# Patient Record
Sex: Female | Born: 1950 | Race: Black or African American | Hispanic: No | Marital: Single | Smoking: Former smoker
Health system: Southern US, Community
[De-identification: ages and names within clinical notes are randomized; demographics above are authoritative.]

## PROBLEM LIST (undated history)

## (undated) DIAGNOSIS — G459 Transient cerebral ischemic attack, unspecified: Secondary | ICD-10-CM

## (undated) DIAGNOSIS — E785 Hyperlipidemia, unspecified: Secondary | ICD-10-CM

## (undated) DIAGNOSIS — M722 Plantar fascial fibromatosis: Secondary | ICD-10-CM

## (undated) DIAGNOSIS — J8410 Pulmonary fibrosis, unspecified: Secondary | ICD-10-CM

## (undated) DIAGNOSIS — E119 Type 2 diabetes mellitus without complications: Secondary | ICD-10-CM

## (undated) DIAGNOSIS — I10 Essential (primary) hypertension: Secondary | ICD-10-CM

## (undated) DIAGNOSIS — Z860101 Personal history of adenomatous and serrated colon polyps: Secondary | ICD-10-CM

## (undated) DIAGNOSIS — R0600 Dyspnea, unspecified: Secondary | ICD-10-CM

## (undated) DIAGNOSIS — K219 Gastro-esophageal reflux disease without esophagitis: Secondary | ICD-10-CM

## (undated) DIAGNOSIS — G4730 Sleep apnea, unspecified: Secondary | ICD-10-CM

## (undated) DIAGNOSIS — N189 Chronic kidney disease, unspecified: Secondary | ICD-10-CM

## (undated) DIAGNOSIS — G609 Hereditary and idiopathic neuropathy, unspecified: Secondary | ICD-10-CM

## (undated) DIAGNOSIS — J449 Chronic obstructive pulmonary disease, unspecified: Secondary | ICD-10-CM

## (undated) DIAGNOSIS — R011 Cardiac murmur, unspecified: Secondary | ICD-10-CM

## (undated) DIAGNOSIS — M810 Age-related osteoporosis without current pathological fracture: Secondary | ICD-10-CM

## (undated) DIAGNOSIS — Z9289 Personal history of other medical treatment: Secondary | ICD-10-CM

## (undated) DIAGNOSIS — J45909 Unspecified asthma, uncomplicated: Secondary | ICD-10-CM

## (undated) DIAGNOSIS — Z86010 Personal history of colonic polyps: Secondary | ICD-10-CM

## (undated) DIAGNOSIS — D649 Anemia, unspecified: Secondary | ICD-10-CM

## (undated) HISTORY — PX: TUBAL LIGATION: SHX77

## (undated) HISTORY — PX: BLADDER SUSPENSION: SHX72

## (undated) HISTORY — PX: VEIN LIGATION AND STRIPPING: SHX2653

## (undated) HISTORY — PX: COLONOSCOPY: SHX174

## (undated) HISTORY — PX: BACK SURGERY: SHX140

## (undated) HISTORY — PX: ESOPHAGOGASTRODUODENOSCOPY: SHX1529

## (undated) HISTORY — PX: ABDOMINAL HYSTERECTOMY: SHX81

## (undated) HISTORY — PX: DILATION AND CURETTAGE OF UTERUS: SHX78

---

## 2001-07-17 ENCOUNTER — Encounter: Payer: Self-pay | Admitting: Emergency Medicine

## 2001-07-17 ENCOUNTER — Emergency Department (HOSPITAL_COMMUNITY): Admission: EM | Admit: 2001-07-17 | Discharge: 2001-07-17 | Payer: Self-pay | Admitting: Emergency Medicine

## 2004-02-26 ENCOUNTER — Other Ambulatory Visit: Payer: Self-pay

## 2004-04-02 ENCOUNTER — Other Ambulatory Visit: Payer: Self-pay

## 2004-07-22 ENCOUNTER — Emergency Department: Payer: Self-pay | Admitting: Emergency Medicine

## 2004-08-30 ENCOUNTER — Ambulatory Visit: Payer: Self-pay

## 2004-09-20 ENCOUNTER — Emergency Department: Payer: Self-pay | Admitting: Internal Medicine

## 2004-10-24 ENCOUNTER — Emergency Department: Payer: Self-pay | Admitting: Emergency Medicine

## 2004-11-20 ENCOUNTER — Emergency Department: Payer: Self-pay | Admitting: General Practice

## 2005-05-08 ENCOUNTER — Emergency Department: Payer: Self-pay | Admitting: Emergency Medicine

## 2005-05-19 ENCOUNTER — Emergency Department: Payer: Self-pay | Admitting: Unknown Physician Specialty

## 2005-06-24 ENCOUNTER — Inpatient Hospital Stay: Payer: Self-pay

## 2005-06-24 ENCOUNTER — Other Ambulatory Visit: Payer: Self-pay

## 2005-07-01 ENCOUNTER — Emergency Department: Payer: Self-pay | Admitting: Emergency Medicine

## 2005-07-02 ENCOUNTER — Other Ambulatory Visit: Payer: Self-pay

## 2005-07-02 ENCOUNTER — Emergency Department: Payer: Self-pay | Admitting: Emergency Medicine

## 2005-07-10 ENCOUNTER — Other Ambulatory Visit: Payer: Self-pay

## 2005-07-11 ENCOUNTER — Observation Stay: Payer: Self-pay | Admitting: Family Medicine

## 2005-08-24 ENCOUNTER — Emergency Department: Payer: Self-pay | Admitting: Emergency Medicine

## 2005-11-16 ENCOUNTER — Emergency Department: Payer: Self-pay | Admitting: Internal Medicine

## 2006-04-11 ENCOUNTER — Other Ambulatory Visit: Payer: Self-pay

## 2006-04-11 ENCOUNTER — Emergency Department: Payer: Self-pay | Admitting: Emergency Medicine

## 2006-05-17 ENCOUNTER — Emergency Department: Payer: Self-pay | Admitting: Emergency Medicine

## 2006-08-19 ENCOUNTER — Other Ambulatory Visit: Payer: Self-pay

## 2006-08-19 ENCOUNTER — Emergency Department: Payer: Self-pay | Admitting: Emergency Medicine

## 2007-01-01 ENCOUNTER — Emergency Department: Payer: Self-pay | Admitting: Emergency Medicine

## 2007-03-16 ENCOUNTER — Ambulatory Visit: Payer: Self-pay | Admitting: Specialist

## 2007-04-15 ENCOUNTER — Emergency Department: Payer: Self-pay | Admitting: Emergency Medicine

## 2007-05-10 ENCOUNTER — Other Ambulatory Visit: Payer: Self-pay

## 2007-05-10 ENCOUNTER — Emergency Department: Payer: Self-pay | Admitting: Emergency Medicine

## 2007-06-05 ENCOUNTER — Emergency Department: Payer: Self-pay | Admitting: Internal Medicine

## 2007-06-30 ENCOUNTER — Emergency Department: Payer: Self-pay | Admitting: Emergency Medicine

## 2007-09-03 ENCOUNTER — Ambulatory Visit: Payer: Self-pay | Admitting: Internal Medicine

## 2007-10-14 ENCOUNTER — Emergency Department: Payer: Self-pay | Admitting: Emergency Medicine

## 2007-10-14 ENCOUNTER — Other Ambulatory Visit: Payer: Self-pay

## 2008-01-11 ENCOUNTER — Other Ambulatory Visit: Payer: Self-pay

## 2008-01-11 ENCOUNTER — Emergency Department: Payer: Self-pay | Admitting: Unknown Physician Specialty

## 2008-01-23 ENCOUNTER — Emergency Department: Payer: Self-pay | Admitting: Emergency Medicine

## 2008-04-08 ENCOUNTER — Ambulatory Visit: Payer: Self-pay | Admitting: Unknown Physician Specialty

## 2008-04-19 ENCOUNTER — Ambulatory Visit: Payer: Self-pay | Admitting: Unknown Physician Specialty

## 2008-05-06 ENCOUNTER — Emergency Department: Payer: Self-pay | Admitting: Emergency Medicine

## 2009-03-28 ENCOUNTER — Ambulatory Visit: Payer: Self-pay | Admitting: Internal Medicine

## 2009-06-22 ENCOUNTER — Emergency Department: Payer: Self-pay | Admitting: Emergency Medicine

## 2009-08-01 ENCOUNTER — Ambulatory Visit: Payer: Self-pay | Admitting: Specialist

## 2009-10-24 ENCOUNTER — Ambulatory Visit: Payer: Self-pay | Admitting: Internal Medicine

## 2010-02-20 ENCOUNTER — Emergency Department: Payer: Self-pay | Admitting: Emergency Medicine

## 2010-03-02 ENCOUNTER — Emergency Department: Payer: Self-pay | Admitting: Emergency Medicine

## 2010-03-17 ENCOUNTER — Emergency Department: Payer: Self-pay | Admitting: Unknown Physician Specialty

## 2010-04-17 ENCOUNTER — Ambulatory Visit: Payer: Self-pay | Admitting: Internal Medicine

## 2011-01-10 ENCOUNTER — Emergency Department: Payer: Self-pay | Admitting: Emergency Medicine

## 2011-01-25 ENCOUNTER — Emergency Department: Payer: Self-pay | Admitting: Internal Medicine

## 2011-04-20 ENCOUNTER — Ambulatory Visit: Payer: Self-pay | Admitting: Internal Medicine

## 2011-06-19 ENCOUNTER — Ambulatory Visit: Payer: Self-pay | Admitting: Internal Medicine

## 2011-06-25 ENCOUNTER — Emergency Department: Payer: Self-pay | Admitting: Emergency Medicine

## 2011-12-10 ENCOUNTER — Emergency Department: Payer: Self-pay | Admitting: Emergency Medicine

## 2011-12-10 LAB — BASIC METABOLIC PANEL
Anion Gap: 13 (ref 7–16)
BUN: 31 mg/dL — ABNORMAL HIGH (ref 7–18)
Calcium, Total: 9.4 mg/dL (ref 8.5–10.1)
Chloride: 103 mmol/L (ref 98–107)
Co2: 26 mmol/L (ref 21–32)
Creatinine: 1.04 mg/dL (ref 0.60–1.30)
EGFR (African American): >60
EGFR (Non-African Amer.): 58 — ABNORMAL LOW
Glucose: 61 mg/dL — ABNORMAL LOW (ref 65–99)
Osmolality: 288 (ref 275–301)
Potassium: 3.4 mmol/L — ABNORMAL LOW (ref 3.5–5.1)
Sodium: 142 mmol/L (ref 136–145)

## 2011-12-10 LAB — CBC WITH DIFFERENTIAL/PLATELET
Basophil #: 0.1 10*3/uL (ref 0.0–0.1)
Basophil %: 0.6 %
Eosinophil #: 0 10*3/uL (ref 0.0–0.7)
Eosinophil %: 0.3 %
HCT: 41.5 % (ref 35.0–47.0)
HGB: 13.4 g/dL (ref 12.0–16.0)
Lymphocyte #: 2.8 10*3/uL (ref 1.0–3.6)
Lymphocyte %: 21.6 %
MCH: 29.9 pg (ref 26.0–34.0)
MCHC: 32.4 g/dL (ref 32.0–36.0)
MCV: 92 fL (ref 80–100)
Monocyte #: 0.8 10*3/uL — ABNORMAL HIGH (ref 0.0–0.7)
Monocyte %: 5.8 %
Neutrophil #: 9.3 10*3/uL — ABNORMAL HIGH (ref 1.4–6.5)
Neutrophil %: 71.7 %
Platelet: 226 10*3/uL (ref 150–440)
RBC: 4.5 10*6/uL (ref 3.80–5.20)
RDW: 14.4 % (ref 11.5–14.5)
WBC: 13 10*3/uL — ABNORMAL HIGH (ref 3.6–11.0)

## 2012-04-23 ENCOUNTER — Emergency Department: Payer: Self-pay | Admitting: Internal Medicine

## 2013-05-05 ENCOUNTER — Ambulatory Visit: Payer: Self-pay | Admitting: Internal Medicine

## 2013-06-30 ENCOUNTER — Emergency Department: Payer: Self-pay | Admitting: Internal Medicine

## 2014-03-22 DIAGNOSIS — E785 Hyperlipidemia, unspecified: Secondary | ICD-10-CM | POA: Insufficient documentation

## 2014-03-22 DIAGNOSIS — E1129 Type 2 diabetes mellitus with other diabetic kidney complication: Secondary | ICD-10-CM | POA: Insufficient documentation

## 2014-05-17 ENCOUNTER — Ambulatory Visit: Payer: Self-pay | Admitting: Internal Medicine

## 2014-09-08 DIAGNOSIS — R0600 Dyspnea, unspecified: Secondary | ICD-10-CM | POA: Insufficient documentation

## 2014-09-08 DIAGNOSIS — Z86010 Personal history of colonic polyps: Secondary | ICD-10-CM | POA: Insufficient documentation

## 2014-09-08 DIAGNOSIS — R0609 Other forms of dyspnea: Secondary | ICD-10-CM | POA: Insufficient documentation

## 2014-09-14 DIAGNOSIS — I350 Nonrheumatic aortic (valve) stenosis: Secondary | ICD-10-CM | POA: Insufficient documentation

## 2014-10-06 DIAGNOSIS — Z6841 Body Mass Index (BMI) 40.0 and over, adult: Secondary | ICD-10-CM | POA: Insufficient documentation

## 2014-12-14 ENCOUNTER — Ambulatory Visit: Payer: Self-pay | Admitting: Internal Medicine

## 2014-12-14 DIAGNOSIS — K219 Gastro-esophageal reflux disease without esophagitis: Secondary | ICD-10-CM | POA: Insufficient documentation

## 2014-12-29 ENCOUNTER — Ambulatory Visit: Payer: Self-pay | Admitting: Unknown Physician Specialty

## 2015-01-20 ENCOUNTER — Ambulatory Visit
Admit: 2015-01-20 | Disposition: A | Discharge status: Home / Self Care | Payer: Self-pay | Attending: Bariatrics | Admitting: Bariatrics

## 2015-01-21 ENCOUNTER — Ambulatory Visit
Admit: 2015-01-21 | Disposition: A | Discharge status: Home / Self Care | Payer: Self-pay | Attending: General Practice | Admitting: General Practice

## 2015-01-21 DIAGNOSIS — Z01818 Encounter for other preprocedural examination: Secondary | ICD-10-CM

## 2015-01-31 LAB — SURGICAL PATHOLOGY

## 2015-02-01 ENCOUNTER — Ambulatory Visit: Admit: 2015-02-01 | Disposition: A | Discharge status: Home / Self Care | Payer: Self-pay | Admitting: Bariatrics

## 2015-02-28 DIAGNOSIS — R7989 Other specified abnormal findings of blood chemistry: Secondary | ICD-10-CM | POA: Insufficient documentation

## 2015-04-20 ENCOUNTER — Encounter: Payer: Self-pay | Admitting: Emergency Medicine

## 2015-04-20 ENCOUNTER — Emergency Department
Admission: EM | Admit: 2015-04-20 | Discharge: 2015-04-20 | Disposition: A | Discharge status: Home / Self Care | Payer: Medicare Other | Attending: Emergency Medicine | Admitting: Emergency Medicine

## 2015-04-20 DIAGNOSIS — E119 Type 2 diabetes mellitus without complications: Secondary | ICD-10-CM | POA: Diagnosis not present

## 2015-04-20 DIAGNOSIS — I10 Essential (primary) hypertension: Secondary | ICD-10-CM | POA: Diagnosis not present

## 2015-04-20 DIAGNOSIS — M5410 Radiculopathy, site unspecified: Secondary | ICD-10-CM | POA: Insufficient documentation

## 2015-04-20 DIAGNOSIS — Z87891 Personal history of nicotine dependence: Secondary | ICD-10-CM | POA: Insufficient documentation

## 2015-04-20 DIAGNOSIS — M545 Low back pain: Secondary | ICD-10-CM | POA: Diagnosis present

## 2015-04-20 HISTORY — DX: Type 2 diabetes mellitus without complications: E11.9

## 2015-04-20 HISTORY — DX: Essential (primary) hypertension: I10

## 2015-04-20 HISTORY — DX: Unspecified asthma, uncomplicated: J45.909

## 2015-04-20 HISTORY — DX: Gastro-esophageal reflux disease without esophagitis: K21.9

## 2015-04-20 LAB — GLUCOSE, CAPILLARY: Glucose-Capillary: 163 mg/dL — ABNORMAL HIGH (ref 65–99)

## 2015-04-20 MED ORDER — PREDNISONE 10 MG PO TABS: ORAL_TABLET | ORAL | Status: DC

## 2015-04-20 NOTE — Discharge Instructions (Signed)
° °  FOLLOW UP WITH YOUR DOCTOR IF ANY CONTINUED PROBLEMS  KEEP YOUR APPOINTMENT WITH DR. Marland Kitchen

## 2015-04-20 NOTE — ED Notes (Signed)
C/o back pain and stomach pain, stomach pain comes and goes, back has been hurting for several weeks.

## 2015-04-20 NOTE — ED Provider Notes (Signed)
[facility], [place] Emergency Department Provider Note  ____________________________________________  Time seen: 1427  I have reviewed the triage vital signs and the nursing notes.   HISTORY  Chief Complaint Back Pain   HPI [name] [name] is a 64 y.o. female is here today with complaint of low back pain. She states this is been hurting for several weeks. The pain is now going down her right leg. She denies any bowel or bladder difficulties.She's had problems in the past with her back and apparently went for an MRI. She states that she is unaware of what the MRI showed that she knows she is has an appointment scheduled with Dr. Marland Kitchen for cortisone injections in her back. Currently she takes prednisone 10 mg daily for her asthma. She states that for the last several days she is increased her prednisone to 20 mg hoping that it would help with pain. Standing and walking increases her pain down her right leg. Nothing seems to be helping.   Past Medical History  Diagnosis Date  . Hypertension   . Diabetes mellitus without complication   . GERD (gastroesophageal reflux disease)   . Asthma     There are no active problems to display for this patient.   Past Surgical History  Procedure Laterality Date  . Abdominal hysterectomy    . Cesarean section      Current Outpatient Rx  Name  Route  Sig  Dispense  Refill  . predniSONE (DELTASONE) 10 MG tablet      Take 6 tablets  today, on day 2 take 5 tablets, day 3 take 4 tablets, day 4 take 3 tablets, day 5 take  2 tablets and 1 tablet the last day   21 tablet   0     Allergies Codeine  History reviewed. No pertinent family history.  Social History History  Substance Use Topics  . Smoking status: Former Research scientist (life sciences)  . Smokeless tobacco: Not on file  . Alcohol Use: No    Review of Systems Constitutional: No fever/chills Cardiovascular: Denies chest pain. Respiratory: Denies shortness of  breath. Gastrointestinal: No abdominal pain.  No nausea, no vomiting. Genitourinary: Negative for dysuria. Musculoskeletal: Positive for back pain. Skin: Negative for rash. Neurological: Negative for headaches, no focal weaknes , positive numbness radiating down right leg.  10-point ROS otherwise negative.  ____________________________________________   PHYSICAL EXAM:  VITAL SIGNS: ED Triage Vitals  Enc Vitals Group     BP 04/20/15 1337 142/78 mmHg     Pulse Rate 04/20/15 1337 95     Resp 04/20/15 1337 18     Temp 04/20/15 1337 97.7 F (36.5 C)     Temp Source 04/20/15 1337 Oral     SpO2 04/20/15 1337 100 %     Weight 04/20/15 1337 223 lb (101.152 kg)     Height 04/20/15 1337 4\' 11"  (1.499 m)     Head Cir --      Peak Flow --      Pain Score --      Pain Loc --      Pain Edu? --      Excl. in [place]? --     Constitutional: Alert and oriented. Well appearing and in no acute distress. Eyes: Conjunctivae are normal. PERRL. EOMI. Head: Atraumatic. Nose: No congestion/rhinnorhea. Neck: No stridor.   Cardiovascular: Normal rate, regular rhythm. Grossly normal heart sounds.  Good peripheral circulation. Respiratory: Normal respiratory effort.  No retractions. Lungs CTAB. Gastrointestinal: Soft and nontender. No  distention. No CVA tenderness. Musculoskeletal: No lower extremity tenderness nor edema.  No joint effusions. Neurologic:  Normal speech and language. No gross focal neurologic deficits are appreciated. No gait instability. Skin:  Skin is warm, dry and intact. No rash noted. Psychiatric: Mood and affect are normal. Speech and behavior are normal.  ____________________________________________   LABS (all labs ordered are listed, but only abnormal results are displayed)  Labs Reviewed  GLUCOSE, CAPILLARY - Abnormal; Notable for the following:    Glucose-Capillary 163 (*)    All other components within normal limits  CBG MONITORING, ED      PROCEDURES  Procedure(s) performed: None  Critical Care performed: No  ____________________________________________   INITIAL IMPRESSION / ASSESSMENT AND PLAN / ED COURSE  Pertinent labs & imaging results that were available during my care of the patient were reviewed by me and considered in my medical decision making (see chart for details).  Patient is a very taking prednisone 20 mg we discussed increasing her to 60 mg with a tapering dose over the next 6 days. She is to call her doctor if any continued problems. She is also to keep her appointment with Dr. Marland Kitchen ____________________________________________   FINAL CLINICAL IMPRESSION(S) / ED DIAGNOSES  Final diagnoses:  Sciatica neuralgia, right      Johnn Hai, PA-C 04/20/15 1546  Delman Kitten, MD 04/20/15 1558

## 2015-04-20 NOTE — ED Notes (Signed)
C/o right low back pain past few weeks

## 2015-05-01 ENCOUNTER — Encounter: Payer: Self-pay | Admitting: *Deleted

## 2015-05-01 ENCOUNTER — Emergency Department
Admission: EM | Admit: 2015-05-01 | Discharge: 2015-05-01 | Disposition: A | Discharge status: Home / Self Care | Payer: Medicare Other | Attending: Emergency Medicine | Admitting: Emergency Medicine

## 2015-05-01 ENCOUNTER — Emergency Department: Payer: Medicare Other

## 2015-05-01 DIAGNOSIS — I10 Essential (primary) hypertension: Secondary | ICD-10-CM | POA: Diagnosis not present

## 2015-05-01 DIAGNOSIS — E119 Type 2 diabetes mellitus without complications: Secondary | ICD-10-CM | POA: Insufficient documentation

## 2015-05-01 DIAGNOSIS — M25552 Pain in left hip: Secondary | ICD-10-CM

## 2015-05-01 DIAGNOSIS — Z87891 Personal history of nicotine dependence: Secondary | ICD-10-CM | POA: Diagnosis not present

## 2015-05-01 MED ORDER — OXYCODONE-ACETAMINOPHEN 5-325 MG PO TABS: 1.0000 | ORAL_TABLET | Freq: Four times a day (QID) | ORAL | Status: DC | PRN

## 2015-05-01 MED ORDER — PREDNISONE 10 MG (21) PO TBPK: ORAL_TABLET | ORAL | Status: DC

## 2015-05-01 NOTE — ED Notes (Signed)
Pt points to left hip and says she has a lot of pain there. Pt denies injury. Pt also states it hurts a lot when she reaches to wipe herself.

## 2015-05-01 NOTE — Discharge Instructions (Signed)
Hip Pain Your hip is the joint between your upper legs and your lower pelvis. The bones, cartilage, tendons, and muscles of your hip joint perform a lot of work each day supporting your body weight and allowing you to move around. Hip pain can range from a minor ache to severe pain in one or both of your hips. Pain may be felt on the inside of the hip joint near the groin, or the outside near the buttocks and upper thigh. You may have swelling or stiffness as well.  HOME CARE INSTRUCTIONS   Take medicines only as directed by your health care provider.  Apply ice to the injured area:  Put ice in a plastic bag.  Place a towel between your skin and the bag.  Leave the ice on for 15-20 minutes at a time, 3-4 times a day.  Keep your leg raised (elevated) when possible to lessen swelling.  Avoid activities that cause pain.  Follow specific exercises as directed by your health care provider.  Sleep with a pillow between your legs on your most comfortable side.  Record how often you have hip pain, the location of the pain, and what it feels like. SEEK MEDICAL CARE IF:   You are unable to put weight on your leg.  Your hip is red or swollen or very tender to touch.  Your pain or swelling continues or worsens after 1 week.  You have increasing difficulty walking.  You have a fever. SEEK IMMEDIATE MEDICAL CARE IF:   You have fallen.  You have a sudden increase in pain and swelling in your hip. MAKE SURE YOU:   Understand these instructions.  Will watch your condition.  Will get help right away if you are not doing well or get worse. Document Released: 03/14/2010 Document Revised: 02/08/2014 Document Reviewed: 05/21/2013 [facility] Patient Information 2015 [place], [place]. This information is not intended to replace advice given to you by your health care provider. Make sure you discuss any questions you have with your health care provider.   Take prednisone and pain medicine as  directed. Contact your physician to schedule follow-up appointment. Return for any worsening symptoms.

## 2015-05-01 NOTE — ED Notes (Signed)
Pt also states at times she has a lot of pain in left leg from thigh to ankle/.

## 2015-05-01 NOTE — ED Provider Notes (Signed)
[facility] Emergency Department Provider Note  ____________________________________________  Time seen: Approximately 11:40 AM  I have reviewed the triage vital signs and the nursing notes.   HISTORY  Chief Complaint Hip Pain    HPI [name] [name] is a 64 y.o. female with history of asthma, chronic prednisone use who presents with left groin pain worse last couple days. She has history of degenerative changes in the spine and is followed by a back specialist. She was recently seen in the emergency department, and treated for sciatica with improvement. She took 30 mg of prednisone yesterday and 20mg  today for her pain. Pain is worse with movement. No new leg swelling. No nausea or vomiting, abdominal pain. She reports regular bowel movements.   Past Medical History  Diagnosis Date  . Hypertension   . Diabetes mellitus without complication   . GERD (gastroesophageal reflux disease)   . Asthma     There are no active problems to display for this patient.   Past Surgical History  Procedure Laterality Date  . Abdominal hysterectomy    . Cesarean section      Current Outpatient Rx  Name  Route  Sig  Dispense  Refill  . predniSONE (DELTASONE) 10 MG tablet      Take 6 tablets  today, on day 2 take 5 tablets, day 3 take 4 tablets, day 4 take 3 tablets, day 5 take  2 tablets and 1 tablet the last day   21 tablet   0     Allergies Codeine  No family history on file.  Social History History  Substance Use Topics  . Smoking status: Former Research scientist (life sciences)  . Smokeless tobacco: Not on file  . Alcohol Use: No    Review of Systems Constitutional: No fever/chills Eyes: No visual changes. ENT: No sore throat. Cardiovascular: Denies chest pain. Respiratory: Denies shortness of breath. Gastrointestinal: No abdominal pain.  No nausea, no vomiting.  No diarrhea.  No constipation. Genitourinary: Negative for dysuria. Musculoskeletal: back pain with radiation  to the right foot. Skin: Negative for rash. Neurological: Negative for headaches, focal weakness or numbness.  10-point ROS otherwise negative.  ____________________________________________   PHYSICAL EXAM:  VITAL SIGNS: ED Triage Vitals  Enc Vitals Group     BP 05/01/15 0948 150/95 mmHg     Pulse Rate 05/01/15 0948 83     Resp 05/01/15 0948 20     Temp 05/01/15 0948 98 F (36.7 C)     Temp Source 05/01/15 0948 Oral     SpO2 05/01/15 0948 98 %     Weight 05/01/15 0948 224 lb (101.606 kg)     Height 05/01/15 0948 4\' 11"  (1.499 m)     Head Cir --      Peak Flow --      Pain Score 05/01/15 0950 10     Pain Loc --      Pain Edu? --      Excl. in [place]? --     Constitutional: Alert and oriented. Well appearing and in no acute distress. Eyes: Conjunctivae are normal. PERRL. EOMI. Head: Atraumatic. Nose: No congestion/rhinnorhea. Mouth/Throat: Mucous membranes are moist.  Oropharynx non-erythematous. Neck: supple Cardiovascular: Normal rate, regular rhythm. Grossly normal heart sounds.  Good peripheral circulation. Respiratory: Normal respiratory effort.  No retractions. Lungs CTAB. Gastrointestinal: Soft and nontender. No distention. No abdominal bruits. No CVA tenderness. Musculoskeletal: No lower extremity tenderness nor edema.  No joint effusions. Tender to the left trochanteric bursa, and  left groin with pain on external rotation. Negative/positive straight leg raise on the left Extremities no edema. 2+ DP pulses Neurologic:  Normal speech and language. No gross focal neurologic deficits are appreciated. No gait instability. Skin:  Skin is warm, dry and intact. No rash noted. Psychiatric: Mood and affect are normal. Speech and behavior are normal.  ____________________________________________   LABS (all labs ordered are listed, but only abnormal results are displayed)  Labs Reviewed - No data to  display ____________________________________________  EKG   ____________________________________________  RADIOLOGY      CLINICAL DATA: Sharp left hip pain with weight-bearing for 3 days. No known injury. Initial encounter.  EXAM: DG HIP (WITH OR WITHOUT PELVIS) 1V*L*  COMPARISON: None.  FINDINGS: No acute bony or joint abnormality is identified. There is no evidence of femoral head avascular necrosis. Lower lumbar spondylosis is noted. The sacroiliac joints and symphysis pubis are unremarkable.  IMPRESSION: Normal-appearing left hip.  Lower lumbar spondylosis.   Electronically Signed  By: Inge Rise M.D.  On: 05/01/2015 11:38    ____________________________________________   PROCEDURES  Procedure(s) performed: None  Critical Care performed: No  ____________________________________________   INITIAL IMPRESSION / ASSESSMENT AND PLAN / ED COURSE  Pertinent labs & imaging results that were available during my care of the patient were reviewed by me and considered in my medical decision making (see chart for details).  64 year old with known lumbar degenerative disease who presents with left hip pain. X-ray does not suggest avascular necrosis. Consider left groin strain vs lumbar radiculopathy vs trochanteric bursitis. She will begin a 60 mg prednisone taper and contact her physician for follow-up. She also sees Dr. Sharlet Salina in physiatry for her degenerative lumbar disease. Return to the emergency room for any worsening symptoms. She is also given Percocet, #20 ____________________________________________   FINAL CLINICAL IMPRESSION(S) / ED DIAGNOSES  Final diagnoses:  None      Mortimer Fries, PA-C 05/01/15 [place], MD 05/01/15 [phone]

## 2015-05-01 NOTE — ED Notes (Signed)
NAD noted at time of D/C. Pt ambulatory to the lobby at this time. Pt refused wheelchair.

## 2015-05-24 ENCOUNTER — Emergency Department
Admission: EM | Admit: 2015-05-24 | Discharge: 2015-05-24 | Disposition: A | Discharge status: Home / Self Care | Payer: Medicare Other | Attending: Emergency Medicine | Admitting: Emergency Medicine

## 2015-05-24 ENCOUNTER — Emergency Department: Payer: Medicare Other

## 2015-05-24 ENCOUNTER — Encounter: Payer: Self-pay | Admitting: Emergency Medicine

## 2015-05-24 DIAGNOSIS — I10 Essential (primary) hypertension: Secondary | ICD-10-CM | POA: Insufficient documentation

## 2015-05-24 DIAGNOSIS — M79605 Pain in left leg: Secondary | ICD-10-CM | POA: Diagnosis not present

## 2015-05-24 DIAGNOSIS — M25551 Pain in right hip: Secondary | ICD-10-CM | POA: Diagnosis present

## 2015-05-24 DIAGNOSIS — E119 Type 2 diabetes mellitus without complications: Secondary | ICD-10-CM | POA: Diagnosis not present

## 2015-05-24 DIAGNOSIS — M545 Low back pain, unspecified: Secondary | ICD-10-CM

## 2015-05-24 DIAGNOSIS — M25552 Pain in left hip: Secondary | ICD-10-CM | POA: Insufficient documentation

## 2015-05-24 DIAGNOSIS — M79604 Pain in right leg: Secondary | ICD-10-CM | POA: Insufficient documentation

## 2015-05-24 DIAGNOSIS — M5136 Other intervertebral disc degeneration, lumbar region: Secondary | ICD-10-CM | POA: Insufficient documentation

## 2015-05-24 LAB — COMPREHENSIVE METABOLIC PANEL
ALBUMIN: 3.6 g/dL (ref 3.5–5.0)
ALK PHOS: 65 U/L (ref 38–126)
ALT: 19 U/L (ref 14–54)
AST: 28 U/L (ref 15–41)
Anion gap: 8 (ref 5–15)
BUN: 21 mg/dL — AB (ref 6–20)
CO2: 29 mmol/L (ref 22–32)
CREATININE: 1.38 mg/dL — AB (ref 0.44–1.00)
Calcium: 8.9 mg/dL (ref 8.9–10.3)
Chloride: 105 mmol/L (ref 101–111)
GFR calc Af Amer: 47 mL/min — ABNORMAL LOW (ref >60)
GFR, EST NON AFRICAN AMERICAN: 40 mL/min — AB (ref >60)
GLUCOSE: 193 mg/dL — AB (ref 65–99)
Potassium: 3.7 mmol/L (ref 3.5–5.1)
Sodium: 142 mmol/L (ref 135–145)
Total Bilirubin: 0.6 mg/dL (ref 0.3–1.2)
Total Protein: 6.7 g/dL (ref 6.5–8.1)

## 2015-05-24 LAB — CBC WITH DIFFERENTIAL/PLATELET
BASOS ABS: 0.1 10*3/uL (ref 0–0.1)
Basophils Relative: 1 %
EOS ABS: 0.1 10*3/uL (ref 0–0.7)
HCT: 43.5 % (ref 35.0–47.0)
Hemoglobin: 14.4 g/dL (ref 12.0–16.0)
Lymphs Abs: 1.4 10*3/uL (ref 1.0–3.6)
MCH: 29.8 pg (ref 26.0–34.0)
MCHC: 33 g/dL (ref 32.0–36.0)
MCV: 90.1 fL (ref 80.0–100.0)
MONO ABS: 0.7 10*3/uL (ref 0.2–0.9)
Monocytes Relative: 8 %
Neutro Abs: 6 10*3/uL (ref 1.4–6.5)
Neutrophils Relative %: 72 %
PLATELETS: 190 10*3/uL (ref 150–440)
RBC: 4.83 MIL/uL (ref 3.80–5.20)
RDW: 15.3 % — AB (ref 11.5–14.5)
WBC: 8.3 10*3/uL (ref 3.6–11.0)

## 2015-05-24 MED ORDER — DIAZEPAM 5 MG PO TABS: 5.0000 mg | ORAL_TABLET | Freq: Three times a day (TID) | ORAL | Status: AC | PRN

## 2015-05-24 MED ORDER — GABAPENTIN 100 MG PO CAPS: 100.0000 mg | ORAL_CAPSULE | Freq: Three times a day (TID) | ORAL | Status: DC

## 2015-05-24 NOTE — ED Provider Notes (Signed)
[facility] Emergency Department Provider Note  ____________________________________________  Time seen: Approximately 10:23 AM  I have reviewed the triage vital signs and the nursing notes.   HISTORY  Chief Complaint Leg Pain    HPI [name] [name] is a 64 y.o. female who presents for continued bilateral hip and leg pain for several months. Patient states that her legs just hurt all the time she continued same without answers. Her PCP does not appear to be helping her according to her report. She has appointment at [facility] on 29th of September for evaluation. Wants [place] to figure out what's wrong with her.   Past Medical History  Diagnosis Date  . Hypertension   . Diabetes mellitus without complication   . GERD (gastroesophageal reflux disease)   . Asthma     There are no active problems to display for this patient.   Past Surgical History  Procedure Laterality Date  . Abdominal hysterectomy    . Cesarean section      Current Outpatient Rx  Name  Route  Sig  Dispense  Refill  . diazepam (VALIUM) 5 MG tablet   Oral   Take 1 tablet (5 mg total) by mouth every 8 (eight) hours as needed for muscle spasms.   14 tablet   0   . gabapentin (NEURONTIN) 100 MG capsule   Oral   Take 1 capsule (100 mg total) by mouth 3 (three) times daily. Maximum 3 pills 3 times daily.   90 capsule   0   . oxyCODONE-acetaminophen (ROXICET) 5-325 MG per tablet   Oral   Take 1 tablet by mouth every 6 (six) hours as needed.   20 tablet   0   . predniSONE (STERAPRED UNI-PAK 21 TAB) 10 MG (21) TBPK tablet      6 tablets on day 1, 5 tablets on day 2, 4 tablets on day 3, etc...   21 tablet   0     Allergies Codeine  History reviewed. No pertinent family history.  Social History Social History  Substance Use Topics  . Smoking status: Former Research scientist (life sciences)  . Smokeless tobacco: None  . Alcohol Use: No    Review of Systems Constitutional: No fever/chills Eyes: No  visual changes. ENT: No sore throat. Cardiovascular: Denies chest pain. Respiratory: Denies shortness of breath. Gastrointestinal: No abdominal pain.  No nausea, no vomiting.  No diarrhea.  No constipation. Genitourinary: Negative for dysuria. Musculoskeletal: Positive for low back pain. Positive for tenderness to the entire legs bilaterally from hip to toes. States it hurts to touch both sides. Patient is sensitive to even light palpation. Distal pulses neurovascularly intact capillary refills good on both feet. Straight leg positive subjectively. Skin: Negative for rash. Neurological: Negative for headaches, focal weakness or numbness.  10-point ROS otherwise negative.  ____________________________________________   PHYSICAL EXAM:  VITAL SIGNS: ED Triage Vitals  Enc Vitals Group     BP 05/24/15 0950 130/80 mmHg     Pulse Rate 05/24/15 0950 95     Resp 05/24/15 0950 18     Temp 05/24/15 0950 97.8 F (36.6 C)     Temp Source 05/24/15 0950 Oral     SpO2 05/24/15 0950 98 %     Weight 05/24/15 0950 223 lb (101.152 kg)     Height 05/24/15 0950 4\' 11"  (1.499 m)     Head Cir --      Peak Flow --      Pain Score 05/24/15 0951 10  Pain Loc --      Pain Edu? --      Excl. in [place]? --     Constitutional: Alert and oriented. Well appearing and in no acute distress. Eyes: Conjunctivae are normal. PERRL. EOMI. Head: Atraumatic. Nose: No congestion/rhinnorhea. Mouth/Throat: Mucous membranes are moist.  Oropharynx non-erythematous. Neck: No stridor.   Cardiovascular: Normal rate, regular rhythm. Grossly normal heart sounds.  Good peripheral circulation. Respiratory: Normal respiratory effort.  No retractions. Lungs CTAB. Gastrointestinal: Soft anPositive for tenderness to the entire legs bilaterally from hip to toes. States it hurts to touch both sides. Patient is sensitive to even light palpation. Distal pulses neurovascularly intact capillary refills good on both feet. Straight leg  positive subjectively.d nontender. No distention. No abdominal bruits. No CVA tenderness. Musculoskeletal: No lower extremity tenderness nor edema.  No joint effusions. Neurologic:  Normal speech and language. No gross focal neurologic deficits are appreciated. No gait instability. Skin:  Skin is warm, dry and intact. No rash noted. Psychiatric: Mood and affect are normal. Speech and behavior are normal.  ____________________________________________   LABS (all labs ordered are listed, but only abnormal results are displayed)  Labs Reviewed  COMPREHENSIVE METABOLIC PANEL  CBC WITH DIFFERENTIAL/PLATELET   ____________________________________________  RADIOLOGY  CT lumbar spine severe degenerative disc disease ____________________________________________   PROCEDURES  Procedure(s) performed: None  Critical Care performed: No  ____________________________________________   INITIAL IMPRESSION / ASSESSMENT AND PLAN / ED COURSE  Pertinent labs & imaging results that were available during my care of the patient were reviewed by me and considered in my medical decision making (see chart for details).  Start patient on gabapentin for pain control and referred back to her PCP. She is encouraged to keep her appointment on September 29. She voices no other emergency medical complaints at this time ____________________________________________   FINAL CLINICAL IMPRESSION(S) / ED DIAGNOSES  Final diagnoses:  Low back pain at multiple sites  Degenerative disc disease, lumbar      Arlyss Repress, PA-C 05/24/15 1405  Delman Kitten, MD 05/25/15 2236

## 2015-05-24 NOTE — ED Notes (Signed)
Has chronic leg pain

## 2015-05-24 NOTE — ED Notes (Signed)
Pt to ed with c/o bilat hip and leg pain x several months,  Pt states her legs hurt all the time.  Pt reports she has been seen here for the same without answers.  Pt states appt at [facility] on Sept 29th for same.  Pt reports PMD is dr Ginette Pitman "but he don't do anything for me"  Pt states " I know something is wrong with my legs and I want ya'll to figure out what is wrong."

## 2015-05-24 NOTE — Discharge Instructions (Signed)
Chronic Back Pain  When back pain lasts longer than 3 months, it is called chronic back pain.People with chronic back pain often go through certain periods that are more intense (flare-ups).  CAUSES Chronic back pain can be caused by wear and tear (degeneration) on different structures in your back. These structures include:  The bones of your spine (vertebrae) and the joints surrounding your spinal cord and nerve roots (facets).  The strong, fibrous tissues that connect your vertebrae (ligaments). Degeneration of these structures may result in pressure on your nerves. This can lead to constant pain. HOME CARE INSTRUCTIONS  Avoid bending, heavy lifting, prolonged sitting, and activities which make the problem worse.  Take brief periods of rest throughout the day to reduce your pain. Lying down or standing usually is better than sitting while you are resting.  Take over-the-counter or prescription medicines only as directed by your caregiver. SEEK IMMEDIATE MEDICAL CARE IF:   You have weakness or numbness in one of your legs or feet.  You have trouble controlling your bladder or bowels.  You have nausea, vomiting, abdominal pain, shortness of breath, or fainting. Document Released: 11/01/2004 Document Revised: 12/17/2011 Document Reviewed: 09/08/2011 [facility] Patient Information 2015 [place], [place]. This information is not intended to replace advice given to you by your health care provider. Make sure you discuss any questions you have with your health care provider.  Degenerative Disk Disease Degenerative disk disease is a condition caused by the changes that occur in the cushions of the backbone (spinal disks) as you grow older. Spinal disks are soft and compressible disks located between the bones of the spine (vertebrae). They act like shock absorbers. Degenerative disk disease can affect the whole spine. However, the neck and lower back are most commonly affected. Many changes can  occur in the spinal disks with aging, such as:  The spinal disks may dry and shrink.  Small tears may occur in the tough, outer covering of the disk (annulus).  The disk space may become smaller due to loss of water.  Abnormal growths in the bone (spurs) may occur. This can put pressure on the nerve roots exiting the spinal canal, causing pain.  The spinal canal may become narrowed. CAUSES  Degenerative disk disease is a condition caused by the changes that occur in the spinal disks with aging. The exact cause is not known, but there is a genetic basis for many patients. Degenerative changes can occur due to loss of fluid in the disk. This makes the disk thinner and reduces the space between the backbones. Small cracks can develop in the outer layer of the disk. This can lead to the breakdown of the disk. You are more likely to get degenerative disk disease if you are overweight. Smoking cigarettes and doing heavy work such as weightlifting can also increase your risk of this condition. Degenerative changes can start after a sudden injury. Growth of bone spurs can compress the nerve roots and cause pain.  SYMPTOMS  The symptoms vary from person to person. Some people may have no pain, while others have severe pain. The pain may be so severe that it can limit your activities. The location of the pain depends on the part of your backbone that is affected. You will have neck or arm pain if a disk in the neck area is affected. You will have pain in your back, buttocks, or legs if a disk in the lower back is affected. The pain becomes worse while bending, reaching  up, or with twisting movements. The pain may start gradually and then get worse as time passes. It may also start after a major or minor injury. You may feel numbness or tingling in the arms or legs.  DIAGNOSIS  Your caregiver will ask you about your symptoms and about activities or habits that may cause the pain. He or she may also ask about  any injuries, diseases, or treatments you have had earlier. Your caregiver will examine you to check for the range of movement that is possible in the affected area, to check for strength in your extremities, and to check for sensation in the areas of the arms and legs supplied by different nerve roots. An X-ray of the spine may be taken. Your caregiver may suggest other imaging tests, such as magnetic resonance imaging (MRI), if needed.  TREATMENT  Treatment includes rest, modifying your activities, and applying ice and heat. Your caregiver may prescribe medicines to reduce your pain and may ask you to do some exercises to strengthen your back. In some cases, you may need surgery. You and your caregiver will decide on the treatment that is best for you. HOME CARE INSTRUCTIONS   Follow proper lifting and walking techniques as advised by your caregiver.  Maintain good posture.  Exercise regularly as advised.  Perform relaxation exercises.  Change your sitting, standing, and sleeping habits as advised. Change positions frequently.  Lose weight as advised.  Stop smoking if you smoke.  Wear supportive footwear. SEEK MEDICAL CARE IF:  Your pain does not go away within 1 to 4 weeks. SEEK IMMEDIATE MEDICAL CARE IF:   Your pain is severe.  You notice weakness in your arms, hands, or legs.  You begin to lose control of your bladder or bowel movements. MAKE SURE YOU:   Understand these instructions.  Will watch your condition.  Will get help right away if you are not doing well or get worse. Document Released: 07/22/2007 Document Revised: 12/17/2011 Document Reviewed: 01/26/2014 [facility] Patient Information 2015 [place], [place]. This information is not intended to replace advice given to you by your health care provider. Make sure you discuss any questions you have with your health care provider.

## 2015-07-22 ENCOUNTER — Encounter: Payer: Self-pay | Admitting: Emergency Medicine

## 2015-07-22 ENCOUNTER — Emergency Department: Payer: Medicare Other

## 2015-07-22 ENCOUNTER — Emergency Department
Admission: EM | Admit: 2015-07-22 | Discharge: 2015-07-23 | Disposition: A | Discharge status: Home / Self Care | Payer: Medicare Other | Attending: Emergency Medicine | Admitting: Emergency Medicine

## 2015-07-22 DIAGNOSIS — E119 Type 2 diabetes mellitus without complications: Secondary | ICD-10-CM | POA: Insufficient documentation

## 2015-07-22 DIAGNOSIS — W01198A Fall on same level from slipping, tripping and stumbling with subsequent striking against other object, initial encounter: Secondary | ICD-10-CM | POA: Diagnosis not present

## 2015-07-22 DIAGNOSIS — Y92009 Unspecified place in unspecified non-institutional (private) residence as the place of occurrence of the external cause: Secondary | ICD-10-CM | POA: Diagnosis not present

## 2015-07-22 DIAGNOSIS — Z87891 Personal history of nicotine dependence: Secondary | ICD-10-CM | POA: Insufficient documentation

## 2015-07-22 DIAGNOSIS — Z7984 Long term (current) use of oral hypoglycemic drugs: Secondary | ICD-10-CM | POA: Insufficient documentation

## 2015-07-22 DIAGNOSIS — I10 Essential (primary) hypertension: Secondary | ICD-10-CM | POA: Diagnosis not present

## 2015-07-22 DIAGNOSIS — Y9389 Activity, other specified: Secondary | ICD-10-CM | POA: Insufficient documentation

## 2015-07-22 DIAGNOSIS — S42292A Other displaced fracture of upper end of left humerus, initial encounter for closed fracture: Secondary | ICD-10-CM | POA: Diagnosis not present

## 2015-07-22 DIAGNOSIS — Y998 Other external cause status: Secondary | ICD-10-CM | POA: Diagnosis not present

## 2015-07-22 DIAGNOSIS — S4992XA Unspecified injury of left shoulder and upper arm, initial encounter: Secondary | ICD-10-CM | POA: Diagnosis present

## 2015-07-22 DIAGNOSIS — Z791 Long term (current) use of non-steroidal anti-inflammatories (NSAID): Secondary | ICD-10-CM | POA: Insufficient documentation

## 2015-07-22 DIAGNOSIS — Z79899 Other long term (current) drug therapy: Secondary | ICD-10-CM | POA: Insufficient documentation

## 2015-07-22 DIAGNOSIS — S42302A Unspecified fracture of shaft of humerus, left arm, initial encounter for closed fracture: Secondary | ICD-10-CM

## 2015-07-22 DIAGNOSIS — W19XXXA Unspecified fall, initial encounter: Secondary | ICD-10-CM

## 2015-07-22 MED ORDER — MORPHINE SULFATE (PF) 4 MG/ML IV SOLN
4.0000 mg | Freq: Once | INTRAVENOUS | Status: AC
  Administered 2015-07-22: 4 mg via INTRAVENOUS

## 2015-07-22 MED ORDER — ONDANSETRON HCL 4 MG/2ML IJ SOLN
4.0000 mg | Freq: Once | INTRAMUSCULAR | Status: AC
  Administered 2015-07-22: 4 mg via INTRAVENOUS

## 2015-07-22 MED ORDER — ONDANSETRON HCL 4 MG/2ML IJ SOLN
INTRAMUSCULAR | Status: AC
  Administered 2015-07-22: 4 mg via INTRAVENOUS
  Filled 2015-07-22: qty 2

## 2015-07-22 MED ORDER — MORPHINE SULFATE (PF) 4 MG/ML IV SOLN
INTRAVENOUS | Status: AC
  Administered 2015-07-22: 4 mg via INTRAVENOUS
  Filled 2015-07-22: qty 1

## 2015-07-22 NOTE — ED Notes (Signed)
Pt. States she tripped on rug in house.  Pt. Hit lt. Arm on the way down to floor.  Pt. Has some hemorrhage to rt. Eye.  Pt. Denies LOC.

## 2015-07-22 NOTE — ED Notes (Signed)
Pt. States she tripped on a rug at home and hit lt. Forearm on wall.  Pt. States pain to lt. Shoulder and forearm.  Pt. Denies LOC.  Pt. Has hemorrhage to rt. Eye.

## 2015-07-23 ENCOUNTER — Emergency Department: Payer: Medicare Other

## 2015-07-23 DIAGNOSIS — S42292A Other displaced fracture of upper end of left humerus, initial encounter for closed fracture: Secondary | ICD-10-CM | POA: Diagnosis not present

## 2015-07-23 MED ORDER — MORPHINE SULFATE (PF) 4 MG/ML IV SOLN
INTRAVENOUS | Status: AC
  Administered 2015-07-23: 4 mg via INTRAVENOUS
  Filled 2015-07-23: qty 1

## 2015-07-23 MED ORDER — OXYCODONE-ACETAMINOPHEN 5-325 MG PO TABS
ORAL_TABLET | ORAL | Status: AC
  Administered 2015-07-23: 2 via ORAL
  Filled 2015-07-23: qty 2

## 2015-07-23 MED ORDER — OXYCODONE-ACETAMINOPHEN 5-325 MG PO TABS
2.0000 | ORAL_TABLET | Freq: Once | ORAL | Status: AC
  Administered 2015-07-23: 2 via ORAL

## 2015-07-23 MED ORDER — MORPHINE SULFATE (PF) 4 MG/ML IV SOLN
4.0000 mg | Freq: Once | INTRAVENOUS | Status: AC
  Administered 2015-07-23: 4 mg via INTRAVENOUS

## 2015-07-23 NOTE — ED Provider Notes (Signed)
[facility] Emergency Department Provider Note  ____________________________________________  Time seen: Approximately 2:07 AM  I have reviewed the triage vital signs and the nursing notes.   HISTORY  Chief Complaint Fall    HPI [name] [name] is a 64 y.o. female patient reports she tripped over a rug at home tonight fell and hit her left arm. The left arm hurts a lot. No numbness or weakness distally. Patient did not hit her head did not pass out is not having a headache nausea vomiting or mental status changes. Patient complains of moderate severe pain in the arm.   Past Medical History  Diagnosis Date  . Hypertension   . Diabetes mellitus without complication ([place])   . GERD (gastroesophageal reflux disease)   . Asthma     There are no active problems to display for this patient.   Past Surgical History  Procedure Laterality Date  . Abdominal hysterectomy    . Cesarean section      Current Outpatient Rx  Name  Route  Sig  Dispense  Refill  . allopurinol (ZYLOPRIM) 100 MG tablet   Oral   Take 100 mg by mouth daily.         . diazepam (VALIUM) 5 MG tablet   Oral   Take 1 tablet (5 mg total) by mouth every 8 (eight) hours as needed for muscle spasms.   14 tablet   0   . esomeprazole (NEXIUM) 40 MG capsule   Oral   Take 40 mg by mouth daily at 12 noon.         . gabapentin (NEURONTIN) 100 MG capsule   Oral   Take 1 capsule (100 mg total) by mouth 3 (three) times daily. Maximum 3 pills 3 times daily.   90 capsule   0   . meloxicam (MOBIC) 7.5 MG tablet   Oral   Take 7.5 mg by mouth daily.         . metFORMIN (GLUCOPHAGE) 500 MG tablet   Oral   Take 500 mg by mouth 2 (two) times daily with a meal.         . predniSONE (STERAPRED UNI-PAK 21 TAB) 10 MG (21) TBPK tablet      6 tablets on day 1, 5 tablets on day 2, 4 tablets on day 3, etc...   21 tablet   0   . simvastatin (ZOCOR) 20 MG tablet   Oral   Take 20 mg by mouth  daily.         Marland Kitchen triamterene-hydrochlorothiazide (DYAZIDE) 37.5-25 MG capsule   Oral   Take 1 capsule by mouth daily.         Marland Kitchen oxyCODONE-acetaminophen (ROXICET) 5-325 MG per tablet   Oral   Take 1 tablet by mouth every 6 (six) hours as needed.   20 tablet   0     Allergies Codeine  No family history on file.  Social History Social History  Substance Use Topics  . Smoking status: Former Research scientist (life sciences)  . Smokeless tobacco: None  . Alcohol Use: No    Review of Systems Constitutional: No fever/chills Eyes: No visual changes. ENT: No sore throat. Cardiovascular: Denies chest pain. Respiratory: Denies shortness of breath. Gastrointestinal: No abdominal pain.  No nausea, no vomiting.  No diarrhea.  No constipation. Genitourinary: Negative for dysuria. Musculoskeletal: Negative for back pain. Skin: Negative for rash. Neurological: Negative for headaches, focal weakness or numbness.  10-point ROS otherwise negative.  ____________________________________________  PHYSICAL EXAM:  VITAL SIGNS: ED Triage Vitals  Enc Vitals Group     BP 07/22/15 2319 140/69 mmHg     Pulse Rate 07/22/15 2319 72     Resp 07/22/15 2319 18     Temp 07/22/15 2319 97.3 F (36.3 C)     Temp Source 07/22/15 2319 Oral     SpO2 07/22/15 2319 98 %     Weight 07/22/15 2319 223 lb (101.152 kg)     Height 07/22/15 2319 4\' 11"  (1.499 m)     Head Cir --      Peak Flow --      Pain Score 07/22/15 2320 10     Pain Loc --      Pain Edu? --      Excl. in [place]? --     Constitutional: Alert and oriented. Well appearing Eyes: Conjunctivae are normal. PERRL. EOMI. right eye is very injected looks like her son some conjunctival hemorrhage. Fundi is difficult to do but appears to be normal. Patient reports vision is completely normal with her glasses. Reexam after the CT patient still says vision is completely normal bilaterally. Head: Atraumatic. Nose: No congestion/rhinnorhea. Mouth/Throat: Mucous  membranes are moist.  Oropharynx non-erythematous. Neck: No stridor. No cervical spine tenderness to palpation. Cardiovascular: Normal rate, regular rhythm. Grossly normal heart sounds.  Good peripheral circulation. Respiratory: Normal respiratory effort.  No retractions. Lungs CTAB. Gastrointestinal: Soft and nontender. No distention. No abdominal bruits. No CVA tenderness. Musculoskeletal: No lower extremity tenderness nor edema.  No joint effusions. Left upper arm is very tender. Forearm is not as tender. Normal pulse distally normal sensation and motor strength and hand. Neurologic:  Normal speech and language. No gross focal neurologic deficits are appreciated. No gait instability. Skin:  Skin is warm, dry and intact. No rash noted. Psychiatric: Mood and affect are normal. Speech and behavior are normal.  ____________________________________________   LABS (all labs ordered are listed, but only abnormal results are displayed)  Labs Reviewed - No data to display ____________________________________________  EKG   ____________________________________________  RADIOLOGY  X-ray shows a severely comminuted fracture humerus ____________________________________________   PROCEDURES Discussed patient with Dr. Mack Guise on call for orthopedics. Described the x-ray to him. He reports either splint or shoulder mobilized the patient will follow-up on Monday. ____________________________________________   INITIAL IMPRESSION / ASSESSMENT AND PLAN / ED COURSE  Pertinent labs & imaging results that were available during my care of the patient were reviewed by me and considered in my medical decision making (see chart for details).   ____________________________________________   FINAL CLINICAL IMPRESSION(S) / ED DIAGNOSES  Final diagnoses:  Fall, initial encounter  Humerus fracture, left, closed, initial encounter      Nena Polio, MD 07/23/15 [phone]

## 2015-07-23 NOTE — ED Notes (Signed)
Pt. Going home with family, will follow up with ortho dr. Monday.

## 2015-07-23 NOTE — Discharge Instructions (Signed)
°Cast or Splint Care  ° ° °Casts and splints support injured limbs and keep bones from moving while they heal. It is important to care for your cast or splint at home.  °HOME CARE INSTRUCTIONS  °Keep the cast or splint uncovered during the drying period. It can take 24 to 48 hours to dry if it is made of plaster. A fiberglass cast will dry in less than 1 hour.  °Do not rest the cast on anything harder than a pillow for the first 24 hours.  °Do not put weight on your injured limb or apply pressure to the cast until your health care provider gives you permission.  °Keep the cast or splint dry. Wet casts or splints can lose their shape and may not support the limb as well. A wet cast that has lost its shape can also create harmful pressure on your skin when it dries. Also, wet skin can become infected.  °Cover the cast or splint with a plastic bag when bathing or when out in the rain or snow. If the cast is on the trunk of the body, take sponge baths until the cast is removed.  °If your cast does become wet, dry it with a towel or a blow dryer on the cool setting only. °Keep your cast or splint clean. Soiled casts may be wiped with a moistened cloth.  °Do not place any hard or soft foreign objects under your cast or splint, such as cotton, toilet paper, lotion, or powder.  °Do not try to scratch the skin under the cast with any object. The object could get stuck inside the cast. Also, scratching could lead to an infection. If itching is a problem, use a blow dryer on a cool setting to relieve discomfort.  °Do not trim or cut your cast or remove padding from inside of it.  °Exercise all joints next to the injury that are not immobilized by the cast or splint. For example, if you have a long leg cast, exercise the hip joint and toes. If you have an arm cast or splint, exercise the shoulder, elbow, thumb, and fingers.  °Elevate your injured arm or leg on 1 or 2 pillows for the first 1 to 3 days to decrease swelling and  pain. It is best if you can comfortably elevate your cast so it is higher than your heart. °SEEK MEDICAL CARE IF:  °Your cast or splint cracks.  °Your cast or splint is too tight or too loose.  °You have unbearable itching inside the cast.  °Your cast becomes wet or develops a soft spot or area.  °You have a bad smell coming from inside your cast.  °You get an object stuck under your cast.  °Your skin around the cast becomes red or raw.  °You have new pain or worsening pain after the cast has been applied. °SEEK IMMEDIATE MEDICAL CARE IF:  °You have fluid leaking through the cast.  °You are unable to move your fingers or toes.  °You have discolored (blue or white), cool, painful, or very swollen fingers or toes beyond the cast.  °You have tingling or numbness around the injured area.  °You have severe pain or pressure under the cast.  °You have any difficulty with your breathing or have shortness of breath.  °You have chest pain. °This information is not intended to replace advice given to you by your health care provider. Make sure you discuss any questions you have with your health care provider.  °  Document Released: 09/21/2000 Document Revised: 07/15/2013 Document Reviewed: 04/02/2013  °Elsevier Interactive Patient Education ©2016 [employer]  ° °

## 2015-07-28 ENCOUNTER — Emergency Department
Admission: EM | Admit: 2015-07-28 | Discharge: 2015-07-28 | Disposition: A | Discharge status: Home / Self Care | Payer: Medicare Other | Attending: Emergency Medicine | Admitting: Emergency Medicine

## 2015-07-28 ENCOUNTER — Encounter: Payer: Self-pay | Admitting: Emergency Medicine

## 2015-07-28 DIAGNOSIS — E119 Type 2 diabetes mellitus without complications: Secondary | ICD-10-CM | POA: Insufficient documentation

## 2015-07-28 DIAGNOSIS — Z79899 Other long term (current) drug therapy: Secondary | ICD-10-CM | POA: Diagnosis not present

## 2015-07-28 DIAGNOSIS — I10 Essential (primary) hypertension: Secondary | ICD-10-CM | POA: Insufficient documentation

## 2015-07-28 DIAGNOSIS — X58XXXD Exposure to other specified factors, subsequent encounter: Secondary | ICD-10-CM | POA: Insufficient documentation

## 2015-07-28 DIAGNOSIS — S42302D Unspecified fracture of shaft of humerus, left arm, subsequent encounter for fracture with routine healing: Secondary | ICD-10-CM | POA: Diagnosis not present

## 2015-07-28 DIAGNOSIS — Z87891 Personal history of nicotine dependence: Secondary | ICD-10-CM | POA: Diagnosis not present

## 2015-07-28 DIAGNOSIS — Z791 Long term (current) use of non-steroidal anti-inflammatories (NSAID): Secondary | ICD-10-CM | POA: Insufficient documentation

## 2015-07-28 DIAGNOSIS — S4992XD Unspecified injury of left shoulder and upper arm, subsequent encounter: Secondary | ICD-10-CM | POA: Diagnosis present

## 2015-07-28 MED ORDER — OXYCODONE-ACETAMINOPHEN 5-325 MG PO TABS: ORAL_TABLET | ORAL | Status: DC

## 2015-07-28 NOTE — ED Notes (Signed)
States she has a fx humerus and is requesting a new shoulder immobilizer  Denies new injury

## 2015-07-28 NOTE — ED Notes (Signed)
Pt to ed with c/o left arm pain, states she was seen here on Friday for same.  Pt states she has a fractured arm but took a bath and therefore took off the cast that was placed on her Friday pm.  Pt states she has appt with ortho on oct 26th.

## 2015-07-28 NOTE — ED Provider Notes (Signed)
[facility] Emergency Department Provider Note  ____________________________________________  Time seen: Approximately 11:27 AM  I have reviewed the triage vital signs and the nursing notes.   HISTORY  Chief Complaint Arm Pain   HPI REMEDY CORPORAN is a 64 y.o. female is here with complaint of needing a new shoulder immobilizer. Patient was seen for a fracture of her humerus and has gotten a appointment with the orthopedist. She was told that she could take the immobilizer off to take a bath. She felt that this was dirty and threw it away. She is here for a replacement.Her next appointment with orthopedics is unknown October 26.   Past Medical History  Diagnosis Date  . Hypertension   . Diabetes mellitus without complication ([place])   . GERD (gastroesophageal reflux disease)   . Asthma     There are no active problems to display for this patient.   Past Surgical History  Procedure Laterality Date  . Abdominal hysterectomy    . Cesarean section      Current Outpatient Rx  Name  Route  Sig  Dispense  Refill  . allopurinol (ZYLOPRIM) 100 MG tablet   Oral   Take 100 mg by mouth daily.         . diazepam (VALIUM) 5 MG tablet   Oral   Take 1 tablet (5 mg total) by mouth every 8 (eight) hours as needed for muscle spasms.   14 tablet   0   . esomeprazole (NEXIUM) 40 MG capsule   Oral   Take 40 mg by mouth daily at 12 noon.         . gabapentin (NEURONTIN) 100 MG capsule   Oral   Take 1 capsule (100 mg total) by mouth 3 (three) times daily. Maximum 3 pills 3 times daily.   90 capsule   0   . meloxicam (MOBIC) 7.5 MG tablet   Oral   Take 7.5 mg by mouth daily.         . metFORMIN (GLUCOPHAGE) 500 MG tablet   Oral   Take 500 mg by mouth 2 (two) times daily with a meal.         . oxyCODONE-acetaminophen (ROXICET) 5-325 MG per tablet   Oral   Take 1 tablet by mouth every 6 (six) hours as needed.   20 tablet   0   . predniSONE  (STERAPRED UNI-PAK 21 TAB) 10 MG (21) TBPK tablet      6 tablets on day 1, 5 tablets on day 2, 4 tablets on day 3, etc...   21 tablet   0   . simvastatin (ZOCOR) 20 MG tablet   Oral   Take 20 mg by mouth daily.         Marland Kitchen triamterene-hydrochlorothiazide (DYAZIDE) 37.5-25 MG capsule   Oral   Take 1 capsule by mouth daily.           Allergies Codeine  History reviewed. No pertinent family history.  Social History Social History  Substance Use Topics  . Smoking status: Former Research scientist (life sciences)  . Smokeless tobacco: None  . Alcohol Use: No    Review of Systems Constitutional: No fever/chills Eyes: No visual changes. Cardiovascular: Denies chest pain. Respiratory: Denies shortness of breath. Gastrointestinal: No abdominal pain.  No nausea, no vomiting.  Musculoskeletal: Negative for back pain. Left arm pain positive Skin: Negative for rash. Neurological: Negative for headaches, focal weakness or numbness.  10-point ROS otherwise negative.  ____________________________________________  PHYSICAL EXAM:  VITAL SIGNS: ED Triage Vitals  Enc Vitals Group     BP 07/28/15 1034 132/77 mmHg     Pulse Rate 07/28/15 1034 92     Resp 07/28/15 1034 20     Temp 07/28/15 1034 98.2 F (36.8 C)     Temp Source 07/28/15 1034 Oral     SpO2 07/28/15 1034 100 %     Weight 07/28/15 1034 223 lb (101.152 kg)     Height 07/28/15 1034 4\' 11"  (1.499 m)     Head Cir --      Peak Flow --      Pain Score 07/28/15 1034 7     Pain Loc --      Pain Edu? --      Excl. in [place]? --     Constitutional: Alert and oriented. Well appearing and in no acute distress. Eyes: Conjunctivae are normal. PERRL. EOMI. Head: Atraumatic. Nose: No congestion/rhinnorhea. Neck: No stridor.   Cardiovascular: Normal rate, regular rhythm. Grossly normal heart sounds.  Good peripheral circulation. Respiratory: Normal respiratory effort.  No retractions. Lungs CTAB. Gastrointestinal: Soft and nontender. No  distention Musculoskeletal: Moderate tenderness on palpation of the left humerus. Patient is guarding left arm against her body. Range of motion was not tested secondary to patient's pain. Neurologic:  Normal speech and language. No gross focal neurologic deficits are appreciated. No gait instability. Skin:  Skin is warm, dry and intact. No rash noted. Psychiatric: Mood and affect are normal. Speech and behavior are normal.  ____________________________________________   LABS (all labs ordered are listed, but only abnormal results are displayed)  Labs Reviewed - No data to display  PROCEDURES  Procedure(s) performed: None  Critical Care performed: No  ____________________________________________   INITIAL IMPRESSION / ASSESSMENT AND PLAN / ED COURSE  Pertinent labs & imaging results that were available during my care of the patient were reviewed by me and considered in my medical decision making (see chart for details).  X-ray from her last ER encounter on 10/15 was reviewed. Patient was placed in another shoulder immobilizer and told to leave this on until seen by the orthopedist. ____________________________________________   FINAL CLINICAL IMPRESSION(S) / ED DIAGNOSES  Final diagnoses:  Fracture of left humerus, with routine healing, subsequent encounter      Johnn Hai, PA-C 07/28/15 [place], MD 07/28/15 1250

## 2015-07-31 ENCOUNTER — Emergency Department: Payer: Medicare Other

## 2015-07-31 ENCOUNTER — Emergency Department
Admission: EM | Admit: 2015-07-31 | Discharge: 2015-07-31 | Disposition: A | Discharge status: Home / Self Care | Payer: Medicare Other | Attending: Emergency Medicine | Admitting: Emergency Medicine

## 2015-07-31 ENCOUNTER — Encounter: Payer: Self-pay | Admitting: Emergency Medicine

## 2015-07-31 DIAGNOSIS — S42392D Other fracture of shaft of left humerus, subsequent encounter for fracture with routine healing: Secondary | ICD-10-CM | POA: Diagnosis not present

## 2015-07-31 DIAGNOSIS — S42302D Unspecified fracture of shaft of humerus, left arm, subsequent encounter for fracture with routine healing: Secondary | ICD-10-CM

## 2015-07-31 DIAGNOSIS — R52 Pain, unspecified: Secondary | ICD-10-CM

## 2015-07-31 DIAGNOSIS — I10 Essential (primary) hypertension: Secondary | ICD-10-CM | POA: Insufficient documentation

## 2015-07-31 DIAGNOSIS — R21 Rash and other nonspecific skin eruption: Secondary | ICD-10-CM | POA: Diagnosis present

## 2015-07-31 DIAGNOSIS — B372 Candidiasis of skin and nail: Secondary | ICD-10-CM | POA: Insufficient documentation

## 2015-07-31 DIAGNOSIS — W1839XD Other fall on same level, subsequent encounter: Secondary | ICD-10-CM | POA: Diagnosis not present

## 2015-07-31 DIAGNOSIS — E119 Type 2 diabetes mellitus without complications: Secondary | ICD-10-CM | POA: Insufficient documentation

## 2015-07-31 DIAGNOSIS — Z7952 Long term (current) use of systemic steroids: Secondary | ICD-10-CM | POA: Diagnosis not present

## 2015-07-31 DIAGNOSIS — M79605 Pain in left leg: Secondary | ICD-10-CM

## 2015-07-31 DIAGNOSIS — Z87891 Personal history of nicotine dependence: Secondary | ICD-10-CM | POA: Diagnosis not present

## 2015-07-31 DIAGNOSIS — Z79899 Other long term (current) drug therapy: Secondary | ICD-10-CM | POA: Diagnosis not present

## 2015-07-31 DIAGNOSIS — Z791 Long term (current) use of non-steroidal anti-inflammatories (NSAID): Secondary | ICD-10-CM | POA: Insufficient documentation

## 2015-07-31 MED ORDER — CEPHALEXIN 500 MG PO CAPS: 500.0000 mg | ORAL_CAPSULE | Freq: Two times a day (BID) | ORAL | Status: AC

## 2015-07-31 MED ORDER — NYSTATIN 100000 UNIT/GM EX CREA: 1.0000 "application " | TOPICAL_CREAM | Freq: Two times a day (BID) | CUTANEOUS | Status: DC

## 2015-07-31 NOTE — ED Notes (Signed)
Pt states shoulder immobilizer is causing a rash to underneath breast and wants something else for her arm.

## 2015-07-31 NOTE — ED Provider Notes (Signed)
[facility] Emergency Department Provider Note ____________________________________________  Time seen: Approximately 11:33 AM  I have reviewed the triage vital signs and the nursing notes.   HISTORY  Chief Complaint Rash   HPI [name] [name] is a 64 y.o. female who presents to the emergency department for evaluation of a rash under the shoulder immobilizer that was placed secondary to a humerus fracture. She was examined here 3 days ago after a fall. She states that she is also concerned that she re-injured her arm yesterday when someone was trying to help her get up and pulled on her left arm. She also states that today her left leg has been increasingly painful. There was no imaging of her leg done on the initial visit after the fall.    Past Medical History  Diagnosis Date  . Hypertension   . Diabetes mellitus without complication ([place])   . GERD (gastroesophageal reflux disease)   . Asthma     There are no active problems to display for this patient.   Past Surgical History  Procedure Laterality Date  . Abdominal hysterectomy    . Cesarean section      Current Outpatient Rx  Name  Route  Sig  Dispense  Refill  . allopurinol (ZYLOPRIM) 100 MG tablet   Oral   Take 100 mg by mouth daily.         . cephALEXin (KEFLEX) 500 MG capsule   Oral   Take 1 capsule (500 mg total) by mouth 2 (two) times daily.   20 capsule   0   . diazepam (VALIUM) 5 MG tablet   Oral   Take 1 tablet (5 mg total) by mouth every 8 (eight) hours as needed for muscle spasms.   14 tablet   0   . esomeprazole (NEXIUM) 40 MG capsule   Oral   Take 40 mg by mouth daily at 12 noon.         . gabapentin (NEURONTIN) 100 MG capsule   Oral   Take 1 capsule (100 mg total) by mouth 3 (three) times daily. Maximum 3 pills 3 times daily.   90 capsule   0   . meloxicam (MOBIC) 7.5 MG tablet   Oral   Take 7.5 mg by mouth daily.         . metFORMIN (GLUCOPHAGE) 500 MG  tablet   Oral   Take 500 mg by mouth 2 (two) times daily with a meal.         . nystatin cream (MYCOSTATIN)   Topical   Apply 1 application topically 2 (two) times daily.   30 g   0   . oxyCODONE-acetaminophen (PERCOCET) 5-325 MG tablet      Take 1 q 4-6 hours prn pain   30 tablet   0   . predniSONE (STERAPRED UNI-PAK 21 TAB) 10 MG (21) TBPK tablet      6 tablets on day 1, 5 tablets on day 2, 4 tablets on day 3, etc...   21 tablet   0   . simvastatin (ZOCOR) 20 MG tablet   Oral   Take 20 mg by mouth daily.         Marland Kitchen triamterene-hydrochlorothiazide (DYAZIDE) 37.5-25 MG capsule   Oral   Take 1 capsule by mouth daily.           Allergies Codeine  No family history on file.  Social History Social History  Substance Use Topics  . Smoking status:  Former Smoker  . Smokeless tobacco: None  . Alcohol Use: No    Review of Systems Constitutional: No recent illness. Eyes: No visual changes. ENT: No sore throat. Cardiovascular: Denies chest pain or palpitations. Respiratory: Denies shortness of breath. Gastrointestinal: No abdominal pain.  Genitourinary: Negative for dysuria. Musculoskeletal: Pain in left upper arm and left lower extremity below the knee. Skin: Negative for rash. Neurological: Negative for headaches, focal weakness or numbness. 10-point ROS otherwise negative.  ____________________________________________   PHYSICAL EXAM:  VITAL SIGNS: ED Triage Vitals  Enc Vitals Group     BP 07/31/15 0948 126/68 mmHg     Pulse Rate 07/31/15 0948 98     Resp 07/31/15 0948 18     Temp 07/31/15 0948 97.8 F (36.6 C)     Temp Source 07/31/15 0948 Oral     SpO2 07/31/15 0948 97 %     Weight 07/31/15 0948 223 lb (101.152 kg)     Height 07/31/15 0948 4\' 11"  (1.499 m)     Head Cir --      Peak Flow --      Pain Score 07/31/15 0947 0     Pain Loc --      Pain Edu? --      Excl. in [place]? --     Constitutional: Alert and oriented. Well appearing and in  no acute distress. Eyes: Conjunctivae are normal. EOMI. Head: Atraumatic. Nose: No congestion/rhinnorhea. Neck: No stridor.  Respiratory: Normal respiratory effort.   Musculoskeletal: Limited range of motion of left upper arm due to known humerus fracture. Full range of motion of left elbow, left wrist, and left hand and fingers. Neurologic:  Normal speech and language. No gross focal neurologic deficits are appreciated. Speech is normal. No gait instability. Skin:  Erythematous areas under the belt of a shoulder immobilizer circumferential, foul-smelling exudate Psychiatric: Mood and affect are normal. Speech and behavior are normal.  ____________________________________________   LABS (all labs ordered are listed, but only abnormal results are displayed)  Labs Reviewed - No data to display ____________________________________________  RADIOLOGY  Left humerus negative for change, mildly comminuted proximal humeral shaft fracture that is minimally displaced.  No acute bony abnormality of the left lower extremity. ____________________________________________   PROCEDURES  Procedure(s) performed:   Arm sling applied by ER tech.   ____________________________________________   INITIAL IMPRESSION / ASSESSMENT AND PLAN / ED COURSE  Pertinent labs & imaging results that were available during my care of the patient were reviewed by me and considered in my medical decision making (see chart for details).  Patient was advised to follow-up with orthopedist as soon as possible. She was advised to call tomorrow to schedule an earlier appointment or keep her appointment as scheduled on October 26. Because of body habitus, diabetes, and open skin, she will be placed empirically on Keflex to prevent infection. She was also given a prescription for nystatin cream to be used twice a day. Patient had requested additional prescription for oxycodone, however she was given 30 tablets 3 days  ago. She was advised that it is too early to write another prescription and was advised to discuss this with orthopedics. ____________________________________________   FINAL CLINICAL IMPRESSION(S) / ED DIAGNOSES  Final diagnoses:  Pain  Fracture, humerus, left, with routine healing, subsequent encounter  Musculoskeletal leg pain, left  Cutaneous candidiasis       Victorino Dike, FNP 07/31/15 [place], MD 07/31/15 1537

## 2015-08-22 ENCOUNTER — Emergency Department
Admission: EM | Admit: 2015-08-22 | Discharge: 2015-08-22 | Disposition: A | Discharge status: Home / Self Care | Payer: Medicare Other | Attending: Emergency Medicine | Admitting: Emergency Medicine

## 2015-08-22 ENCOUNTER — Encounter: Payer: Self-pay | Admitting: Emergency Medicine

## 2015-08-22 DIAGNOSIS — Z79899 Other long term (current) drug therapy: Secondary | ICD-10-CM | POA: Insufficient documentation

## 2015-08-22 DIAGNOSIS — X58XXXA Exposure to other specified factors, initial encounter: Secondary | ICD-10-CM | POA: Diagnosis not present

## 2015-08-22 DIAGNOSIS — M25562 Pain in left knee: Secondary | ICD-10-CM | POA: Diagnosis not present

## 2015-08-22 DIAGNOSIS — Y9289 Other specified places as the place of occurrence of the external cause: Secondary | ICD-10-CM | POA: Diagnosis not present

## 2015-08-22 DIAGNOSIS — S76312A Strain of muscle, fascia and tendon of the posterior muscle group at thigh level, left thigh, initial encounter: Secondary | ICD-10-CM | POA: Diagnosis not present

## 2015-08-22 DIAGNOSIS — Z7984 Long term (current) use of oral hypoglycemic drugs: Secondary | ICD-10-CM | POA: Insufficient documentation

## 2015-08-22 DIAGNOSIS — Y998 Other external cause status: Secondary | ICD-10-CM | POA: Diagnosis not present

## 2015-08-22 DIAGNOSIS — Z87891 Personal history of nicotine dependence: Secondary | ICD-10-CM | POA: Insufficient documentation

## 2015-08-22 DIAGNOSIS — Y9389 Activity, other specified: Secondary | ICD-10-CM | POA: Diagnosis not present

## 2015-08-22 DIAGNOSIS — E119 Type 2 diabetes mellitus without complications: Secondary | ICD-10-CM | POA: Insufficient documentation

## 2015-08-22 DIAGNOSIS — I10 Essential (primary) hypertension: Secondary | ICD-10-CM | POA: Diagnosis not present

## 2015-08-22 MED ORDER — MELOXICAM 15 MG PO TABS: 15.0000 mg | ORAL_TABLET | Freq: Every day | ORAL | Status: DC

## 2015-08-22 NOTE — ED Provider Notes (Signed)
[facility] Emergency Department Provider Note  ____________________________________________  Time seen: Approximately 11:18 AM  I have reviewed the triage vital signs and the nursing notes.   HISTORY  Chief Complaint Knee Pain    HPI [name] [name] is a 64 y.o. female who presents emergency department complaining of left knee pain. She states that she fell a month ago and has been seen at this department and [facility] multiple times for care. The patient does have a fractured humerus from same incident. Patient had an MRI at [facility] which revealed multiple tears to the muscles surrounding left knee. Patient states that she is still in pain and is requesting pain medication.Pain is constant, worse with movement, and Percocet is "not taking it all away."   Past Medical History  Diagnosis Date  . Hypertension   . Diabetes mellitus without complication ([place])   . GERD (gastroesophageal reflux disease)   . Asthma     There are no active problems to display for this patient.   Past Surgical History  Procedure Laterality Date  . Abdominal hysterectomy    . Cesarean section      Current Outpatient Rx  Name  Route  Sig  Dispense  Refill  . allopurinol (ZYLOPRIM) 100 MG tablet   Oral   Take 100 mg by mouth daily.         . diazepam (VALIUM) 5 MG tablet   Oral   Take 1 tablet (5 mg total) by mouth every 8 (eight) hours as needed for muscle spasms.   14 tablet   0   . esomeprazole (NEXIUM) 40 MG capsule   Oral   Take 40 mg by mouth daily at 12 noon.         . gabapentin (NEURONTIN) 100 MG capsule   Oral   Take 1 capsule (100 mg total) by mouth 3 (three) times daily. Maximum 3 pills 3 times daily.   90 capsule   0   . meloxicam (MOBIC) 15 MG tablet   Oral   Take 1 tablet (15 mg total) by mouth daily.   30 tablet   0   . metFORMIN (GLUCOPHAGE) 500 MG tablet   Oral   Take 500 mg by mouth 2 (two) times daily with a meal.         . nystatin cream  (MYCOSTATIN)   Topical   Apply 1 application topically 2 (two) times daily.   30 g   0   . oxyCODONE-acetaminophen (PERCOCET) 5-325 MG tablet      Take 1 q 4-6 hours prn pain   30 tablet   0   . predniSONE (STERAPRED UNI-PAK 21 TAB) 10 MG (21) TBPK tablet      6 tablets on day 1, 5 tablets on day 2, 4 tablets on day 3, etc...   21 tablet   0   . simvastatin (ZOCOR) 20 MG tablet   Oral   Take 20 mg by mouth daily.         Marland Kitchen triamterene-hydrochlorothiazide (DYAZIDE) 37.5-25 MG capsule   Oral   Take 1 capsule by mouth daily.           Allergies Codeine  History reviewed. No pertinent family history.  Social History Social History  Substance Use Topics  . Smoking status: Former Research scientist (life sciences)  . Smokeless tobacco: None  . Alcohol Use: No    Review of Systems Constitutional: No fever/chills Eyes: No visual changes. ENT: No sore throat. Cardiovascular: Denies  chest pain. Respiratory: Denies shortness of breath. Gastrointestinal: No abdominal pain.  No nausea, no vomiting.  No diarrhea.  No constipation. Genitourinary: Negative for dysuria. Musculoskeletal: Negative for back pain. Endorses left knee pain. Skin: Negative for rash. Neurological: Negative for headaches, focal weakness or numbness.  10-point ROS otherwise negative.  ____________________________________________   PHYSICAL EXAM:  VITAL SIGNS: ED Triage Vitals  Enc Vitals Group     BP 08/22/15 1048 121/78 mmHg     Pulse Rate 08/22/15 1048 88     Resp 08/22/15 1048 18     Temp 08/22/15 1048 97.6 F (36.4 C)     Temp Source 08/22/15 1048 Oral     SpO2 08/22/15 1048 99 %     Weight 08/22/15 1048 223 lb (101.152 kg)     Height 08/22/15 1048 4\' 11"  (1.499 m)     Head Cir --      Peak Flow --      Pain Score 08/22/15 1005 10     Pain Loc --      Pain Edu? --      Excl. in [place]? --     Constitutional: Alert and oriented. Well appearing and in no acute distress. Eyes: Conjunctivae are normal.  PERRL. EOMI. Head: Atraumatic. Nose: No congestion/rhinnorhea. Mouth/Throat: Mucous membranes are moist.  Oropharynx non-erythematous. Neck: No stridor.   Cardiovascular: Normal rate, regular rhythm. Grossly normal heart sounds.  Good peripheral circulation. Respiratory: Normal respiratory effort.  No retractions. Lungs CTAB. Gastrointestinal: Soft and nontender. No distention. No abdominal bruits. No CVA tenderness. Musculoskeletal: No visible abnormality to left knee when compared with right. No abrasion, contusion, ecchymosis, or hematoma. Patient has full range of motion. Reports pain with motion. Patient is diffusely tender over the musculature to superior and inferior knee. No palpable abnormality.  No joint effusions. Neurologic:  Normal speech and language. No gross focal neurologic deficits are appreciated. No gait instability. Skin:  Skin is warm, dry and intact. No rash noted. Psychiatric: Mood and affect are normal. Speech and behavior are normal.  ____________________________________________   LABS (all labs ordered are listed, but only abnormal results are displayed)  Labs Reviewed - No data to display ____________________________________________  EKG   ____________________________________________  RADIOLOGY  MRI results from [facility] were reviewed by me. ____________________________________________   PROCEDURES  Procedure(s) performed: None  Critical Care performed: No  ____________________________________________   INITIAL IMPRESSION / ASSESSMENT AND PLAN / ED COURSE  Pertinent labs & imaging results that were available during my care of the patient were reviewed by me and considered in my medical decision making (see chart for details).  The patient's history, symptoms, physical exam are taken and consideration for diagnosis. The patient was requesting medication refill for narcotics. The patient was assessed in the [place] controlled substance  database the patient has received 280 narcotic pills of either Percocet or Vicodin since 07/22/2015. The last filled prescription was for 80 Percocet filled 6 days ago. I advised the patient that I would not refill any narcotic medications. The patient is to return to orthopedics for further narcotic descriptions if necessary. I am placing the patient on anti-inflammatories for symptom control and due to partial tears in left knee according to MRI from [facility]. I advised patient of this and she verbalizes understanding. Patient's family member was in the room and verbalizes understanding of the above. Patient still requesting narcotics and I declined to fill at this time. Patient's family member is in agreement with this plan. ____________________________________________  FINAL CLINICAL IMPRESSION(S) / ED DIAGNOSES  Final diagnoses:  Knee pain, acute, left  Hamstring tear, left, initial encounter      Darletta Moll, PA-C 08/22/15 1209  Lisa Roca, MD 08/22/15 1540

## 2015-08-22 NOTE — ED Notes (Signed)
Pt to ed with c/o left knee pain after falling last month.  Pt has been seen multiple times at [facility] and here for same.  Pt states she took her last hydrocodone today without relief.

## 2015-08-22 NOTE — Discharge Instructions (Signed)

## 2015-10-25 ENCOUNTER — Other Ambulatory Visit: Payer: Self-pay | Admitting: Physician Assistant

## 2015-10-25 DIAGNOSIS — M75121 Complete rotator cuff tear or rupture of right shoulder, not specified as traumatic: Secondary | ICD-10-CM

## 2015-11-10 ENCOUNTER — Other Ambulatory Visit: Payer: Self-pay | Admitting: Physician Assistant

## 2015-11-10 DIAGNOSIS — M5442 Lumbago with sciatica, left side: Secondary | ICD-10-CM

## 2015-11-10 DIAGNOSIS — M5441 Lumbago with sciatica, right side: Secondary | ICD-10-CM

## 2015-11-10 DIAGNOSIS — S32040A Wedge compression fracture of fourth lumbar vertebra, initial encounter for closed fracture: Secondary | ICD-10-CM

## 2015-11-11 ENCOUNTER — Ambulatory Visit
Admission: RE | Admit: 2015-11-11 | Discharge: 2015-11-11 | Disposition: A | Discharge status: Home / Self Care | Payer: Medicare Other | Source: Ambulatory Visit | Attending: Physician Assistant | Admitting: Physician Assistant

## 2015-11-11 DIAGNOSIS — M75121 Complete rotator cuff tear or rupture of right shoulder, not specified as traumatic: Secondary | ICD-10-CM | POA: Insufficient documentation

## 2015-11-11 DIAGNOSIS — M948X1 Other specified disorders of cartilage, shoulder: Secondary | ICD-10-CM | POA: Diagnosis not present

## 2015-11-11 DIAGNOSIS — S43431A Superior glenoid labrum lesion of right shoulder, initial encounter: Secondary | ICD-10-CM | POA: Insufficient documentation

## 2015-11-28 DIAGNOSIS — M75121 Complete rotator cuff tear or rupture of right shoulder, not specified as traumatic: Secondary | ICD-10-CM | POA: Insufficient documentation

## 2015-11-28 DIAGNOSIS — M12811 Other specific arthropathies, not elsewhere classified, right shoulder: Secondary | ICD-10-CM | POA: Insufficient documentation

## 2015-11-30 ENCOUNTER — Ambulatory Visit: Payer: Medicare Other

## 2015-12-12 ENCOUNTER — Other Ambulatory Visit: Payer: Self-pay | Admitting: Internal Medicine

## 2015-12-12 DIAGNOSIS — Z1231 Encounter for screening mammogram for malignant neoplasm of breast: Secondary | ICD-10-CM

## 2015-12-20 ENCOUNTER — Ambulatory Visit: Payer: Medicare Other

## 2015-12-28 ENCOUNTER — Ambulatory Visit
Admission: RE | Admit: 2015-12-28 | Discharge: 2015-12-28 | Disposition: A | Discharge status: Home / Self Care | Payer: Medicare Other | Source: Ambulatory Visit | Attending: Internal Medicine | Admitting: Internal Medicine

## 2015-12-28 DIAGNOSIS — Z1231 Encounter for screening mammogram for malignant neoplasm of breast: Secondary | ICD-10-CM | POA: Insufficient documentation

## 2016-01-09 ENCOUNTER — Ambulatory Visit: Payer: Medicare Other

## 2016-01-30 ENCOUNTER — Ambulatory Visit
Admission: RE | Admit: 2016-01-30 | Discharge: 2016-01-30 | Disposition: A | Discharge status: Home / Self Care | Payer: Medicare Other | Source: Ambulatory Visit | Attending: Physician Assistant | Admitting: Physician Assistant

## 2016-01-30 DIAGNOSIS — M5441 Lumbago with sciatica, right side: Secondary | ICD-10-CM | POA: Diagnosis not present

## 2016-01-30 DIAGNOSIS — M4806 Spinal stenosis, lumbar region: Secondary | ICD-10-CM | POA: Diagnosis not present

## 2016-01-30 DIAGNOSIS — M5127 Other intervertebral disc displacement, lumbosacral region: Secondary | ICD-10-CM | POA: Diagnosis not present

## 2016-01-30 DIAGNOSIS — M5126 Other intervertebral disc displacement, lumbar region: Secondary | ICD-10-CM | POA: Insufficient documentation

## 2016-01-30 DIAGNOSIS — M1288 Other specific arthropathies, not elsewhere classified, other specified site: Secondary | ICD-10-CM | POA: Diagnosis not present

## 2016-01-30 DIAGNOSIS — M5442 Lumbago with sciatica, left side: Secondary | ICD-10-CM

## 2016-01-30 DIAGNOSIS — M2578 Osteophyte, vertebrae: Secondary | ICD-10-CM | POA: Insufficient documentation

## 2016-01-30 DIAGNOSIS — S32040A Wedge compression fracture of fourth lumbar vertebra, initial encounter for closed fracture: Secondary | ICD-10-CM | POA: Diagnosis present

## 2016-08-23 ENCOUNTER — Other Ambulatory Visit: Payer: Self-pay | Admitting: Physician Assistant

## 2016-08-23 DIAGNOSIS — D1724 Benign lipomatous neoplasm of skin and subcutaneous tissue of left leg: Secondary | ICD-10-CM

## 2016-08-29 ENCOUNTER — Ambulatory Visit: Admission: RE | Admit: 2016-08-29 | Payer: Medicare Other | Source: Ambulatory Visit

## 2016-09-06 ENCOUNTER — Ambulatory Visit
Admission: RE | Admit: 2016-09-06 | Discharge: 2016-09-06 | Disposition: A | Discharge status: Home / Self Care | Payer: Medicare Other | Source: Ambulatory Visit | Attending: Physician Assistant | Admitting: Physician Assistant

## 2016-09-06 DIAGNOSIS — D1724 Benign lipomatous neoplasm of skin and subcutaneous tissue of left leg: Secondary | ICD-10-CM

## 2016-10-05 DIAGNOSIS — M5416 Radiculopathy, lumbar region: Secondary | ICD-10-CM | POA: Insufficient documentation

## 2016-10-05 DIAGNOSIS — N289 Disorder of kidney and ureter, unspecified: Secondary | ICD-10-CM | POA: Insufficient documentation

## 2016-10-09 ENCOUNTER — Other Ambulatory Visit: Payer: Self-pay

## 2016-10-09 ENCOUNTER — Encounter
Admission: RE | Admit: 2016-10-09 | Discharge: 2016-10-09 | Disposition: A | Discharge status: Home / Self Care | Payer: Medicare Other | Source: Ambulatory Visit | Attending: Neurosurgery | Admitting: Neurosurgery

## 2016-10-09 ENCOUNTER — Ambulatory Visit
Admission: RE | Admit: 2016-10-09 | Discharge: 2016-10-09 | Disposition: A | Discharge status: Home / Self Care | Payer: Medicare Other | Source: Ambulatory Visit | Attending: Neurosurgery | Admitting: Neurosurgery

## 2016-10-09 DIAGNOSIS — Z01818 Encounter for other preprocedural examination: Secondary | ICD-10-CM | POA: Insufficient documentation

## 2016-10-09 DIAGNOSIS — M48062 Spinal stenosis, lumbar region with neurogenic claudication: Secondary | ICD-10-CM | POA: Insufficient documentation

## 2016-10-09 DIAGNOSIS — I700 Atherosclerosis of aorta: Secondary | ICD-10-CM | POA: Diagnosis not present

## 2016-10-09 DIAGNOSIS — Z01812 Encounter for preprocedural laboratory examination: Secondary | ICD-10-CM | POA: Diagnosis not present

## 2016-10-09 HISTORY — DX: Hyperlipidemia, unspecified: E78.5

## 2016-10-09 LAB — CBC
HCT: 40.1 % (ref 35.0–47.0)
Hemoglobin: 13.3 g/dL (ref 12.0–16.0)
MCH: 29.6 pg (ref 26.0–34.0)
MCHC: 33.2 g/dL (ref 32.0–36.0)
MCV: 89.3 fL (ref 80.0–100.0)
Platelets: 275 10*3/uL (ref 150–440)
RBC: 4.49 MIL/uL (ref 3.80–5.20)
RDW: 14.8 % — ABNORMAL HIGH (ref 11.5–14.5)
WBC: 14 10*3/uL — ABNORMAL HIGH (ref 3.6–11.0)

## 2016-10-09 LAB — URINALYSIS, COMPLETE (UACMP) WITH MICROSCOPIC
BACTERIA UA: NONE SEEN
BILIRUBIN URINE: NEGATIVE
Glucose, UA: NEGATIVE mg/dL
Ketones, ur: NEGATIVE mg/dL
Leukocytes, UA: NEGATIVE
Nitrite: NEGATIVE
PH: 5 (ref 5.0–8.0)
Protein, ur: NEGATIVE mg/dL
SQUAMOUS EPITHELIAL / LPF: NONE SEEN
Specific Gravity, Urine: 1.009 (ref 1.005–1.030)
WBC, UA: NONE SEEN WBC/hpf (ref 0–5)

## 2016-10-09 LAB — BASIC METABOLIC PANEL
Anion gap: 8 (ref 5–15)
BUN: 27 mg/dL — AB (ref 6–20)
CHLORIDE: 104 mmol/L (ref 101–111)
CO2: 28 mmol/L (ref 22–32)
CREATININE: 1.18 mg/dL — AB (ref 0.44–1.00)
Calcium: 9.3 mg/dL (ref 8.9–10.3)
GFR calc Af Amer: 56 mL/min — ABNORMAL LOW (ref >60)
GFR calc non Af Amer: 48 mL/min — ABNORMAL LOW (ref >60)
Glucose, Bld: 104 mg/dL — ABNORMAL HIGH (ref 65–99)
POTASSIUM: 3.3 mmol/L — AB (ref 3.5–5.1)
Sodium: 140 mmol/L (ref 135–145)

## 2016-10-09 LAB — SURGICAL PCR SCREEN
MRSA, PCR: NEGATIVE
STAPHYLOCOCCUS AUREUS: NEGATIVE

## 2016-10-09 LAB — PROTIME-INR
INR: 0.96
Prothrombin Time: 12.8 seconds (ref 11.4–15.2)

## 2016-10-09 LAB — DIFFERENTIAL
BASOS ABS: 0.1 10*3/uL (ref 0–0.1)
Basophils Relative: 1 %
EOS ABS: 0.1 10*3/uL (ref 0–0.7)
Eosinophils Relative: 1 %
Lymphocytes Relative: 21 %
Lymphs Abs: 2.9 10*3/uL (ref 1.0–3.6)
MONOS PCT: 7 %
Monocytes Absolute: 1 10*3/uL — ABNORMAL HIGH (ref 0.2–0.9)
NEUTROS ABS: 9.8 10*3/uL — AB (ref 1.4–6.5)
NEUTROS PCT: 70 %

## 2016-10-09 LAB — APTT: aPTT: 28 seconds (ref 24–36)

## 2016-10-09 LAB — TYPE AND SCREEN
ABO/RH(D): A POS
Antibody Screen: NEGATIVE

## 2016-10-09 NOTE — Pre-Procedure Instructions (Signed)
MET B RESULTS FAXED TO OFFICE, kENDOLYN NOTIFIED SUPPLEMENT REQUESTED REPEAT k+ AM OF SURGERY ORDERED.

## 2016-10-09 NOTE — Pre-Procedure Instructions (Addendum)
Nonspecific ST abnormality noted on previous EKG 01-11-2008

## 2016-10-09 NOTE — Patient Instructions (Signed)
Your procedure is scheduled on: Monday 10/16/15 Report to [place]. 2ND FLOOR MEDICAL MALL ENTRANCE. To find out your arrival time please call [phone] between 1PM - 3PM on Friday 10/13/15.  Remember: Instructions that are not followed completely may result in serious medical risk, up to and including death, or upon the discretion of your surgeon and anesthesiologist your surgery may need to be rescheduled.    __X__ 1. Do not eat food or drink liquids after midnight. No gum chewing or hard candies.     __X__ 2. No Alcohol for 24 hours before or after surgery.   ____ 3. Bring all medications with you on the day of surgery if instructed.    __X__ 4. Notify your doctor if there is any change in your medical condition     (cold, fever, infections).             __X___5. No smoking within 24 hours of your surgery.     Do not wear jewelry, make-up, hairpins, clips or nail polish.  Do not wear lotions, powders, or perfumes.   Do not shave 48 hours prior to surgery. Men may shave face and neck.  Do not bring valuables to the hospital.    [facility] is not responsible for any belongings or valuables.               Contacts, dentures or bridgework may not be worn into surgery.  Leave your suitcase in the car. After surgery it may be brought to your room.  For patients admitted to the hospital, discharge time is determined by your                treatment team.   Patients discharged the day of surgery will not be allowed to drive home.   Please read over the following fact sheets that you were given:   Pain Booklet and MRSA Information   __X__ Take these medicines the morning of surgery with A SIP OF WATER:    1. NEXIUM (ESOMEPRAZOLE)  2. GABAPENTIN (NEURONTIN)  3. SIMVASTATIN (ZOCOR)  4. PREDNISONE  5.   6.  ____ Fleet Enema (as directed)   __X__ Use CHG Soap as directed  __X__ Use inhalers on the day of surgery AND NEBULIZER BEFORE ARRIVAL  __X__ Stop metformin 2 days prior  to surgery    ____ Take 1/2 of usual insulin dose the night before surgery and none on the morning of surgery.   ____ Stop Coumadin/Plavix/aspirin on   __X__ Stop Anti-inflammatories such as Advil, Aleve, Ibuprofen, Motrin, Naproxen, Naprosyn, Goodies,powder, or aspirin products.  OK to take Tylenol.   ____ Stop supplements until after surgery.    ____ Bring C-Pap to the hospital.

## 2016-10-10 NOTE — Pre-Procedure Instructions (Signed)
CXR results sent to Dr. Lacinda Axon and Anesthesia for review.  Also notified Dr. Lacinda Axon of [facility] are 82 (3.6-11.0).

## 2016-10-14 MED ORDER — VANCOMYCIN HCL 10 G IV SOLR
1500.0000 mg | Freq: Once | INTRAVENOUS | Status: AC
  Administered 2016-10-15: 1500 mg via INTRAVENOUS
  Filled 2016-10-14: qty 1500

## 2016-10-15 ENCOUNTER — Observation Stay
Admission: RE | Admit: 2016-10-15 | Discharge: 2016-10-16 | Disposition: A | Discharge status: Home / Self Care | Payer: Medicare Other | Source: Ambulatory Visit | Attending: Neurosurgery | Admitting: Neurosurgery

## 2016-10-15 ENCOUNTER — Encounter
Admission: RE | Disposition: A | Discharge status: Home / Self Care | Payer: Self-pay | Source: Ambulatory Visit | Attending: Neurosurgery

## 2016-10-15 ENCOUNTER — Ambulatory Visit: Payer: Medicare Other | Admitting: Anesthesiology

## 2016-10-15 ENCOUNTER — Ambulatory Visit: Payer: Medicare Other

## 2016-10-15 DIAGNOSIS — R2681 Unsteadiness on feet: Secondary | ICD-10-CM

## 2016-10-15 DIAGNOSIS — M5416 Radiculopathy, lumbar region: Secondary | ICD-10-CM | POA: Diagnosis not present

## 2016-10-15 DIAGNOSIS — Z79899 Other long term (current) drug therapy: Secondary | ICD-10-CM | POA: Diagnosis not present

## 2016-10-15 DIAGNOSIS — E785 Hyperlipidemia, unspecified: Secondary | ICD-10-CM | POA: Diagnosis not present

## 2016-10-15 DIAGNOSIS — I10 Essential (primary) hypertension: Secondary | ICD-10-CM | POA: Insufficient documentation

## 2016-10-15 DIAGNOSIS — E119 Type 2 diabetes mellitus without complications: Secondary | ICD-10-CM | POA: Diagnosis not present

## 2016-10-15 DIAGNOSIS — Z79891 Long term (current) use of opiate analgesic: Secondary | ICD-10-CM | POA: Insufficient documentation

## 2016-10-15 DIAGNOSIS — K219 Gastro-esophageal reflux disease without esophagitis: Secondary | ICD-10-CM | POA: Diagnosis not present

## 2016-10-15 DIAGNOSIS — Z7952 Long term (current) use of systemic steroids: Secondary | ICD-10-CM | POA: Diagnosis not present

## 2016-10-15 DIAGNOSIS — Z419 Encounter for procedure for purposes other than remedying health state, unspecified: Secondary | ICD-10-CM

## 2016-10-15 DIAGNOSIS — Z7984 Long term (current) use of oral hypoglycemic drugs: Secondary | ICD-10-CM | POA: Diagnosis not present

## 2016-10-15 DIAGNOSIS — J449 Chronic obstructive pulmonary disease, unspecified: Secondary | ICD-10-CM | POA: Diagnosis not present

## 2016-10-15 DIAGNOSIS — Z87891 Personal history of nicotine dependence: Secondary | ICD-10-CM | POA: Diagnosis not present

## 2016-10-15 DIAGNOSIS — M48061 Spinal stenosis, lumbar region without neurogenic claudication: Principal | ICD-10-CM | POA: Diagnosis present

## 2016-10-15 HISTORY — PX: LUMBAR LAMINECTOMY/DECOMPRESSION MICRODISCECTOMY: SHX5026

## 2016-10-15 LAB — POCT I-STAT 4, (NA,K, GLUC, HGB,HCT)
GLUCOSE: 131 mg/dL — AB (ref 65–99)
HEMATOCRIT: 40 % (ref 36.0–46.0)
HEMOGLOBIN: 13.6 g/dL (ref 12.0–15.0)
Potassium: 4.2 mmol/L (ref 3.5–5.1)
SODIUM: 139 mmol/L (ref 135–145)

## 2016-10-15 LAB — GLUCOSE, CAPILLARY
GLUCOSE-CAPILLARY: 185 mg/dL — AB (ref 65–99)
Glucose-Capillary: 120 mg/dL — ABNORMAL HIGH (ref 65–99)
Glucose-Capillary: 251 mg/dL — ABNORMAL HIGH (ref 65–99)

## 2016-10-15 LAB — ABO/RH: ABO/RH(D): A POS

## 2016-10-15 SURGERY — LUMBAR LAMINECTOMY/DECOMPRESSION MICRODISCECTOMY 2 LEVELS
Anesthesia: General | Site: Spine Lumbar | Wound class: Clean

## 2016-10-15 MED ORDER — BUPIVACAINE-EPINEPHRINE (PF) 0.5% -1:200000 IJ SOLN
INTRAMUSCULAR | Status: DC | PRN
  Administered 2016-10-15: 20 mL

## 2016-10-15 MED ORDER — OXYCODONE HCL 5 MG/5ML PO SOLN: 5.0000 mg | Freq: Once | ORAL | Status: DC | PRN

## 2016-10-15 MED ORDER — SODIUM CHLORIDE 0.9 % IV SOLN
INTRAVENOUS | Status: DC
  Administered 2016-10-15 (×2): via INTRAVENOUS

## 2016-10-15 MED ORDER — OXYCODONE HCL 5 MG PO TABS: 5.0000 mg | ORAL_TABLET | ORAL | 0 refills | Status: DC | PRN

## 2016-10-15 MED ORDER — SUGAMMADEX SODIUM 200 MG/2ML IV SOLN
INTRAVENOUS | Status: DC | PRN
  Administered 2016-10-15: 200 mg via INTRAVENOUS

## 2016-10-15 MED ORDER — FENTANYL CITRATE (PF) 100 MCG/2ML IJ SOLN
INTRAMUSCULAR | Status: AC
  Filled 2016-10-15: qty 2

## 2016-10-15 MED ORDER — DEXAMETHASONE SODIUM PHOSPHATE 10 MG/ML IJ SOLN
INTRAMUSCULAR | Status: AC
  Filled 2016-10-15: qty 1

## 2016-10-15 MED ORDER — GABAPENTIN 300 MG PO CAPS
300.0000 mg | ORAL_CAPSULE | Freq: Two times a day (BID) | ORAL | Status: DC
  Administered 2016-10-15 – 2016-10-16 (×2): 300 mg via ORAL
  Filled 2016-10-15 (×2): qty 1

## 2016-10-15 MED ORDER — LIDOCAINE 2% (20 MG/ML) 5 ML SYRINGE
INTRAMUSCULAR | Status: AC
  Filled 2016-10-15: qty 5

## 2016-10-15 MED ORDER — OXYCODONE HCL 5 MG PO TABS
5.0000 mg | ORAL_TABLET | ORAL | Status: DC | PRN
  Administered 2016-10-15 – 2016-10-16 (×3): 10 mg via ORAL
  Filled 2016-10-15 (×3): qty 2

## 2016-10-15 MED ORDER — FENTANYL CITRATE (PF) 100 MCG/2ML IJ SOLN
INTRAMUSCULAR | Status: AC
  Administered 2016-10-15: 50 ug via INTRAVENOUS
  Filled 2016-10-15: qty 2

## 2016-10-15 MED ORDER — DEXAMETHASONE SODIUM PHOSPHATE 10 MG/ML IJ SOLN
INTRAMUSCULAR | Status: DC | PRN
  Administered 2016-10-15: 10 mg via INTRAVENOUS

## 2016-10-15 MED ORDER — METHYLPREDNISOLONE ACETATE 40 MG/ML IJ SUSP
INTRAMUSCULAR | Status: AC
  Filled 2016-10-15: qty 1

## 2016-10-15 MED ORDER — SENNA 8.6 MG PO TABS: 1.0000 | ORAL_TABLET | Freq: Two times a day (BID) | ORAL | 0 refills | Status: DC

## 2016-10-15 MED ORDER — EPINEPHRINE PF 1 MG/ML IJ SOLN
INTRAMUSCULAR | Status: AC
  Filled 2016-10-15: qty 1

## 2016-10-15 MED ORDER — SIMVASTATIN 20 MG PO TABS
20.0000 mg | ORAL_TABLET | Freq: Every day | ORAL | Status: DC
Start: 2016-10-16 — End: 2016-10-16
  Administered 2016-10-16: 20 mg via ORAL
  Filled 2016-10-15: qty 1

## 2016-10-15 MED ORDER — ALBUTEROL SULFATE (2.5 MG/3ML) 0.083% IN NEBU
2.5000 mg | INHALATION_SOLUTION | RESPIRATORY_TRACT | Status: DC | PRN
Start: 2016-10-15 — End: 2016-10-16

## 2016-10-15 MED ORDER — PROPOFOL 10 MG/ML IV BOLUS
INTRAVENOUS | Status: AC
  Filled 2016-10-15: qty 20

## 2016-10-15 MED ORDER — SODIUM CHLORIDE 0.9 % IV SOLN
INTRAVENOUS | Status: DC
  Administered 2016-10-15: 07:00:00 via INTRAVENOUS

## 2016-10-15 MED ORDER — METFORMIN HCL 500 MG PO TABS
500.0000 mg | ORAL_TABLET | Freq: Two times a day (BID) | ORAL | Status: DC
  Administered 2016-10-15 – 2016-10-16 (×2): 500 mg via ORAL
  Filled 2016-10-15 (×2): qty 1

## 2016-10-15 MED ORDER — ONDANSETRON HCL 4 MG/2ML IJ SOLN
INTRAMUSCULAR | Status: DC | PRN
  Administered 2016-10-15: 4 mg via INTRAVENOUS

## 2016-10-15 MED ORDER — BACITRACIN 50000 UNITS IM SOLR
INTRAMUSCULAR | Status: DC | PRN
  Administered 2016-10-15: 1000 mL

## 2016-10-15 MED ORDER — THROMBIN 5000 UNITS EX SOLR
CUTANEOUS | Status: AC
  Filled 2016-10-15: qty 5000

## 2016-10-15 MED ORDER — OXYCODONE HCL 5 MG PO TABS: 5.0000 mg | ORAL_TABLET | Freq: Once | ORAL | Status: DC | PRN

## 2016-10-15 MED ORDER — SODIUM CHLORIDE 0.9 % IV SOLN
250.0000 mL | INTRAVENOUS | Status: DC
  Administered 2016-10-15: 250 mL via INTRAVENOUS

## 2016-10-15 MED ORDER — ONDANSETRON HCL 4 MG/2ML IJ SOLN: 4.0000 mg | INTRAMUSCULAR | Status: DC | PRN

## 2016-10-15 MED ORDER — SODIUM CHLORIDE 0.9 % IJ SOLN
INTRAMUSCULAR | Status: AC
  Filled 2016-10-15: qty 50

## 2016-10-15 MED ORDER — PREDNISONE 10 MG PO TABS
10.0000 mg | ORAL_TABLET | Freq: Every day | ORAL | Status: DC
  Administered 2016-10-16: 10 mg via ORAL
  Filled 2016-10-15: qty 1

## 2016-10-15 MED ORDER — TRIAMTERENE-HCTZ 37.5-25 MG PO TABS
1.0000 | ORAL_TABLET | Freq: Every day | ORAL | Status: DC
  Administered 2016-10-16: 1 via ORAL
  Filled 2016-10-15: qty 1

## 2016-10-15 MED ORDER — PROPOFOL 500 MG/50ML IV EMUL
INTRAVENOUS | Status: AC
  Filled 2016-10-15: qty 50

## 2016-10-15 MED ORDER — GELATIN ABSORBABLE 12-7 MM EX MISC
CUTANEOUS | Status: DC | PRN
  Administered 2016-10-15: 1

## 2016-10-15 MED ORDER — METHOCARBAMOL 500 MG PO TABS: 500.0000 mg | ORAL_TABLET | Freq: Three times a day (TID) | ORAL | 1 refills | Status: DC | PRN

## 2016-10-15 MED ORDER — FENTANYL CITRATE (PF) 100 MCG/2ML IJ SOLN
INTRAMUSCULAR | Status: DC | PRN
  Administered 2016-10-15: 100 ug via INTRAVENOUS

## 2016-10-15 MED ORDER — PANTOPRAZOLE SODIUM 40 MG PO TBEC
40.0000 mg | DELAYED_RELEASE_TABLET | Freq: Every day | ORAL | Status: DC
  Administered 2016-10-16: 40 mg via ORAL
  Filled 2016-10-15: qty 1

## 2016-10-15 MED ORDER — BACITRACIN ZINC 500 UNIT/GM EX OINT
TOPICAL_OINTMENT | CUTANEOUS | Status: AC
  Filled 2016-10-15: qty 0.9

## 2016-10-15 MED ORDER — BUPIVACAINE LIPOSOME 1.3 % IJ SUSP
INTRAMUSCULAR | Status: DC | PRN
Start: 2016-10-15 — End: 2016-10-15
  Administered 2016-10-15: 20 mL

## 2016-10-15 MED ORDER — SODIUM CHLORIDE 0.9 % IJ SOLN
INTRAMUSCULAR | Status: AC
  Filled 2016-10-15: qty 10

## 2016-10-15 MED ORDER — ROCURONIUM BROMIDE 100 MG/10ML IV SOLN
INTRAVENOUS | Status: DC | PRN
  Administered 2016-10-15: 40 mg via INTRAVENOUS

## 2016-10-15 MED ORDER — MOMETASONE FURO-FORMOTEROL FUM 100-5 MCG/ACT IN AERO
2.0000 | INHALATION_SPRAY | Freq: Two times a day (BID) | RESPIRATORY_TRACT | Status: DC
Start: 2016-10-15 — End: 2016-10-16
  Administered 2016-10-15 – 2016-10-16 (×2): 2 via RESPIRATORY_TRACT
  Filled 2016-10-15: qty 8.8

## 2016-10-15 MED ORDER — GELATIN ABSORBABLE 12-7 MM EX MISC
CUTANEOUS | Status: AC
  Filled 2016-10-15: qty 1

## 2016-10-15 MED ORDER — SODIUM CHLORIDE 0.9% FLUSH: 3.0000 mL | INTRAVENOUS | Status: DC | PRN

## 2016-10-15 MED ORDER — PHENOL 1.4 % MT LIQD
1.0000 | OROMUCOSAL | Status: DC | PRN
  Filled 2016-10-15: qty 177

## 2016-10-15 MED ORDER — MENTHOL 3 MG MT LOZG
1.0000 | LOZENGE | OROMUCOSAL | Status: DC | PRN
  Filled 2016-10-15: qty 9

## 2016-10-15 MED ORDER — NYSTATIN 100000 UNIT/GM EX CREA
1.0000 "application " | TOPICAL_CREAM | Freq: Every day | CUTANEOUS | Status: DC | PRN
  Filled 2016-10-15: qty 15

## 2016-10-15 MED ORDER — FENTANYL CITRATE (PF) 100 MCG/2ML IJ SOLN
25.0000 ug | INTRAMUSCULAR | Status: DC | PRN
  Administered 2016-10-15 (×2): 50 ug via INTRAVENOUS

## 2016-10-15 MED ORDER — MORPHINE SULFATE (PF) 2 MG/ML IV SOLN: 2.0000 mg | INTRAVENOUS | Status: DC | PRN

## 2016-10-15 MED ORDER — BUPIVACAINE LIPOSOME 1.3 % IJ SUSP
INTRAMUSCULAR | Status: AC
  Filled 2016-10-15: qty 20

## 2016-10-15 MED ORDER — METHOCARBAMOL 500 MG PO TABS
500.0000 mg | ORAL_TABLET | Freq: Three times a day (TID) | ORAL | Status: DC | PRN
  Administered 2016-10-16: 500 mg via ORAL
  Filled 2016-10-15: qty 1

## 2016-10-15 MED ORDER — THROMBIN 5000 UNITS EX SOLR
CUTANEOUS | Status: DC | PRN
  Administered 2016-10-15: 5000 [IU] via TOPICAL

## 2016-10-15 MED ORDER — SUGAMMADEX SODIUM 200 MG/2ML IV SOLN
INTRAVENOUS | Status: AC
  Filled 2016-10-15: qty 2

## 2016-10-15 MED ORDER — VANCOMYCIN HCL IN DEXTROSE 750-5 MG/150ML-% IV SOLN
750.0000 mg | Freq: Once | INTRAVENOUS | Status: AC
  Administered 2016-10-15: 750 mg via INTRAVENOUS
  Filled 2016-10-15: qty 150

## 2016-10-15 MED ORDER — MIDAZOLAM HCL 2 MG/2ML IJ SOLN
INTRAMUSCULAR | Status: AC
  Filled 2016-10-15: qty 2

## 2016-10-15 MED ORDER — BUPIVACAINE HCL (PF) 0.5 % IJ SOLN
INTRAMUSCULAR | Status: AC
  Filled 2016-10-15: qty 30

## 2016-10-15 MED ORDER — SENNA 8.6 MG PO TABS
1.0000 | ORAL_TABLET | Freq: Two times a day (BID) | ORAL | Status: DC
  Administered 2016-10-16: 8.6 mg via ORAL
  Filled 2016-10-15: qty 1

## 2016-10-15 MED ORDER — LIDOCAINE HCL (CARDIAC) 20 MG/ML IV SOLN
INTRAVENOUS | Status: DC | PRN
  Administered 2016-10-15: 100 mg via INTRAVENOUS

## 2016-10-15 MED ORDER — ONDANSETRON HCL 4 MG/2ML IJ SOLN
INTRAMUSCULAR | Status: AC
  Filled 2016-10-15: qty 2

## 2016-10-15 MED ORDER — MIDAZOLAM HCL 2 MG/2ML IJ SOLN
INTRAMUSCULAR | Status: DC | PRN
  Administered 2016-10-15 (×2): 2 mg via INTRAVENOUS

## 2016-10-15 MED ORDER — BACITRACIN 50000 UNITS IM SOLR
INTRAMUSCULAR | Status: AC
  Filled 2016-10-15: qty 1

## 2016-10-15 MED ORDER — SODIUM CHLORIDE 0.9% FLUSH: 3.0000 mL | Freq: Two times a day (BID) | INTRAVENOUS | Status: DC

## 2016-10-15 MED ORDER — ACETAMINOPHEN 325 MG PO TABS
650.0000 mg | ORAL_TABLET | Freq: Three times a day (TID) | ORAL | Status: DC
  Administered 2016-10-15 – 2016-10-16 (×4): 650 mg via ORAL
  Filled 2016-10-15 (×3): qty 2

## 2016-10-15 MED ORDER — PHENYLEPHRINE 40 MCG/ML (10ML) SYRINGE FOR IV PUSH (FOR BLOOD PRESSURE SUPPORT)
PREFILLED_SYRINGE | INTRAVENOUS | Status: AC
  Filled 2016-10-15: qty 10

## 2016-10-15 MED ORDER — PHENYLEPHRINE HCL 10 MG/ML IJ SOLN
INTRAMUSCULAR | Status: DC | PRN
  Administered 2016-10-15 (×5): 80 ug via INTRAVENOUS

## 2016-10-15 MED ORDER — PROPOFOL 10 MG/ML IV BOLUS
INTRAVENOUS | Status: DC | PRN
  Administered 2016-10-15: 170 mg via INTRAVENOUS

## 2016-10-15 MED ORDER — SODIUM CHLORIDE FLUSH 0.9 % IV SOLN
INTRAVENOUS | Status: AC
  Filled 2016-10-15: qty 10

## 2016-10-15 SURGICAL SUPPLY — 71 items
AGENT HMST MTR 8 SURGIFLO (HEMOSTASIS) ×1
APL SRG 60D 8 XTD TIP BNDBL (TIP)
BAND RUBBER 3X1/6 STRL (MISCELLANEOUS) ×1 IMPLANT
BAND RUBBER 3X1/6 TAN STRL (MISCELLANEOUS) IMPLANT
BLADE BOVIE TIP EXT 4 (BLADE) ×2 IMPLANT
BRUSH SCRUB EZ  4% CHG (MISCELLANEOUS)
BRUSH SCRUB EZ 4% CHG (MISCELLANEOUS) ×1 IMPLANT
BUR NEURO DRILL SOFT 3.0X3.8M (BURR) ×2 IMPLANT
CANISTER SUCT 1200ML W/VALVE (MISCELLANEOUS) ×2 IMPLANT
CHLORAPREP W/TINT 26ML (MISCELLANEOUS) ×4 IMPLANT
CNTNR SPEC 2.5X3XGRAD LEK (MISCELLANEOUS) ×1
CONT SPEC 4OZ STER OR WHT (MISCELLANEOUS) ×1
CONT SPEC 4OZ STRL OR WHT (MISCELLANEOUS) ×1
CONTAINER SPEC 2.5X3XGRAD LEK (MISCELLANEOUS) ×1 IMPLANT
COUNTER NEEDLE 20/40 LG (NEEDLE) ×2 IMPLANT
COVER LIGHT HANDLE STERIS (MISCELLANEOUS) ×4 IMPLANT
CUP MEDICINE 2OZ PLAST GRAD ST (MISCELLANEOUS) ×2 IMPLANT
DRAPE C-ARM 42X70 (DRAPES) ×3 IMPLANT
DRAPE LAPAROTOMY 100X77 ABD (DRAPES) ×2 IMPLANT
DRAPE MICROSCOPE LEICA (MISCELLANEOUS) ×1 IMPLANT
DRAPE POUCH INSTRU U-SHP 10X18 (DRAPES) ×2 IMPLANT
DRAPE SURG 17X11 SM STRL (DRAPES) ×2 IMPLANT
DRAPE TABLE BACK 80X90 (DRAPES) IMPLANT
DRESSING TELFA 4X3 1S ST N-ADH (GAUZE/BANDAGES/DRESSINGS) IMPLANT
DRSG TEGADERM 4X10 (GAUZE/BANDAGES/DRESSINGS) ×1 IMPLANT
DRSG TEGADERM 4X4.75 (GAUZE/BANDAGES/DRESSINGS) IMPLANT
DURASEAL APPLICATOR TIP (TIP) IMPLANT
DURASEAL SPINE SEALANT 3ML (MISCELLANEOUS) IMPLANT
ELECT CAUTERY BLADE TIP 2.5 (TIP) ×2
ELECT EZSTD 165MM 6.5IN (MISCELLANEOUS) ×2
ELECT REM PT RETURN 9FT ADLT (ELECTROSURGICAL) ×2
ELECTRODE CAUTERY BLDE TIP 2.5 (TIP) ×1 IMPLANT
ELECTRODE EZSTD 165MM 6.5IN (MISCELLANEOUS) ×1 IMPLANT
ELECTRODE REM PT RTRN 9FT ADLT (ELECTROSURGICAL) ×1 IMPLANT
GAUZE SPONGE 4X4 12PLY STRL (GAUZE/BANDAGES/DRESSINGS) ×1 IMPLANT
GAUZE XEROFORM 4X4 STRL (GAUZE/BANDAGES/DRESSINGS) ×1 IMPLANT
GLOVE BIOGEL PI IND STRL 8 (GLOVE) ×1 IMPLANT
GLOVE BIOGEL PI INDICATOR 8 (GLOVE) ×1
GLOVE SURG SYN 8.0 (GLOVE) ×4 IMPLANT
GLOVE SURG SYN 8.0 PF PI (GLOVE) ×2 IMPLANT
GOWN STRL REUS W/ TWL LRG LVL3 (GOWN DISPOSABLE) ×1 IMPLANT
GOWN STRL REUS W/ TWL XL LVL3 (GOWN DISPOSABLE) ×1 IMPLANT
GOWN STRL REUS W/TWL LRG LVL3 (GOWN DISPOSABLE) ×2
GOWN STRL REUS W/TWL XL LVL3 (GOWN DISPOSABLE) ×2
GRADUATE 1200CC STRL 31836 (MISCELLANEOUS) ×2 IMPLANT
IV CATH ANGIO 12GX3 LT BLUE (NEEDLE) ×1 IMPLANT
KIT RM TURNOVER STRD PROC AR (KITS) ×2 IMPLANT
KIT WILSON FRAME (KITS) ×2 IMPLANT
MARKER SKIN DUAL TIP RULER LAB (MISCELLANEOUS) ×5 IMPLANT
NDL SAFETY ECLIPSE 18X1.5 (NEEDLE) ×1 IMPLANT
NEEDLE HYPO 18GX1.5 SHARP (NEEDLE) ×2
NEEDLE HYPO 22GX1.5 SAFETY (NEEDLE) ×2 IMPLANT
NS IRRIG 1000ML POUR BTL (IV SOLUTION) ×2 IMPLANT
PACK LAMINECTOMY NEURO (CUSTOM PROCEDURE TRAY) ×2 IMPLANT
PAD ARMBOARD 7.5X6 YLW CONV (MISCELLANEOUS) ×2 IMPLANT
SPOGE SURGIFLO 8M (HEMOSTASIS) ×1
SPONGE SURGIFLO 8M (HEMOSTASIS) IMPLANT
STAPLER SKIN PROX 35W (STAPLE) IMPLANT
STRIP CLOSURE SKIN 1/2X4 (GAUZE/BANDAGES/DRESSINGS) IMPLANT
SUT ETHILON 3 0 PS 1 (SUTURE) ×2 IMPLANT
SUT NURALON 4 0 TR CR/8 (SUTURE) IMPLANT
SUT POLYSORB 2-0 5X18 GS-10 (SUTURE) ×2 IMPLANT
SUT VIC AB 0 CT1 18XCR BRD 8 (SUTURE) IMPLANT
SUT VIC AB 0 CT1 8-18 (SUTURE) ×4
SUT VIC AB 3-0 SH 8-18 (SUTURE) ×2 IMPLANT
SYR 20CC LL (SYRINGE) ×1 IMPLANT
SYR 30ML LL (SYRINGE) ×4 IMPLANT
SYRINGE 10CC LL (SYRINGE) ×4 IMPLANT
TOWEL OR 17X26 4PK STRL BLUE (TOWEL DISPOSABLE) ×4 IMPLANT
TRAY FOLEY W/METER SILVER 16FR (SET/KITS/TRAYS/PACK) IMPLANT
TUBING CONNECTING 10 (TUBING) ×3 IMPLANT

## 2016-10-15 NOTE — Brief Op Note (Signed)
10/15/2016  10:30 AM  PATIENT:  [name] [name]  66 y.o. female  PRE-OPERATIVE DIAGNOSIS:  lumbar stenosis with neurogenic claudication  POST-OPERATIVE DIAGNOSIS:  lumbar stenosis with neurogenic claudication  PROCEDURE:  Procedure(s): L3-L5 Laminectomy  (N/A)  SURGEON:  Surgeon(s) and Role:    * Deetta Perla, MD - Primary  PHYSICIAN ASSISTANT:   ASSISTANTS: FA RN   ANESTHESIA:   general  EBL:  Total I/O In: 1000 [I.V.:1000] Out: 450 [Blood:450]  BLOOD ADMINISTERED:none  DRAINS: none   LOCAL MEDICATIONS USED:  MARCAINE     SPECIMEN:  No Specimen  DISPOSITION OF SPECIMEN:  N/A  COUNTS:  YES  TOURNIQUET:  * No tourniquets in log *  DICTATION: .Note written in EPIC  PLAN OF CARE: Admit for overnight observation  PATIENT DISPOSITION:  PACU - hemodynamically stable.   Delay start of Pharmacological VTE agent (>24hrs) due to surgical blood loss or risk of bleeding: yes

## 2016-10-15 NOTE — Evaluation (Addendum)
Physical Therapy Evaluation Patient Details Name: [name] [name] MRN: [id number] DOB: Mar 25, 1951 Today's Date: 10/15/2016   History of Present Illness  Pt is a 66 y/o F s/p L3-5 laminectomy.  No significant PMH on file.  Clinical Impression  Patient is s/p above surgery resulting in functional limitations due to the deficits listed below (see PT Problem List). Ms. Frangipane was Ind using her cane in the community PTA.  She currently requires min guard assist for bed mobility and transfers and supervision for safe ambulation.  She was unable to recall her back precautions at the end of the session and was provided a handout.  She will have 24/7 assist/supervision from her son at d/c.  Patient will benefit from skilled PT to increase their independence and safety with mobility to allow discharge to the venue listed below.      Follow Up Recommendations Home health PT    Equipment Recommendations  None recommended by PT    Recommendations for Other Services OT consult     Precautions / Restrictions Precautions Precautions: Back;Fall Precaution Booklet Issued: Yes (comment) Precaution Comments: Pt unable to recall back precautions at end of session.  Remembered only no twisting. Required Braces or Orthoses: Spinal Brace Spinal Brace: Other (comment);Lumbar corset (applied in standing position this session due to body habitu) Restrictions Weight Bearing Restrictions: No      Mobility  Bed Mobility Overal bed mobility: Needs Assistance Bed Mobility: Rolling;Sidelying to Sit Rolling: Min guard Sidelying to sit: Min guard       General bed mobility comments: HOB flat.  Pt uses bed rail.  Mod verbal cues for log roll technique.  Transfers Overall transfer level: Needs assistance Equipment used: Rolling walker (2 wheeled) Transfers: Sit to/from Stand Sit to Stand: Min guard         General transfer comment: Cues for hand placement and technique.  Pt is slow to stand and reports pain  inferior to R breast when pushing up from bed.  Cues to back up to chair prior to sitting.  Ambulation/Gait Ambulation/Gait assistance: Supervision Ambulation Distance (Feet): 80 Feet Assistive device: Rolling walker (2 wheeled) Gait Pattern/deviations: Step-through pattern;Decreased stride length;Trunk flexed Gait velocity: decreased Gait velocity interpretation: Below normal speed for age/gender General Gait Details: Flexed trunk which improves with cues for proper RW managment and upright posture with a forward gaze. Decreased gait speed but steady, supervision for safety.   Pt reports 8/10 pain inferior to R breast and instructed to dec weight bearing through RUE.  Stairs            Wheelchair Mobility    Modified Rankin (Stroke Patients Only)       Balance Overall balance assessment: Needs assistance Sitting-balance support: No upper extremity supported;Feet supported Sitting balance-Leahy Scale: Good     Standing balance support: No upper extremity supported;During functional activity Standing balance-Leahy Scale: Fair Standing balance comment: Pt able to stand statically without UE support but relies on RW for support with dynamic activities                             Pertinent Vitals/Pain Pain Assessment: 0-10 Pain Score: 8  Pain Location: pain inferior to R chest, R thigh down to calf (chestonset following bed mob, no prior h/o pain in this area) Pain Descriptors / Indicators:  ("like a muscle pull") Pain Intervention(s): Limited activity within patient's tolerance;Monitored during session;Repositioned    Home Living Family/patient expects to  be discharged to:: Private residence Living Arrangements: Children (Son) Available Help at Discharge: Family;Available 24 hours/day Type of Home: Apartment Home Access: Level entry     Home Layout: One level Home Equipment: Toilet riser;Walker - 2 wheels;Cane - single point;Shower seat - built in (toilet  rise does not have arm rests)      Prior Function Level of Independence: Independent with assistive device(s)         Comments: Pt reports she was Ind with ADLs. She would use cane in community, was not using AD in home.  Denies any falls in the past 6 months.     Hand Dominance   Dominant Hand: Right    Extremity/Trunk Assessment   Upper Extremity Assessment Upper Extremity Assessment: Overall WFL for tasks assessed    Lower Extremity Assessment Lower Extremity Assessment: RLE deficits/detail;LLE deficits/detail RLE Deficits / Details: Strength grossly 5/5, pain from R ant thigh to R calf.  PTA pt only had pain distal to knee LLE Deficits / Details: Strength grossly 5/5.  Sensation intact.  PTA pt with impaired sensation distal to L knee    Cervical / Trunk Assessment Cervical / Trunk Assessment: Other exceptions Cervical / Trunk Exceptions: s/p surgery documented above  Communication   Communication: No difficulties  Cognition Arousal/Alertness: Awake/alert Behavior During Therapy: WFL for tasks assessed/performed Overall Cognitive Status: Within Functional Limits for tasks assessed                      General Comments General comments (skin integrity, edema, etc.): SpO2 at or above 99% on RA throughout entire session.  Assisted pt with donning back brace and instructed pt to wear at all times except when in bed, pt verbalized understanding.    Exercises Other Exercises Other Exercises: Instructed pt to ambulate here/at home at least every 45 mins to an hour and to always have her feet supported when sitting.  No dangling her feet when sitting.   Assessment/Plan    PT Assessment Patient needs continued PT services  PT Problem List Decreased activity tolerance;Decreased balance;Decreased mobility;Decreased knowledge of use of DME;Decreased safety awareness;Decreased knowledge of precautions;Pain;Impaired sensation          PT Treatment Interventions DME  instruction;Gait training;Stair training;Balance training;Functional mobility training;Therapeutic activities;Therapeutic exercise;Neuromuscular re-education;Patient/family education;Modalities    PT Goals (Current goals can be found in the Care Plan section)  Acute Rehab PT Goals Patient Stated Goal: to go home PT Goal Formulation: With patient Time For Goal Achievement: 10/22/16 Potential to Achieve Goals: Good Additional Goals Additional Goal #1: Pt will report 3/3 back precautions without cues    Frequency 7X/week   Barriers to discharge        Co-evaluation               End of Session Equipment Utilized During Treatment: Gait belt;Back brace Activity Tolerance: Patient tolerated treatment well Patient left: in chair;with call bell/phone within reach;with chair alarm set;with family/visitor present Nurse Communication: Mobility status;Precautions;Other (comment) (brace at all times except when supine)    Functional Assessment Tool Used: Clinical Judgement Functional Limitation: Mobility: Walking and moving around Mobility: Walking and Moving Around Current Status [id number]): At least 20 percent but less than 40 percent impaired, limited or restricted Mobility: Walking and Moving Around Goal Status [phone]): At least 1 percent but less than 20 percent impaired, limited or restricted    Time: [id number] PT Time Calculation (min) (ACUTE ONLY): 31 min   Charges:   PT Evaluation $PT  Eval Low Complexity: 1 Procedure PT Treatments $Gait Training: 8-22 mins   PT G Codes:   PT G-Codes **NOT FOR INPATIENT CLASS** Functional Assessment Tool Used: Clinical Judgement Functional Limitation: Mobility: Walking and moving around Mobility: Walking and Moving Around Current Status [id number]): At least 20 percent but less than 40 percent impaired, limited or restricted Mobility: Walking and Moving Around Goal Status [phone]): At least 1 percent but less than 20 percent impaired, limited or  restricted    Collie Siad PT, DPT 10/15/2016, 4:01 PM

## 2016-10-15 NOTE — Progress Notes (Signed)
Inpatient Diabetes Program Recommendations  AACE/ADA: New Consensus Statement on Inpatient Glycemic Control (2015)  Target Ranges:  Prepandial:   less than 140 mg/dL      Peak postprandial:   less than 180 mg/dL (1-2 hours)      Critically ill patients:  140 - 180 mg/dL  Results for [name] [name], [name] [name] (MRN [id number]) as of 10/15/2016 12:18  Ref. Range 10/15/2016 06:15 10/15/2016 10:39 10/15/2016 11:58  Glucose-Capillary Latest Ref Range: 65 - 99 mg/dL 120 (H) 185 (H) 251 (H)    Review of Glycemic Control  Diabetes history: DM2 Outpatient Diabetes medications: Metformin 500 mg BID Current orders for Inpatient glycemic control: Metformin 500 mg BID  Inpatient Diabetes Program Recommendations: Correction (SSI): While inpatient, please use Glycemic Control order set to order CBGs and Novolog correction scale ACHS. HgbA1C: Per Care Everywhere, last A1C 7.5% on 10/05/16.  NOTE: In reviewing chart, noted patient received Decadron 10 mg x 1 today which is contributing to hyperglycemia. Recommend using Novolog correction scale ACHS while inpatient to improve inpatient glycemic control.  Thanks, Barnie Alderman, RN, MSN, CDE Diabetes Coordinator Inpatient Diabetes Program [phone] (Team Pager from 8am to 5pm)

## 2016-10-15 NOTE — Care Management Note (Signed)
Case Management Note  Patient Details  Name: [name] [name] MRN: [id number] Date of Birth: [birth year]  Subjective/Objective: S/P L3-L5 laminectomy. Met with patient at bedside. Discussed PT recommendations of home with home health PT. She is agreeable and prefers to use [facility]. PCP is Dr. Ginette Pitman. She uses a cane and has a walker. Lives at home with her son.                  Action/Plan: Referral to [facility] for HHPT.   Expected Discharge Date:                  Expected Discharge Plan:  [place]  In-House Referral:     Discharge planning Services  CM Consult  Post Acute Care Choice:  Home Health Choice offered to:  Patient  DME Arranged:    DME Agency:     HH Arranged:  PT [place]:  [place]  Status of Service:  In process, will continue to follow  If discussed at Long Length of Stay Meetings, dates discussed:    Additional Comments:  Jolly Mango, RN 10/15/2016, 2:16 PM

## 2016-10-15 NOTE — H&P (Signed)
[name] [name] is an 66 y.o. female.    Chief Complaint: Back and Leg Pain  HPI:  [name] [name] is here for evaluation ongoing back and leg pain.  She describes the pain is gone in the right leg away from the hip to the ankle and at times into the foot.  She does not describe this the pins and needles or numbness for lumbar other pain.  The left side it will be numbness from the left knee down without pain. She has been attempting conservative measures for this pain and tried 3 steroid injections without relief.  She has not seen physical therapy but has had no relief with previous measures from them.  She is on oxycodone and gabapentin 300 twice daily at this time without relief.    She has had she has had no previous back surgery.   Past Medical History:  Diagnosis Date  . Asthma   . Diabetes mellitus without complication ([place])   . GERD (gastroesophageal reflux disease)   . Hyperlipidemia   . Hypertension     Past Surgical History:  Procedure Laterality Date  . ABDOMINAL HYSTERECTOMY    . CESAREAN SECTION    . COLONOSCOPY    . DILATION AND CURETTAGE OF UTERUS    . TUBAL LIGATION      Family History  Problem Relation Age of Onset  . Breast cancer Sister 61  . Breast cancer Cousin     maternal side 60's   Social History:  reports that she has quit smoking. She has never used smokeless tobacco. She reports that she does not drink alcohol or use drugs.  Allergies:  Allergies  Allergen Reactions  . Codeine Nausea And Vomiting  . Furosemide Other (See Comments)  . Penicillin V Potassium     Other reaction(s): Unknown  . Quinine     Other reaction(s): Unknown  . Tramadol     Other reaction(s): Unknown    Medications Prior to Admission  Medication Sig Dispense Refill  . acetaminophen (TYLENOL) 650 MG CR tablet Take 650 mg by mouth daily.    Marland Kitchen esomeprazole (NEXIUM) 40 MG capsule Take 40 mg by mouth daily at 12 noon.    . gabapentin (NEURONTIN) 300 MG capsule Take 300 mg by  mouth daily.    Marland Kitchen levalbuterol (XOPENEX) 1.25 MG/3ML nebulizer solution Inhale 1.25 mg into the lungs every 4 (four) hours as needed for wheezing or shortness of breath.     . metFORMIN (GLUCOPHAGE) 500 MG tablet Take 500 mg by mouth 2 (two) times daily with a meal.    . mometasone-formoterol (DULERA) 100-5 MCG/ACT AERO Inhale 2 puffs into the lungs 2 (two) times daily.    Marland Kitchen nystatin cream (MYCOSTATIN) Apply 1 application topically 2 (two) times daily. (Patient taking differently: Apply 1 application topically daily as needed for dry skin (rash). ) 30 g 0  . oxyCODONE-acetaminophen (PERCOCET) 5-325 MG tablet Take 1 q 4-6 hours prn pain (Patient taking differently: Take 1 tablet by mouth 2 (two) times daily. ) 30 tablet 0  . predniSONE (DELTASONE) 10 MG tablet Take 10 mg by mouth daily.    . simvastatin (ZOCOR) 20 MG tablet Take 20 mg by mouth daily.    Marland Kitchen triamterene-hydrochlorothiazide (DYAZIDE) 37.5-25 MG capsule Take 1 capsule by mouth daily.    Marland Kitchen gabapentin (NEURONTIN) 100 MG capsule Take 1 capsule (100 mg total) by mouth 3 (three) times daily. Maximum 3 pills 3 times daily. (Patient not taking: Reported on 10/04/2016)  90 capsule 0  . meloxicam (MOBIC) 15 MG tablet Take 1 tablet (15 mg total) by mouth daily. (Patient not taking: Reported on 10/04/2016) 30 tablet 0  . predniSONE (STERAPRED UNI-PAK 21 TAB) 10 MG (21) TBPK tablet 6 tablets on day 1, 5 tablets on day 2, 4 tablets on day 3, etc... (Patient not taking: Reported on 10/15/2016) 21 tablet 0    Results for orders placed or performed during the hospital encounter of 10/15/16 (from the past 48 hour(s))  Glucose, capillary     Status: Abnormal   Collection Time: 10/15/16  6:15 AM  Result Value Ref Range   Glucose-Capillary 120 (H) 65 - 99 mg/dL  I-STAT 4, (NA,K, GLUC, HGB,HCT)     Status: Abnormal   Collection Time: 10/15/16  6:42 AM  Result Value Ref Range   Sodium 139 135 - 145 mmol/L   Potassium 4.2 3.5 - 5.1 mmol/L   Glucose, Bld 131  (H) 65 - 99 mg/dL   HCT 40.0 36.0 - 46.0 %   Hemoglobin 13.6 12.0 - 15.0 g/dL   No results found.  ROS  General ROS: Negative Psychological ROS: Negative Ophthalmic ROS: Negative ENT ROS: Negative Hematological and Lymphatic ROS: Negative  Endocrine ROS: Negative Respiratory ROS: Negative Cardiovascular ROS: Negative Gastrointestinal ROS: Negative Genito-Urinary ROS: Negative Musculoskeletal ROS: Positive for back pain Neurological ROS: Positive for leg pain Dermatological ROS: Negative  Blood pressure (!) 155/72, pulse 89, temperature 98.1 F (36.7 C), temperature source Tympanic, resp. rate 18, height 4\' 11"  (1.499 m), weight 98.4 kg (217 lb), SpO2 100 %. Physical Exam  General appearance: Alert, cooperative, in no acute distress Head: Normocephalic, atraumatic Eyes: Normal, EOM intact Oropharynx: Moist without lesions Neck: Supple, no tenderness Heart: Normal, regular rate and rhythm, systolic murmur present Lungs: Clear to auscultation, good air exchange Abdomen: Soft, nondistended Ext: No edema in LE bilaterally  Neurologic exam:  Mental status: alertness: alert, affect: normal Speech: fluent and clear Motor:strength symmetric 5/5, normal muscle mass and tone in all extremities  Sensory: Decreased light touch on her lateral left leg Reflexes: 1+ and symmetric bilaterally for arms and legs Gait: normal   Imaging: MRI lumbar spine: There is degenerative disc disease seen throughout the lumbar spine.  There is overall normal lordosis and good alignment.  There is central stenosis located from L3-S1 due to significant epidural lipomatosis and hypertrophy of ligamentum flavum.  There is some lateral recess stenosis at these levels 2.  Does not appear to be significant neuroforaminal stenosis.    Assessment/Plan I discussed with [name] [name] that I believe a lot of her symptoms are related to her lumbar stenosis.  I discussed with her  L3-L5 laminectomies for  decompression without fusion in order to relieve the symptoms.  I explained the risks of hematoma, infection, need for future surgery, risk of anesthesia, stroke, damage to nerve roots, and back pain.  She expressed understanding.    1.  Diagnosis : Lumbar stenosis with neurogenic claudication  2.  Plan -We will plan for L3-5 laminectomies for decompression  Deetta Perla, MD 10/15/2016, 6:46 AM

## 2016-10-15 NOTE — Op Note (Signed)
SURGERY DATE: 09/17/2016  PRE-OP DIAGNOSIS: Lumbar Stenosis with Radiculopathy(m48.062)  POST-OP DIAGNOSIS:Post-Op Diagnosis Codes: Lumbar Stenosis with Radiculopathy (m48.062)  Procedure(s) with comments: L3-5 Laminectomies Foraminotomies Right L4/5, L5/S1  SURGEON:  * Malen Gauze, MD   ANESTHESIA:General  OPERATIVE FINDINGS: Hypertrophied Ligament and Foraminal Stenosis, Compression at L4/5 and L5/S1  OPERATIVE REPORT:   Indication: [name] presented to the clinic on 9/26 with ongoing bilateral leg pain and back pain.She was having R>L pain with standing and numbness. She had failed conservative management including Pt, OTC medications, and prescription medications. MRI revealed stenosis at L3/4, L4/5, and L5/S1. Laminectomy was discussed to relieve symptoms. Therisks of surgery were explained to include hematoma, infection, damage to nerve roots, CSF leak, weakness, numbness, pain, need for future surgery including fusion, heart attack, and stroke. She elected to proceed with surgery for symptom relief.   Procedure The patient was brought to the OR after informed consent was obtained. She was given general anesthesia and intubated by the anesthesia service. Vascular access lines were placed.The patient was then placed prone on a Wilson frameensuring all pressure points were padded. A time-out was performed per protocol.   The patient was sterilely prepped and draped. Fluoroscopy confirmed L5/S1lamina and incision was planned.The incision was instilled withlocal anesthetic with epinephrine. The skin was opened sharply and the dissection taken to the fascia. This was incised and cautery was used to dissect the subperiosteal plane to expose the spinous processes and lamina of L3- L5. Retractors were inserted and hemostasis was achieved. X-ray was used to confirm location.  The spinous processes of L3, L4, and L5 were removed with rongeurs. Next, a  matchstickdrill bit was used to remove the inferior L3 lamina and entire L4 and L5 laminas to expose the ligament. The underlying ligament was freed and removed with combination of rongeurs starting medially to laterally. The laterall recesses were decompressed and all ligament and hypertrophied facets removed medially. Attention was given to the right lateral recess and the L4/5 and L5/S1 foramina. An upgoing curette was used to remove ligament and bone in the foramen on the right. The nerve roots were followed out each foramen from L3/4 to L5/S1 with a blunt instrument and found to be decompressed. The caudal and rostral ends were again examined to ensure central decompression.   Once the dura appeared free from all the bone edges and all soft tissue compression was removed, hemostasis was obtained with Floseal and cautery. The wound was irrigated profusely. Depomedrol was placed in the lateral recess. The muscle was then closed using 0 vicryl followed by the fascia with 0 vicryl. Local anestheticwas injected into the muscle. Exparel was injected into the subcutaneous tissue. Next, multiple subcutaneous and dermal layers were closed with 2-0 vicryl and 3-0 vicryluntil the epidermis was well approximated. The skin was closed with 3-0 Nylon. A dressing was applied.  The patient was returned to supine position and extubated by the anesthesia service. The patient was then taken to the PACU for post-operative care where she was moving extremities symmetrically.   ESTIMATED BLOOD LOSS: 450 cc  SPECIMENS None  IMPLANT None   I performed the case in its entirety  Deetta Perla, [place]

## 2016-10-15 NOTE — Anesthesia Preprocedure Evaluation (Signed)
Anesthesia Evaluation  Patient identified by MRN, date of birth, ID band Patient awake    Reviewed: Allergy & Precautions, H&P , NPO status , Patient's Chart, lab work & pertinent test results  History of Anesthesia Complications Negative for: history of anesthetic complications  Airway Mallampati: III  TM Distance: >3 FB Neck ROM: limited    Dental no notable dental hx. (+) Poor Dentition, Chipped, Missing, Edentulous Upper, Partial Lower   Pulmonary shortness of breath and with exertion, asthma , COPD,  COPD inhaler, former smoker,    Pulmonary exam normal breath sounds clear to auscultation       Cardiovascular Exercise Tolerance: Good hypertension, (-) angina(-) Past MI and (-) DOE Normal cardiovascular exam Rhythm:regular Rate:Normal     Neuro/Psych negative neurological ROS  negative psych ROS   GI/Hepatic Neg liver ROS, GERD  Controlled and Medicated,  Endo/Other  diabetes, Type 2, Oral Hypoglycemic Agents  Renal/GU      Musculoskeletal   Abdominal   Peds  Hematology negative hematology ROS (+)   Anesthesia Other Findings Past Medical History: No date: Asthma No date: Diabetes mellitus without complication ([facility]) No date: GERD (gastroesophageal reflux disease) No date: Hyperlipidemia No date: Hypertension  Past Surgical History: No date: ABDOMINAL HYSTERECTOMY No date: CESAREAN SECTION No date: COLONOSCOPY No date: DILATION AND CURETTAGE OF UTERUS No date: TUBAL LIGATION  BMI    Body Mass Index:  43.83 kg/m      Reproductive/Obstetrics negative OB ROS                             Anesthesia Physical Anesthesia Plan  ASA: III  Anesthesia Plan: General ETT   Post-op Pain Management:    Induction:   Airway Management Planned:   Additional Equipment:   Intra-op Plan:   Post-operative Plan:   Informed Consent: I have reviewed the patients History and Physical,  chart, labs and discussed the procedure including the risks, benefits and alternatives for the proposed anesthesia with the patient or authorized representative who has indicated his/her understanding and acceptance.   Dental Advisory Given  Plan Discussed with: Anesthesiologist, CRNA and Surgeon  Anesthesia Plan Comments:         Anesthesia Quick Evaluation

## 2016-10-15 NOTE — Anesthesia Procedure Notes (Signed)
Procedure Name: Intubation Date/Time: 10/15/2016 7:43 AM Performed by: Darlyne Russian Pre-anesthesia Checklist: Patient identified, Emergency Drugs available, Suction available, Patient being monitored and Timeout performed Patient Re-evaluated:Patient Re-evaluated prior to inductionOxygen Delivery Method: Circle system utilized Preoxygenation: Pre-oxygenation with 100% oxygen Intubation Type: IV induction Ventilation: Mask ventilation without difficulty Laryngoscope Size: Mac and 3 Grade View: Grade I Tube type: Oral Number of attempts: 1 Airway Equipment and Method: Stylet Placement Confirmation: ETT inserted through vocal cords under direct vision,  positive ETCO2 and breath sounds checked- equal and bilateral Secured at: 22 cm Tube secured with: Tape Dental Injury: Teeth and Oropharynx as per pre-operative assessment

## 2016-10-15 NOTE — Discharge Instructions (Signed)
NEUROSURGERY DISCHARGE INSTRUCTIONS  Admission Diagnosis: Lumbar Stenosis w/ Neurogenic Claudication  Discharge Diagnosis:  Lumbar Stenosis w/ Neurogenic Claudication  Operative procedure: L3-5 Laminectomies, Right L4/5, L5/S1 Foraminotomies  The following are instructions to help in your recovery once you have been discharged from the hospital. Even if you feel well, it is important that you follow these activity guidelines. If you do not let your neck heal properly from the surgery, you can increase the chance of return of your symptoms and other complications.   What to do after you leave the hospital:  Recommended diet:  Increase protein intake to promote wound healing. You may return to your usual diabetic diet. Be sure to stay hydrated.   Recommended activity: No bending, lifting, or twisting (BLT). Avoid lifting objects heavier than 10 pounds (gallon milk jug). Where possible, avoid household activities that involve lifting, bending, reaching, pushing, or pulling such as laundry, vacuuming, grocery shopping, and childcare. Try to arrange for help from friends and family for these activities while your back heals.   Increase physical activity slowly as tolerated. Taking short walks is encouraged, but avoid strenuous exercise. Do not jog, run, bicycle, lift weights, or participate in any other exercises unless specifically allowed by your doctor.   You should not drive until cleared by your doctor.   Until released by your doctor, you should not return to work or school. You should rest at home and let your body heal.   You may shower the day after your surgery. After showering, lightly dab your incision dry. Do not take a tub bath or go swimming until approved by your doctor at your follow-up appointment.   If you smoke, we strongly recommend that you quit. Smoking has been proven to interfere with normal bone healing and will dramatically reduce the success rate of your surgery.  Please contact [facility] (800-QUIT-NOW) and use the resources at www.[facility].com for assistance in stopping smoking.   Medications  Do not restart Aspirin until seven days after surgery  You may restart home medications.   Special Instructions  If you have a dressing on your incision, you may remove it two days after your surgery. Keep your incision area clean and dry.   If you have staples or stitches on your incision, you should have a follow up scheduled for removal.   Please Report any of the following: You may experience pain in your back. This is normal and should improve in the next few weeks with the help of pain medication, muscle relaxers, and rest. Some patients report that a warm compress on the back helps.   However, should you experience any of the following, contact us immediately:   New numbness or weakness   Pain that is progressively getting worse, and is not relieved by your pain medication, muscle relaxers, rest, and warm compresses   Bleeding, redness, swelling, pain, or drainage from surgical incision   Chills or flu-like symptoms   Fever greater than 101.0 F (38.3 C)   Inability to eat, drink fluids, or take medications   Problems with bowel or bladder functions   Difficulty breathing or shortness of breath   Warmth, tenderness, or swelling in your calf    Additional Follow up appointments During office hours (Monday-Friday 9 am to 5 pm), please call your physician at [phone]  After hours and weekends, please call the [facility] Operator at  [phone] and ask for the Neurosurgeon On Call   For a life-threatening emergency, call 911  Please see below for scheduled appointments: 10/30/16 with Dr. Lacinda Axon in [place] [facility]

## 2016-10-15 NOTE — Progress Notes (Signed)
Pharmacy Antibiotic Note  [name] [name] is a 66 y.o. female s/p lamincetomy on 10/15/2016.  Pharmacy has been consulted for vancomycin dosing for surgical prophylaxis to continue x 12 hours post-op or longer if patient has a drain.  Plan: Vancomycin 1500 mg iv once preop then 750 mg iv once 12 hours later. Will continue with vancomycin 750 mg iv q 12 hours if patient has a drain.   Height: 4\' 11"  (149.9 cm) Weight: 217 lb (98.4 kg) IBW/kg (Calculated) : 43.2  Temp (24hrs), Avg:98 F (36.7 C), Min:97.2 F (36.2 C), Max:99.2 F (37.3 C)   Recent Labs Lab 10/09/16 0929  WBC 14.0*  CREATININE 1.18*    Estimated Creatinine Clearance: 50.3 mL/min (by C-G formula based on SCr of 1.18 mg/dL (H)).    Allergies  Allergen Reactions  . Codeine Nausea And Vomiting  . Furosemide Other (See Comments)  . Penicillin V Potassium     Other reaction(s): Unknown  . Quinine     Other reaction(s): Unknown  . Tramadol     Other reaction(s): Unknown    Thank you for allowing pharmacy to be a part of this patient's care.  Ulice Dash D 10/15/2016 12:19 PM

## 2016-10-15 NOTE — Transfer of Care (Signed)
Immediate Anesthesia Transfer of Care Note  Patient: [name] [name]  Procedure(s) Performed: Procedure(s): L3-L5 Laminectomy  (N/A)  Patient Location: PACU  Anesthesia Type:General  Level of Consciousness: patient cooperative and responds to stimulation  Airway & Oxygen Therapy: Patient Spontanous Breathing and Patient connected to nasal cannula oxygen  Post-op Assessment: Report given to RN and Post -op Vital signs reviewed and stable  Post vital signs: Reviewed and stable  Last Vitals:  Vitals:   10/15/16 0607 10/15/16 1036  BP: (!) 155/72 125/65  Pulse: 89 74  Resp: 18 17  Temp: 36.7 C 36.2 C    Last Pain:  Vitals:   10/15/16 1036  TempSrc:   PainSc: Asleep         Complications: No apparent anesthesia complications

## 2016-10-16 DIAGNOSIS — M48061 Spinal stenosis, lumbar region without neurogenic claudication: Secondary | ICD-10-CM | POA: Diagnosis not present

## 2016-10-16 NOTE — Progress Notes (Signed)
Physical Therapy Treatment Patient Details Name: [name] [name] MRN: [id number] DOB: [birth year] Today's Date: 10/16/2016    History of Present Illness Pt is a 66 y/o F s/p L3-5 laminectomy.  No significant PMH on file.    PT Comments    Pt agrees to session.  Was able to recall 2/3 precautions and 1/3 with verbal cues to remember.  To edge of bed with min vc's for log rolling techniques.  She needed assist to don brace. Stood with min a x 1 and was able to ambuate 100' with a stop in bathroom to void with supervision.  Pt educated to wait for staff prior to mobility.  No lob's or knees buckling noted.   Follow Up Recommendations  Home health PT     Equipment Recommendations  None recommended by PT    Recommendations for Other Services       Precautions / Restrictions Precautions Precautions: Back;Fall Precaution Comments: able to recall 2/3 precautions Required Braces or Orthoses: Spinal Brace Spinal Brace: Other (comment);Lumbar corset Restrictions Weight Bearing Restrictions: No    Mobility  Bed Mobility Overal bed mobility: Needs Assistance Bed Mobility: Rolling;Sidelying to Sit Rolling: Min guard Sidelying to sit: Min guard       General bed mobility comments: HOB flat.  Pt uses bed rail.  Mod verbal cues for log roll technique.  Transfers Overall transfer level: Needs assistance Equipment used: Rolling walker (2 wheeled) Transfers: Sit to/from Stand Sit to Stand: Min guard         General transfer comment: Cues for hand placement and technique.  Pt is slow to stand and reports pain inferior to R breast when pushing up from bed.  Cues to back up to chair prior to sitting.  Ambulation/Gait Ambulation/Gait assistance: Supervision Ambulation Distance (Feet): 100 Feet Assistive device: Rolling walker (2 wheeled) Gait Pattern/deviations: Step-through pattern;Decreased stride length;Trunk flexed Gait velocity: decreased Gait velocity interpretation: Below  normal speed for age/gender     Stairs            Wheelchair Mobility    Modified Rankin (Stroke Patients Only)       Balance Overall balance assessment: Needs assistance Sitting-balance support: No upper extremity supported;Feet supported Sitting balance-Leahy Scale: Good     Standing balance support: No upper extremity supported;During functional activity Standing balance-Leahy Scale: Fair Standing balance comment: Pt able to stand statically without UE support but relies on RW for support with dynamic activities                    Cognition Arousal/Alertness: Awake/alert Behavior During Therapy: WFL for tasks assessed/performed Overall Cognitive Status: Within Functional Limits for tasks assessed                      Exercises Other Exercises Other Exercises: to bathroom with supervision.  Pt instructed to wait for staff before standing.    General Comments        Pertinent Vitals/Pain Pain Assessment: 0-10 Pain Score: 4  Pain Location: pain inferior to R chest, R thigh down to calf Pain Descriptors / Indicators: Sharp;Discomfort Pain Intervention(s): Limited activity within patient's tolerance    Home Living                      Prior Function            PT Goals (current goals can now be found in the care plan section) Acute Rehab PT Goals Patient  Stated Goal: to go home Progress towards PT goals: Progressing toward goals    Frequency    7X/week      PT Plan      Co-evaluation             End of Session Equipment Utilized During Treatment: Gait belt;Back brace Activity Tolerance: Patient tolerated treatment well Patient left: in chair;with call bell/phone within reach;with chair alarm set;with family/visitor present     Time: [id number] PT Time Calculation (min) (ACUTE ONLY): 23 min  Charges:  $Gait Training: 8-22 mins $Therapeutic Activity: 8-22 mins                    G Codes:      Chesley Noon 10/23/2016, 10:08 AM

## 2016-10-16 NOTE — Care Management Obs Status (Signed)
[place] NOTIFICATION   Patient Details  Name: [name] [name] MRN: [id number] Date of Birth: 1951/09/10   Medicare Observation Status Notification Given:  Yes    Jolly Mango, RN 10/16/2016, 10:53 AM

## 2016-10-16 NOTE — Progress Notes (Signed)
Reviewed discharge summary with verbal understanding, answered all questions. No follow up appts at this time. Instructed patient to follow up PCP and surgeon's office within one week. RX given. Changed incision drsg per verbal order.

## 2016-10-16 NOTE — Discharge Summary (Signed)
Physician Discharge Summary  Patient ID: [name] [name] MRN: [id number] DOB/AGE: 64-Aug-1952 64 y.o.  Admit date: 10/15/2016 Discharge date: 10/16/2016  Admission Diagnoses:Lumbar Stenosis with Radiculopathy(m48.062)  Discharge Diagnoses: Lumbar Stenosis with Radiculopathy(m48.062)  Active Problems:   Lumbar stenosis   Discharged Condition: good  Hospital Course:  [name] [name] was taken to the operating room on 1/8 for L3-5 laminectomies. There were no complications and she was taken to the PACU and ward for post-op care. There she was seen to be moving legs at neurological baseline. She was having some pain in her back which was controlled with medications. She was up ambulating with PT and able to void in bathroom. Overnight, there were no issues. On POD#1, she was seen to be stable with some mild back pain. PT recommending home health Pt. She was tolerating a diet and given no further needs, was able to be discharged home.   Consults: None  Significant Diagnostic Studies: None  Treatments: therapies: PT  Discharge Exam: Blood pressure 96/72, pulse 68, temperature 98.6 F (37 C), temperature source Oral, resp. rate 18, height 4\' 11"  (1.499 m), weight 98.4 kg (217 lb), SpO2 100 %.  Awake, alert 5/5 strength in bilateral lower extremities Sensation grossly intact in legs Dressing on back with moderate staining, changed  Disposition: 01-Home or Self Care Discharge Instructions    Diet - low sodium heart healthy    Complete by:  As directed    Increase activity slowly    Complete by:  As directed      Allergies as of 10/16/2016      Reactions   Codeine Nausea And Vomiting   Furosemide Other (See Comments)   Penicillin V Potassium    Other reaction(s): Unknown   Quinine    Other reaction(s): Unknown   Tramadol    Other reaction(s): Unknown      Medication List    STOP taking these medications   oxyCODONE-acetaminophen 5-325 MG tablet Commonly known as:  PERCOCET      TAKE these medications   acetaminophen 650 MG CR tablet Commonly known as:  TYLENOL Take 650 mg by mouth daily.   esomeprazole 40 MG capsule Commonly known as:  NEXIUM Take 40 mg by mouth daily at 12 noon.   gabapentin 300 MG capsule Commonly known as:  NEURONTIN Take 300 mg by mouth daily. What changed:  Another medication with the same name was removed. Continue taking this medication, and follow the directions you see here.   levalbuterol 1.25 MG/3ML nebulizer solution Commonly known as:  XOPENEX Inhale 1.25 mg into the lungs every 4 (four) hours as needed for wheezing or shortness of breath.   meloxicam 15 MG tablet Commonly known as:  MOBIC Take 1 tablet (15 mg total) by mouth daily.   metFORMIN 500 MG tablet Commonly known as:  GLUCOPHAGE Take 500 mg by mouth 2 (two) times daily with a meal.   methocarbamol 500 MG tablet Commonly known as:  ROBAXIN Take 1 tablet (500 mg total) by mouth every 8 (eight) hours as needed for muscle spasms.   mometasone-formoterol 100-5 MCG/ACT Aero Commonly known as:  DULERA Inhale 2 puffs into the lungs 2 (two) times daily.   nystatin cream Commonly known as:  MYCOSTATIN Apply 1 application topically 2 (two) times daily. What changed:  when to take this  reasons to take this   oxyCODONE 5 MG immediate release tablet Commonly known as:  Oxy IR/ROXICODONE Take 1-2 tablets (5-10 mg total) by mouth every  4 (four) hours as needed for moderate pain (5 mg for 4-6/10 pain, 10 mg for 7-8/10 pain).   predniSONE 10 MG tablet Commonly known as:  DELTASONE Take 10 mg by mouth daily. What changed:  Another medication with the same name was removed. Continue taking this medication, and follow the directions you see here.   senna 8.6 MG Tabs tablet Commonly known as:  SENOKOT Take 1 tablet (8.6 mg total) by mouth 2 (two) times daily.   simvastatin 20 MG tablet Commonly known as:  ZOCOR Take 20 mg by mouth daily.    triamterene-hydrochlorothiazide 37.5-25 MG capsule Commonly known as:  DYAZIDE Take 1 capsule by mouth daily.      Follow-up: Follow up with Dr Lacinda Axon on 1/23 in [place] [facility]  Signed: Deetta Perla 10/16/2016, 12:56 PM

## 2016-10-16 NOTE — Progress Notes (Signed)
Inpatient Diabetes Program Recommendations  AACE/ADA: New Consensus Statement on Inpatient Glycemic Control (2015)  Target Ranges:  Prepandial:   less than 140 mg/dL      Peak postprandial:   less than 180 mg/dL (1-2 hours)      Critically ill patients:  140 - 180 mg/dL   Results for [name] [name], [name] [name] (MRN [id number]) as of 10/16/2016 09:39  Ref. Range 10/15/2016 06:15 10/15/2016 10:39 10/15/2016 11:58  Glucose-Capillary Latest Ref Range: 65 - 99 mg/dL 120 (H) 185 (H) 251 (H)   Review of Glycemic Control  Diabetes history: DM2 Outpatient Diabetes medications: Metformin 500 mg BID Current orders for Inpatient glycemic control: Metformin 500 mg BID  Inpatient Diabetes Program Recommendations: Correction (SSI): While inpatient, please use Glycemic Control order set to order CBGs and Novolog correction scale ACHS. HgbA1C: Per Care Everywhere, last A1C 7.5% on 10/05/16.  Thanks, Barnie Alderman, RN, MSN, CDE Diabetes Coordinator Inpatient Diabetes Program [phone] (Team Pager from 8am to 5pm)

## 2016-10-16 NOTE — Care Management Note (Signed)
Case Management Note  Patient Details  Name: [name] [name] MRN: [id number] Date of Birth: 08-23-1951  Subjective/Objective:  Discharging today                 Action/Plan: Advanced notified of discharge  Expected Discharge Date:  10/16/16               Expected Discharge Plan:  [place]  In-House Referral:     Discharge planning Services  CM Consult  Post Acute Care Choice:  Home Health Choice offered to:  Patient  DME Arranged:    DME Agency:     HH Arranged:  PT [place]:  [facility]  Status of Service:  Completed, signed off  If discussed at [place] of Stay Meetings, dates discussed:    Additional Comments:  Jolly Mango, RN 10/16/2016, 12:10 PM

## 2016-10-17 ENCOUNTER — Encounter: Payer: Self-pay | Admitting: *Deleted

## 2016-10-17 ENCOUNTER — Emergency Department: Payer: Medicare Other

## 2016-10-17 ENCOUNTER — Emergency Department
Admission: EM | Admit: 2016-10-17 | Discharge: 2016-10-17 | Disposition: A | Discharge status: Home / Self Care | Payer: Medicare Other | Attending: Emergency Medicine | Admitting: Emergency Medicine

## 2016-10-17 DIAGNOSIS — Z79899 Other long term (current) drug therapy: Secondary | ICD-10-CM | POA: Insufficient documentation

## 2016-10-17 DIAGNOSIS — I10 Essential (primary) hypertension: Secondary | ICD-10-CM | POA: Diagnosis not present

## 2016-10-17 DIAGNOSIS — G8918 Other acute postprocedural pain: Secondary | ICD-10-CM | POA: Diagnosis present

## 2016-10-17 DIAGNOSIS — J45909 Unspecified asthma, uncomplicated: Secondary | ICD-10-CM | POA: Diagnosis not present

## 2016-10-17 DIAGNOSIS — E119 Type 2 diabetes mellitus without complications: Secondary | ICD-10-CM | POA: Insufficient documentation

## 2016-10-17 DIAGNOSIS — R0789 Other chest pain: Secondary | ICD-10-CM | POA: Diagnosis not present

## 2016-10-17 DIAGNOSIS — Z87891 Personal history of nicotine dependence: Secondary | ICD-10-CM | POA: Diagnosis not present

## 2016-10-17 DIAGNOSIS — Z7984 Long term (current) use of oral hypoglycemic drugs: Secondary | ICD-10-CM | POA: Insufficient documentation

## 2016-10-17 LAB — BASIC METABOLIC PANEL
Anion gap: 10 (ref 5–15)
BUN: 41 mg/dL — AB (ref 6–20)
CO2: 22 mmol/L (ref 22–32)
CREATININE: 1.54 mg/dL — AB (ref 0.44–1.00)
Calcium: 8.5 mg/dL — ABNORMAL LOW (ref 8.9–10.3)
Chloride: 104 mmol/L (ref 101–111)
GFR calc Af Amer: 40 mL/min — ABNORMAL LOW (ref >60)
GFR, EST NON AFRICAN AMERICAN: 35 mL/min — AB (ref >60)
Glucose, Bld: 181 mg/dL — ABNORMAL HIGH (ref 65–99)
Potassium: 4.6 mmol/L (ref 3.5–5.1)
SODIUM: 136 mmol/L (ref 135–145)

## 2016-10-17 LAB — CBC
HCT: 34.9 % — ABNORMAL LOW (ref 35.0–47.0)
Hemoglobin: 11.4 g/dL — ABNORMAL LOW (ref 12.0–16.0)
MCH: 29.8 pg (ref 26.0–34.0)
MCHC: 32.5 g/dL (ref 32.0–36.0)
MCV: 91.7 fL (ref 80.0–100.0)
PLATELETS: 242 10*3/uL (ref 150–440)
RBC: 3.81 MIL/uL (ref 3.80–5.20)
RDW: 15 % — ABNORMAL HIGH (ref 11.5–14.5)
WBC: 19.2 10*3/uL — AB (ref 3.6–11.0)

## 2016-10-17 LAB — TROPONIN I: Troponin I: <0.03 ng/mL (ref <0.03)

## 2016-10-17 MED ORDER — MORPHINE SULFATE (PF) 4 MG/ML IV SOLN
4.0000 mg | Freq: Once | INTRAVENOUS | Status: AC
  Administered 2016-10-17: 4 mg via INTRAMUSCULAR
  Filled 2016-10-17: qty 1

## 2016-10-17 NOTE — ED Notes (Signed)
ED Provider at bedside. 

## 2016-10-17 NOTE — ED Notes (Signed)
Pt present from home via ems son at the bedside. Pt had back surgery yesterday. Back brace in place noted rubber near breast. Noted dressings to lower mid back serous sanious drainage. Noted reproducible pain to right breast with palpation.

## 2016-10-17 NOTE — Anesthesia Postprocedure Evaluation (Signed)
Anesthesia Post Note  Patient: [name]  Procedure(s) Performed: Procedure(s) (LRB): L3-L5 Laminectomy  (N/A)  Patient location during evaluation: PACU Anesthesia Type: General Level of consciousness: awake and alert Pain management: pain level controlled Vital Signs Assessment: post-procedure vital signs reviewed and stable Respiratory status: spontaneous breathing, nonlabored ventilation, respiratory function stable and patient connected to nasal cannula oxygen Cardiovascular status: blood pressure returned to baseline and stable Postop Assessment: no signs of nausea or vomiting Anesthetic complications: no    Last Pain:  Vitals:   10/16/16 1306  TempSrc:   PainSc: Asleep                 Precious Haws Vermon Grays

## 2016-10-17 NOTE — ED Provider Notes (Signed)
[facility] Emergency Department Provider Note  ____________________________________________   First MD Initiated Contact with Patient 10/17/16 [phone]     (approximate)  I have reviewed the triage vital signs and the nursing notes.   HISTORY  Chief Complaint Back Pain    HPI [name] [name] is a 66 y.o. female who presents for evaluation of right sided chest pain.  She had back surgery 2 days ago by Dr. Lacinda Axon.  She was discharged on post-op day 1 (yesterday) and states that she was still having some pain in her back and the right side of her chest.  She feels like they should have kept her until she was pain free.  She states the pain is in the right side of her chest, just below her breast, along where her back brace is located.  Moving around makes it worse, as does pressing on her chest wall.  Remaining still makes it a bit better.  She denies fever/chills, shortness of breath, abdominal pain, N/V/D, dysuria.  Her back aches a little bit but not much.  She describes the pain as severe.   Past Medical History:  Diagnosis Date  . Asthma   . Diabetes mellitus without complication ([place])   . GERD (gastroesophageal reflux disease)   . Hyperlipidemia   . Hypertension     Patient Active Problem List   Diagnosis Date Noted  . Lumbar stenosis 10/15/2016    Past Surgical History:  Procedure Laterality Date  . ABDOMINAL HYSTERECTOMY    . CESAREAN SECTION    . COLONOSCOPY    . DILATION AND CURETTAGE OF UTERUS    . LUMBAR LAMINECTOMY/DECOMPRESSION MICRODISCECTOMY N/A 10/15/2016   Procedure: L3-L5 Laminectomy ;  Surgeon: Deetta Perla, MD;  Location: [facility] ORS;  Service: Neurosurgery;  Laterality: N/A;  . TUBAL LIGATION      Prior to Admission medications   Medication Sig Start Date End Date Taking? Authorizing Provider  acetaminophen (TYLENOL) 650 MG CR tablet Take 650 mg by mouth daily.    Historical Provider, MD  esomeprazole (NEXIUM) 40 MG capsule Take 40 mg  by mouth daily at 12 noon.    Historical Provider, MD  gabapentin (NEURONTIN) 300 MG capsule Take 300 mg by mouth daily.    Historical Provider, MD  levalbuterol Penne Lash) 1.25 MG/3ML nebulizer solution Inhale 1.25 mg into the lungs every 4 (four) hours as needed for wheezing or shortness of breath.  08/16/16 08/16/17  Historical Provider, MD  meloxicam (MOBIC) 15 MG tablet Take 1 tablet (15 mg total) by mouth daily. Patient not taking: Reported on 10/04/2016 08/22/15   Charline Bills Cuthriell, PA-C  metFORMIN (GLUCOPHAGE) 500 MG tablet Take 500 mg by mouth 2 (two) times daily with a meal.    Historical Provider, MD  methocarbamol (ROBAXIN) 500 MG tablet Take 1 tablet (500 mg total) by mouth every 8 (eight) hours as needed for muscle spasms. 10/15/16   Deetta Perla, MD  mometasone-formoterol [facility]) 100-5 MCG/ACT AERO Inhale 2 puffs into the lungs 2 (two) times daily.    Historical Provider, MD  nystatin cream (MYCOSTATIN) Apply 1 application topically 2 (two) times daily. Patient taking differently: Apply 1 application topically daily as needed for dry skin (rash).  07/31/15   Victorino Dike, FNP  oxyCODONE (OXY IR/ROXICODONE) 5 MG immediate release tablet Take 1-2 tablets (5-10 mg total) by mouth every 4 (four) hours as needed for moderate pain (5 mg for 4-6/10 pain, 10 mg for 7-8/10 pain). 10/15/16  Deetta Perla, MD  predniSONE (DELTASONE) 10 MG tablet Take 10 mg by mouth daily.    Historical Provider, MD  senna (SENOKOT) 8.6 MG TABS tablet Take 1 tablet (8.6 mg total) by mouth 2 (two) times daily. 10/16/16   Deetta Perla, MD  simvastatin (ZOCOR) 20 MG tablet Take 20 mg by mouth daily.    Historical Provider, MD  triamterene-hydrochlorothiazide (DYAZIDE) 37.5-25 MG capsule Take 1 capsule by mouth daily.    Historical Provider, MD    Allergies Codeine; Furosemide; Penicillin v potassium; Quinine; and Tramadol  Family History  Problem Relation Age of Onset  . Breast cancer Sister 72  . Breast cancer  Cousin     maternal side 52's    Social History Social History  Substance Use Topics  . Smoking status: Former Research scientist (life sciences)  . Smokeless tobacco: Never Used  . Alcohol use No    Review of Systems Constitutional: No fever/chills Eyes: No visual changes. ENT: No sore throat. Cardiovascular: right sided chest pain. Respiratory: Denies shortness of breath. Gastrointestinal: No abdominal pain.  No nausea, no vomiting.  No diarrhea.  No constipation. Genitourinary: Negative for dysuria. Musculoskeletal: pain in lower back s/p surgery < 2 full days ago Skin: Negative for rash. Neurological: Negative for headaches, focal weakness or numbness.  10-point ROS otherwise negative.  ____________________________________________   PHYSICAL EXAM:  VITAL SIGNS: ED Triage Vitals  Enc Vitals Group     BP 10/17/16 0110 (!) 116/59     Pulse Rate 10/17/16 0110 74     Resp 10/17/16 0110 20     Temp 10/17/16 0110 98 F (36.7 C)     Temp Source 10/17/16 0110 Oral     SpO2 10/17/16 0110 100 %     Weight 10/17/16 0110 217 lb (98.4 kg)     Height 10/17/16 0110 4\' 11"  (1.499 m)     Head Circumference --      Peak Flow --      Pain Score 10/17/16 0111 10     Pain Loc --      Pain Edu? --      Excl. in [place]? --     Constitutional: Alert and oriented. Well appearing and in no acute distress. Eyes: Conjunctivae are normal. PERRL. EOMI. Head: Atraumatic. Nose: No congestion/rhinnorhea. Mouth/Throat: Mucous membranes are moist.  Oropharynx non-erythematous. Neck: No stridor.  No meningeal signs.   Cardiovascular: Normal rate, regular rhythm. Good peripheral circulation. Grossly normal heart sounds.  Highly reproducible right sided chest wall tenderness including the right breast and the chest wall below the right breast\ Respiratory: Normal respiratory effort.  No retractions. Lungs CTAB. Gastrointestinal: Soft and nontender. No distention.  Musculoskeletal: No lower extremity tenderness nor edema.  No gross deformities of extremities. Neurologic:  Normal speech and language. No gross focal neurologic deficits are appreciated.  Skin:  Skin is warm, dry and intact. No rash noted.  Dressings on back have minimal serosanginous drainage. Psychiatric: Mood and affect are normal. Speech and behavior are normal.  ____________________________________________   LABS (all labs ordered are listed, but only abnormal results are displayed)  Labs Reviewed  BASIC METABOLIC PANEL - Abnormal; Notable for the following:       Result Value   Glucose, Bld 181 (*)    BUN 41 (*)    Creatinine, Ser 1.54 (*)    Calcium 8.5 (*)    GFR calc non Af Amer 35 (*)    GFR calc Af Amer 40 (*)    All  other components within normal limits  CBC - Abnormal; Notable for the following:    WBC 19.2 (*)    Hemoglobin 11.4 (*)    HCT 34.9 (*)    RDW 15.0 (*)    All other components within normal limits  TROPONIN I   ____________________________________________  EKG  ED ECG REPORT I, Andera Cranmer, the attending physician, personally viewed and interpreted this ECG.  Date: 10/17/2016 EKG Time: 1:14 AM Rate: 76 Rhythm: normal sinus rhythm QRS Axis: normal Intervals: normal ST/T Wave abnormalities: normal Conduction Disturbances: none Narrative Interpretation: unremarkable  ____________________________________________  RADIOLOGY   Dg Chest 2 View  Result Date: 10/17/2016 CLINICAL DATA:  Right-sided chest pain today. Previous lumbar surgery on 10/15/2016. Hurts with deep breathing. History of asthma, hypertension, diabetes. EXAM: CHEST  2 VIEW COMPARISON:  10/09/2016 FINDINGS: Normal heart size and pulmonary vascularity. Mild hyperinflation. No focal airspace disease or consolidation in the lungs. No blunting of costophrenic angles. No pneumothorax. Mediastinal contours appear intact. Calcified and tortuous aorta. Degenerative changes in the spine and shoulders. IMPRESSION: No active cardiopulmonary  disease. Electronically Signed   By: Lucienne Capers M.D.   On: 10/17/2016 01:47    ____________________________________________   PROCEDURES  Procedure(s) performed:   Procedures   Critical Care performed: No ____________________________________________   INITIAL IMPRESSION / ASSESSMENT AND PLAN / ED COURSE  Pertinent labs & imaging results that were available during my care of the patient were reviewed by me and considered in my medical decision making (see chart for details).  Labs, CXR, and EKG all reassuring, very slightly elevated creatinine, but the patient is tolerating PO intake without difficulty and I encouraged her to drink more fluids at home.  Highly reproducible chest wall pain most consistent with MSK strain/injury.  I verified with the patient that her surgery was performed in the prone position.  I explained my suspicion that the position of the surgery was what has made her sore.  She and her son seem to understand this and be comfortable with the plan for outpatient follow up .  She very much wants "a shot" of medicine to help her feel better, though she does state that she has Percocet at home from Dr. Lacinda Axon.  I considered Toradol, but given the slight elevation of her creatinine, I decided on morphine 4 mg IM instead.  She was in no acute distress upon departure and will follow up with her neurosurgeon.   ____________________________________________  FINAL CLINICAL IMPRESSION(S) / ED DIAGNOSES  Final diagnoses:  Right-sided chest wall pain     MEDICATIONS GIVEN DURING THIS VISIT:  Medications  morphine 4 MG/ML injection 4 mg (4 mg Intramuscular Given 10/17/16 0636)     NEW OUTPATIENT MEDICATIONS STARTED DURING THIS VISIT:  Discharge Medication List as of 10/17/2016  6:50 AM      Discharge Medication List as of 10/17/2016  6:50 AM      Discharge Medication List as of 10/17/2016  6:50 AM       Note:  This document was prepared using Dragon voice  recognition software and may include unintentional dictation errors.    Hinda Kehr, MD 10/17/16 [phone]

## 2016-10-17 NOTE — Discharge Instructions (Signed)
You have been seen in the Emergency Department (ED) today for chest pain.  As we have discussed today?s test results are normal, and we believe your pain is due to pain/strain and/or inflammation of the muscles and/or cartilage of your chest wall.   You may take the medications prescribed to you by Dr. Lacinda Axon.  Be sure to take deep breaths and drink plenty of fluids since you just had surgery.  Read through the included information for additional treatment recommendations and precautions.  Continue to take your regular medications.   Return to the Emergency Department (ED) if you experience any further chest pain/pressure/tightness, difficulty breathing, or sudden sweating, or other symptoms that concern you.

## 2016-10-17 NOTE — ED Notes (Signed)
Discharge instructions reviewed with patient. Questions fielded by this RN. Patient verbalizes understanding of instructions. Patient discharged home in stable condition per [place] MD . No acute distress noted at time of discharge.   

## 2016-11-05 ENCOUNTER — Inpatient Hospital Stay: Payer: Medicare Other | Admitting: Certified Registered Nurse Anesthetist

## 2016-11-05 ENCOUNTER — Encounter
Admission: AD | Disposition: A | Discharge status: Home / Self Care | Payer: Self-pay | Source: Ambulatory Visit | Attending: Neurosurgery

## 2016-11-05 ENCOUNTER — Inpatient Hospital Stay
Admission: AD | Admit: 2016-11-05 | Discharge: 2016-11-08 | DRG: 903 | Disposition: A | Discharge status: Home / Self Care | Payer: Medicare Other | Source: Ambulatory Visit | Attending: Neurosurgery | Admitting: Neurosurgery

## 2016-11-05 DIAGNOSIS — G4730 Sleep apnea, unspecified: Secondary | ICD-10-CM | POA: Diagnosis present

## 2016-11-05 DIAGNOSIS — Z87891 Personal history of nicotine dependence: Secondary | ICD-10-CM | POA: Diagnosis not present

## 2016-11-05 DIAGNOSIS — G609 Hereditary and idiopathic neuropathy, unspecified: Secondary | ICD-10-CM | POA: Diagnosis present

## 2016-11-05 DIAGNOSIS — Z880 Allergy status to penicillin: Secondary | ICD-10-CM | POA: Diagnosis not present

## 2016-11-05 DIAGNOSIS — E119 Type 2 diabetes mellitus without complications: Secondary | ICD-10-CM | POA: Diagnosis present

## 2016-11-05 DIAGNOSIS — M810 Age-related osteoporosis without current pathological fracture: Secondary | ICD-10-CM | POA: Diagnosis present

## 2016-11-05 DIAGNOSIS — Z86010 Personal history of colonic polyps: Secondary | ICD-10-CM

## 2016-11-05 DIAGNOSIS — K219 Gastro-esophageal reflux disease without esophagitis: Secondary | ICD-10-CM | POA: Diagnosis present

## 2016-11-05 DIAGNOSIS — Z886 Allergy status to analgesic agent status: Secondary | ICD-10-CM

## 2016-11-05 DIAGNOSIS — R262 Difficulty in walking, not elsewhere classified: Secondary | ICD-10-CM

## 2016-11-05 DIAGNOSIS — Z8673 Personal history of transient ischemic attack (TIA), and cerebral infarction without residual deficits: Secondary | ICD-10-CM

## 2016-11-05 DIAGNOSIS — Z8249 Family history of ischemic heart disease and other diseases of the circulatory system: Secondary | ICD-10-CM | POA: Diagnosis not present

## 2016-11-05 DIAGNOSIS — Z803 Family history of malignant neoplasm of breast: Secondary | ICD-10-CM

## 2016-11-05 DIAGNOSIS — T8130XA Disruption of wound, unspecified, initial encounter: Secondary | ICD-10-CM | POA: Diagnosis present

## 2016-11-05 DIAGNOSIS — Z6841 Body Mass Index (BMI) 40.0 and over, adult: Secondary | ICD-10-CM | POA: Diagnosis not present

## 2016-11-05 DIAGNOSIS — Z7984 Long term (current) use of oral hypoglycemic drugs: Secondary | ICD-10-CM

## 2016-11-05 DIAGNOSIS — Z79899 Other long term (current) drug therapy: Secondary | ICD-10-CM | POA: Diagnosis not present

## 2016-11-05 DIAGNOSIS — Z90710 Acquired absence of both cervix and uterus: Secondary | ICD-10-CM

## 2016-11-05 DIAGNOSIS — Z888 Allergy status to other drugs, medicaments and biological substances status: Secondary | ICD-10-CM

## 2016-11-05 DIAGNOSIS — Z9889 Other specified postprocedural states: Secondary | ICD-10-CM

## 2016-11-05 DIAGNOSIS — Z823 Family history of stroke: Secondary | ICD-10-CM

## 2016-11-05 DIAGNOSIS — I10 Essential (primary) hypertension: Secondary | ICD-10-CM | POA: Diagnosis present

## 2016-11-05 HISTORY — PX: LUMBAR WOUND DEBRIDEMENT: SHX1988

## 2016-11-05 LAB — GLUCOSE, CAPILLARY
GLUCOSE-CAPILLARY: 91 mg/dL (ref 65–99)
GLUCOSE-CAPILLARY: 198 mg/dL — AB (ref 65–99)
GLUCOSE-CAPILLARY: 179 mg/dL — AB (ref 65–99)

## 2016-11-05 LAB — MRSA PCR SCREENING: MRSA BY PCR: NEGATIVE

## 2016-11-05 SURGERY — LUMBAR WOUND DEBRIDEMENT
Anesthesia: General | Site: Spine Lumbar | Wound class: Dirty or Infected

## 2016-11-05 MED ORDER — FENTANYL CITRATE (PF) 100 MCG/2ML IJ SOLN
INTRAMUSCULAR | Status: DC | PRN
  Administered 2016-11-05: 25 ug via INTRAVENOUS
  Administered 2016-11-05: 100 ug via INTRAVENOUS
  Administered 2016-11-05: 50 ug via INTRAVENOUS
  Administered 2016-11-05: 25 ug via INTRAVENOUS

## 2016-11-05 MED ORDER — LACTATED RINGERS IV SOLN: INTRAVENOUS | Status: DC | PRN

## 2016-11-05 MED ORDER — GABAPENTIN 300 MG PO CAPS
300.0000 mg | ORAL_CAPSULE | Freq: Three times a day (TID) | ORAL | Status: DC
  Administered 2016-11-05 – 2016-11-08 (×9): 300 mg via ORAL
  Filled 2016-11-05 (×9): qty 1

## 2016-11-05 MED ORDER — ACETAMINOPHEN 650 MG RE SUPP: 650.0000 mg | Freq: Four times a day (QID) | RECTAL | Status: DC | PRN

## 2016-11-05 MED ORDER — ONDANSETRON HCL 4 MG/2ML IJ SOLN
INTRAMUSCULAR | Status: DC | PRN
  Administered 2016-11-05: 4 mg via INTRAVENOUS

## 2016-11-05 MED ORDER — TRIAMTERENE-HCTZ 37.5-25 MG PO TABS
1.0000 | ORAL_TABLET | Freq: Every day | ORAL | Status: DC
  Administered 2016-11-06 – 2016-11-08 (×3): 1 via ORAL
  Filled 2016-11-05 (×3): qty 1

## 2016-11-05 MED ORDER — LIDOCAINE HCL (PF) 2 % IJ SOLN
INTRAMUSCULAR | Status: AC
  Filled 2016-11-05: qty 2

## 2016-11-05 MED ORDER — SUGAMMADEX SODIUM 200 MG/2ML IV SOLN
INTRAVENOUS | Status: DC | PRN
  Administered 2016-11-05: 200 mg via INTRAVENOUS

## 2016-11-05 MED ORDER — ACETAMINOPHEN 325 MG PO TABS
650.0000 mg | ORAL_TABLET | Freq: Four times a day (QID) | ORAL | Status: DC | PRN
  Administered 2016-11-05: 650 mg via ORAL
  Filled 2016-11-05 (×2): qty 2

## 2016-11-05 MED ORDER — POTASSIUM CHLORIDE IN NACL 20-0.9 MEQ/L-% IV SOLN
INTRAVENOUS | Status: DC
  Administered 2016-11-05 – 2016-11-06 (×2): via INTRAVENOUS
  Filled 2016-11-05 (×4): qty 1000

## 2016-11-05 MED ORDER — MOMETASONE FURO-FORMOTEROL FUM 100-5 MCG/ACT IN AERO
2.0000 | INHALATION_SPRAY | Freq: Two times a day (BID) | RESPIRATORY_TRACT | Status: DC
  Administered 2016-11-05 – 2016-11-08 (×6): 2 via RESPIRATORY_TRACT
  Filled 2016-11-05: qty 8.8

## 2016-11-05 MED ORDER — DEXAMETHASONE SODIUM PHOSPHATE 10 MG/ML IJ SOLN
INTRAMUSCULAR | Status: AC
  Filled 2016-11-05: qty 1

## 2016-11-05 MED ORDER — METHOCARBAMOL 500 MG PO TABS
500.0000 mg | ORAL_TABLET | Freq: Four times a day (QID) | ORAL | Status: DC | PRN
  Administered 2016-11-05 – 2016-11-08 (×6): 500 mg via ORAL
  Filled 2016-11-05 (×6): qty 1

## 2016-11-05 MED ORDER — ROCURONIUM BROMIDE 100 MG/10ML IV SOLN
INTRAVENOUS | Status: DC | PRN
  Administered 2016-11-05: 50 mg via INTRAVENOUS

## 2016-11-05 MED ORDER — SUGAMMADEX SODIUM 200 MG/2ML IV SOLN
INTRAVENOUS | Status: AC
  Filled 2016-11-05: qty 2

## 2016-11-05 MED ORDER — PROPOFOL 10 MG/ML IV BOLUS
INTRAVENOUS | Status: DC | PRN
  Administered 2016-11-05: 180 mg via INTRAVENOUS

## 2016-11-05 MED ORDER — ONDANSETRON HCL 4 MG/2ML IJ SOLN
4.0000 mg | Freq: Four times a day (QID) | INTRAMUSCULAR | Status: DC | PRN
  Administered 2016-11-05 – 2016-11-06 (×2): 4 mg via INTRAVENOUS
  Filled 2016-11-05 (×2): qty 2

## 2016-11-05 MED ORDER — BUPIVACAINE-EPINEPHRINE (PF) 0.5% -1:200000 IJ SOLN
INTRAMUSCULAR | Status: DC | PRN
  Administered 2016-11-05: 20 mL

## 2016-11-05 MED ORDER — VANCOMYCIN HCL IN DEXTROSE 1-5 GM/200ML-% IV SOLN: 1000.0000 mg | Freq: Two times a day (BID) | INTRAVENOUS | Status: DC

## 2016-11-05 MED ORDER — MEPERIDINE HCL 25 MG/ML IJ SOLN: 6.2500 mg | INTRAMUSCULAR | Status: DC | PRN

## 2016-11-05 MED ORDER — SIMVASTATIN 20 MG PO TABS
20.0000 mg | ORAL_TABLET | Freq: Every day | ORAL | Status: DC
  Administered 2016-11-05 – 2016-11-07 (×3): 20 mg via ORAL
  Filled 2016-11-05 (×3): qty 1

## 2016-11-05 MED ORDER — PANTOPRAZOLE SODIUM 40 MG PO TBEC
40.0000 mg | DELAYED_RELEASE_TABLET | Freq: Every day | ORAL | Status: DC
  Administered 2016-11-06 – 2016-11-08 (×3): 40 mg via ORAL
  Filled 2016-11-05 (×3): qty 1

## 2016-11-05 MED ORDER — FENTANYL CITRATE (PF) 100 MCG/2ML IJ SOLN
INTRAMUSCULAR | Status: AC
  Filled 2016-11-05: qty 2

## 2016-11-05 MED ORDER — LIDOCAINE HCL (CARDIAC) 20 MG/ML IV SOLN
INTRAVENOUS | Status: DC | PRN
  Administered 2016-11-05: 100 mg via INTRAVENOUS

## 2016-11-05 MED ORDER — FENTANYL CITRATE (PF) 100 MCG/2ML IJ SOLN
25.0000 ug | INTRAMUSCULAR | Status: DC | PRN
  Administered 2016-11-05 (×2): 50 ug via INTRAVENOUS

## 2016-11-05 MED ORDER — METHOCARBAMOL 1000 MG/10ML IJ SOLN
500.0000 mg | Freq: Four times a day (QID) | INTRAVENOUS | Status: DC | PRN
  Filled 2016-11-05: qty 5

## 2016-11-05 MED ORDER — FAMOTIDINE IN NACL 20-0.9 MG/50ML-% IV SOLN
20.0000 mg | Freq: Two times a day (BID) | INTRAVENOUS | Status: DC
  Filled 2016-11-05 (×3): qty 50

## 2016-11-05 MED ORDER — GLYCOPYRROLATE 0.2 MG/ML IJ SOLN
INTRAMUSCULAR | Status: AC
  Filled 2016-11-05: qty 1

## 2016-11-05 MED ORDER — ROCURONIUM BROMIDE 50 MG/5ML IV SOSY
PREFILLED_SYRINGE | INTRAVENOUS | Status: AC
  Filled 2016-11-05: qty 5

## 2016-11-05 MED ORDER — PROMETHAZINE HCL 25 MG/ML IJ SOLN: 6.2500 mg | INTRAMUSCULAR | Status: DC | PRN

## 2016-11-05 MED ORDER — ACETAMINOPHEN 500 MG PO TABS
1000.0000 mg | ORAL_TABLET | Freq: Three times a day (TID) | ORAL | Status: DC
  Administered 2016-11-06 – 2016-11-08 (×7): 1000 mg via ORAL
  Filled 2016-11-05 (×7): qty 2

## 2016-11-05 MED ORDER — SODIUM CHLORIDE 0.9 % IV SOLN
INTRAVENOUS | Status: DC | PRN
  Administered 2016-11-05: 16:00:00 via INTRAVENOUS

## 2016-11-05 MED ORDER — VANCOMYCIN HCL IN DEXTROSE 1-5 GM/200ML-% IV SOLN
1000.0000 mg | INTRAVENOUS | Status: DC
  Administered 2016-11-06: 1000 mg via INTRAVENOUS
  Filled 2016-11-05 (×2): qty 200

## 2016-11-05 MED ORDER — ENOXAPARIN SODIUM 40 MG/0.4ML ~~LOC~~ SOLN
40.0000 mg | SUBCUTANEOUS | Status: DC
  Administered 2016-11-06 – 2016-11-07 (×2): 40 mg via SUBCUTANEOUS
  Filled 2016-11-05 (×2): qty 0.4

## 2016-11-05 MED ORDER — ONDANSETRON HCL 4 MG/2ML IJ SOLN
INTRAMUSCULAR | Status: AC
  Administered 2016-11-05: 4 mg
  Filled 2016-11-05: qty 2

## 2016-11-05 MED ORDER — ONDANSETRON HCL 4 MG PO TABS: 4.0000 mg | ORAL_TABLET | Freq: Four times a day (QID) | ORAL | Status: DC | PRN

## 2016-11-05 MED ORDER — DEXAMETHASONE SODIUM PHOSPHATE 10 MG/ML IJ SOLN
INTRAMUSCULAR | Status: DC | PRN
Start: 2016-11-05 — End: 2016-11-05
  Administered 2016-11-05: 10 mg via INTRAVENOUS

## 2016-11-05 MED ORDER — ACETAMINOPHEN 500 MG PO TABS: 1000.0000 mg | ORAL_TABLET | Freq: Four times a day (QID) | ORAL | Status: DC

## 2016-11-05 MED ORDER — ALBUTEROL SULFATE (2.5 MG/3ML) 0.083% IN NEBU: 3.0000 mL | INHALATION_SOLUTION | Freq: Four times a day (QID) | RESPIRATORY_TRACT | Status: DC | PRN

## 2016-11-05 MED ORDER — OXYCODONE HCL 5 MG PO TABS: 5.0000 mg | ORAL_TABLET | Freq: Once | ORAL | Status: DC | PRN

## 2016-11-05 MED ORDER — MIDAZOLAM HCL 2 MG/2ML IJ SOLN
INTRAMUSCULAR | Status: DC | PRN
  Administered 2016-11-05: 2 mg via INTRAVENOUS

## 2016-11-05 MED ORDER — METFORMIN HCL 500 MG PO TABS
500.0000 mg | ORAL_TABLET | Freq: Two times a day (BID) | ORAL | Status: DC
  Administered 2016-11-06 – 2016-11-08 (×6): 500 mg via ORAL
  Filled 2016-11-05 (×6): qty 1

## 2016-11-05 MED ORDER — PREDNISONE 10 MG PO TABS
10.0000 mg | ORAL_TABLET | Freq: Every day | ORAL | Status: DC
  Administered 2016-11-06 – 2016-11-08 (×3): 10 mg via ORAL
  Filled 2016-11-05 (×3): qty 1

## 2016-11-05 MED ORDER — PROPOFOL 10 MG/ML IV BOLUS
INTRAVENOUS | Status: AC
  Filled 2016-11-05: qty 20

## 2016-11-05 MED ORDER — ONDANSETRON HCL 4 MG/2ML IJ SOLN
INTRAMUSCULAR | Status: AC
  Filled 2016-11-05: qty 2

## 2016-11-05 MED ORDER — DOCUSATE SODIUM 100 MG PO CAPS
100.0000 mg | ORAL_CAPSULE | Freq: Two times a day (BID) | ORAL | Status: DC
  Administered 2016-11-05 – 2016-11-08 (×6): 100 mg via ORAL
  Filled 2016-11-05 (×6): qty 1

## 2016-11-05 MED ORDER — SODIUM CHLORIDE 0.9 % IR SOLN
Status: DC | PRN
  Administered 2016-11-05: 1000 mL

## 2016-11-05 MED ORDER — VANCOMYCIN HCL 1000 MG IV SOLR
INTRAVENOUS | Status: DC | PRN
  Administered 2016-11-05: 1000 mg via INTRAVENOUS

## 2016-11-05 MED ORDER — OXYCODONE HCL 5 MG PO TABS
5.0000 mg | ORAL_TABLET | ORAL | Status: DC | PRN
  Administered 2016-11-05 – 2016-11-08 (×8): 10 mg via ORAL
  Filled 2016-11-05 (×8): qty 2

## 2016-11-05 MED ORDER — VANCOMYCIN HCL IN DEXTROSE 1-5 GM/200ML-% IV SOLN
INTRAVENOUS | Status: AC
Start: 2016-11-05 — End: 2016-11-06
  Filled 2016-11-05: qty 200

## 2016-11-05 MED ORDER — MIDAZOLAM HCL 2 MG/2ML IJ SOLN
INTRAMUSCULAR | Status: AC
  Filled 2016-11-05: qty 2

## 2016-11-05 MED ORDER — INSULIN ASPART 100 UNIT/ML ~~LOC~~ SOLN
0.0000 [IU] | Freq: Three times a day (TID) | SUBCUTANEOUS | Status: DC
  Administered 2016-11-06 (×2): 3 [IU] via SUBCUTANEOUS
  Filled 2016-11-05 (×2): qty 3

## 2016-11-05 MED ORDER — OXYCODONE HCL 5 MG/5ML PO SOLN: 5.0000 mg | Freq: Once | ORAL | Status: DC | PRN

## 2016-11-05 SURGICAL SUPPLY — 71 items
AGENT HMST MTR 8 SURGIFLO (HEMOSTASIS)
BAND RUBBER 3X1/6 STRL (MISCELLANEOUS) IMPLANT
BAND RUBBER 3X1/6 TAN STRL (MISCELLANEOUS) IMPLANT
BLADE BOVIE TIP EXT 4 (BLADE) ×3 IMPLANT
BUR NEURO DRILL SOFT 3.0X3.8M (BURR) IMPLANT
CANISTER SUCT 1200ML W/VALVE (MISCELLANEOUS) ×6 IMPLANT
CHLORAPREP W/TINT 26ML (MISCELLANEOUS) ×6 IMPLANT
COUNTER NEEDLE 20/40 LG (NEEDLE) ×3 IMPLANT
COVER LIGHT HANDLE STERIS (MISCELLANEOUS) ×6 IMPLANT
CRADLE LAMINECT ARM (MISCELLANEOUS) ×3 IMPLANT
CUP MEDICINE 2OZ PLAST GRAD ST (MISCELLANEOUS) ×3 IMPLANT
DEVICE DISSECT PLASMABLAD 3.0S (MISCELLANEOUS) ×2 IMPLANT
DRAPE LAPAROTOMY 100X77 ABD (DRAPES) ×3 IMPLANT
DRAPE MICROSCOPE LEICA (MISCELLANEOUS) IMPLANT
DRAPE POUCH INSTRU U-SHP 10X18 (DRAPES) ×3 IMPLANT
DRAPE SURG 17X11 SM STRL (DRAPES) ×3 IMPLANT
DRESSING TELFA 4X3 1S ST N-ADH (GAUZE/BANDAGES/DRESSINGS) IMPLANT
DRSG TEGADERM 4X4.75 (GAUZE/BANDAGES/DRESSINGS) ×2 IMPLANT
ELECT CAUTERY BLADE TIP 2.5 (TIP) ×3
ELECT EZSTD 165MM 6.5IN (MISCELLANEOUS) ×3
ELECT REM PT RETURN 9FT ADLT (ELECTROSURGICAL) ×3
ELECTRODE CAUTERY BLDE TIP 2.5 (TIP) ×2 IMPLANT
ELECTRODE EZSTD 165MM 6.5IN (MISCELLANEOUS) ×2 IMPLANT
ELECTRODE REM PT RTRN 9FT ADLT (ELECTROSURGICAL) ×2 IMPLANT
GAUZE SPONGE 4X4 12PLY STRL (GAUZE/BANDAGES/DRESSINGS) ×2 IMPLANT
GAUZE XEROFORM 4X4 STRL (GAUZE/BANDAGES/DRESSINGS) ×2 IMPLANT
GLOVE BIOGEL PI IND STRL 8 (GLOVE) ×2 IMPLANT
GLOVE BIOGEL PI INDICATOR 8 (GLOVE) ×1
GLOVE SURG SYN 8.0 (GLOVE) ×6 IMPLANT
GLOVE SURG SYN 8.0 PF PI (GLOVE) ×2 IMPLANT
GOWN STRL REUS W/ TWL LRG LVL3 (GOWN DISPOSABLE) ×2 IMPLANT
GOWN STRL REUS W/ TWL XL LVL3 (GOWN DISPOSABLE) ×2 IMPLANT
GOWN STRL REUS W/TWL LRG LVL3 (GOWN DISPOSABLE) ×3
GOWN STRL REUS W/TWL XL LVL3 (GOWN DISPOSABLE) ×3
GRADUATE 1200CC STRL 31836 (MISCELLANEOUS) ×3 IMPLANT
HANDPIECE INTERPULSE COAX TIP (DISPOSABLE) ×3
HEMOVAC 400CC 10FR (MISCELLANEOUS) ×2 IMPLANT
HEMOVAC 400ML (MISCELLANEOUS) ×3
KIT DRAIN HEMOVAC JP 7FR 400ML (MISCELLANEOUS) ×2 IMPLANT
KIT RM TURNOVER STRD PROC AR (KITS) ×3 IMPLANT
KIT WILSON FRAME (KITS) ×3 IMPLANT
MARKER SKIN DUAL TIP RULER LAB (MISCELLANEOUS) ×6 IMPLANT
NDL SAFETY ECLIPSE 18X1.5 (NEEDLE) ×2 IMPLANT
NEEDLE HYPO 18GX1.5 SHARP (NEEDLE) ×3
NEEDLE HYPO 22GX1.5 SAFETY (NEEDLE) ×3 IMPLANT
NS IRRIG 1000ML POUR BTL (IV SOLUTION) ×3 IMPLANT
PACK LAMINECTOMY NEURO (CUSTOM PROCEDURE TRAY) ×3 IMPLANT
PAD ARMBOARD 7.5X6 YLW CONV (MISCELLANEOUS) ×3 IMPLANT
PLASMABLADE 3.0S (MISCELLANEOUS) ×3
SET HNDPC FAN SPRY TIP SCT (DISPOSABLE) ×2 IMPLANT
SOL .9 NS 3000ML IRR  AL (IV SOLUTION) ×1
SOL .9 NS 3000ML IRR AL (IV SOLUTION) ×2
SOL .9 NS 3000ML IRR UROMATIC (IV SOLUTION) ×2 IMPLANT
SPOGE SURGIFLO 8M (HEMOSTASIS)
SPONGE SURGIFLO 8M (HEMOSTASIS) IMPLANT
STAPLER SKIN PROX 35W (STAPLE) IMPLANT
STRIP CLOSURE SKIN 1/2X4 (GAUZE/BANDAGES/DRESSINGS) IMPLANT
SUT ETHILON 3-0 FS-10 30 BLK (SUTURE) ×9
SUT NURALON 4 0 TR CR/8 (SUTURE) IMPLANT
SUT PDS 2-0 27IN (SUTURE) ×10 IMPLANT
SUT PDS II 3-0 (SUTURE) ×8 IMPLANT
SUT PDSII 8 18 CT-1 CR8 (SUTURE) ×2 IMPLANT
SUT VIC AB 0 CT1 18XCR BRD 8 (SUTURE) IMPLANT
SUT VIC AB 0 CT1 8-18 (SUTURE)
SUT VIC AB 3-0 SH 8-18 (SUTURE) IMPLANT
SUTURE EHLN 3-0 FS-10 30 BLK (SUTURE) ×3 IMPLANT
SYR 20CC LL (SYRINGE) ×3 IMPLANT
SYR 30ML LL (SYRINGE) ×6 IMPLANT
SYRINGE 10CC LL (SYRINGE) ×6 IMPLANT
TOWEL OR 17X26 4PK STRL BLUE (TOWEL DISPOSABLE) ×6 IMPLANT
TUBING CONNECTING 10 (TUBING) ×6 IMPLANT

## 2016-11-05 NOTE — Progress Notes (Signed)
Patient verbalized how to tie tourniquet and  which arm she want IV. V access attempted once without success.   Patient refused for nurse or anyone else to attempt.  Nursing supervisor called and asked to attempt IV.

## 2016-11-05 NOTE — Anesthesia Postprocedure Evaluation (Signed)
Anesthesia Post Note  Patient: [name] [name]  Procedure(s) Performed: Procedure(s) (LRB): LUMBAR WOUND DEBRIDEMENT (N/A)  Patient location during evaluation: PACU Anesthesia Type: General Level of consciousness: awake and alert and oriented Pain management: pain level controlled Vital Signs Assessment: post-procedure vital signs reviewed and stable Respiratory status: spontaneous breathing, nonlabored ventilation and respiratory function stable Cardiovascular status: blood pressure returned to baseline and stable Postop Assessment: no signs of nausea or vomiting Anesthetic complications: no     Last Vitals:  Vitals:   11/05/16 1830 11/05/16 1853  BP:  (!) 151/94  Pulse: 80 84  Resp: 18 19  Temp:  36.2 C    Last Pain:  Vitals:   11/05/16 1853  TempSrc:   PainSc: Asleep                 Gaylen Pereira

## 2016-11-05 NOTE — Anesthesia Post-op Follow-up Note (Cosign Needed)
Anesthesia QCDR form completed.        

## 2016-11-05 NOTE — Progress Notes (Signed)
Pharmacy Antibiotic Note  [name] [name] is a 66 y.o. female admitted on 11/05/2016 with surgical prophylaxis.  Pharmacy has been consulted for vancomycin dosing.  Plan: Vancomycin 1 gm IV Q24H. First dose was given this afternoon at 16:44. Continue maintenance doses in approximately 12 hours (stacked dosing) as vancomycin 1 gm IV Q24H. Predicted trough 16 mcg/mL. Pharmacy will continue to follow and adjust as needed to maintain trough 15 to 20 mcg/mL.   Vd 45.5 L, Ke 0.036 hr-1, T1/2 19.1 hr  Height: 4\' 11"  (149.9 cm) Weight: 217 lb (98.4 kg) IBW/kg (Calculated) : 43.2  Temp (24hrs), Avg:97.4 F (36.3 C), Min:96.8 F (36 C), Max:97.8 F (36.6 C)  No results for input(s): WBC, CREATININE, LATICACIDVEN, VANCOTROUGH, VANCOPEAK, VANCORANDOM, GENTTROUGH, GENTPEAK, GENTRANDOM, TOBRATROUGH, TOBRAPEAK, TOBRARND, AMIKACINPEAK, AMIKACINTROU, AMIKACIN in the last 168 hours.  Estimated Creatinine Clearance: 38.5 mL/min (by C-G formula based on SCr of 1.54 mg/dL (H)).    Allergies  Allergen Reactions  . Codeine Nausea And Vomiting  . Furosemide Other (See Comments)  . Penicillin V Potassium     Other reaction(s): Unknown  . Quinine     Other reaction(s): Unknown  . Tramadol     Other reaction(s): Unknown     Thank you for allowing pharmacy to be a part of this patient's care.  Laural Benes, Pharm.D., BCPS Clinical Pharmacist 11/05/2016 9:46 PM

## 2016-11-05 NOTE — Progress Notes (Signed)
Pharmacist - Prescriber Communication  Per [place] protocol, the order for Xopenex HFA has been substituted to albuterol nebulizer solution at the same frequency.   Karita Dralle A. [place], [place].D., BCPS Clinical Pharmacist 11/05/2016 2135

## 2016-11-05 NOTE — Brief Op Note (Signed)
11/05/2016  5:50 PM  PATIENT:  [name] [name]  66 y.o. female  PRE-OPERATIVE DIAGNOSIS:  WOUND DEHISCENCE  POST-OPERATIVE DIAGNOSIS:  WOUND DEHISCENCE  PROCEDURE:  Procedure(s): LUMBAR WOUND DEBRIDEMENT (N/A)  SURGEON:  Surgeon(s) and Role:    * Deetta Perla, MD - Primary  PHYSICIAN ASSISTANT:   ASSISTANTS: none   ANESTHESIA:   general  EBL:  Total I/O In: 400 [I.V.:400] Out: 25 [Blood:25]  BLOOD ADMINISTERED:none  DRAINS: (suprafascial) Hemovact drain(s) with  Suction Open   LOCAL MEDICATIONS USED:  MARCAINE     SPECIMEN:  Source of Specimen:  Subcutaneous Tissue  DISPOSITION OF SPECIMEN:  Micro  COUNTS:  YES  TOURNIQUET:  * No tourniquets in log *  DICTATION: .Note written in EPIC  PLAN OF CARE: Admit for overnight observation  PATIENT DISPOSITION:  PACU - hemodynamically stable.   Delay start of Pharmacological VTE agent (>24hrs) due to surgical blood loss or risk of bleeding: yes

## 2016-11-05 NOTE — Transfer of Care (Signed)
Immediate Anesthesia Transfer of Care Note  Patient: [name] [name]  Procedure(s) Performed: Procedure(s): LUMBAR WOUND DEBRIDEMENT (N/A)  Patient Location: PACU  Anesthesia Type:General  Level of Consciousness: awake, alert , oriented and patient cooperative  Airway & Oxygen Therapy: Patient Spontanous Breathing and Patient connected to face mask oxygen  Post-op Assessment: Report given to RN, Post -op Vital signs reviewed and stable and Patient moving all extremities X 4  Post vital signs: Reviewed and stable  Last Vitals:  Vitals:   11/05/16 1234 11/05/16 1757  BP: (!) 152/93 (!) (P) 146/103  Pulse: 84   Resp: 16 (!) (P) 24  Temp: 36.4 C (!) (P) 36 C    Last Pain:  Vitals:   11/05/16 1234  TempSrc: Oral         Complications: No apparent anesthesia complications

## 2016-11-05 NOTE — Anesthesia Procedure Notes (Signed)
Procedure Name: Intubation Date/Time: 11/05/2016 4:19 PM Performed by: Doreen Salvage Pre-anesthesia Checklist: Patient identified, Patient being monitored, Timeout performed, Emergency Drugs available and Suction available Patient Re-evaluated:Patient Re-evaluated prior to inductionOxygen Delivery Method: Circle system utilized Preoxygenation: Pre-oxygenation with 100% oxygen Intubation Type: IV induction Ventilation: Mask ventilation without difficulty Laryngoscope Size: Mac and 3 Grade View: Grade I Tube type: Oral Tube size: 7.0 mm Number of attempts: 1 Airway Equipment and Method: Stylet Placement Confirmation: ETT inserted through vocal cords under direct vision,  positive ETCO2 and breath sounds checked- equal and bilateral Secured at: 21 cm Tube secured with: Tape Dental Injury: Teeth and Oropharynx as per pre-operative assessment

## 2016-11-05 NOTE — Op Note (Signed)
Neurosurgery Operative Note  PATIENT:  [name] [name]  66 y.o. female  PRE-OPERATIVE DIAGNOSIS:  WOUND DEHISCENCE  POST-OPERATIVE DIAGNOSIS:  WOUND DEHISCENCE  PROCEDURE:  Procedure(s): LUMBAR WOUND DEBRIDEMENT (N/A)  SURGEON:  Surgeon(s) and Role:    * Deetta Perla, MD - Primary  ANESTHESIA:   general  EBL:  Total I/O In: 400 [I.V.:400] Out: 25 [Blood:25]  DRAINS: (suprafascial) Hemovact drain(s) with  Suction Open   LOCAL MEDICATIONS USED:  MARCAINE     SPECIMEN:  Source of Specimen:  Subcutaneous Tissue to Micro   Indication: [name] [name] presented to the clinic on 1/29 with ongoing drainage and poor wound healing after a lumbar decompression earlier this month. She had been on antibiotics but given risk of infection with dehiscence, exploration and I&D was discussed. The risks of surgery were explained to include hematoma, infection, damage to nerve roots, CSF leak, weakness, numbness, pain, need for future surgery, heart attack, and stroke. She elected to proceed with surgery for wound healing   Procedure The patient was brought to the OR after informed consent was obtained. She was given general anesthesia and intubated by the anesthesia service. Vascular access lines were placed.The patient was then placed prone on a Wilson frame ensuring all pressure points were padded. A time-out was performed per protocol.   The patient was sterilely prepped and draped. The sutures were removed. The skin was opened sharply removing the granulation tissue at skin edges until a smooth plane of healthy tissue remained. This was taken deep to the fascia and all dead tissue removed. There was some fat necrosis seen and fluid deeper that was cultured. IV vancomycin was given after the culture sent. The fascia was well healed but was opened and the subfascial plane inspected. There was minimal tissue here and the dura appeared intact and decompressed.   Next, the pulse lavage was used  with antibiotic saline to irrigate the wound profusely. All cut sutures were removed. Hemostasis was obtained. The fascia was closed with 1 PDS suture until well approximated. The hemovac drain was tunneled from the suprafascial plane through the skin. The subcutaneous tissue was debrided and then closed in layers with 1 and 2 PDS sutures.  Marcaine was injected into the subcutaneous tissue. The skin was closed with 3-0 Nylon with vertical mattress technique. A dressing was applied. The drain was secured with Nylon.  The patient was returned to supine position and extubated by the anesthesia service. The patient was then taken to the PACU for post-operative care where he was moving extremities symmetrically. The results were discussed with her family   Deetta Perla, MD [phone]

## 2016-11-05 NOTE — Anesthesia Preprocedure Evaluation (Signed)
Anesthesia Evaluation  Patient identified by MRN, date of birth, ID band Patient awake    Reviewed: Allergy & Precautions, NPO status , Patient's Chart, lab work & pertinent test results  History of Anesthesia Complications Negative for: history of anesthetic complications  Airway Mallampati: II  TM Distance: >3 FB Neck ROM: Full    Dental  (+) Upper Dentures, Missing   Pulmonary asthma , former smoker,    breath sounds clear to auscultation- rhonchi (-) wheezing      Cardiovascular hypertension, Pt. on medications (-) CAD and (-) Past MI  Rhythm:Regular Rate:Normal - Systolic murmurs and - Diastolic murmurs    Neuro/Psych negative neurological ROS  negative psych ROS   GI/Hepatic Neg liver ROS, GERD  ,  Endo/Other  diabetes, Oral Hypoglycemic Agents  Renal/GU negative Renal ROS     Musculoskeletal negative musculoskeletal ROS (+)   Abdominal (+) + obese,   Peds  Hematology negative hematology ROS (+)   Anesthesia Other Findings Past Medical History: No date: Asthma No date: Diabetes mellitus without complication ([facility]) No date: GERD (gastroesophageal reflux disease) No date: Hyperlipidemia No date: Hypertension   Reproductive/Obstetrics                             Anesthesia Physical Anesthesia Plan  ASA: III  Anesthesia Plan: General   Post-op Pain Management:    Induction: Intravenous  Airway Management Planned: Oral ETT  Additional Equipment:   Intra-op Plan:   Post-operative Plan: Extubation in OR  Informed Consent: I have reviewed the patients History and Physical, chart, labs and discussed the procedure including the risks, benefits and alternatives for the proposed anesthesia with the patient or authorized representative who has indicated his/her understanding and acceptance.   Dental advisory given  Plan Discussed with: CRNA and Anesthesiologist  Anesthesia  Plan Comments:         Anesthesia Quick Evaluation

## 2016-11-05 NOTE — Progress Notes (Signed)
Patient taken to OR by orderly Beverely Low via bed.

## 2016-11-05 NOTE — Progress Notes (Signed)
Patient seen in PACU. Wound poorly healing with drainage. Low suspicion for infection but needs revision and exploration for definitive closure. Will send cultures in Or, plan for I&D. Patient explained procedure and she wishes to proceed.

## 2016-11-05 NOTE — H&P (Signed)
Referring Physician:  Self No address on file  Primary Physician:  Azzie Glatter, MD    Chief Complaint:  Wound drainage  History of Present Illness: [name] [name] is a 66 y.o. female who presents with the chief complaint of creasing wound drainage over the past few days.  She has not noted any foul-smelling use or fevers.  Which has been predominantly yellow and has been soaking bandages by the hour.  She has had increase the frequency of changing the bandages in particular over the past few days.  She continues to have numbness and pain greater left than right lower extremity.  She denies any bowel bladder incontinence.  She denies any chills or night sweats.  She denies any greenish drainage.  She states her wound has been slowly separating.  Nylon sutures remain in place.  She she has been washing her wound with peroxide solution.  She does have known diabetes as well.       Review of Systems:  A 10 point review of systems is negative, except for the pertinent positives and negatives detailed in the HPI.  Past Medical History:     Past Medical History:  Diagnosis Date  . Anemia, unspecified   . Chronic airway obstruction, not elsewhere classified , unspecified (CMS-HCC)   . Essential hypertension, benign   . Gastroesophageal reflux disease without esophagitis 12/14/2014  . History of adenomatous polyp of colon 09/08/2014  . History of bone density study 09/19/2007  . Hypersomnia with sleep apnea, unspecified   . Morbid obesity with BMI of 40.0-44.9, adult (CMS-HCC) 10/06/2014  . Obesity, unspecified   . Osteoporosis, post-menopausal   . Other and unspecified hyperlipidemia   . Plantar fascial fibromatosis   . Pulmonary fibrosis (CMS-HCC)   . TIA (transient ischemic attack)   . Type II or unspecified type diabetes mellitus without mention of complication, not stated as uncontrolled (CMS-HCC)    Non-insulin dependent.  PCP Dr. Ginette Pitman.  Marland Kitchen  Unspecified hereditary and idiopathic peripheral neuropathy     Past Surgical History:      Past Surgical History:  Procedure Laterality Date  . bladder suspension    . COLONOSCOPY  04/19/2008   Adenomatous Polyps: CBF 09/2011  . COLONOSCOPY  12/29/2014   Adenomatous Polyps: CBF 12/2017  . EGD  04/19/2008   No repeat per RTE  . EGD  12/29/2014   No repeat per RTE  . HYSTERECTOMY    . L3-5 Laminectomy  10/15/2016   Dr Deetta Perla  . ulcer surgery    . vein stripping      Allergies:      Allergies as of 11/05/2016 - Reviewed 11/05/2016  Allergen Reaction Noted  . Codeine Nausea and Dizziness 03/22/2014  . Lasix [furosemide] Unknown 03/17/2014  . Penicillin v potassium Unknown 03/17/2014  . Qualaquin [quinine sulfate] Unknown 03/17/2014  . Tylenol-codeine #3 [acetaminophen-codeine] Unknown 03/17/2014  . Ultram [tramadol] Unknown 03/17/2014    Medications: EncounterMedications        Outpatient Encounter Prescriptions as of 11/05/2016  Medication Sig Dispense Refill  . clindamycin (CLEOCIN) 300 MG capsule Take 1 capsule (300 mg total) by mouth 3 (three) times daily for 10 days. 30 capsule 0  . clotrimazole-betamethasone (LOTRISONE) 1-0.05 % cream As per above 45 g 2  . diclofenac (VOLTAREN) 1 % topical gel Apply 2 g topically 2 (two) times daily. 100 g 3  . esomeprazole (NEXIUM) 40 MG DR capsule TAKE 1 CAPSULE (40 MG TOTAL) BY MOUTH ONCE DAILY.  90 capsule 1  . gabapentin (NEURONTIN) 300 MG capsule TAKE 1 CAPSULE (300 MG TOTAL) BY MOUTH 3 (THREE) TIMES DAILY. 90 capsule 2  . levalbuterol (XOPENEX) 1.25 mg/0.5 mL nebulizer solution Take 0.5 mLs (1.25 mg total) by nebulization every 6 (six) hours as needed for Wheezing. 24 ampule 6  . levalbuterol (XOPENEX) 1.25 mg/3 mL nebulizer solution Take 3 mLs (1.25 mg total) by nebulization every 6 (six) hours as needed for Wheezing. 24 ampule 11  . metFORMIN (GLUCOPHAGE) 500 MG tablet Take 1 tablet (500 mg total)  by mouth 2 (two) times daily. 180 tablet 1  . methocarbamol (ROBAXIN) 500 MG tablet TAKE 1 TABLET BY MOUTH EVERY 8 HOURS AS NEEDED FOR MUSCLE SPASMS  1  . methocarbamol (ROBAXIN) 500 MG tablet Take 1 tablet (500 mg total) by mouth 4 (four) times daily for 10 days. 40 tablet 1  . mometasone-formoterol (DULERA) 100-5 mcg/actuation inhaler Inhale 2 inhalations into the lungs 2 (two) times daily. 1 Inhaler 12  . oxyCODONE (ROXICODONE) 5 MG immediate release tablet TAKE 1 TO 2 TABLETS BY MOUTH EVERY 4 HOURS AS NEEDED FOR MODERATE PAIN (1 TAB FOR 4-6/10, 2 TAB 7-8/  0  . oxyCODONE (ROXICODONE) 5 MG immediate release tablet Take 1 tablet (5 mg total) by mouth every 4 (four) hours as needed for Pain for up to 10 days. 60 tablet 0  . potassium chloride (KLOR-CON) 10 MEQ ER tablet Take 1 tablet (10 mEq total) by mouth once daily. 30 tablet 0  . simvastatin (ZOCOR) 20 MG tablet TAKE 1 TABLET (20 MG TOTAL) BY MOUTH NIGHTLY. 90 tablet 1  . triamterene-hydrochlorothiazide (DYAZIDE) 37.5-25 mg capsule TAKE 1 CAPSULE BY MOUTH ONCE DAILY. 30 capsule 5   No facility-administered encounter medications on file as of 11/05/2016.       Social History:      Social History  Substance Use Topics  . Smoking status: Former Smoker    Quit date: 03/22/2000  . Smokeless tobacco: Never Used  . Alcohol use No    Family Medical History:      Family History  Problem Relation Age of Onset  . Stroke Mother   . Myocardial Infarction (Heart attack) Mother   . No Known Problems Father   . Breast cancer Sister   . Stroke Sister   . Myocardial Infarction (Heart attack) Sister     Physical Examination:    Vitals:   11/05/16 0905  BP: (!) 169/91  Pulse: 96  Temp: 37.1 C (98.8 F)  TempSrc: Oral  Height: 149.9 cm (4\' 11" )  PainSc:   6  PainLoc: Back    General:             Patient is well developed, well nourished, calm, collected, and in no apparent distress.  Psychiatric:        Patient  is non-anxious.  Head:                 Pupils equal, round, and reactive to light.  ENT:                  Oral mucosa appears well hydrated.  Neck:                 Supple.  Full range of motion.  Respiratory:       Patient is breathing without any difficulty.  Extremities:        No edema.  Vascular:  Palpable pulses.  Skin:                  On exposed skin, there are no abnormal skin lesions.  NEUROLOGICAL:  General: In no acute distress.   Awake, alert, oriented to person, place, and time.  Pupils equal round and reactive to light.  Facial tone is symmetric.  Tongue protrusion is midline.      Strength: Side Biceps Triceps Deltoid Interossei Grip Wrist Ext. Wrist Flex.  R 5 5 5 5 5 5 5   L 5 5 5 5 5 5 5    Side Iliopsoas Quads Hamstring PF DF EHL  R 5 5 5 5 5 5   L 5 5 5 5 5 5     Wound: dehiscence through dermis; yellow slough/granulation. Nylon sutures in place. Erythematous skin edges.   Imaging: No new imaging  I have personally reviewed the images and agree with the above interpretation.  Assessment and Plan: Ms. Perrier is a pleasant 66 y.o. female with evidence of a wound dehiscence and increasing drainage.  She does not appear septic at this time.  I will have her admitted to the hospital and continue to have her n.p.o. and schedule for a wound revision today.  Antibiotic regimen will start after cultures were obtained intraoperatively.  I discussed these findings with her and she understood the above aforementioned plan.  Options were answered  Thank you for involving me in the care of this patient. I will keep you apprised of the patient's progress.   This note was partially dictated using voice recognition software, so please excuse any errors that were not corrected.     Era Bumpers, MD

## 2016-11-06 ENCOUNTER — Encounter: Payer: Self-pay | Admitting: Neurosurgery

## 2016-11-06 LAB — BASIC METABOLIC PANEL
Anion gap: 6 (ref 5–15)
BUN: 26 mg/dL — AB (ref 6–20)
CHLORIDE: 108 mmol/L (ref 101–111)
CO2: 23 mmol/L (ref 22–32)
Calcium: 8.1 mg/dL — ABNORMAL LOW (ref 8.9–10.3)
Creatinine, Ser: 1.23 mg/dL — ABNORMAL HIGH (ref 0.44–1.00)
GFR calc Af Amer: 53 mL/min — ABNORMAL LOW (ref >60)
GFR calc non Af Amer: 46 mL/min — ABNORMAL LOW (ref >60)
Glucose, Bld: 172 mg/dL — ABNORMAL HIGH (ref 65–99)
POTASSIUM: 4.7 mmol/L (ref 3.5–5.1)
Sodium: 137 mmol/L (ref 135–145)

## 2016-11-06 LAB — GLUCOSE, CAPILLARY
Glucose-Capillary: 167 mg/dL — ABNORMAL HIGH (ref 65–99)
Glucose-Capillary: 189 mg/dL — ABNORMAL HIGH (ref 65–99)
Glucose-Capillary: 125 mg/dL — ABNORMAL HIGH (ref 65–99)
Glucose-Capillary: 187 mg/dL — ABNORMAL HIGH (ref 65–99)

## 2016-11-06 LAB — SEDIMENTATION RATE: SED RATE: 46 mm/h — AB (ref 0–30)

## 2016-11-06 MED ORDER — CLINDAMYCIN HCL 150 MG PO CAPS
300.0000 mg | ORAL_CAPSULE | Freq: Three times a day (TID) | ORAL | Status: DC
  Administered 2016-11-06 – 2016-11-08 (×6): 300 mg via ORAL
  Filled 2016-11-06 (×3): qty 2
  Filled 2016-11-06: qty 1
  Filled 2016-11-06: qty 2
  Filled 2016-11-06: qty 1
  Filled 2016-11-06 (×2): qty 2

## 2016-11-06 MED ORDER — METHOCARBAMOL 500 MG PO TABS: 500.0000 mg | ORAL_TABLET | Freq: Three times a day (TID) | ORAL | 1 refills | Status: DC | PRN

## 2016-11-06 MED ORDER — CLINDAMYCIN HCL 300 MG PO CAPS: 300.0000 mg | ORAL_CAPSULE | Freq: Three times a day (TID) | ORAL | 0 refills | Status: DC

## 2016-11-06 NOTE — Progress Notes (Signed)
@  Progress Note   Date: 11/06/2016  24 Hours: no acute issues overnight. Taken to OR yesterday afternoon for wound revision.   HPI:          Problem List Patient Active Problem List   Diagnosis Date Noted  . Wound dehiscence 11/05/2016  . Lumbar stenosis 10/15/2016    Medications: Scheduled Meds: . acetaminophen  1,000 mg Oral Q8H  . docusate sodium  100 mg Oral BID  . enoxaparin (LOVENOX) injection  40 mg Subcutaneous Q24H  . gabapentin  300 mg Oral TID  . insulin aspart  0-15 Units Subcutaneous TID WC  . metFORMIN  500 mg Oral BID WC  . mometasone-formoterol  2 puff Inhalation BID  . pantoprazole  40 mg Oral QAC breakfast  . predniSONE  10 mg Oral Q breakfast  . simvastatin  20 mg Oral QHS  . triamterene-hydrochlorothiazide  1 tablet Oral Daily  . vancomycin  1,000 mg Intravenous Q24H   Continuous Infusions: PRN Meds:.acetaminophen **OR** acetaminophen, albuterol, methocarbamol **OR** methocarbamol (ROBAXIN)  IV, ondansetron **OR** ondansetron (ZOFRAN) IV, oxyCODONE  Labs:  Results for orders placed or performed during the hospital encounter of 11/05/16 (from the past 24 hour(s))  Glucose, capillary   Collection Time: 11/05/16  2:36 PM  Result Value Ref Range   Glucose-Capillary 91 65 - 99 mg/dL  MRSA PCR Screening   Collection Time: 11/05/16  3:04 PM  Result Value Ref Range   MRSA by PCR NEGATIVE NEGATIVE  Aerobic/Anaerobic Culture (surgical/deep wound)   Collection Time: 11/05/16  3:45 PM  Result Value Ref Range   Specimen Description WOUND    Special Requests NONE    Gram Stain      FEW WBC PRESENT,BOTH PMN AND MONONUCLEAR NO ORGANISMS SEEN Performed at [facility], [address] [place], [place] [address]    Culture PENDING    Report Status PENDING   Glucose, capillary   Collection Time: 11/05/16  6:07 PM  Result Value Ref Range   Glucose-Capillary 198 (H) 65 - 99 mg/dL  Glucose, capillary   Collection Time: 11/05/16 10:57 PM   Result Value Ref Range   Glucose-Capillary 179 (H) 65 - 99 mg/dL   Comment 1 Notify RN   Basic metabolic panel   Collection Time: 11/06/16  5:25 AM  Result Value Ref Range   Sodium 137 135 - 145 mmol/L   Potassium 4.7 3.5 - 5.1 mmol/L   Chloride 108 101 - 111 mmol/L   CO2 23 22 - 32 mmol/L   Glucose, Bld 172 (H) 65 - 99 mg/dL   BUN 26 (H) 6 - 20 mg/dL   Creatinine, Ser 1.23 (H) 0.44 - 1.00 mg/dL   Calcium 8.1 (L) 8.9 - 10.3 mg/dL   GFR calc non Af Amer 46 (L) >60 mL/min   GFR calc Af Amer 53 (L) >60 mL/min   Anion gap 6 5 - 15  Sedimentation rate   Collection Time: 11/06/16  5:25 AM  Result Value Ref Range   Sed Rate 46 (H) 0 - 30 mm/hr    Lab Results  Component Value Date   WBC 19.2 (H) 10/17/2016   WBC 14.0 (H) 10/09/2016   HCT 34.9 (L) 10/17/2016   HCT 40.0 10/15/2016   HCT 41.5 12/10/2011   PLT 242 10/17/2016   PLT 275 10/09/2016   PLT 226 12/10/2011    Lab Results  Component Value Date   INR 0.96 10/09/2016   APTT 28 10/09/2016   Lab  Results  Component Value Date   NA 137 11/06/2016   NA 136 10/17/2016   NA 142 12/10/2011   K 4.7 11/06/2016   K 4.6 10/17/2016   K 3.4 (L) 12/10/2011   BUN 26 (H) 11/06/2016   BUN 41 (H) 10/17/2016   BUN 31 (H) 12/10/2011   No results found for: MG  Imaging:  No results found.  Vital Signs: Temp:  [96.8 F (36 C)-98.4 F (36.9 C)] 98.4 F (36.9 C) (01/30 0413) Pulse Rate:  [73-90] 79 (01/30 0413) Resp:  [16-24] 18 (01/30 0413) BP: (106-153)/(54-103) 108/56 (01/30 0413) SpO2:  [96 %-100 %] 100 % (01/30 0413) Weight:  [98.4 kg (217 lb)] 98.4 kg (217 lb) (01/29 1234) Temp (24hrs), Avg:97.7 F (36.5 C), Min:96.8 F (36 C), Max:98.4 F (36.9 C)  Weight: 98.4 kg (217 lb) @IOLAST2SHIFTS @  Physical Exam:    5/5 strength all ext. Light touch intact Bandage: c/d/i JP drain in place.   A/P: 66yo F POD 1 from lumbar wound revision for dehiscence. Cultures negative to date. Neuro exam stable -continue dressing/jp  drain x 1 more day -PT -dc IVF -f/u culture data -mobilize -dvt prophylaxis     @ESIGNATURE @ Era Bumpers, MD    NOTE: This document was prepared using a combination of digital dictation and SmartPhrase techonology. Any transcriptional errors that result from this process are unintentional.

## 2016-11-06 NOTE — Progress Notes (Addendum)
Pt alert and oriented. Medicated for pain with minimal relief.  Ambulated to bathroom  with assistance. Pt stated she was dizzy when she got up. Small amount serosanguinous drainage oozed after ambulation. Dsg reinforced. Reinforced dsg dry and intact. No acute distress noted.  Staff will continue to monitor

## 2016-11-06 NOTE — Discharge Instructions (Addendum)
NEUROSURGERY DISCHARGE INSTRUCTIONS  Admission Diagnosis: Wound Dehiscence  Discharge Diagnosis: Wound Dehiscence  Operative procedure: Irrigation and Debridement Lumbar Wound   The following are instructions to help in your recovery once you have been discharged from the hospital. Even if you feel well, it is important that you follow these activity guidelines. If you do not let your neck heal properly from the surgery, you can increase the chance of return of your symptoms and other complications.   What to do after you leave the hospital:  Recommended diet:  Increase protein intake to promote wound healing. You may return to your usual diet. Continue to limit sugar intake as good blood sugar control will promote healing. Be sure to stay hydrated.   Recommended activity: No bending, lifting, or twisting (BLT). Avoid lifting objects heavier than 10 pounds (gallon milk jug). Where possible, avoid household activities that involve lifting, bending, reaching, pushing, or pulling such as laundry, vacuuming, grocery shopping, and childcare. Try to arrange for help from friends and family for these activities while your back heals.   Increase physical activity slowly as tolerated. Taking short walks is encouraged, but avoid strenuous exercise. Do not jog, run, bicycle, lift weights, or participate in any other exercises unless specifically allowed by your doctor.   You should not drive until cleared by your doctor.   Until released by your doctor, you should not return to work or school. You should rest at home and let your body heal.   You may shower at home. After showering, lightly dab your incision dry. Do not take a tub bath or go swimming until approved by your doctor at your follow-up appointment. Be sure to clean wound daily and change dressing to prevent infection.  If you smoke, we strongly recommend that you quit. Smoking has been proven to interfere with normal bone healing and will  dramatically reduce the success rate of your surgery. Please contact [facility] (800-QUIT-NOW) and use the resources at www.[facility].com for assistance in stopping smoking.   Medications  You may restart home medications.  You should take the antibiotic, Clindamycin, for the next week.   You may continue to take pain medication as prescribed, you should continue stool softeners to help with constipation  Special Instructions   You have stitches on your incision, you should have a follow up scheduled for removal.  Please Report any of the following: You may experience pain in your back. This is normal and should improve in the next few weeks with the help of pain medication, muscle relaxers, and rest. Some patients report that a warm compress on the back  helps.   However, should you experience any of the following, contact us immediately:   New numbness or weakness   Pain that is progressively getting worse, and is not relieved by your pain medication, muscle relaxers, rest, and warm compresses   Bleeding, redness, swelling, pain, or drainage from surgical incision   Chills or flu-like symptoms   Fever greater than 101.0 F (38.3 C)   Inability to eat, drink fluids, or take medications   Problems with bowel or bladder functions   Difficulty breathing or shortness of breath   Warmth, tenderness, or swelling in your calf    Additional Follow up appointments During office hours (Monday-Friday 9 am to 5 pm), please call your physician at [phone]  After hours and weekends, please call the [facility] Operator at  [phone] and ask for the Neurosurgeon On Call   For a  life-threatening emergency, call 911    Please see below for scheduled appointments:  10AM on 2/13 with Dr. Lacinda Axon

## 2016-11-06 NOTE — Evaluation (Signed)
Physical Therapy Evaluation Patient Details Name: [name] [name] MRN: [id number] DOB: [birth year] Today's Date: 11/06/2016   History of Present Illness  Pt is a 66 y.o. female presenting with increasing wound drainage and dehiscence through dermis (pt s/p L3-5 laminectomy 10/14/16).  Pt s/p 11/05/16 lumbar wound debridement/revision for dehiscence.    Clinical Impression  Prior to hospital admission, pt was ambulating modified independent with RW since recent back surgery.  Pt lives with her son who assists pt with donning brace and for bed mobility (1 level apt with level entry).  Currently pt is SBA with transfers and CGA with ambulation 120 feet with RW; pt min assist sit to supine bed mobility via logrolling.  Pt would benefit from skilled PT to address noted impairments and functional limitations.  Recommend pt discharge to home with HHPT when medically appropriate and prior level assist of her son.    Follow Up Recommendations Home health PT    Equipment Recommendations  Rolling walker with 5" wheels (pt already owns RW)    Recommendations for Other Services       Precautions / Restrictions Precautions Precautions: Back;Fall Precaution Booklet Issued: Yes (comment) Precaution Comments: able to recall 2/3 precautions Required Braces or Orthoses: Spinal Brace Spinal Brace: Other (comment);Lumbar corset (applied in standing d/t body habitus) Restrictions Weight Bearing Restrictions: No      Mobility  Bed Mobility Overal bed mobility: Needs Assistance Bed Mobility: Sit to Sidelying;Sit to Supine Rolling: Supervision     Sit to supine: Min assist Sit to sidelying: Min assist General bed mobility comments: HOB flat.  Use of bed rail.  Vc's required for logroll technique.  Transfers Overall transfer level: Needs assistance Equipment used: Rolling walker (2 wheeled) Transfers: Sit to/from [facility] Sit to Stand: Supervision Stand pivot transfers: Supervision        General transfer comment: mild increased effort to stand but steady without loss of balance (from toilet and from commode)  Ambulation/Gait Ambulation/Gait assistance: Min guard Ambulation Distance (Feet): 120 Feet Assistive device: Rolling walker (2 wheeled) Gait Pattern/deviations: Step-through pattern;Decreased stride length Gait velocity: decreased   General Gait Details: CGA for safety; fatigue noted with distance but no loss of balance noted  Stairs            Wheelchair Mobility    Modified Rankin (Stroke Patients Only)       Balance Overall balance assessment: Needs assistance Sitting-balance support: Bilateral upper extremity supported;Feet supported Sitting balance-Leahy Scale: Good     Standing balance support: Single extremity supported;During functional activity (performing toileting hygiene) Standing balance-Leahy Scale: Fair                               Pertinent Vitals/Pain Pain Assessment: 0-10 Pain Score: 8  (at rest 8/10; with ambulation 5/10) Pain Location: low back pain Pain Descriptors / Indicators: Sore;Tender Pain Intervention(s): Limited activity within patient's tolerance;Monitored during session;Repositioned (Pt declined pain meds)  Vitals (HR and O2 on room air) stable and WFL throughout treatment session.    Home Living Family/patient expects to be discharged to:: Private residence Living Arrangements: Children Available Help at Discharge: Family;Available 24 hours/day Type of Home: Apartment Home Access: Level entry     Home Layout: One level Home Equipment: Toilet riser;Walker - 2 wheels;Cane - single point;Shower seat - built in      Prior Function Level of Independence: Independent with assistive device(s)  Comments: Pt has been using RW since spinal surgery.  Requires some assist for donning back brace (son provides assist).  Has been receiving HHPT.     Hand Dominance         Extremity/Trunk Assessment   Upper Extremity Assessment Upper Extremity Assessment: Overall WFL for tasks assessed    Lower Extremity Assessment Lower Extremity Assessment: Overall WFL for tasks assessed RLE Deficits / Details: strength grossly at least 4/5 LLE Deficits / Details: strength grossly at least 4/5    Cervical / Trunk Assessment Cervical / Trunk Assessment: Other exceptions Cervical / Trunk Exceptions: s/p surgery documented above  Communication   Communication: No difficulties  Cognition Arousal/Alertness: Awake/alert Behavior During Therapy: WFL for tasks assessed/performed Overall Cognitive Status: Within Functional Limits for tasks assessed                      General Comments   Nursing cleared pt for participation in physical therapy.  Pt agreeable to PT session.    Exercises     Assessment/Plan    PT Assessment Patient needs continued PT services  PT Problem List Decreased strength;Decreased balance;Decreased mobility;Pain          PT Treatment Interventions DME instruction;Gait training;Functional mobility training;Therapeutic activities;Therapeutic exercise;Patient/family education    PT Goals (Current goals can be found in the Care Plan section)  Acute Rehab PT Goals Patient Stated Goal: to go home PT Goal Formulation: With patient Time For Goal Achievement: 11/20/16 Potential to Achieve Goals: Good    Frequency 7X/week   Barriers to discharge        Co-evaluation               End of Session Equipment Utilized During Treatment: Gait belt;Back brace Activity Tolerance: Patient tolerated treatment well;No increased pain Patient left: in bed;with call bell/phone within reach;with bed alarm set Nurse Communication: Mobility status;Precautions;Other (comment) (via white board)         Time: [id number] PT Time Calculation (min) (ACUTE ONLY): 25 min   Charges:   PT Evaluation $PT Eval Low Complexity: 1 Procedure PT  Treatments $Therapeutic Activity: 8-22 mins   PT G CodesLeitha Bleak 01-Dec-2016, 3:23 PM Leitha Bleak, [place]

## 2016-11-06 NOTE — Progress Notes (Signed)
PT Cancellation Note  Patient Details Name: NELWYN AUSTELL MRN: [id number] DOB: January 16, 1951   Cancelled Treatment:    Reason Eval/Treat Not Completed: Other (comment).  Pt reports her son is bringing in her brace from home (pt reports anticipated 1 pm delivery of brace).  Will re-attempt PT eval this afternoon.   Raquel Sarna [place] 11/06/2016, [place], [place]

## 2016-11-06 NOTE — Care Management (Signed)
PLEASE ADVISE IF PATIENT WILL NEED HOME DRESSING CHANGES. WILL NEED ORDER FOR HOME HEALTH NURSING AND PT.

## 2016-11-06 NOTE — Care Management (Signed)
Attempted RNCM assessment and patient is resting with eyes closed. Will assess in the am.

## 2016-11-07 LAB — GLUCOSE, CAPILLARY
GLUCOSE-CAPILLARY: 91 mg/dL (ref 65–99)
GLUCOSE-CAPILLARY: 195 mg/dL — AB (ref 65–99)
GLUCOSE-CAPILLARY: 158 mg/dL — AB (ref 65–99)
GLUCOSE-CAPILLARY: 178 mg/dL — AB (ref 65–99)

## 2016-11-07 MED ORDER — ENOXAPARIN SODIUM 40 MG/0.4ML ~~LOC~~ SOLN
40.0000 mg | Freq: Two times a day (BID) | SUBCUTANEOUS | Status: DC
  Administered 2016-11-07 – 2016-11-08 (×2): 40 mg via SUBCUTANEOUS
  Filled 2016-11-07 (×2): qty 0.4

## 2016-11-07 NOTE — Progress Notes (Signed)
Anticoagulation monitoring(Lovenox):  65yo  F ordered Lovenox 40 mg Q24h  Filed Weights   11/05/16 1234  Weight: 217 lb (98.4 kg)   BMI 43.9   Lab Results  Component Value Date   CREATININE 1.23 (H) 11/06/2016   CREATININE 1.54 (H) 10/17/2016   CREATININE 1.18 (H) 10/09/2016   Estimated Creatinine Clearance: 48.3 mL/min (by C-G formula based on SCr of 1.23 mg/dL (H)). Hemoglobin & Hematocrit     Component Value Date/Time   HGB 11.4 (L) 10/17/2016 0110   HGB 13.4 12/10/2011 0903   HCT 34.9 (L) 10/17/2016 0110   HCT 41.5 12/10/2011 0903     Per Protocol for Patient with estCrcl > 30 ml/min and BMI > 40, will transition to Lovenox 40 mg Q12h.      Chinita Greenland PharmD Clinical Pharmacist 11/07/2016

## 2016-11-07 NOTE — Progress Notes (Signed)
PT Cancellation Note  Patient Details Name: [name] [name] MRN: [id number] DOB: [birth year]   Cancelled Treatment:    Reason Eval/Treat Not Completed: Patient declined, no reason specified.  Nursing present and pt reporting just about to receive pain meds.  Pt requesting to be seen later d/t needing to take pain meds first but d/t the side effects of the pain meds, wants to be seen later.  Will re-attempt PT treatment later today as able.  Raquel Sarna [place] 11/07/2016, 10:04 AM Leitha Bleak, [place]

## 2016-11-07 NOTE — Care Management Note (Signed)
Case Management Note  Patient Details  Name: [name] [name] MRN: [id number] Date of Birth: [birth year]  Subjective/Objective:   Met with patient at bedside. She states she is open to [facility] for nursing and PT. Brad with [facility] notified of admission. Resumption of care orders in. It is anticipated that patient will be discharged tomorrow.                 Action/Plan: Resumption of HH services  Expected Discharge Date:                  Expected Discharge Plan:  [facility]  In-House Referral:     Discharge planning Services  CM Consult  Post Acute Care Choice:  Resumption of Svcs/PTA Provider, Home Health Choice offered to:  Patient  DME Arranged:    DME Agency:     HH Arranged:  RN, PT Divide Agency:  [facility]  Status of Service:  In process, will continue to follow  If discussed at Long Length of Stay Meetings, dates discussed:    Additional Comments:  Jolly Mango, RN 11/07/2016, 1:44 PM

## 2016-11-07 NOTE — Progress Notes (Signed)
Physical Therapy Treatment Patient Details Name: [name] [name] MRN: [id number] DOB: Jan 15, 1951 Today's Date: 11/07/2016    History of Present Illness Pt is a 66 y.o. female presenting with increasing wound drainage and dehiscence through dermis (pt s/p L3-5 laminectomy 10/14/16).  Pt s/p 11/05/16 lumbar wound debridement/revision for dehiscence.      PT Comments    Pt c/o 9/10 low back pain and pain under R breast (nursing present beginning of session and aware of 9/10 pain and gave pt pain meds).  Pt able to progress to ambulating around nursing loop with RW CGA; steady without loss of balance.  Pt requiring min assist to donn brace but able to doff brace on own (pt's son assists pt with donning brace at home).  Will continue to progress pt with strengthening and increasing functional mobility independence during hospital stay.   Follow Up Recommendations  Home health PT     Equipment Recommendations  Rolling walker with 5" wheels (pt already owns RW)    Recommendations for Other Services       Precautions / Restrictions Precautions Precautions: Back;Fall Precaution Booklet Issued: Yes (comment) Precaution Comments: able to recall 2/3 precautions Required Braces or Orthoses: Spinal Brace Spinal Brace: Other (comment);Lumbar corset (applied in standing d/t body  habitus) Restrictions Weight Bearing Restrictions: No    Mobility  Bed Mobility Overal bed mobility: Needs Assistance Bed Mobility: Supine to Sit;Sit to Supine Rolling: Supervision Sidelying to sit: Supervision Supine to sit: Supervision (use of bed rail) Sit to supine: Min guard Sit to sidelying: Min assist (assist for R LE) General bed mobility comments: HOB flat; vc's for logroll technique  Transfers Overall transfer level: Needs assistance Equipment used: Rolling walker (2 wheeled) Transfers: Sit to/from Stand Sit to Stand: Supervision         General transfer comment: mild increased effort to stand but  steady without loss of balance (from bed)  Ambulation/Gait Ambulation/Gait assistance: Min guard Ambulation Distance (Feet): 200 Feet Assistive device: Rolling walker (2 wheeled) Gait Pattern/deviations: Step-through pattern;Decreased stride length Gait velocity: decreased   General Gait Details: CGA for safety; fatigue noted with distance but no loss of balance noted   Stairs            Wheelchair Mobility    Modified Rankin (Stroke Patients Only)       Balance Overall balance assessment: Needs assistance Sitting-balance support: Bilateral upper extremity supported;Feet supported Sitting balance-Leahy Scale: Good     Standing balance support: No upper extremity supported Standing balance-Leahy Scale: Good Standing balance comment: static standing without UE support                    Cognition Arousal/Alertness: Awake/alert Behavior During Therapy: WFL for tasks assessed/performed Overall Cognitive Status: Within Functional Limits for tasks assessed                      Exercises General Exercises - Lower Extremity Long Arc Quad: AROM;Strengthening;Both;10 reps;Seated Hip Flexion/Marching: AROM;Strengthening;Both;10 reps;Seated (decreased AROM B LE's)    General Comments  Pt agreeable to PT session.      Pertinent Vitals/Pain Pain Assessment: 0-10 Pain Score: 9  Pain Location: low back pain Pain Descriptors / Indicators: Sore;Tender;Discomfort Pain Intervention(s): Limited activity within patient's tolerance;Monitored during session;Premedicated before session;Repositioned;RN gave pain meds during session    Home Living                      Prior Function  PT Goals (current goals can now be found in the care plan section) Acute Rehab PT Goals Patient Stated Goal: to go home PT Goal Formulation: With patient Time For Goal Achievement: 11/20/16 Potential to Achieve Goals: Good Progress towards PT goals:  Progressing toward goals    Frequency    7X/week      PT Plan Current plan remains appropriate    Co-evaluation             End of Session Equipment Utilized During Treatment: Gait belt;Back brace Activity Tolerance: Patient limited by pain Patient left: in bed;with call bell/phone within reach;with bed alarm set;with SCD's reapplied     Time: [id number] PT Time Calculation (min) (ACUTE ONLY): 23 min  Charges:  $Therapeutic Exercise: 8-22 mins $Therapeutic Activity: 8-22 mins                    G CodesLeitha Bleak Nov 28, 2016, 2:51 PM Leitha Bleak, [place]

## 2016-11-07 NOTE — Care Management (Signed)
LM for Dr. Lacinda Axon regarding discharge orders.

## 2016-11-08 LAB — GLUCOSE, CAPILLARY
GLUCOSE-CAPILLARY: 114 mg/dL — AB (ref 65–99)
Glucose-Capillary: 215 mg/dL — ABNORMAL HIGH (ref 65–99)
Glucose-Capillary: 140 mg/dL — ABNORMAL HIGH (ref 65–99)

## 2016-11-08 MED ORDER — OXYCODONE HCL 5 MG PO TABS: 5.0000 mg | ORAL_TABLET | ORAL | 0 refills | Status: DC | PRN

## 2016-11-08 MED ORDER — FLEET ENEMA 7-19 GM/118ML RE ENEM: 1.0000 | ENEMA | Freq: Once | RECTAL | Status: DC

## 2016-11-08 NOTE — Progress Notes (Signed)
Patient discharged home. DC instructions provided and explained. Medications reviewed. Rx given. ALl questions answered. Pt verbalized will call Md office to verify office appt. Pt stable at discharge.

## 2016-11-08 NOTE — Progress Notes (Signed)
Physical Therapy Treatment Patient Details Name: [name] [name] MRN: [id number] DOB: 08-Oct-1951 Today's Date: 11/08/2016    History of Present Illness Pt is a 66 y.o. female presenting with increasing wound drainage and dehiscence through dermis (pt s/p L3-5 laminectomy 10/14/16).  Pt s/p 11/05/16 lumbar wound debridement/revision for dehiscence.      PT Comments    Pt's pain initially 7/10 low back but after toileting and ambulating 120 feet with RW (lumbar corset donned) pt reporting pain increased to 10/10.  Pt assisted back into bed and repositioned to promote comfort and nursing notified immediately of pt's request for pain meds and muscle relaxer.  Will continue to progress pt with strengthening and progressive functional mobility per pt tolerance.   Follow Up Recommendations  Home health PT     Equipment Recommendations  Rolling walker with 5" wheels (pt already owns RW)    Recommendations for Other Services       Precautions / Restrictions Precautions Precautions: Back;Fall Precaution Booklet Issued: Yes (comment) Precaution Comments: able to recall 3/3 precautions Required Braces or Orthoses: Spinal Brace Spinal Brace: Other (comment);Lumbar corset (applied in standing d/t body habitus) Restrictions Weight Bearing Restrictions: No    Mobility  Bed Mobility Overal bed mobility: Needs Assistance Bed Mobility: Supine to Sit;Sit to Supine;Rolling Rolling: Supervision Sidelying to sit: Min assist (assist for trunk)   Sit to supine: Min assist Sit to sidelying: Min assist (assist for R LE into bed) General bed mobility comments: HOB flat; vc's for logroll technique  Transfers Overall transfer level: Needs assistance Equipment used: Rolling walker (2 wheeled) Transfers: Sit to/from Stand Sit to Stand: Supervision Stand pivot transfers: Supervision       General transfer comment: mild increased effort to stand but steady without loss of balance (from bed and from  toilet)  Ambulation/Gait Ambulation/Gait assistance: Min guard Ambulation Distance (Feet): 120 Feet Assistive device: Rolling walker (2 wheeled) Gait Pattern/deviations: Step-through pattern;Decreased stride length Gait velocity: decreased   General Gait Details: CGA for safety; fatigue noted with distance but no loss of balance noted   Stairs            Wheelchair Mobility    Modified Rankin (Stroke Patients Only)       Balance Overall balance assessment: Needs assistance Sitting-balance support: Bilateral upper extremity supported;Feet supported Sitting balance-Leahy Scale: Good     Standing balance support: Single extremity supported Standing balance-Leahy Scale: Good Standing balance comment: standing performing toileting hygiene                    Cognition Arousal/Alertness: Awake/alert Behavior During Therapy: WFL for tasks assessed/performed Overall Cognitive Status: Within Functional Limits for tasks assessed                      Exercises      General Comments  Pt agreeable to PT session.      Pertinent Vitals/Pain Pain Assessment: No/denies pain Pain Score: 10-Worst pain ever (7/10 beginning of session; 10/10 end of session (nursing notified)) Pain Location: low back pain Pain Descriptors / Indicators: Sore;Tender;Discomfort Pain Intervention(s): Limited activity within patient's tolerance;Monitored during session;Repositioned;Patient requesting pain meds-RN notified    Home Living                      Prior Function            PT Goals (current goals can now be found in the care plan section) Acute Rehab PT  Goals Patient Stated Goal: to go home PT Goal Formulation: With patient Time For Goal Achievement: 11/20/16 Potential to Achieve Goals: Good Progress towards PT goals: Progressing toward goals    Frequency    7X/week      PT Plan Current plan remains appropriate    Co-evaluation              End of Session Equipment Utilized During Treatment: Gait belt;Back brace Activity Tolerance: Patient limited by pain Patient left: in bed;with call bell/phone within reach;with bed alarm set;with SCD's reapplied     Time: 1235-1305 PT Time Calculation (min) (ACUTE ONLY): 30 min  Charges:  $Therapeutic Activity: 23-37 mins                    G CodesLeitha Bleak 2016-12-02, 4:13 PM Leitha Bleak, [place]

## 2016-11-11 LAB — AEROBIC/ANAEROBIC CULTURE W GRAM STAIN (SURGICAL/DEEP WOUND): Culture: NO GROWTH

## 2016-11-11 LAB — AEROBIC/ANAEROBIC CULTURE (SURGICAL/DEEP WOUND)

## 2016-11-13 ENCOUNTER — Encounter: Payer: Medicare Other | Attending: Internal Medicine | Admitting: Internal Medicine

## 2016-11-13 DIAGNOSIS — Z6841 Body Mass Index (BMI) 40.0 and over, adult: Secondary | ICD-10-CM | POA: Diagnosis not present

## 2016-11-13 DIAGNOSIS — M48062 Spinal stenosis, lumbar region with neurogenic claudication: Secondary | ICD-10-CM | POA: Diagnosis not present

## 2016-11-13 DIAGNOSIS — T8131XD Disruption of external operation (surgical) wound, not elsewhere classified, subsequent encounter: Secondary | ICD-10-CM | POA: Insufficient documentation

## 2016-11-13 DIAGNOSIS — I10 Essential (primary) hypertension: Secondary | ICD-10-CM | POA: Insufficient documentation

## 2016-11-13 DIAGNOSIS — M1990 Unspecified osteoarthritis, unspecified site: Secondary | ICD-10-CM | POA: Insufficient documentation

## 2016-11-13 DIAGNOSIS — L98428 Non-pressure chronic ulcer of back with other specified severity: Secondary | ICD-10-CM | POA: Insufficient documentation

## 2016-11-13 DIAGNOSIS — Z7984 Long term (current) use of oral hypoglycemic drugs: Secondary | ICD-10-CM | POA: Insufficient documentation

## 2016-11-13 DIAGNOSIS — K219 Gastro-esophageal reflux disease without esophagitis: Secondary | ICD-10-CM | POA: Diagnosis not present

## 2016-11-13 DIAGNOSIS — E1151 Type 2 diabetes mellitus with diabetic peripheral angiopathy without gangrene: Secondary | ICD-10-CM | POA: Insufficient documentation

## 2016-11-13 DIAGNOSIS — E11610 Type 2 diabetes mellitus with diabetic neuropathic arthropathy: Secondary | ICD-10-CM | POA: Insufficient documentation

## 2016-11-13 DIAGNOSIS — Y839 Surgical procedure, unspecified as the cause of abnormal reaction of the patient, or of later complication, without mention of misadventure at the time of the procedure: Secondary | ICD-10-CM | POA: Diagnosis not present

## 2016-11-14 NOTE — Progress Notes (Signed)
[name], [name] ([id number]) Visit Report for 11/13/2016 Abuse/Suicide Risk Screen Details Patient Name: [name] [name], [name] [name]. Date of Service: 11/13/2016 9:00 AM Medical Record Patient Account Number: [id number] Number: Treating RN: Baruch Gouty, RN, BSN, Rita 05-Apr-1951 [phone] y.o. Other Clinician: Date of Birth/Sex: Female) Treating ROBSON, MICHAEL Primary Care Clyda Smyth: Tracie Harrier Merridith Dershem/Extender: G Referring Ellasyn Swilling: [place] [facility] in Treatment: 0 Abuse/Suicide Risk Screen Items Answer ABUSE/SUICIDE RISK SCREEN: Has anyone close to you tried to hurt or harm you recentlyo No Do you feel uncomfortable with anyone in your familyo No Has anyone forced you do things that you didnot want to doo No Do you have any thoughts of harming yourselfo No Patient displays signs or symptoms of abuse and/or neglect. No Electronic Signature(s) Signed: 11/13/2016 4:49:53 PM By: Regan Lemming BSN, RN Entered By: Regan Lemming on 11/13/2016 09:17:40 [name] [name] ([id number]) -------------------------------------------------------------------------------- Activities of Daily Living Details Patient Name: [name] [name], [name] [name]. Date of Service: 11/13/2016 9:00 AM Medical Record Patient Account Number: [id number] Number: Treating RN: Baruch Gouty, RN, BSN, Rita 10/26/1950 [phone] y.o. Other Clinician: Date of Birth/Sex: Female) Treating ROBSON, MICHAEL Primary Care Jomes Giraldo: Tracie Harrier Meosha Castanon/Extender: G Referring Tawona Filsinger: [place] [facility] in Treatment: 0 Activities of Daily Living Items Answer Activities of Daily Living (Please select one for each item) Drive Automobile Need Assistance Take Medications Completely Able Use Telephone Completely Able Care for Appearance Completely Able Use Toilet Completely Able Bath / Shower Completely Able Dress Self Completely Able Feed Self Completely Able Walk Completely Able Get In / Out Bed Completely [place] Need Assistance Shop for Self Need Assistance Electronic Signature(s) Signed: 11/13/2016 4:49:53 PM By: Regan Lemming BSN, RN Entered By: Regan Lemming on 11/13/2016 09:19:32 [name] [name] ([id number]) -------------------------------------------------------------------------------- Education Assessment Details Patient Name: [name] [name]. Date of Service: 11/13/2016 9:00 AM Medical Record Patient Account Number: [id number] Number: Treating RN: Baruch Gouty, RN, BSN, Rita 05-24-1951 [phone] y.o. Other Clinician: Date of Birth/Sex: Female) Treating ROBSON, MICHAEL Primary Care Cydni Reddoch: Tracie Harrier Jabari Swoveland/Extender: G Referring Hilliard Borges: [place] in Treatment: 0 Primary Learner Assessed: Patient Learning Preferences/Education Level/Primary Language Learning Preference: Explanation Highest Education Level: High School Preferred Language: English Cognitive Barrier Assessment/Beliefs Language Barrier: No Physical Barrier Assessment Impaired Vision: Yes Glasses Impaired Hearing: No Decreased Hand dexterity: No Knowledge/Comprehension Assessment Knowledge Level: High Comprehension Level: High Ability to understand written High instructions: Ability to understand verbal High instructions: Motivation Assessment Anxiety Level: Calm Cooperation: Cooperative Education Importance: Acknowledges Need Interest in Health Problems: Asks Questions Perception: Coherent Willingness to Engage in Self- High Management Activities: Readiness to Engage in Self- High Management Activities: Electronic Signature(s) Signed: 11/13/2016 4:49:53 PM By: Regan Lemming BSN, RN Entered By: Regan Lemming on 11/13/2016 09:19:01 [name], [name] ([name], [name] ([id number]) -------------------------------------------------------------------------------- Fall Risk Assessment Details Patient Name: [name] [name]. Date of Service: 11/13/2016 9:00 AM Medical  Record Patient Account Number: [id number] Number: Treating RN: Baruch Gouty, RN, BSN, Rita [birth year] [phone] y.o. Other Clinician: Date of Birth/Sex: Female) Treating ROBSON, MICHAEL Primary Care Dailey Alberson: Tracie Harrier Javier Mamone/Extender: G Referring Evona Westra: [place] in Treatment: 0 Fall Risk Assessment Items Have you had 2 or more falls in the last 12 monthso 0 No Have you had any fall that resulted in injury in the last 12 monthso 0 No FALL RISK ASSESSMENT: History of falling - immediate or within 3 months 0 No Secondary diagnosis 0 No Ambulatory aid None/bed rest/wheelchair/nurse 0 Yes Crutches/cane/walker 0  No Furniture 0 No IV Access/Saline Lock 0 No Gait/Training Normal/bed rest/immobile 0 No Weak 10 Yes Impaired 20 Yes Mental Status Oriented to own ability 0 Yes Electronic Signature(s) Signed: 11/13/2016 4:49:53 PM By: Regan Lemming BSN, RN Entered By: Regan Lemming on 11/13/2016 09:18:19 [name] [name] ([id number]) -------------------------------------------------------------------------------- Foot Assessment Details Patient Name: [name] [name], [name] [name]. Date of Service: 11/13/2016 9:00 AM Medical Record Patient Account Number: [id number] Number: Treating RN: Baruch Gouty, RN, BSN, Rita 1950/11/29 [phone] y.o. Other Clinician: Date of Birth/Sex: Female) Treating ROBSON, MICHAEL Primary Care Rennae Ferraiolo: Tracie Harrier Linsey Arteaga/Extender: G Referring Daily Crate: [place] in Treatment: 0 Foot Assessment Items Site Locations + = Sensation present, - = Sensation absent, C = Callus, U = Ulcer R = Redness, W = Warmth, M = Maceration, PU = Pre-ulcerative lesion F = Fissure, S = Swelling, D = Dryness Assessment Right: Left: Other Deformity: No No Prior Foot Ulcer: No No Prior Amputation: No No Charcot Joint: No No Ambulatory Status: Ambulatory With Help Assistance Device: Wheelchair Gait: Administrator, arts) Signed: 11/13/2016 4:49:53 PM By:  Regan Lemming BSN, RN Entered By: Regan Lemming on 11/13/2016 09:17:52 [name] [name] ([id number]) [name], [name] ([id number]) -------------------------------------------------------------------------------- Nutrition Risk Assessment Details Patient Name: [name] [name], [name] [name]. Date of Service: 11/13/2016 9:00 AM Medical Record Patient Account Number: [id number] Number: Treating RN: Baruch Gouty, RN, BSN, Rita 13-Feb-1951 [phone] y.o. Other Clinician: Date of Birth/Sex: Female) Treating ROBSON, MICHAEL Primary Care Lauranne Beyersdorf: Tracie Harrier Torrez Renfroe/Extender: G Referring Charleston Hankin: [place] in Treatment: 0 Height (in): 59 Weight (lbs): 215 Body Mass Index (BMI): 43.4 Nutrition Risk Assessment Items NUTRITION RISK SCREEN: I have an illness or condition that made me change the kind and/or 0 No amount of food I eat I eat fewer than two meals per day 0 No I eat few fruits and vegetables, or milk products 0 No I have three or more drinks of beer, liquor or wine almost every day 0 No I have tooth or mouth problems that make it hard for me to eat 0 No I don't always have enough money to buy the food I need 0 No I eat alone most of the time 0 No I take three or more different prescribed or over-the-counter drugs a 0 No day Without wanting to, I have lost or gained 10 pounds in the last six 0 No months I am not always physically able to shop, cook and/or feed myself 0 No Nutrition Protocols Good Risk Protocol 0 No interventions needed Moderate Risk Protocol Electronic Signature(s) Signed: 11/13/2016 4:49:53 PM By: Regan Lemming BSN, RN Entered By: Regan Lemming on 11/13/2016 09:18:02

## 2016-11-15 NOTE — Progress Notes (Addendum)
DEISSY, ILLES ([id number]) Visit Report for 11/13/2016 Allergy List Details Patient Name: [name], [name]. Date of Service: 11/13/2016 9:00 AM Medical Record Patient Account Number: [id number] Number: Treating RN: Baruch Gouty, RN, BSN, Rita Sep 27, 1951 [phone] y.o. Other Clinician: Date of Birth/Sex: Female) Treating ROBSON, MICHAEL Primary Care Lennell Shanks: Tracie Harrier Xiara Knisley/Extender: G Referring Addaline Peplinski: Deetta Perla Weeks in Treatment: 0 Allergies Active Allergies No known Allergies Allergy Notes Electronic Signature(s) Signed: 11/13/2016 4:49:53 PM By: Regan Lemming BSN, RN Entered By: Regan Lemming on 11/13/2016 09:19:54 [name] [name] ([id number]) -------------------------------------------------------------------------------- [place] Details Patient Name: [name], [name]. Date of Service: 11/13/2016 9:00 AM Medical Record Patient Account Number: [id number] Number: Treating RN: Baruch Gouty, RN, BSN, Rita 1951-09-21 [phone] y.o. Other Clinician: Date of Birth/Sex: Female) Treating ROBSON, MICHAEL Primary Care Sevannah Madia: Tracie Harrier Nussen Pullin/Extender: G Referring Rayder Sullenger: [place] Siren in Treatment: 0 Visit Information Patient Arrived: Wheel Chair Arrival Time: 09:12 Accompanied By: self Transfer Assistance: None Patient Identification Verified: Yes Secondary Verification Process Yes Completed: Patient Requires Transmission-Based No Precautions: Patient Has Alerts: No Electronic Signature(s) Signed: 11/13/2016 4:49:53 PM By: Regan Lemming BSN, RN Entered By: Regan Lemming on 11/13/2016 09:13:06 [name] [name] ([id number]) -------------------------------------------------------------------------------- Clinic Level of Care Assessment Details Patient Name: [name] [name]. Date of Service: 11/13/2016 9:00 AM Medical Record Patient Account Number: [id number] Number: Treating RN: Baruch Gouty, RN, BSN, Rita 1951-07-26 [phone] y.o. Other Clinician: Date  of Birth/Sex: Female) Treating ROBSON, MICHAEL Primary Care Karilynn Carranza: Tracie Harrier Verlie Hellenbrand/Extender: G Referring Cashae Weich: [place] Siren in Treatment: 0 Clinic Level of Care Assessment Items TOOL 2 Quantity Score []  - Use when only an EandM is performed on the INITIAL visit 0 ASSESSMENTS - Nursing Assessment / Reassessment X - General Physical Exam (combine w/ comprehensive assessment (listed just 1 20 below) when performed on new pt. evals) X - Comprehensive Assessment (HX, ROS, Risk Assessments, Wounds Hx, etc.) 1 25 ASSESSMENTS - Wound and Skin Assessment / Reassessment X - Simple Wound Assessment / Reassessment - one wound 1 5 []  - Complex Wound Assessment / Reassessment - multiple wounds 0 []  - Dermatologic / Skin Assessment (not related to wound area) 0 ASSESSMENTS - Ostomy and/or Continence Assessment and Care []  - Incontinence Assessment and Management 0 []  - Ostomy Care Assessment and Management (repouching, etc.) 0 PROCESS - Coordination of Care X - Simple Patient / Family Education for ongoing care 1 15 []  - Complex (extensive) Patient / Family Education for ongoing care 0 []  - Staff obtains Programmer, systems, Records, Test Results / Process Orders 0 []  - Staff telephones HHA, Nursing Homes / Clarify orders / etc 0 []  - Routine Transfer to another Facility (non-emergent condition) 0 []  - Routine Hospital Admission (non-emergent condition) 0 X - New Admissions / Biomedical engineer / Ordering NPWT, Apligraf, etc. 1 15 []  - Emergency Hospital Admission (emergent condition) 0 [name], [name]. ([id number]) []  - Simple Discharge Coordination 0 []  - Complex (extensive) Discharge Coordination 0 PROCESS - Special Needs []  - Pediatric / Minor Patient Management 0 []  - Isolation Patient Management 0 []  - Hearing / Language / Visual special needs 0 []  - Assessment of Community assistance (transportation, D/C planning, etc.) 0 []  - Additional assistance / Altered mentation  0 []  - Support Surface(s) Assessment (bed, cushion, seat, etc.) 0 INTERVENTIONS - Wound Cleansing / Measurement X - Wound Imaging (photographs - any number of wounds) 1 5 []  - Wound Tracing (instead of photographs) 0 X - Simple Wound Measurement - one wound 1 5 []  -  Complex Wound Measurement - multiple wounds 0 X - Simple Wound Cleansing - one wound 1 5 []  - Complex Wound Cleansing - multiple wounds 0 INTERVENTIONS - Wound Dressings []  - Small Wound Dressing one or multiple wounds 0 X - Medium Wound Dressing one or multiple wounds 1 15 []  - Large Wound Dressing one or multiple wounds 0 []  - Application of Medications - injection 0 INTERVENTIONS - Miscellaneous []  - External ear exam 0 []  - Specimen Collection (cultures, biopsies, blood, body fluids, etc.) 0 []  - Specimen(s) / Culture(s) sent or taken to Lab for analysis 0 []  - Patient Transfer (multiple staff / Harrel Lemon Lift / Similar devices) 0 []  - Simple Staple / Suture removal (25 or less) 0 [name], [name] K. ([id number]) []  - Complex Staple / Suture removal (26 or more) 0 []  - Hypo / Hyperglycemic Management (close monitor of Blood Glucose) 0 []  - Ankle / Brachial Index (ABI) - do not check if billed separately 0 Has the patient been seen at the hospital within the last three years: Yes Total Score: 110 Level Of Care: New/Established - Level 3 Electronic Signature(s) Signed: 11/13/2016 4:49:53 PM By: Regan Lemming BSN, RN Entered By: Regan Lemming on 11/13/2016 09:42:52 [name] [name] ([id number]) -------------------------------------------------------------------------------- Encounter Discharge Information Details Patient Name: [name], [name]. Date of Service: 11/13/2016 9:00 AM Medical Record Patient Account Number: [id number] Number: Treating RN: Baruch Gouty, RN, BSN, Rita 1951/04/25 [phone] y.o. Other Clinician: Date of Birth/Sex: Female) Treating ROBSON, MICHAEL Primary Care Anders Hohmann: Tracie Harrier Rubina Basinski/Extender:  G Referring Sheng Pritz: [place] Siren in Treatment: 0 Encounter Discharge Information Items Discharge Pain Level: 0 Discharge Condition: Stable Ambulatory Status: Wheelchair Discharge Destination: Home Transportation: Private Auto Accompanied By: self Schedule Follow-up Appointment: No Medication Reconciliation completed No and provided to Patient/Care Shamir Sedlar: Provided on Clinical Summary of Care: 11/13/2016 Form Type Recipient Paper Patient WS Electronic Signature(s) Signed: 11/13/2016 2:55:37 PM By: Regan Lemming BSN, RN Previous Signature: 11/13/2016 9:50:26 AM Version By: Ruthine Dose Entered By: Regan Lemming on 11/13/2016 14:55:36 Leather, [name] [name] ([id number]) -------------------------------------------------------------------------------- Lower Extremity Assessment Details Patient Name: [name] [name]. Date of Service: 11/13/2016 9:00 AM Medical Record Patient Account Number: [id number] Number: Treating RN: Baruch Gouty, RN, BSN, Rita 1950-12-27 [phone] y.o. Other Clinician: Date of Birth/Sex: Female) Treating ROBSON, MICHAEL Primary Care Keyante Durio: Tracie Harrier Ramatoulaye Pack/Extender: G Referring Jode Lippe: Deetta Perla Weeks in Treatment: 0 Electronic Signature(s) Signed: 11/13/2016 4:49:53 PM By: Regan Lemming BSN, RN Entered By: Regan Lemming on 11/13/2016 09:17:31 [name] [name] ([id number]) -------------------------------------------------------------------------------- Multi Wound Chart Details Patient Name: [name] [name]. Date of Service: 11/13/2016 9:00 AM Medical Record Patient Account Number: [id number] Number: Treating RN: Baruch Gouty, RN, BSN, Rita 12/15/1950 [phone] y.o. Other Clinician: Date of Birth/Sex: Female) Treating ROBSON, MICHAEL Primary Care Vannie Hochstetler: Tracie Harrier Cherilynn Schomburg/Extender: G Referring Angelli Baruch: [place] Siren in Treatment: 0 Vital Signs Height(in): 59 Pulse(bpm): 86 Weight(lbs): 215 Blood Pressure 145/76 (mmHg): Body Mass  Index(BMI): 43 Temperature(F): 97.6 Respiratory Rate 16 (breaths/min): Photos: [1:No Photos] [N/A:N/A] Wound Location: [1:Back - Medial] [N/A:N/A] Wounding Event: [1:Surgical Injury] [N/A:N/A] Primary Etiology: [1:Dehisced Wound] [N/A:N/A] Date Acquired: [1:10/15/2016] [N/A:N/A] Weeks of Treatment: [1:0] [N/A:N/A] Wound Status: [1:Open] [N/A:N/A] Measurements L x W x D 11.2x2x0.5 [N/A:N/A] (cm) Area (cm) : [1:17.593] [N/A:N/A] Volume (cm) : [1:8.796] [N/A:N/A] Classification: [1:Full Thickness Without Exposed Support Structures] [N/A:N/A] Exudate Amount: [1:Large] [N/A:N/A] Exudate Type: [1:Serosanguineous] [N/A:N/A] Exudate Color: [1:red, brown] [N/A:N/A] Wound Margin: [1:Distinct, outline attached] [N/A:N/A] Granulation Amount: [1:None Present (0%)] [N/A:N/A] Necrotic Amount: [1:Large (67-100%)] [  N/A:N/A] Exposed Structures: [1:Fascia: No Fat Layer (Subcutaneous Tissue) Exposed: No Tendon: No Muscle: No Joint: No Bone: No] [N/A:N/A] Limited to Skin Breakdown Epithelialization: None N/A N/A Periwound Skin Texture: Excoriation: No N/A N/A Induration: No Callus: No Crepitus: No Rash: No Scarring: No Periwound Skin Maceration: No N/A N/A Moisture: Dry/Scaly: No Periwound Skin Color: Atrophie Blanche: No N/A N/A Cyanosis: No Ecchymosis: No Erythema: No Hemosiderin Staining: No Mottled: No Pallor: No Rubor: No Temperature: Hot N/A N/A Tenderness on Yes N/A N/A Palpation: Wound Preparation: Ulcer Cleansing: N/A N/A Rinsed/Irrigated with Saline Topical Anesthetic Applied: Other: lidocaine 4% Treatment Notes Electronic Signature(s) Signed: 11/14/2016 4:28:51 PM By: Linton Ham MD Entered By: Linton Ham on 11/13/2016 09:50:58 [name] [name] ([id number]) -------------------------------------------------------------------------------- [place] Details Patient Name: [name], [name]. Date of Service: 11/13/2016 9:00 AM Medical Record  Patient Account Number: [id number] Number: Treating RN: Baruch Gouty, RN, BSN, Rita May 23, 1951 [phone] y.o. Other Clinician: Date of Birth/Sex: Female) Treating ROBSON, MICHAEL Primary Care Diora Bellizzi: Tracie Harrier Evalynne Locurto/Extender: G Referring Noorah Giammona: [place] Siren in Treatment: 0 Active Inactive Electronic Signature(s) Signed: 12/04/2016 11:45:44 AM By: Gretta Cool RN, BSN, Kim RN, BSN Signed: 02/05/2017 4:24:13 PM By: Regan Lemming BSN, RN Previous Signature: 11/13/2016 4:49:53 PM Version By: Regan Lemming BSN, RN Entered By: Gretta Cool, RN, BSN, Kim on 12/04/2016 11:45:43 [name] [name] ([id number]) -------------------------------------------------------------------------------- Pain Assessment Details Patient Name: [name], [name]. Date of Service: 11/13/2016 9:00 AM Medical Record Patient Account Number: [id number] Number: Treating RN: Baruch Gouty, RN, BSN, Rita August 20, 1951 [phone] y.o. Other Clinician: Date of Birth/Sex: Female) Treating ROBSON, MICHAEL Primary Care Ardath Lepak: Tracie Harrier Jaris Kohles/Extender: G Referring Azavion Bouillon: [place] Siren in Treatment: 0 Active Problems Location of Pain Severity and Description of Pain Patient Has Paino Yes Site Locations Pain Location: Pain in Ulcers With Dressing Change: Yes Rate the pain. Current Pain Level: 10 Character of Pain Describe the Pain: Aching, Tender Pain Management and Medication Current Pain Management: Medication: Yes Rest: Yes How does your pain impact your activities of daily livingo Sleep: Yes Bathing: Yes Appetite: Yes Relationship With Others: Yes Bladder Continence: Yes Emotions: Yes Bowel Continence: Yes Work: Yes Toileting: Yes Drive: Yes Dressing: Yes Hobbies: Yes Electronic Signature(s) Signed: 11/13/2016 4:49:53 PM By: Regan Lemming BSN, RN Entered By: Regan Lemming on 11/13/2016 09:13:30 [name] [name] ([id number]) [name] [name], [name] [name]  ([id number]) -------------------------------------------------------------------------------- Patient/Caregiver Education Details Patient Name: [name], [name]. Date of Service: 11/13/2016 9:00 AM Medical Record Patient Account Number: [id number] Number: Treating RN: Baruch Gouty, RN, BSN, Rita 1951-02-04 [phone] y.o. Other Clinician: Date of Birth/Gender: Female) Treating ROBSON, MICHAEL Primary Care Physician: Tracie Harrier Physician/Extender: G Referring Physician: [place] Siren in Treatment: 0 Education Assessment Education Provided To: Patient Education Topics Provided Welcome To [facility]: Methods: Explain/Verbal Responses: State content correctly Wound/Skin Impairment: Methods: Explain/Verbal Responses: State content correctly Electronic Signature(s) Signed: 11/13/2016 4:49:53 PM By: Regan Lemming BSN, RN Entered By: Regan Lemming on 11/13/2016 14:55:50 [name] [name] ([id number]) -------------------------------------------------------------------------------- Wound Assessment Details Patient Name: [name] [name]. Date of Service: 11/13/2016 9:00 AM Medical Record Patient Account Number: [id number] Number: Treating RN: Baruch Gouty, RN, BSN, Rita 03-Oct-1951 [phone] y.o. Other Clinician: Date of Birth/Sex: Female) Treating ROBSON, MICHAEL Primary Care Mayela Bullard: Tracie Harrier Lexy Meininger/Extender: G Referring Ariadna Setter: Deetta Perla Weeks in Treatment: 0 Wound Status Wound Number: 1 Primary Etiology: Dehisced Wound Wound Location: Back - Medial Wound Status: Open Wounding Event: Surgical Injury Date Acquired: 10/15/2016 Weeks Of Treatment: 0 Clustered Wound: No Photos Photo Uploaded  By: Regan Lemming on 11/13/2016 16:40:36 Wound Measurements Length: (cm) 11.2 Width: (cm) 2 Depth: (cm) 0.5 Area: (cm) 17.593 Volume: (cm) 8.796 % Reduction in Area: % Reduction in Volume: Epithelialization: None Tunneling: No Undermining: No Wound  Description Full Thickness Without Exposed Classification: Support Structures Wound Margin: Distinct, outline attached Exudate Large Amount: Exudate Type: Serosanguineous Exudate Color: red, brown Foul Odor After Cleansing: No Slough/Fibrino Yes Wound Bed Granulation Amount: None Present (0%) Exposed Structure [name], [name]. ([id number]) Necrotic Amount: Large (67-100%) Fascia Exposed: No Necrotic Quality: Adherent Slough Fat Layer (Subcutaneous Tissue) Exposed: No Tendon Exposed: No Muscle Exposed: No Joint Exposed: No Bone Exposed: No Limited to Skin Breakdown Periwound Skin Texture Texture Color No Abnormalities Noted: No No Abnormalities Noted: No Callus: No Atrophie Blanche: No Crepitus: No Cyanosis: No Excoriation: No Ecchymosis: No Induration: No Erythema: No Rash: No Hemosiderin Staining: No Scarring: No Mottled: No Pallor: No Moisture Rubor: No No Abnormalities Noted: No Dry / Scaly: No Temperature / Pain Maceration: No Temperature: Hot Tenderness on Palpation: Yes Wound Preparation Ulcer Cleansing: Rinsed/Irrigated with Saline Topical Anesthetic Applied: Other: lidocaine 4%, Electronic Signature(s) Signed: 11/13/2016 4:49:53 PM By: Regan Lemming BSN, RN Entered By: Regan Lemming on 11/13/2016 09:17:21 [name] [name] ([id number]) -------------------------------------------------------------------------------- [place] Details Patient Name: [name] [name]. Date of Service: 11/13/2016 9:00 AM Medical Record Patient Account Number: [id number] Number: Treating RN: Baruch Gouty, RN, BSN, Rita [birth year] [phone] y.o. Other Clinician: Date of Birth/Sex: Female) Treating ROBSON, MICHAEL Primary Care Donald Memoli: Tracie Harrier Genowefa Morga/Extender: G Referring Denton Grosser: [place] Siren in Treatment: 0 Vital Signs Time Taken: 09:13 Temperature (F): 97.6 Height (in): 59 Pulse (bpm): 86 Source: Stated Respiratory Rate (breaths/min): 16 Weight (lbs):  215 Blood Pressure (mmHg): 145/76 Source: Stated Reference Range: 80 - 120 mg / dl Body Mass Index (BMI): 43.4 Electronic Signature(s) Signed: 11/13/2016 4:49:53 PM By: Regan Lemming BSN, RN Entered By: Regan Lemming on 11/13/2016 09:14:02

## 2016-11-15 NOTE — Progress Notes (Signed)
[name], [name] ([id number]) Visit Report for 11/13/2016 Chief Complaint Document Details Patient Name: [name] [name], [name] [name]. Date of Service: 11/13/2016 9:00 AM Medical Record Patient Account Number: [id number] Number: Treating RN: Baruch Gouty, RN, BSN, Rita Jul 15, 1951 [phone] y.o. Other Clinician: Date of Birth/Sex: Female) Treating ROBSON, MICHAEL Primary Care Provider: Tracie Harrier Provider/Extender: G Referring Provider: [place] Siren in Treatment: 0 Information Obtained from: Patient Chief Complaint 11/13/16; patient is here for review of a wound on her lower back which is a postsurgical wound dehiscence Electronic Signature(s) Signed: 11/14/2016 4:28:51 PM By: Linton Ham MD Entered By: Linton Ham on 11/13/2016 09:51:46 Win[place], [name] ([id number]) -------------------------------------------------------------------------------- HPI Details Patient Name: [name], [name]. Date of Service: 11/13/2016 9:00 AM Medical Record Patient Account Number: [id number] Number: Treating RN: Baruch Gouty, RN, BSN, Rita [birth year] [phone] y.o. Other Clinician: Date of Birth/Sex: Female) Treating ROBSON, MICHAEL Primary Care Provider: Tracie Harrier Provider/Extender: G Referring Provider: [place] Siren in Treatment: 0 History of Present Illness HPI Description: 11/13/16; this is a 66 year old diabetic patient who ended up with pain in her right leg. After investigation was discovered to have lumbar spinal stenosis and on 10/15/16 she underwent an L3 L5 lumbar laminectomy. At some point postoperatively the wound dehisced. She had to be taken back to the OR on 11/05/16 for a lumbar wound debridement secondary to ongoing drainage and poor wound healing. At this second surgery there was some fat necrosis and fluid deeper and this was cultured. The results of the culture were negative both in terms of a aerobic and anaerobic cultures. The patient states her right leg pain has improved  although she has numbness over a large area of her anterior right thigh. Her son is been cleaning this and they're applying a gauze-based dressing. She was seen by the nurse in the [facility] yesterday and apparently there are plans for suture removal in 2-3 weeks. However the wound is already dehisced again and has a nonviable surface. I'm going to see if they'll actually take these out today if there is not more to this and I'm saying then we can actually look at the wound surface. The patient has not been systemically unwell she is afebrile and is mentioned is not really experience much pain. Wound dimensions today are 11.2 x 2 x 0.5. Electronic Signature(s) Signed: 11/14/2016 4:28:51 PM By: Linton Ham MD Entered By: Linton Ham on 11/13/2016 09:58:38 Hardie Pulley ([id number]) -------------------------------------------------------------------------------- Physical Exam Details Patient Name: [name], [name]. Date of Service: 11/13/2016 9:00 AM Medical Record Patient Account Number: [id number] Number: Treating RN: Baruch Gouty, RN, BSN, Rita Mar 06, 1951 [phone] y.o. Other Clinician: Date of Birth/Sex: Female) Treating ROBSON, MICHAEL Primary Care Provider: Tracie Harrier Provider/Extender: G Referring Provider: [place] Siren in Treatment: 0 Constitutional Patient is hypertensive.. Pulse regular and within target range for patient.Marland Kitchen Respirations regular, non-labored and within target range.. Temperature is normal and within the target range for the patient.. Morbidly obese patient in no distress.. Eyes Conjunctivae clear. No discharge.Marland Kitchen Respiratory Respiratory effort is easy and symmetric bilaterally. Rate is normal at rest and on room air.. Bilateral breath sounds are clear and equal in all lobes with no wheezes, rales or rhonchi.. Cardiovascular Heart rhythm and rate regular, without murmur or gallop.. Gastrointestinal (GI) Abdomen is soft and non-distended  without masses or tenderness. Bowel sounds active in all quadrants.. Psychiatric Patient appears depressed today.. Notes Wound exam; the patient's subsequent surgical excision has again dehisced. There is still probably approximately 15 sutures. There  is no evidence of surrounding infection or crepitus. There was drainage notified by her intake nurse but I don't see any evidence of infection here. Electronic Signature(s) Signed: 11/14/2016 4:28:51 PM By: Linton Ham MD Entered By: Linton Ham on 11/13/2016 10:08:13 Hardie Pulley ([id number]) -------------------------------------------------------------------------------- Physician Orders Details Patient Name: [name], [name]. Date of Service: 11/13/2016 9:00 AM Medical Record Patient Account Number: [id number] Number: Treating RN: Baruch Gouty, RN, BSN, Rita [birth year] [phone] y.o. Other Clinician: Date of Birth/Sex: Female) Treating ROBSON, MICHAEL Primary Care Provider: Tracie Harrier Provider/Extender: G Referring Provider: [place] Siren in Treatment: 0 Verbal / Phone Orders: No Diagnosis Coding Wound Cleansing Wound #1 Medial Back o Cleanse wound with mild soap and water o May Shower, gently pat wound dry prior to applying new dressing. Primary Wound Dressing Wound #1 Medial Back o Saline moistened gauze Secondary Dressing Wound #1 Medial Back o ABD pad o Dry Gauze Dressing Change Frequency Wound #1 Medial Back o Change dressing twice daily. Follow-up Appointments Wound #1 Medial Back o Return Appointment in: - After sutures are removed by the surgeon..Dr. Lacinda Axon @ [facility] Additional Orders / Instructions Wound #1 Medial Back o Increase protein intake. o Activity as tolerated Electronic Signature(s) Signed: 11/13/2016 4:49:53 PM By: Regan Lemming BSN, RN Signed: 11/14/2016 4:28:51 PM By: Linton Ham MD Entered By: Regan Lemming on 11/13/2016 09:41:44 LILLIANNAH, [name]  ([id number]) [name], [name] ([id number]) -------------------------------------------------------------------------------- Problem List Details Patient Name: DEJHA, BORCHERT. Date of Service: 11/13/2016 9:00 AM Medical Record Patient Account Number: [id number] Number: Treating RN: Baruch Gouty, RN, BSN, Rita 08-22-1951 [phone] y.o. 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 Date of Service: 11/13/2016 9:00 AM Medical Record Patient Account Number: [id number] Number: Treating RN: Baruch Gouty, RN, BSN, Rita [birth year] [phone] y.o. Other Clinician: Date of Birth/Sex: Female) Treating ROBSON, MICHAEL Primary Care Provider: Tracie Harrier Provider/Extender: G Referring Provider: [place] Siren in Treatment: 0 Subjective Chief Complaint Information obtained from Patient 11/13/16; patient is here for review of a wound on her lower back which is a postsurgical wound dehiscence History of Present Illness (HPI) 11/13/16; this is a 66 year old diabetic patient who ended up with pain in her right leg. After investigation was discovered to have lumbar spinal stenosis and on  10/15/16 she underwent an L3 L5 lumbar laminectomy. At some point postoperatively the wound dehisced. She had to be taken back to the OR on 11/05/16 for a lumbar wound debridement secondary to ongoing drainage and poor wound healing. At this second surgery there was some fat necrosis and fluid deeper and this was cultured. The results of the culture were negative both in terms of a aerobic and anaerobic cultures. The patient states her right leg pain has improved although she has numbness over a large area of her anterior right thigh. Her son is been cleaning this and they're applying a gauze-based dressing. She was seen by the nurse in the [facility]  yesterday and apparently there are plans for suture removal in 2-3 weeks. However the wound is already dehisced again and has a nonviable surface. I'm going to see if they'll actually take these out today if there is not more to this and I'm saying then we can actually look at the wound surface. The patient has not been systemically unwell she is afebrile and is mentioned is not really experience much pain. Wound dimensions today are 11.2 x 2 x 0.5. Wound History Patient presents with 1 open wound that has been present for approximately 42month. Patient has been treating wound in the following manner: dry dressing. Laboratory tests have not been performed in the last month. Patient reportedly has not tested positive for an antibiotic resistant organism. Patient reportedly has not tested positive for osteomyelitis. Patient reportedly has not had testing performed to evaluate circulation in the legs. Patient experiences the following problems associated with their wounds: infection. Patient History Information obtained from Patient, Chart. Allergies No known Allergies Family History Cancer - Siblings, Diabetes - Mother, Siblings, Heart Disease - Father, Siblings, Hereditary Spherocytosis - NIERRA, SCHOLLE ([id number]) Siblings, Hypertension -  Siblings, Mother, Kidney Disease - Siblings, Father, Mother, Lung Disease - Siblings, Mother, Seizures - Siblings, Stroke - Siblings, No family history of Thyroid Problems, Tuberculosis. Social History Never smoker, Marital Status - Single, Alcohol Use - Never, Drug Use - No History, Caffeine Use - Moderate. Medical History Eyes Denies history of Cataracts, Glaucoma, Optic Neuritis Ear/Nose/Mouth/Throat Denies history of Chronic sinus problems/congestion, Middle ear problems Hematologic/Lymphatic Denies history of Anemia, Hemophilia, Human Immunodeficiency Virus, Lymphedema, Sickle Cell Disease Respiratory Patient has history of Asthma Denies history of Aspiration, Chronic Obstructive Pulmonary Disease (COPD), Pneumothorax, Sleep Apnea, Tuberculosis Cardiovascular Patient has history of Hypertension Gastrointestinal Denies history of Cirrhosis , Colitis, Crohn s, Hepatitis A, Hepatitis B, Hepatitis C Endocrine Patient has history of Type II Diabetes Genitourinary Denies history of End Stage Renal Disease Immunological Denies history of Lupus Erythematosus, Raynaud s, Scleroderma Integumentary (Skin) Denies history of History of Burn, History of pressure wounds Musculoskeletal Patient has history of Osteoarthritis Denies history of Gout, Rheumatoid Arthritis, Osteomyelitis Neurologic Denies history of Dementia, Neuropathy, Quadriplegia, Paraplegia, Seizure Disorder Oncologic Denies history of Received Chemotherapy, Received Radiation Psychiatric Denies history of Anorexia/bulimia, Confinement Anxiety Patient is treated with Oral Agents. Blood sugar is not tested. Medical And Surgical History Notes Gastrointestinal GERD Review of Systems (ROS) Constitutional Symptoms (General Health) The patient has no complaints or symptoms. Eyes ROBERT, DESIRE. ([id number]) Complains or has symptoms of Glasses / Contacts. Ear/Nose/Mouth/Throat The patient has no complaints or  symptoms. Hematologic/Lymphatic The patient has no complaints or symptoms. Respiratory The patient has no complaints or symptoms. Cardiovascular The patient has no complaints or symptoms. Gastrointestinal The patient has no complaints or symptoms. Endocrine The patient has no complaints or symptoms. Genitourinary The patient has no complaints or symptoms. Immunological The patient has no complaints or symptoms. Integumentary (Skin) Complains or has symptoms of Wounds. Musculoskeletal The patient has no complaints or symptoms. Neurologic The patient has no complaints or symptoms. Oncologic The patient has no complaints or symptoms. Psychiatric The patient has no complaints or symptoms. Objective Constitutional Patient is hypertensive.. Pulse regular and within target range for patient.Marland Kitchen Respirations regular, non-labored and within target range.. Temperature is normal and within the target range for the patient.. Morbidly obese patient in no distress.. Vitals Time Taken: 9:13 AM, Height: 59 in, Source: Stated, Weight: 215 lbs, Source: Stated, BMI: 43.4, Temperature: 97.6 F, Pulse:  86 bpm, Respiratory Rate: 16 breaths/min, Blood Pressure: 145/76 mmHg. Eyes Conjunctivae clear. No discharge.Marland Kitchen Respiratory Respiratory effort is easy and symmetric bilaterally. Rate is normal at rest and on room air.. Bilateral breath sounds are clear and equal in all lobes with no wheezes, rales or rhonchi.[name], [name] ([id number]) Cardiovascular Heart rhythm and rate regular, without murmur or gallop.. Gastrointestinal (GI) Abdomen is soft and non-distended without masses or tenderness. Bowel sounds active in all quadrants.. Psychiatric Patient appears depressed today.. General Notes: Wound exam; the patient's subsequent surgical excision has again dehisced. There is still probably approximately 15 sutures. There is no evidence of surrounding infection or crepitus. There was drainage  notified by her intake nurse but I don't see any evidence of infection here. Integumentary (Hair, Skin) Wound #1 status is Open. Original cause of wound was Surgical Injury. The wound is located on the Medial Back. The wound measures 11.2cm length x 2cm width x 0.5cm depth; 17.593cm^2 area and 8.796cm^3 volume. The wound is limited to skin breakdown. There is no tunneling or undermining noted. There is a large amount of serosanguineous drainage noted. The wound margin is distinct with the outline attached to the wound base. There is no granulation within the wound bed. There is a large (67-100%) amount of necrotic tissue within the wound bed including Adherent Slough. The periwound skin appearance did not exhibit: Callus, Crepitus, Excoriation, Induration, Rash, Scarring, Dry/Scaly, Maceration, Atrophie Blanche, Cyanosis, Ecchymosis, Hemosiderin Staining, Mottled, Pallor, Rubor, Erythema. Periwound temperature was noted as Hot. The periwound has tenderness on palpation. 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 JUANITA, SCHRIEBER K. ([id number]) Primary Wound Dressing: Wound #1 Medial Back: Saline moistened gauze Secondary Dressing: Wound #1 Medial Back: ABD pad Dry Gauze Dressing Change Frequency: Wound #1 Medial Back: Change dressing twice daily. Follow-up Appointments: Wound #1 Medial Back: Return Appointment in: - After sutures are removed by the surgeon..Dr. Lacinda Axon @ [facility] Additional Orders / Instructions: Wound #1 Medial Back: Increase protein intake. Activity as tolerated #1 I've  spoken to Dr. Lacinda Axon her surgeon on the phone. He'll see her this afternoon clinic and decide whether this needs to be re-debrided either in the office or the OR. He states that these superficial sutures are actually holding together a lot deeper wound. Asking about whether we can manage wound VAC I told him we could. An operative debridement with VAC placement would be in order. At this point I am not sure what Dr. Jonathon Jordan plans are. #2 I saw no evidence of infection at the bedside. #3 I would suggest wet to dry dressings if there is no debridement done here. Electronic Signature(s) Signed: 11/14/2016 4:28:51 PM By: Linton Ham MD Entered By: Linton Ham on 11/13/2016 10:10:34 Hardie Pulley ([id number]) -------------------------------------------------------------------------------- ROS/PFSH Details Patient Name: TAIMA, RIDGEWAY. Date of Service: 11/13/2016 9:00 AM Medical Record Patient Account Number: [id number] Number: Treating RN: Baruch Gouty, RN, BSN, Rita 02/06/1951 [phone] y.o. Other Clinician: Date of Birth/Sex: Female) Treating ROBSON, MICHAEL Primary Care Provider: Tracie Harrier Provider/Extender: G Referring Provider: [place] Siren in Treatment: 0 Information Obtained From Patient Chart Wound History Do you currently have one or more open woundso Yes How many  open wounds do you currently haveo 1 Approximately how long have you had your woundso 70month How have you been treating your wound(s) until nowo dry dressing Has your wound(s) ever healed and then re-openedo No Have you had any lab work done in the past montho No Have you tested positive for an antibiotic resistant organism (MRSA, VRE)o No Have you tested positive for osteomyelitis (bone infection)o No Have you had any tests for circulation on your legso No Have you had other problems associated with your woundso Infection Eyes Complaints and Symptoms: Positive for: Glasses / Contacts Medical  History: Negative for: Cataracts; Glaucoma; Optic Neuritis Integumentary (Skin) Complaints and Symptoms: Positive for: Wounds Medical History: Negative for: History of Burn; History of pressure wounds Constitutional Symptoms (General Health) Complaints and Symptoms: No Complaints or Symptoms Ear/Nose/Mouth/Throat Complaints and Symptoms: No Complaints or Symptoms MIANA, LENSCH ([id number]) Medical History: Negative for: Chronic sinus problems/congestion; Middle ear problems Hematologic/Lymphatic Complaints and Symptoms: No Complaints or Symptoms Medical History: Negative for: Anemia; Hemophilia; Human Immunodeficiency Virus; Lymphedema; Sickle Cell Disease Respiratory Complaints and Symptoms: No Complaints or Symptoms Medical History: Positive for: Asthma Negative for: Aspiration; Chronic Obstructive Pulmonary Disease (COPD); Pneumothorax; Sleep Apnea; Tuberculosis Cardiovascular Complaints and Symptoms: No Complaints or Symptoms Medical History: Positive for: Hypertension Gastrointestinal Complaints and Symptoms: No Complaints or Symptoms Medical History: Negative for: Cirrhosis ; Colitis; Crohnos; Hepatitis A; Hepatitis B; Hepatitis C Past Medical History Notes: GERD Endocrine Complaints and Symptoms: No Complaints or Symptoms Medical History: Positive for: Type II Diabetes Time with diabetes: 20 years Treated with: Oral agents Blood sugar tested every day: No SINDHU, IOVINE ([id number]) Genitourinary Complaints and Symptoms: No Complaints or Symptoms Medical History: Negative for: End Stage Renal Disease Immunological Complaints and Symptoms: No Complaints or Symptoms Medical History: Negative for: Lupus Erythematosus; Raynaudos; Scleroderma Musculoskeletal Complaints and Symptoms: No Complaints or Symptoms Medical History: Positive for: Osteoarthritis Negative for: Gout; Rheumatoid Arthritis; Osteomyelitis Neurologic Complaints and Symptoms: No  Complaints or Symptoms Medical History: Negative for: Dementia; Neuropathy; Quadriplegia; Paraplegia; Seizure Disorder Oncologic Complaints and Symptoms: No Complaints or Symptoms Medical History: Negative for: Received Chemotherapy; Received Radiation Psychiatric Complaints and Symptoms: No Complaints or Symptoms Medical History: Negative for: Anorexia/bulimia; Confinement Anxiety Family and Social History Cancer: Yes - Siblings; Diabetes: Yes - Mother, Siblings; Heart Disease: Yes - Father, Siblings; Hereditary Spherocytosis: Yes - Siblings; Hypertension: Yes - Siblings, Mother; Kidney Disease: Yes - Siblings, RONALDA, IVANOV ([id number]) Father, Mother; Lung Disease: Yes - Siblings, Mother; Seizures: Yes - Siblings; Stroke: Yes - Siblings; Thyroid Problems: No; Tuberculosis: No; Never smoker; Marital Status - Single; Alcohol Use: Never; Drug Use: No History; Caffeine Use: Moderate; Financial Concerns: No; Food, Clothing or Shelter Needs: No; Support System Lacking: No; Advanced Directives: No; Living Will: No Electronic Signature(s) Signed: 11/13/2016 4:49:53 PM By: Regan Lemming BSN, RN Signed: 11/14/2016 4:28:51 PM By: Linton Ham MD Entered By: Regan Lemming on 11/13/2016 09:23:51 Hardie Pulley ([id number]) -------------------------------------------------------------------------------- SuperBill Details Patient Name: SKYELA, BEVELS. Date of Service: 11/13/2016 Medical Record Patient Account Number: [id number] Number: Treating RN: Baruch Gouty, RN, BSN, Rita Dec 31, 1950 [phone] y.o. 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

## 2016-11-15 NOTE — Discharge Summary (Signed)
Admit Date: 11/05/2016 Discharge Date: 11/08/2016 Admitting Physician: Deetta Perla, MD  Discharge Physician: No att. providers found  Primary Care Provider: Tracie Harrier, MD  Admission Diagnoses:  wound dehscience  WOUND DEHISCENCE  Discharge Diagnoses:   Same  Consult Orders: None   Procedures Performed:     Surgeries Performed: Procedure(s): LUMBAR WOUND DEBRIDEMENT 11/05/2016  Brief History of Present Illness: [name] [name] to the clinic on 1/29with ongoing drainage and poor wound healing after a lumbar decompression earlier this month. She had been on antibiotics but given risk of infection with dehiscence, exploration and I&D was discussed. Therisks of surgery were explained to include hematoma, infection, damage to nerve roots, CSF leak, weakness, numbness, pain, need for future surgery, heart attack, and stroke. She elected to proceed with surgery for wound healing   Hospital Course: Patient underwent uncomplicated procedure by Dr. Lacinda Axon on 11/05/2016 and was ready for discharge 11/08/2016  Discharge Exam:  full strength in all extremities.  Wound c/d/i  Discharge Disposition:   home  Allergies: Allergies  Allergen Reactions  . Codeine Nausea And Vomiting  . Furosemide Other (See Comments)  . Penicillin V Potassium     Other reaction(s): Unknown  . Quinine     Other reaction(s): Unknown  . Tramadol     Other reaction(s): Unknown    Patient Instructions:  Discharge Medication List as of 11/08/2016  4:49 PM    START taking these medications   Details  clindamycin (CLEOCIN) 300 MG capsule Take 1 capsule (300 mg total) by mouth every 8 (eight) hours., Starting Tue 11/06/2016, Print    !! oxyCODONE (ROXICODONE) 5 MG immediate release tablet Take 1 tablet (5 mg total) by mouth every 4 (four) hours as needed for severe pain., Starting Thu 11/08/2016, Print     !! - Potential duplicate medications found. Please discuss with provider.    CONTINUE these  medications which have CHANGED   Details  methocarbamol (ROBAXIN) 500 MG tablet Take 1 tablet (500 mg total) by mouth every 8 (eight) hours as needed for muscle spasms., Starting Tue 11/06/2016, Print      CONTINUE these medications which have NOT CHANGED   Details  acetaminophen (TYLENOL) 650 MG CR tablet Take 650 mg by mouth daily., Historical Med    esomeprazole (NEXIUM) 40 MG capsule Take 40 mg by mouth daily at 12 noon., Historical Med    gabapentin (NEURONTIN) 300 MG capsule Take 300 mg by mouth daily., Historical Med    levalbuterol (XOPENEX) 1.25 MG/3ML nebulizer solution Inhale 1.25 mg into the lungs every 4 (four) hours as needed for wheezing or shortness of breath. , Starting Thu 08/16/2016, Until Fri 08/16/2017, Historical Med    meloxicam (MOBIC) 15 MG tablet Take 1 tablet (15 mg total) by mouth daily., Starting Mon 08/22/2015, Print    metFORMIN (GLUCOPHAGE) 500 MG tablet Take 500 mg by mouth 2 (two) times daily with a meal., Historical Med    mometasone-formoterol (DULERA) 100-5 MCG/ACT AERO Inhale 2 puffs into the lungs 2 (two) times daily., Historical Med    nystatin cream (MYCOSTATIN) Apply 1 application topically 2 (two) times daily., Starting Sun 07/31/2015, Print    !! oxyCODONE (OXY IR/ROXICODONE) 5 MG immediate release tablet Take 1-2 tablets (5-10 mg total) by mouth every 4 (four) hours as needed for moderate pain (5 mg for 4-6/10 pain, 10 mg for 7-8/10 pain)., Starting Mon 10/15/2016, Print    predniSONE (DELTASONE) 10 MG tablet Take 10 mg by mouth daily., Historical Med  senna (SENOKOT) 8.6 MG TABS tablet Take 1 tablet (8.6 mg total) by mouth 2 (two) times daily., Starting Tue 10/16/2016, Normal    simvastatin (ZOCOR) 20 MG tablet Take 20 mg by mouth daily., Historical Med    triamterene-hydrochlorothiazide (DYAZIDE) 37.5-25 MG capsule Take 1 capsule by mouth daily., Historical Med     !! - Potential duplicate medications found. Please discuss with provider.       Code Status:  Prior  Activity:  activity as tolerated  Diet: As tolerated  dd  Results Pending at Discharge:  none Follow-up Tests Recommended:  none  No future appointments.    Time Spent:  15 minutes  [name] East, MD 11/15/2016

## 2016-12-09 ENCOUNTER — Emergency Department: Payer: Medicare Other

## 2016-12-09 ENCOUNTER — Encounter: Payer: Self-pay | Admitting: Emergency Medicine

## 2016-12-09 ENCOUNTER — Emergency Department
Admission: EM | Admit: 2016-12-09 | Discharge: 2016-12-09 | Disposition: A | Discharge status: Home / Self Care | Payer: Medicare Other | Attending: Emergency Medicine | Admitting: Emergency Medicine

## 2016-12-09 DIAGNOSIS — Z87891 Personal history of nicotine dependence: Secondary | ICD-10-CM | POA: Diagnosis not present

## 2016-12-09 DIAGNOSIS — Y92410 Unspecified street and highway as the place of occurrence of the external cause: Secondary | ICD-10-CM | POA: Insufficient documentation

## 2016-12-09 DIAGNOSIS — E119 Type 2 diabetes mellitus without complications: Secondary | ICD-10-CM | POA: Diagnosis not present

## 2016-12-09 DIAGNOSIS — S8001XA Contusion of right knee, initial encounter: Secondary | ICD-10-CM | POA: Diagnosis not present

## 2016-12-09 DIAGNOSIS — Y9389 Activity, other specified: Secondary | ICD-10-CM | POA: Insufficient documentation

## 2016-12-09 DIAGNOSIS — J45909 Unspecified asthma, uncomplicated: Secondary | ICD-10-CM | POA: Insufficient documentation

## 2016-12-09 DIAGNOSIS — S8991XA Unspecified injury of right lower leg, initial encounter: Secondary | ICD-10-CM | POA: Diagnosis present

## 2016-12-09 DIAGNOSIS — Y999 Unspecified external cause status: Secondary | ICD-10-CM | POA: Insufficient documentation

## 2016-12-09 DIAGNOSIS — I10 Essential (primary) hypertension: Secondary | ICD-10-CM | POA: Diagnosis not present
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
## 2017-01-16 ENCOUNTER — Other Ambulatory Visit: Payer: Self-pay | Admitting: Internal Medicine

## 2017-01-16 DIAGNOSIS — Z1231 Encounter for screening mammogram for malignant neoplasm of breast: Secondary | ICD-10-CM

## 2017-01-17 ENCOUNTER — Ambulatory Visit: Payer: Medicare Other | Admitting: Anesthesiology

## 2017-02-07 ENCOUNTER — Ambulatory Visit
Admission: RE | Admit: 2017-02-07 | Discharge: 2017-02-07 | Disposition: A | Discharge status: Home / Self Care | Payer: Medicare Other | Source: Ambulatory Visit | Attending: Internal Medicine | Admitting: Internal Medicine

## 2017-02-07 DIAGNOSIS — Z1231 Encounter for screening mammogram for malignant neoplasm of breast: Secondary | ICD-10-CM | POA: Insufficient documentation

## 2017-04-08 ENCOUNTER — Emergency Department: Payer: Medicare Other

## 2017-04-08 ENCOUNTER — Emergency Department
Admission: EM | Admit: 2017-04-08 | Discharge: 2017-04-08 | Disposition: A | Discharge status: Home / Self Care | Payer: Medicare Other | Attending: Emergency Medicine | Admitting: Emergency Medicine

## 2017-04-08 ENCOUNTER — Encounter: Payer: Self-pay | Admitting: Emergency Medicine

## 2017-04-08 DIAGNOSIS — Z79899 Other long term (current) drug therapy: Secondary | ICD-10-CM | POA: Diagnosis not present

## 2017-04-08 DIAGNOSIS — M545 Low back pain: Secondary | ICD-10-CM | POA: Diagnosis present

## 2017-04-08 DIAGNOSIS — F17200 Nicotine dependence, unspecified, uncomplicated: Secondary | ICD-10-CM | POA: Diagnosis not present

## 2017-04-08 DIAGNOSIS — E119 Type 2 diabetes mellitus without complications: Secondary | ICD-10-CM | POA: Diagnosis not present

## 2017-04-08 DIAGNOSIS — Z7984 Long term (current) use of oral hypoglycemic drugs: Secondary | ICD-10-CM | POA: Diagnosis not present

## 2017-04-08 DIAGNOSIS — I10 Essential (primary) hypertension: Secondary | ICD-10-CM | POA: Diagnosis not present

## 2017-04-08 DIAGNOSIS — M25552 Pain in left hip: Secondary | ICD-10-CM | POA: Diagnosis not present

## 2017-04-08 DIAGNOSIS — J45909 Unspecified asthma, uncomplicated: Secondary | ICD-10-CM | POA: Diagnosis not present

## 2017-04-08 MED ORDER — LIDOCAINE 5 % EX PTCH: 1.0000 | MEDICATED_PATCH | CUTANEOUS | Status: DC

## 2017-04-08 MED ORDER — CYCLOBENZAPRINE HCL 5 MG PO TABS: 5.0000 mg | ORAL_TABLET | Freq: Three times a day (TID) | ORAL | 0 refills | Status: AC | PRN

## 2017-04-08 NOTE — ED Provider Notes (Signed)
[facility] Emergency Department Provider Note  ____________________________________________  Time seen: Approximately 12:24 PM  I have reviewed the triage vital signs and the nursing notes.   HISTORY  Chief Complaint Fall and Back Pain    HPI [name] [name] is a 66 y.o. female that presents to the emergency department with low back pain since yesterday. Pain begins in her low back. Pain radiates into her right thigh. Weight bearing activities aggravate pain. She has been walking with a limp. She normally walks with a cane. She feels better laying down. She did not hit her head or lose consciousness. Patient has chronic back pain that radiates down her right leg, which has not changed since fall.Patient had 3 back surgeries in February. She takes oxycodone for pain. She denies SOB, CP, nausea, vomiting, abdominal pain, bowel or bladder dysfunction, saddle paresthesias, numbness, tingling, calf pain.   Past Medical History:  Diagnosis Date  . Asthma   . Diabetes mellitus without complication ([place])   . GERD (gastroesophageal reflux disease)   . Hyperlipidemia   . Hypertension     Patient Active Problem List   Diagnosis Date Noted  . Wound dehiscence 11/05/2016  . Lumbar stenosis 10/15/2016    Past Surgical History:  Procedure Laterality Date  . ABDOMINAL HYSTERECTOMY    . BACK SURGERY    . CESAREAN SECTION    . COLONOSCOPY    . DILATION AND CURETTAGE OF UTERUS    . LUMBAR LAMINECTOMY/DECOMPRESSION MICRODISCECTOMY N/A 10/15/2016   Procedure: L3-L5 Laminectomy ;  Surgeon: Deetta Perla, MD;  Location: [facility] ORS;  Service: Neurosurgery;  Laterality: N/A;  . LUMBAR WOUND DEBRIDEMENT N/A 11/05/2016   Procedure: LUMBAR WOUND DEBRIDEMENT;  Surgeon: Deetta Perla, MD;  Location: [facility] ORS;  Service: Neurosurgery;  Laterality: N/A;  . TUBAL LIGATION      Prior to Admission medications   Medication Sig Start Date End Date Taking? Authorizing Provider  acetaminophen  (TYLENOL) 650 MG CR tablet Take 650 mg by mouth daily.    [provider]  clindamycin (CLEOCIN) 300 MG capsule Take 1 capsule (300 mg total) by mouth every 8 (eight) hours. 11/06/16   Deetta Perla, MD  cyclobenzaprine (FLEXERIL) 5 MG tablet Take 1 tablet (5 mg total) by mouth 3 (three) times daily as needed for muscle spasms. 04/08/17 04/15/17  Laban Emperor, PA-C  esomeprazole (NEXIUM) 40 MG capsule Take 40 mg by mouth daily at 12 noon.    [provider]  gabapentin (NEURONTIN) 300 MG capsule Take 300 mg by mouth daily.    [provider]  levalbuterol Penne Lash) 1.25 MG/3ML nebulizer solution Inhale 1.25 mg into the lungs every 4 (four) hours as needed for wheezing or shortness of breath.  08/16/16 08/16/17  [provider]  meloxicam (MOBIC) 15 MG tablet Take 1 tablet (15 mg total) by mouth daily. Patient not taking: Reported on 10/04/2016 08/22/15   Cuthriell, Charline Bills, PA-C  metFORMIN (GLUCOPHAGE) 500 MG tablet Take 500 mg by mouth 2 (two) times daily with a meal.    [provider]  methocarbamol (ROBAXIN) 500 MG tablet Take 1 tablet (500 mg total) by mouth every 8 (eight) hours as needed for muscle spasms. 11/06/16   Deetta Perla, MD  mometasone-formoterol [facility]) 100-5 MCG/ACT AERO Inhale 2 puffs into the lungs 2 (two) times daily.    [provider]  nystatin cream (MYCOSTATIN) Apply 1 application topically 2 (two) times daily. Patient taking differently: Apply 1 application topically daily as needed  for dry skin (rash).  07/31/15   Triplett, Cari B, FNP  oxyCODONE (OXY IR/ROXICODONE) 5 MG immediate release tablet Take 1-2 tablets (5-10 mg total) by mouth every 4 (four) hours as needed for moderate pain (5 mg for 4-6/10 pain, 10 mg for 7-8/10 pain). 10/15/16   Deetta Perla, MD  oxyCODONE (ROXICODONE) 5 MG immediate release tablet Take 1 tablet (5 mg total) by mouth every 4 (four) hours as needed for severe pain. 11/08/16   Abd-El-Barr, Rogue Jury,  MD  predniSONE (DELTASONE) 10 MG tablet Take 10 mg by mouth daily.    [provider]  senna (SENOKOT) 8.6 MG TABS tablet Take 1 tablet (8.6 mg total) by mouth 2 (two) times daily. 10/16/16   Deetta Perla, MD  simvastatin (ZOCOR) 20 MG tablet Take 20 mg by mouth daily.    [provider]  triamterene-hydrochlorothiazide (DYAZIDE) 37.5-25 MG capsule Take 1 capsule by mouth daily.    [provider]    Allergies Codeine; Furosemide; Penicillin v potassium; Quinine; and Tramadol  Family History  Problem Relation Age of Onset  . Breast cancer Sister 53  . Breast cancer Cousin        maternal side 64's    Social History Social History  Substance Use Topics  . Smoking status: Former Research scientist (life sciences)  . Smokeless tobacco: Never Used  . Alcohol use No     Review of Systems  Constitutional: No fever/chills Cardiovascular: No chest pain. Respiratory: No SOB. Gastrointestinal: No abdominal pain.  No nausea, no vomiting.  Musculoskeletal: Positive for low back pain. Skin: Negative for rash, abrasions, lacerations, ecchymosis. Neurological: Negative for headaches, numbness or tingling   ____________________________________________   PHYSICAL EXAM:  VITAL SIGNS: ED Triage Vitals  Enc Vitals Group     BP 04/08/17 1153 (!) 154/75     Pulse Rate 04/08/17 1153 81     Resp 04/08/17 1153 16     Temp 04/08/17 1153 98.8 F (37.1 C)     Temp Source 04/08/17 1153 Oral     SpO2 04/08/17 1153 97 %     Weight 04/08/17 1143 213 lb (96.6 kg)     Height 04/08/17 1143 4\' 11"  (1.499 m)     Head Circumference --      Peak Flow --      Pain Score 04/08/17 1143 10     Pain Loc --      Pain Edu? --      Excl. in [place]? --      Constitutional: Alert and oriented. Well appearing and in no acute distress. Eyes: Conjunctivae are normal. PERRL. EOMI. Head: Atraumatic. ENT:      Ears:      Nose: No congestion/rhinnorhea.      Mouth/Throat: Mucous membranes are moist.  Neck: No  stridor.   Cardiovascular: Normal rate, regular rhythm.  Good peripheral circulation. 2+ dorsalis pedis pulses. Respiratory: Normal respiratory effort without tachypnea or retractions. Lungs CTAB. Good air entry to the bases with no decreased or absent breath sounds. Gastrointestinal: Bowel sounds 4 quadrants. Soft and nontender to palpation. No guarding or rigidity. No palpable masses. No distention. Musculoskeletal: Full range of motion to all extremities. No gross deformities appreciated. Tenderness to palpation over lumbar spine and over left hip. Pain with external rotation of left hip. Negative straight leg raise and cross leg raise. Neurologic:  Normal speech and language. No gross focal neurologic deficits are appreciated.  Skin:  Skin is warm, dry and intact. No rash noted.  ____________________________________________   LABS (all labs ordered are listed, but only abnormal results are displayed)  Labs Reviewed - No data to display ____________________________________________  EKG   ____________________________________________  RADIOLOGY Robinette Haines, personally viewed and evaluated these images (plain radiographs) as part of my medical decision making, as well as reviewing the written report by the radiologist.  Dg Lumbar Spine Complete  Result Date: 04/08/2017 CLINICAL DATA:  It and fell backwards yesterday landing on the buttocks. The patient reports mid bilateral low back pain. History of 3 previous lumbar surgeries most recently in January 2018. EXAM: LUMBAR SPINE - COMPLETE 4+ VIEW COMPARISON:  MRI of the lumbar spine of January 30, 2016. FINDINGS: The lumbar vertebral bodies are preserved in height. There is disc space narrowing at L4-5 and L5-S1. There is chronic loss of height along the anterior superior endplate of L4. There is no spondylolisthesis. There is mild facet joint hypertrophy at L5-S1 and to a lesser extent at L4-5. The pedicles appear intact. The patient  has undergone laminectomy at L4 and L5. IMPRESSION: No definite acute bony abnormality of the lumbar spine. 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 If the patient's symptoms warrant further evaluation, MRI would be a useful next imaging step and allow direct comparison to the study from April 2017. Electronically Signed   By: David  Martinique M.D.   On: 04/08/2017 13:31   Dg Hip Unilat W Or Wo Pelvis 2-3 Views Left  Result Date: 04/08/2017 CLINICAL DATA:  Fall. EXAM: DG HIP (WITH OR WITHOUT PELVIS) 2-3V LEFT COMPARISON:  05/01/2015. FINDINGS: Degenerative changes lumbar spine and both hips. No acute bony or joint abnormality identified. No evidence of fracture or dislocation. Pelvic calcifications consistent phleboliths. Aortoiliac atherosclerotic vascular disease. IMPRESSION: 1.  No acute bony or joint abnormality identified. 2. Aortoiliac atherosclerotic vascular disease. Electronically Signed   By: Marcello Moores  Register   On: 04/08/2017 13:29    ____________________________________________    PROCEDURES  Procedure(s) performed:    Procedures    Medications  lidocaine (LIDODERM) 5 % 1 patch (not administered)     ____________________________________________   INITIAL IMPRESSION / ASSESSMENT AND PLAN / ED COURSE  Pertinent labs & imaging results that were available during my care of the patient were reviewed by me and considered in my medical decision making (see chart for details).  Review of the [place] CSRS was performed in accordance of the [place] prior to dispensing any controlled drugs.   Patient presented to the emergency room with low back pain and left hip pain for one day. Vital signs and exam are reassuring. No indication of acute bony abnormalities on lumbar or hip x-ray. X-ray consistent with stable degenerative disc disease and stable compression fracture since 2017. Findings were discussed with patient. Patient will be  discharged home with prescriptions for Flexeril. Patient is to follow up with PCP as directed. Patient is given ED precautions to return to the ED for any worsening or new symptoms.     ____________________________________________  FINAL CLINICAL IMPRESSION(S) / ED DIAGNOSES  Final diagnoses:  Pain of left hip joint      NEW MEDICATIONS STARTED DURING THIS VISIT:  Discharge Medication List as of 04/08/2017  2:08 PM    START taking these medications   Details  cyclobenzaprine (FLEXERIL) 5 MG tablet Take 1 tablet (5 mg total) by mouth 3 (three) times daily as needed for muscle spasms., Starting Mon 04/08/2017, Until Mon 04/15/2017, Print  This chart was dictated using voice recognition software/Dragon. Despite best efforts to proofread, errors can occur which can change the meaning. Any change was purely unintentional.    Laban Emperor, PA-C 04/08/17 1525    Darel Hong, MD 04/10/17 [phone]

## 2017-04-08 NOTE — ED Notes (Signed)
See triage note  States she tripped and fell  Landed on buttocks  Having pain to across lower back  Ambulates well to treatment room

## 2017-04-08 NOTE — ED Triage Notes (Signed)
Tripped and fell back yesterday landing on bottom.  Says pain in low back bilaterally.  Did not hit her head.

## 2017-05-12 ENCOUNTER — Emergency Department
Admission: EM | Admit: 2017-05-12 | Discharge: 2017-05-12 | Disposition: A | Discharge status: Home / Self Care | Payer: Medicare Other | Attending: Emergency Medicine | Admitting: Emergency Medicine

## 2017-05-12 ENCOUNTER — Encounter: Payer: Self-pay | Admitting: Emergency Medicine

## 2017-05-12 DIAGNOSIS — Z87891 Personal history of nicotine dependence: Secondary | ICD-10-CM | POA: Diagnosis not present

## 2017-05-12 DIAGNOSIS — I10 Essential (primary) hypertension: Secondary | ICD-10-CM | POA: Insufficient documentation

## 2017-05-12 DIAGNOSIS — M542 Cervicalgia: Secondary | ICD-10-CM | POA: Diagnosis present

## 2017-05-12 DIAGNOSIS — Z79899 Other long term (current) drug therapy: Secondary | ICD-10-CM | POA: Insufficient documentation

## 2017-05-12 DIAGNOSIS — Z7984 Long term (current) use of oral hypoglycemic drugs: Secondary | ICD-10-CM | POA: Diagnosis not present

## 2017-05-12 DIAGNOSIS — J45909 Unspecified asthma, uncomplicated: Secondary | ICD-10-CM | POA: Diagnosis not present

## 2017-05-12 DIAGNOSIS — E119 Type 2 diabetes mellitus without complications: Secondary | ICD-10-CM | POA: Diagnosis not present

## 2017-05-12 DIAGNOSIS — M5410 Radiculopathy, site unspecified: Secondary | ICD-10-CM

## 2017-05-12 DIAGNOSIS — M5412 Radiculopathy, cervical region: Secondary | ICD-10-CM | POA: Diagnosis not present

## 2017-05-12 LAB — GLUCOSE, CAPILLARY: GLUCOSE-CAPILLARY: 112 mg/dL — AB (ref 65–99)

## 2017-05-12 MED ORDER — CYCLOBENZAPRINE HCL 10 MG PO TABS
5.0000 mg | ORAL_TABLET | Freq: Once | ORAL | Status: AC
  Administered 2017-05-12: 5 mg via ORAL
  Filled 2017-05-12: qty 1

## 2017-05-12 MED ORDER — KETOROLAC TROMETHAMINE 60 MG/2ML IM SOLN
30.0000 mg | Freq: Once | INTRAMUSCULAR | Status: AC
  Administered 2017-05-12: 30 mg via INTRAMUSCULAR
  Filled 2017-05-12: qty 2

## 2017-05-12 MED ORDER — PREDNISONE 10 MG (21) PO TBPK: ORAL_TABLET | ORAL | 0 refills | Status: DC

## 2017-05-12 MED ORDER — METHYLPREDNISOLONE SODIUM SUCC 125 MG IJ SOLR
80.0000 mg | Freq: Once | INTRAMUSCULAR | Status: AC
  Administered 2017-05-12: 80 mg via INTRAMUSCULAR
  Filled 2017-05-12: qty 2

## 2017-05-12 MED ORDER — LIDOCAINE 5 % EX PTCH: 1.0000 | MEDICATED_PATCH | CUTANEOUS | 0 refills | Status: DC

## 2017-05-12 MED ORDER — LIDOCAINE 5 % EX PTCH
1.0000 | MEDICATED_PATCH | CUTANEOUS | Status: DC
  Administered 2017-05-12: 1 via TRANSDERMAL
  Filled 2017-05-12: qty 1

## 2017-05-12 MED ORDER — IBUPROFEN 600 MG PO TABS: 600.0000 mg | ORAL_TABLET | Freq: Four times a day (QID) | ORAL | 0 refills | Status: DC | PRN

## 2017-05-12 MED ORDER — CYCLOBENZAPRINE HCL 5 MG PO TABS: 5.0000 mg | ORAL_TABLET | Freq: Every day | ORAL | 0 refills | Status: AC

## 2017-05-12 NOTE — ED Notes (Signed)
ED Provider at bedside. 

## 2017-05-12 NOTE — ED Triage Notes (Signed)
Patient presents to the ED with pain radiating from her neck down her right arm x 3 days.  Patient denies chest pain and shortness of breath.  Patient denies numbness to area.  Patient denies injury.  Patient is in no obvious distress at this time.  Patient reports she has tried massage and heat without relief.

## 2017-05-12 NOTE — ED Notes (Signed)
Pt requesting to speak to the provider Laqueta Carina aware

## 2017-05-12 NOTE — ED Provider Notes (Signed)
[facility] Emergency Department Provider Note  ____________________________________________  Time seen: Approximately 8:48 AM  I have reviewed the triage vital signs and the nursing notes.   HISTORY  Chief Complaint Arm Pain and Neck Pain    HPI [name] [name] is a 66 y.o. female that presents to the emergency department with pain that begins in neck and radiates down right arm for 3 days. Pain is sharp in character. No trauma. This has never happened before. She has been using warm compresses and having her granddaughter massage her shoulder and neck, without relief. She denies shortness of breath, chest pain, nausea, vomiting, abdominal pain, numbness, tingling.   Past Medical History:  Diagnosis Date  . Asthma   . Diabetes mellitus without complication ([place])   . GERD (gastroesophageal reflux disease)   . Hyperlipidemia   . Hypertension     Patient Active Problem List   Diagnosis Date Noted  . Wound dehiscence 11/05/2016  . Lumbar stenosis 10/15/2016    Past Surgical History:  Procedure Laterality Date  . ABDOMINAL HYSTERECTOMY    . BACK SURGERY    . CESAREAN SECTION    . COLONOSCOPY    . DILATION AND CURETTAGE OF UTERUS    . LUMBAR LAMINECTOMY/DECOMPRESSION MICRODISCECTOMY N/A 10/15/2016   Procedure: L3-L5 Laminectomy ;  Surgeon: Deetta Perla, MD;  Location: [facility] ORS;  Service: Neurosurgery;  Laterality: N/A;  . LUMBAR WOUND DEBRIDEMENT N/A 11/05/2016   Procedure: LUMBAR WOUND DEBRIDEMENT;  Surgeon: Deetta Perla, MD;  Location: [facility] ORS;  Service: Neurosurgery;  Laterality: N/A;  . TUBAL LIGATION      Prior to Admission medications   Medication Sig Start Date End Date Taking? Authorizing Provider  acetaminophen (TYLENOL) 650 MG CR tablet Take 650 mg by mouth daily.    [provider]  clindamycin (CLEOCIN) 300 MG capsule Take 1 capsule (300 mg total) by mouth every 8 (eight) hours. 11/06/16   Deetta Perla, MD  cyclobenzaprine  (FLEXERIL) 5 MG tablet Take 1 tablet (5 mg total) by mouth at bedtime. 05/12/17 05/19/17  Laban Emperor, PA-C  esomeprazole (NEXIUM) 40 MG capsule Take 40 mg by mouth daily at 12 noon.    [provider]  gabapentin (NEURONTIN) 300 MG capsule Take 300 mg by mouth daily.    [provider]  levalbuterol Penne Lash) 1.25 MG/3ML nebulizer solution Inhale 1.25 mg into the lungs every 4 (four) hours as needed for wheezing or shortness of breath.  08/16/16 08/16/17  [provider]  lidocaine (LIDODERM) 5 % Place 1 patch onto the skin daily. Remove & Discard patch within 12 hours or as directed by MD 05/12/17   Laban Emperor, PA-C  meloxicam (MOBIC) 15 MG tablet Take 1 tablet (15 mg total) by mouth daily. Patient not taking: Reported on 10/04/2016 08/22/15   Cuthriell, Charline Bills, PA-C  metFORMIN (GLUCOPHAGE) 500 MG tablet Take 500 mg by mouth 2 (two) times daily with a meal.    [provider]  methocarbamol (ROBAXIN) 500 MG tablet Take 1 tablet (500 mg total) by mouth every 8 (eight) hours as needed for muscle spasms. 11/06/16   Deetta Perla, MD  mometasone-formoterol [facility]) 100-5 MCG/ACT AERO Inhale 2 puffs into the lungs 2 (two) times daily.    [provider]  nystatin cream (MYCOSTATIN) Apply 1 application topically 2 (two) times daily. Patient taking differently: Apply 1 application topically daily as needed for dry skin (rash).  07/31/15   Triplett, Johnette Abraham B, FNP  oxyCODONE (OXY IR/ROXICODONE)  5 MG immediate release tablet Take 1-2 tablets (5-10 mg total) by mouth every 4 (four) hours as needed for moderate pain (5 mg for 4-6/10 pain, 10 mg for 7-8/10 pain). 10/15/16   Deetta Perla, MD  oxyCODONE (ROXICODONE) 5 MG immediate release tablet Take 1 tablet (5 mg total) by mouth every 4 (four) hours as needed for severe pain. 11/08/16   Abd-El-Barr, Rogue Jury, MD  predniSONE (STERAPRED UNI-PAK 21 TAB) 10 MG (21) TBPK tablet Take 6 tablets on day 1, take 5 tablets on day 2,  take 4 tablets on day 3, take 3 tablets on day 4, take 2 tablets on day 5, take 1 tablet on day 6 05/12/17   Laban Emperor, PA-C  senna (SENOKOT) 8.6 MG TABS tablet Take 1 tablet (8.6 mg total) by mouth 2 (two) times daily. 10/16/16   Deetta Perla, MD  simvastatin (ZOCOR) 20 MG tablet Take 20 mg by mouth daily.    [provider]  triamterene-hydrochlorothiazide (DYAZIDE) 37.5-25 MG capsule Take 1 capsule by mouth daily.    [provider]    Allergies Codeine; Furosemide; Penicillin v potassium; Quinine; and Tramadol  Family History  Problem Relation Age of Onset  . Breast cancer Sister 25  . Breast cancer Cousin        maternal side 8's    Social History Social History  Substance Use Topics  . Smoking status: Former Research scientist (life sciences)  . Smokeless tobacco: Never Used  . Alcohol use No     Review of Systems  Constitutional: No fever/chills Cardiovascular: No chest pain. Respiratory:  No SOB. Gastrointestinal: No abdominal pain.  No nausea, no vomiting.  Musculoskeletal: Positive for right arm pain. Skin: Negative for rash, abrasions, lacerations, ecchymosis. Neurological: Negative for headaches, numbness or tingling   ____________________________________________   PHYSICAL EXAM:  VITAL SIGNS: ED Triage Vitals  Enc Vitals Group     BP 05/12/17 0754 (!) 141/78     Pulse Rate 05/12/17 0754 76     Resp 05/12/17 0754 20     Temp 05/12/17 0754 97.7 F (36.5 C)     Temp Source 05/12/17 0754 Oral     SpO2 05/12/17 0754 100 %     Weight 05/12/17 0755 215 lb (97.5 kg)     Height 05/12/17 0755 4\' 11"  (1.499 m)     Head Circumference --      Peak Flow --      Pain Score 05/12/17 0807 10     Pain Loc --      Pain Edu? --      Excl. in [place]? --      Constitutional: Alert and oriented. Well appearing and in no acute distress. Eyes: Conjunctivae are normal. PERRL. EOMI. Head: Atraumatic. ENT:      Ears:      Nose: No congestion/rhinnorhea.      Mouth/Throat:  Mucous membranes are moist.  Neck: No stridor.  Cervical spine tenderness to palpation at all levels. Tenderness to palpation over right trapezius muscle. Cardiovascular: Normal rate, regular rhythm.  Good peripheral circulation. 2+ radial pulses. Respiratory: Normal respiratory effort without tachypnea or retractions. Lungs CTAB. Good air entry to the bases with no decreased or absent breath sounds. Gastrointestinal: Bowel sounds 4 quadrants. Soft and nontender to palpation. No guarding or rigidity. No palpable masses. No distention.  Musculoskeletal: Full range of motion to all extremities. No gross deformities appreciated. Tenderness to palpation over right shoulder, right elbow, right wrist. Neurologic:  Normal speech and language. No  gross focal neurologic deficits are appreciated.  Skin:  Skin is warm, dry and intact. No rash noted.  ____________________________________________   LABS (all labs ordered are listed, but only abnormal results are displayed)  Labs Reviewed  GLUCOSE, CAPILLARY - Abnormal; Notable for the following:       Result Value   Glucose-Capillary 112 (*)    All other components within normal limits   ____________________________________________  EKG   ____________________________________________  RADIOLOGY  No results found.  ____________________________________________    PROCEDURES  Procedure(s) performed:    Procedures    Medications  methylPREDNISolone sodium succinate (SOLU-MEDROL) 125 mg/2 mL injection 80 mg (80 mg Intramuscular Given 05/12/17 0951)  ketorolac (TORADOL) injection 30 mg (30 mg Intramuscular Given 05/12/17 0953)  cyclobenzaprine (FLEXERIL) tablet 5 mg (5 mg Oral Given 05/12/17 0956)     ____________________________________________   INITIAL IMPRESSION / ASSESSMENT AND PLAN / ED COURSE  Pertinent labs & imaging results that were available during my care of the patient were reviewed by me and considered in my medical  decision making (see chart for details).  Review of the [place] CSRS was performed in accordance of the [place] prior to dispensing any controlled drugs.  Presented to the emergency department for evaluation of right arm pain. It is consistent with radicular pain. Vital signs and exam are reassuring. Patient was given Toradol, solumedrol, and flexeril in ED.  Patient will be discharged home with prescriptions for flexeril and prednisone. Patient is to follow up with PCP as directed. Patient is given ED precautions to return to the ED for any worsening or new symptoms.   ____________________________________________  FINAL CLINICAL IMPRESSION(S) / ED DIAGNOSES  Final diagnoses:  Radiculopathy, unspecified spinal region      NEW MEDICATIONS STARTED DURING THIS VISIT:  Discharge Medication List as of 05/12/2017  9:58 AM    START taking these medications   Details  ibuprofen (ADVIL,MOTRIN) 600 MG tablet Take 1 tablet (600 mg total) by mouth every 6 (six) hours as needed., Starting Sun 05/12/2017, Print    predniSONE (STERAPRED UNI-PAK 21 TAB) 10 MG (21) TBPK tablet Take 6 tablets on day 1, take 5 tablets on day 2, take 4 tablets on day 3, take 3 tablets on day 4, take 2 tablets on day 5, take 1 tablet on day 6, Print            This chart was dictated using voice recognition software/Dragon. Despite best efforts to proofread, errors can occur which can change the meaning. Any change was purely unintentional.    Laban Emperor, PA-C 05/12/17 1554    Burlene Arnt Gerda Diss, MD 05/13/17 [phone]

## 2017-05-20 DIAGNOSIS — N183 Chronic kidney disease, stage 3 unspecified: Secondary | ICD-10-CM | POA: Insufficient documentation

## 2017-06-17 DIAGNOSIS — I10 Essential (primary) hypertension: Secondary | ICD-10-CM | POA: Insufficient documentation

## 2017-06-17 DIAGNOSIS — I351 Nonrheumatic aortic (valve) insufficiency: Secondary | ICD-10-CM | POA: Insufficient documentation

## 2017-10-07 DIAGNOSIS — I359 Nonrheumatic aortic valve disorder, unspecified: Secondary | ICD-10-CM | POA: Insufficient documentation

## 2017-11-13 ENCOUNTER — Other Ambulatory Visit: Payer: Self-pay | Admitting: Internal Medicine

## 2017-11-13 DIAGNOSIS — R140 Abdominal distension (gaseous): Secondary | ICD-10-CM

## 2017-11-13 DIAGNOSIS — I10 Essential (primary) hypertension: Secondary | ICD-10-CM

## 2017-11-13 DIAGNOSIS — D1722 Benign lipomatous neoplasm of skin and subcutaneous tissue of left arm: Secondary | ICD-10-CM

## 2017-12-02 ENCOUNTER — Other Ambulatory Visit: Payer: Self-pay | Admitting: Internal Medicine

## 2017-12-02 ENCOUNTER — Ambulatory Visit: Admission: RE | Admit: 2017-12-02 | Payer: Medicare Other | Source: Ambulatory Visit

## 2017-12-02 DIAGNOSIS — Z1231 Encounter for screening mammogram for malignant neoplasm of breast: Secondary | ICD-10-CM

## 2017-12-16 ENCOUNTER — Ambulatory Visit: Payer: Medicare Other

## 2018-02-12 ENCOUNTER — Ambulatory Visit
Admission: RE | Admit: 2018-02-12 | Discharge: 2018-02-12 | Disposition: A | Discharge status: Home / Self Care | Payer: Medicare Other | Source: Ambulatory Visit | Attending: Internal Medicine | Admitting: Internal Medicine

## 2018-02-12 DIAGNOSIS — Z1231 Encounter for screening mammogram for malignant neoplasm of breast: Secondary | ICD-10-CM | POA: Insufficient documentation

## 2018-03-18 ENCOUNTER — Ambulatory Visit: Admission: RE | Admit: 2018-03-18 | Payer: Medicare Other | Source: Ambulatory Visit

## 2018-03-25 ENCOUNTER — Ambulatory Visit: Admission: RE | Admit: 2018-03-25 | Payer: Medicare Other | Source: Ambulatory Visit

## 2018-04-28 ENCOUNTER — Ambulatory Visit: Admission: RE | Admit: 2018-04-28 | Payer: Medicare Other | Source: Ambulatory Visit

## 2018-04-28 ENCOUNTER — Inpatient Hospital Stay: Admission: RE | Admit: 2018-04-28 | Payer: Medicare Other | Source: Ambulatory Visit

## 2018-05-05 ENCOUNTER — Ambulatory Visit
Admission: RE | Admit: 2018-05-05 | Discharge: 2018-05-05 | Disposition: A | Discharge status: Home / Self Care | Payer: Medicare Other | Source: Ambulatory Visit | Attending: Internal Medicine | Admitting: Internal Medicine

## 2018-05-05 DIAGNOSIS — N281 Cyst of kidney, acquired: Secondary | ICD-10-CM | POA: Insufficient documentation

## 2018-05-05 DIAGNOSIS — I2510 Atherosclerotic heart disease of native coronary artery without angina pectoris: Secondary | ICD-10-CM | POA: Insufficient documentation

## 2018-05-05 DIAGNOSIS — I10 Essential (primary) hypertension: Secondary | ICD-10-CM

## 2018-05-05 DIAGNOSIS — D1722 Benign lipomatous neoplasm of skin and subcutaneous tissue of left arm: Secondary | ICD-10-CM | POA: Diagnosis present

## 2018-05-05 DIAGNOSIS — M47894 Other spondylosis, thoracic region: Secondary | ICD-10-CM | POA: Insufficient documentation

## 2018-05-05 DIAGNOSIS — K439 Ventral hernia without obstruction or gangrene: Secondary | ICD-10-CM | POA: Diagnosis not present

## 2018-05-05 DIAGNOSIS — K5730 Diverticulosis of large intestine without perforation or abscess without bleeding: Secondary | ICD-10-CM | POA: Diagnosis not present

## 2018-05-05 DIAGNOSIS — I700 Atherosclerosis of aorta: Secondary | ICD-10-CM | POA: Diagnosis not present

## 2018-05-05 DIAGNOSIS — R140 Abdominal distension (gaseous): Secondary | ICD-10-CM | POA: Insufficient documentation

## 2018-05-05 DIAGNOSIS — M419 Scoliosis, unspecified: Secondary | ICD-10-CM | POA: Insufficient documentation

## 2018-05-05 DIAGNOSIS — M47896 Other spondylosis, lumbar region: Secondary | ICD-10-CM | POA: Diagnosis not present

## 2018-05-05 DIAGNOSIS — K429 Umbilical hernia without obstruction or gangrene: Secondary | ICD-10-CM | POA: Diagnosis not present

## 2018-05-05 DIAGNOSIS — Z90710 Acquired absence of both cervix and uterus: Secondary | ICD-10-CM | POA: Diagnosis not present

## 2018-05-19 ENCOUNTER — Other Ambulatory Visit: Payer: Self-pay | Admitting: General Surgery

## 2018-05-19 DIAGNOSIS — M85621 Other cyst of bone, right upper arm: Secondary | ICD-10-CM

## 2018-05-19 DIAGNOSIS — M71329 Other bursal cyst, unspecified elbow: Secondary | ICD-10-CM

## 2018-05-20 ENCOUNTER — Other Ambulatory Visit: Payer: Self-pay | Admitting: General Surgery

## 2018-05-20 DIAGNOSIS — M7130 Other bursal cyst, unspecified site: Secondary | ICD-10-CM

## 2018-05-23 ENCOUNTER — Ambulatory Visit: Payer: Medicare Other

## 2018-05-26 ENCOUNTER — Ambulatory Visit
Admission: RE | Admit: 2018-05-26 | Discharge: 2018-05-26 | Disposition: A | Discharge status: Home / Self Care | Payer: Medicare Other | Source: Ambulatory Visit | Attending: General Surgery | Admitting: General Surgery

## 2018-05-26 DIAGNOSIS — M71321 Other bursal cyst, right elbow: Secondary | ICD-10-CM | POA: Insufficient documentation

## 2018-05-26 DIAGNOSIS — M7130 Other bursal cyst, unspecified site: Secondary | ICD-10-CM

## 2018-06-03 ENCOUNTER — Other Ambulatory Visit: Payer: Self-pay | Admitting: Internal Medicine

## 2018-06-03 ENCOUNTER — Ambulatory Visit: Payer: Medicare Other

## 2018-06-03 DIAGNOSIS — R609 Edema, unspecified: Secondary | ICD-10-CM

## 2018-06-03 DIAGNOSIS — M79604 Pain in right leg: Secondary | ICD-10-CM

## 2018-06-03 DIAGNOSIS — R0789 Other chest pain: Secondary | ICD-10-CM

## 2018-06-04 ENCOUNTER — Ambulatory Visit: Admission: RE | Admit: 2018-06-04 | Payer: Medicare Other | Source: Ambulatory Visit

## 2018-06-10 ENCOUNTER — Ambulatory Visit: Payer: Medicare Other

## 2018-06-11 ENCOUNTER — Ambulatory Visit: Admission: RE | Admit: 2018-06-11 | Payer: Medicare Other | Source: Ambulatory Visit

## 2018-06-16 ENCOUNTER — Other Ambulatory Visit: Payer: Medicare Other

## 2018-06-16 ENCOUNTER — Ambulatory Visit
Admission: RE | Admit: 2018-06-16 | Discharge: 2018-06-16 | Disposition: A | Discharge status: Home / Self Care | Payer: Medicare Other | Source: Ambulatory Visit | Attending: Internal Medicine | Admitting: Internal Medicine

## 2018-06-16 DIAGNOSIS — K861 Other chronic pancreatitis: Secondary | ICD-10-CM | POA: Diagnosis not present

## 2018-06-16 DIAGNOSIS — I288 Other diseases of pulmonary vessels: Secondary | ICD-10-CM | POA: Insufficient documentation

## 2018-06-16 DIAGNOSIS — R0789 Other chest pain: Secondary | ICD-10-CM | POA: Diagnosis not present

## 2018-06-16 DIAGNOSIS — M79604 Pain in right leg: Secondary | ICD-10-CM

## 2018-06-16 DIAGNOSIS — I2510 Atherosclerotic heart disease of native coronary artery without angina pectoris: Secondary | ICD-10-CM | POA: Insufficient documentation

## 2018-06-16 DIAGNOSIS — R609 Edema, unspecified: Secondary | ICD-10-CM

## 2018-06-16 DIAGNOSIS — I700 Atherosclerosis of aorta: Secondary | ICD-10-CM | POA: Insufficient documentation

## 2018-06-16 DIAGNOSIS — X58XXXD Exposure to other specified factors, subsequent encounter: Secondary | ICD-10-CM | POA: Diagnosis not present

## 2018-06-16 DIAGNOSIS — S2241XD Multiple fractures of ribs, right side, subsequent encounter for fracture with routine healing: Secondary | ICD-10-CM | POA: Insufficient documentation

## 2018-06-16 LAB — POCT I-STAT CREATININE: CREATININE: 1.2 mg/dL — AB (ref 0.44–1.00)

## 2018-06-16 MED ORDER — IOPAMIDOL (ISOVUE-300) INJECTION 61%
75.0000 mL | Freq: Once | INTRAVENOUS | Status: AC | PRN
  Administered 2018-06-16: 75 mL via INTRAVENOUS

## 2018-07-04 ENCOUNTER — Encounter: Payer: Self-pay | Admitting: *Deleted

## 2018-07-07 ENCOUNTER — Ambulatory Visit
Admission: RE | Admit: 2018-07-07 | Payer: Medicare Other | Source: Ambulatory Visit | Admitting: Unknown Physician Specialty

## 2018-07-07 ENCOUNTER — Encounter: Admission: RE | Payer: Self-pay | Source: Ambulatory Visit

## 2018-07-07 HISTORY — DX: Chronic obstructive pulmonary disease, unspecified: J44.9

## 2018-07-07 HISTORY — DX: Sleep apnea, unspecified: G47.30

## 2018-07-07 HISTORY — DX: Plantar fascial fibromatosis: M72.2

## 2018-07-07 HISTORY — DX: Personal history of adenomatous and serrated colon polyps: Z86.0101

## 2018-07-07 HISTORY — DX: Morbid (severe) obesity due to excess calories: E66.01

## 2018-07-07 HISTORY — DX: Personal history of colonic polyps: Z86.010

## 2018-07-07 HISTORY — DX: Transient cerebral ischemic attack, unspecified: G45.9

## 2018-07-07 HISTORY — DX: Age-related osteoporosis without current pathological fracture: M81.0

## 2018-07-07 HISTORY — DX: Personal history of other medical treatment: Z92.89

## 2018-07-07 HISTORY — DX: Anemia, unspecified: D64.9

## 2018-07-07 HISTORY — DX: Pulmonary fibrosis, unspecified: J84.10

## 2018-07-07 HISTORY — DX: Hereditary and idiopathic neuropathy, unspecified: G60.9

## 2018-07-07 SURGERY — COLONOSCOPY WITH PROPOFOL
Anesthesia: General

## 2018-09-15 DIAGNOSIS — G894 Chronic pain syndrome: Secondary | ICD-10-CM | POA: Insufficient documentation

## 2018-09-19 ENCOUNTER — Encounter: Payer: Self-pay | Admitting: *Deleted

## 2018-11-19 ENCOUNTER — Ambulatory Visit
Admission: RE | Admit: 2018-11-19 | Payer: Medicare Other | Source: Home / Self Care | Admitting: Unknown Physician Specialty

## 2018-11-19 ENCOUNTER — Encounter: Admission: RE | Payer: Self-pay | Source: Home / Self Care

## 2018-11-19 SURGERY — COLONOSCOPY WITH PROPOFOL
Anesthesia: General

## 2019-01-05 ENCOUNTER — Other Ambulatory Visit: Payer: Self-pay | Admitting: Internal Medicine

## 2019-01-05 DIAGNOSIS — Z1231 Encounter for screening mammogram for malignant neoplasm of breast: Secondary | ICD-10-CM

## 2019-02-23 ENCOUNTER — Ambulatory Visit
Admission: RE | Admit: 2019-02-23 | Discharge: 2019-02-23 | Disposition: A | Discharge status: Home / Self Care | Payer: Medicare Other | Source: Ambulatory Visit | Attending: Internal Medicine | Admitting: Internal Medicine

## 2019-02-23 ENCOUNTER — Other Ambulatory Visit: Payer: Self-pay

## 2019-02-23 DIAGNOSIS — Z1231 Encounter for screening mammogram for malignant neoplasm of breast: Secondary | ICD-10-CM

## 2019-10-19 ENCOUNTER — Encounter: Payer: Self-pay | Admitting: Podiatry

## 2019-10-19 ENCOUNTER — Other Ambulatory Visit: Payer: Self-pay

## 2019-10-19 ENCOUNTER — Ambulatory Visit (INDEPENDENT_AMBULATORY_CARE_PROVIDER_SITE_OTHER): Payer: Medicare Other

## 2019-10-19 ENCOUNTER — Ambulatory Visit (INDEPENDENT_AMBULATORY_CARE_PROVIDER_SITE_OTHER): Payer: Medicare Other | Admitting: Podiatry

## 2019-10-19 DIAGNOSIS — L97511 Non-pressure chronic ulcer of other part of right foot limited to breakdown of skin: Secondary | ICD-10-CM

## 2019-10-19 DIAGNOSIS — E119 Type 2 diabetes mellitus without complications: Secondary | ICD-10-CM

## 2019-10-19 DIAGNOSIS — M2011 Hallux valgus (acquired), right foot: Secondary | ICD-10-CM

## 2019-10-19 DIAGNOSIS — Z794 Long term (current) use of insulin: Secondary | ICD-10-CM | POA: Diagnosis not present

## 2019-10-19 NOTE — Progress Notes (Signed)
Subjective:  Patient ID: [name] [name], female    DOB: 1951-01-18,  MRN: [id number]  Chief Complaint  Patient presents with  . Foot Pain    69 y.o. female presents for wound care.  Patient presents with right submetatarsal 1 pain.  Patient states that she may have stepped on something that is causing a lot of pain.  She states she is a diabetic with unknown sugar levels.  She states that they are well controlled however she does not know her last A1c.  She states that she may have stepped on a glass/nail.  She does not know if she did or not.  She states that she walks barefooted.  She denies any other acute complaints.  She denies any drainage coming out of it.  She denies any clinical signs of infection.   Review of Systems: Negative except as noted in the HPI. Denies N/V/F/Ch.  Past Medical History:  Diagnosis Date  . Anemia   . Asthma   . Chronic airway obstruction ([place])   . Diabetes mellitus without complication ([place])   . GERD (gastroesophageal reflux disease)   . History of adenomatous polyp of colon   . History of bone density study   . Hyperlipidemia   . Hypertension   . Idiopathic peripheral neuropathy   . Morbid obesity ([place])   . Osteoporosis   . Plantar fascial fibromatosis   . Pulmonary fibrosis ([place])   . Sleep apnea    Hypersomnia with sleep apnea  . TIA (transient ischemic attack)     Current Outpatient Medications:  .  acetaminophen (TYLENOL) 650 MG CR tablet, Take 650 mg by mouth daily., Disp: , Rfl:  .  albuterol (VENTOLIN HFA) 108 (90 Base) MCG/ACT inhaler, SMARTSIG:2 Puff(s) By Mouth Every 6 Hours PRN, Disp: , Rfl:  .  allopurinol (ZYLOPRIM) 100 MG tablet, Take 100 mg by mouth daily., Disp: , Rfl:  .  budesonide-formoterol (SYMBICORT) 160-4.5 MCG/ACT inhaler, Inhale 2 puffs into the lungs 2 (two) times daily., Disp: , Rfl:  .  clotrimazole-betamethasone (LOTRISONE) cream, Apply 1 application topically 2 (two) times daily., Disp: , Rfl:  .  cyclobenzaprine  (FLEXERIL) 5 MG tablet, Take 5 mg by mouth 2 (two) times daily as needed for muscle spasms., Disp: , Rfl:  .  DULoxetine (CYMBALTA) 30 MG capsule, Take 30 mg by mouth daily., Disp: , Rfl:  .  esomeprazole (NEXIUM) 40 MG capsule, Take 40 mg by mouth daily at 12 noon., Disp: , Rfl:  .  gabapentin (NEURONTIN) 300 MG capsule, Take 300 mg by mouth daily., Disp: , Rfl:  .  glimepiride (AMARYL) 2 MG tablet, Take 2 mg by mouth every morning., Disp: , Rfl:  .  hydrochlorothiazide (MICROZIDE) 12.5 MG capsule, Take by mouth., Disp: , Rfl:  .  levalbuterol (XOPENEX) 1.25 MG/3ML nebulizer solution, Inhale 1.25 mg into the lungs every 4 (four) hours as needed for wheezing or shortness of breath. , Disp: , Rfl:  .  losartan (COZAAR) 50 MG tablet, Take 50 mg by mouth daily., Disp: , Rfl:  .  metFORMIN (GLUCOPHAGE) 500 MG tablet, Take 500 mg by mouth 2 (two) times daily with a meal., Disp: , Rfl:  .  methocarbamol (ROBAXIN) 500 MG tablet, Take 1 tablet (500 mg total) by mouth every 8 (eight) hours as needed for muscle spasms., Disp: 60 tablet, Rfl: 1 .  mometasone-formoterol (DULERA) 100-5 MCG/ACT AERO, Inhale 2 puffs into the lungs 2 (two) times daily., Disp: , Rfl:  .  nystatin cream (MYCOSTATIN), Apply 1 application topically 2 (two) times daily. (Patient taking differently: Apply 1 application topically daily as needed for dry skin (rash). ), Disp: 30 g, Rfl: 0 .  oxyCODONE (OXY IR/ROXICODONE) 5 MG immediate release tablet, Take 1-2 tablets (5-10 mg total) by mouth every 4 (four) hours as needed for moderate pain (5 mg for 4-6/10 pain, 10 mg for 7-8/10 pain)., Disp: 80 tablet, Rfl: 0 .  oxyCODONE (ROXICODONE) 5 MG immediate release tablet, Take 1 tablet (5 mg total) by mouth every 4 (four) hours as needed for severe pain., Disp: 30 tablet, Rfl: 0 .  predniSONE (STERAPRED UNI-PAK 21 TAB) 10 MG (21) TBPK tablet, Take 6 tablets on day 1, take 5 tablets on day 2, take 4 tablets on day 3, take 3 tablets on day 4, take 2  tablets on day 5, take 1 tablet on day 6, Disp: 21 tablet, Rfl: 0 .  senna (SENOKOT) 8.6 MG TABS tablet, Take 1 tablet (8.6 mg total) by mouth 2 (two) times daily., Disp: 120 each, Rfl: 0 .  simvastatin (ZOCOR) 20 MG tablet, Take 20 mg by mouth daily., Disp: , Rfl:  .  triamterene-hydrochlorothiazide (DYAZIDE) 37.5-25 MG capsule, Take 1 capsule by mouth daily., Disp: , Rfl:   Social History   Tobacco Use  Smoking Status Former Smoker  Smokeless Tobacco Never Used    Allergies  Allergen Reactions  . Acetaminophen-Codeine Other (See Comments)    Other reaction(s): Unknown   . Codeine Nausea And Vomiting  . Furosemide Other (See Comments)  . Penicillin V Potassium     Other reaction(s): Unknown  . Quinine     Other reaction(s): Unknown  . Tramadol     Other reaction(s): Unknown  . Tylenol [Acetaminophen]     Tylenol-Codeine: Dizziness   Objective:  There were no vitals filed for this visit. There is no height or weight on file to calculate BMI. Constitutional Well developed. Well nourished.  Vascular Dorsalis pedis pulses palpable bilaterally. Posterior tibial pulses palpable bilaterally. Capillary refill normal to all digits.  No cyanosis or clubbing noted. Pedal hair growth normal.  Neurologic Normal speech. Oriented to person, place, and time. Protective sensation absent  Dermatologic Wound Location: Right submetatarsal 1 ulceration noted after debridement Wound Base: Granular/Healthy Peri-wound: Calloused Exudate: None: wound tissue dry Wound Measurements: -See below  Orthopedic: No pain to palpation either foot.   Radiographs: 3 views of skeletally mature adult foot: Severe bunion deformity noted.  However no foreign body noted.  No soft tissue emphysema noted.  No signs of osteomyelitic changes noted. Assessment:   1. Hav (hallux abducto valgus), right   2. Type 2 diabetes mellitus without complication, with long-term current use of insulin ([place])   3. Right  foot ulcer, limited to breakdown of skin ([place])    Plan:  Patient was evaluated and treated and all questions answered.  Ulcer right submetatarsal 1 ulceration -Debridement as below. -Dressed with triple antibiotic and a Band-Aid. -I will hold off on dispensing surgical shoe at this time however if the wound regresses or gets bigger I will give a surgical shoe during next visit.  Procedure: Excisional Debridement of Wound Rationale: Removal of non-viable soft tissue from the wound to promote healing.  Anesthesia: none Pre-Debridement Wound Measurements: 0.2 cm x 0.2 cm x 0.2 cm  Post-Debridement Wound Measurements: 0.1 cm x 0.1 cm x 0.1 cm  Type of Debridement: Sharp Excisional Tissue Removed: Non-viable soft tissue Depth of Debridement: Full-thickness to dermal  layer without fat layer exposed Technique: Sharp excisional debridement to bleeding, viable wound base.  Dressing: Dry, sterile, compression dressing. Disposition: Patient tolerated procedure well. Patient to return in 1 week for follow-up.  Return in about 2 weeks (around 11/02/2019) for Toe Ulcer.

## 2019-11-03 ENCOUNTER — Ambulatory Visit: Payer: Medicare Other | Admitting: Podiatry

## 2019-11-11 ENCOUNTER — Other Ambulatory Visit: Payer: Self-pay

## 2019-11-11 ENCOUNTER — Emergency Department
Admission: EM | Admit: 2019-11-11 | Discharge: 2019-11-11 | Disposition: A | Discharge status: Home / Self Care | Payer: Medicare Other | Attending: Emergency Medicine | Admitting: Emergency Medicine

## 2019-11-11 ENCOUNTER — Emergency Department: Payer: Medicare Other

## 2019-11-11 DIAGNOSIS — Y9289 Other specified places as the place of occurrence of the external cause: Secondary | ICD-10-CM | POA: Diagnosis not present

## 2019-11-11 DIAGNOSIS — M79601 Pain in right arm: Secondary | ICD-10-CM | POA: Insufficient documentation

## 2019-11-11 DIAGNOSIS — Y9389 Activity, other specified: Secondary | ICD-10-CM | POA: Diagnosis not present

## 2019-11-11 DIAGNOSIS — N183 Chronic kidney disease, stage 3 unspecified: Secondary | ICD-10-CM | POA: Insufficient documentation

## 2019-11-11 DIAGNOSIS — I129 Hypertensive chronic kidney disease with stage 1 through stage 4 chronic kidney disease, or unspecified chronic kidney disease: Secondary | ICD-10-CM | POA: Insufficient documentation

## 2019-11-11 DIAGNOSIS — Y92009 Unspecified place in unspecified non-institutional (private) residence as the place of occurrence of the external cause: Secondary | ICD-10-CM

## 2019-11-11 DIAGNOSIS — Z79899 Other long term (current) drug therapy: Secondary | ICD-10-CM | POA: Insufficient documentation

## 2019-11-11 DIAGNOSIS — W109XXA Fall (on) (from) unspecified stairs and steps, initial encounter: Secondary | ICD-10-CM | POA: Diagnosis not present

## 2019-11-11 DIAGNOSIS — R0789 Other chest pain: Secondary | ICD-10-CM | POA: Diagnosis present

## 2019-11-11 DIAGNOSIS — Z7984 Long term (current) use of oral hypoglycemic drugs: Secondary | ICD-10-CM | POA: Insufficient documentation

## 2019-11-11 DIAGNOSIS — E1122 Type 2 diabetes mellitus with diabetic chronic kidney disease: Secondary | ICD-10-CM | POA: Insufficient documentation

## 2019-11-11 DIAGNOSIS — J45909 Unspecified asthma, uncomplicated: Secondary | ICD-10-CM | POA: Diagnosis not present

## 2019-11-11 DIAGNOSIS — Y999 Unspecified external cause status: Secondary | ICD-10-CM | POA: Diagnosis not present

## 2019-11-11 DIAGNOSIS — M79602 Pain in left arm: Secondary | ICD-10-CM | POA: Diagnosis not present

## 2019-11-11 DIAGNOSIS — W19XXXA Unspecified fall, initial encounter: Secondary | ICD-10-CM

## 2019-11-11 DIAGNOSIS — Z87891 Personal history of nicotine dependence: Secondary | ICD-10-CM | POA: Diagnosis not present

## 2019-11-11 MED ORDER — OXYCODONE HCL 5 MG PO TABS
5.0000 mg | ORAL_TABLET | Freq: Once | ORAL | Status: AC
  Administered 2019-11-11: 14:00:00 5 mg via ORAL
  Filled 2019-11-11: qty 1

## 2019-11-11 NOTE — ED Provider Notes (Signed)
[facility] Emergency Department Provider Note  ____________________________________________   First MD Initiated Contact with Patient 11/11/19 1305     (approximate)  I have reviewed the triage vital signs and the nursing notes.   HISTORY  Chief Complaint Fall and Chest Pain   HPI [name] [name] is a 69 y.o. female presents to the ED via EMS with complaint of falling forward and hitting her chest and mouth.  Patient states that she uses a cane to walk and was on a step when she tripped either over her foot or on the cane.  Patient denies any head injury or loss of consciousness.  She denies any chest pain, shortness of breath or dizziness prior to her fall.  Patient also has chronic upper extremity pain and reports that she is getting physical therapy.  She rates her pain as a 10/10.      Past Medical History:  Diagnosis Date   Anemia    Asthma    Chronic airway obstruction (HCC)    Diabetes mellitus without complication (HCC)    GERD (gastroesophageal reflux disease)    History of adenomatous polyp of colon    History of bone density study    Hyperlipidemia    Hypertension    Idiopathic peripheral neuropathy    Morbid obesity (HCC)    Osteoporosis    Plantar fascial fibromatosis    Pulmonary fibrosis (HCC)    Sleep apnea    Hypersomnia with sleep apnea   TIA (transient ischemic attack)     Patient Active Problem List   Diagnosis Date Noted   Chronic pain syndrome 09/15/2018   AVD (aortic valve disease) 10/07/2017   Benign essential hypertension 06/17/2017   Nonrheumatic aortic valve insufficiency 06/17/2017   CKD (chronic kidney disease) stage 3, GFR 30-59 ml/min 05/20/2017   Wound dehiscence 11/05/2016   Lumbar stenosis 10/15/2016   Renal impairment 10/05/2016   Right lumbar radiculopathy 10/05/2016   Complete tear of right rotator cuff 11/28/2015   Rotator cuff arthropathy, right 11/28/2015   Low vitamin D  level 02/28/2015   Gastroesophageal reflux disease without esophagitis 12/14/2014   Morbid obesity with BMI of 40.0-44.9, adult ([place]) 10/06/2014   Aortic stenosis 09/14/2014   DOE (dyspnea on exertion) 09/08/2014   History of adenomatous polyp of colon 09/08/2014   DM2 (diabetes mellitus, type 2) ([place]) 03/22/2014   Hyperlipidemia 03/22/2014    Past Surgical History:  Procedure Laterality Date   ABDOMINAL HYSTERECTOMY     BACK SURGERY     BLADDER SUSPENSION     CESAREAN SECTION     COLONOSCOPY     DILATION AND CURETTAGE OF UTERUS     ESOPHAGOGASTRODUODENOSCOPY     LUMBAR LAMINECTOMY/DECOMPRESSION MICRODISCECTOMY N/A 10/15/2016   Procedure: L3-L5 Laminectomy ;  Surgeon: Deetta Perla, MD;  Location: [facility] ORS;  Service: Neurosurgery;  Laterality: N/A;   LUMBAR WOUND DEBRIDEMENT N/A 11/05/2016   Procedure: LUMBAR WOUND DEBRIDEMENT;  Surgeon: Deetta Perla, MD;  Location: [facility] ORS;  Service: Neurosurgery;  Laterality: N/A;   TUBAL LIGATION      Prior to Admission medications   Medication Sig Start Date End Date Taking? Authorizing Provider  acetaminophen (TYLENOL) 650 MG CR tablet Take 650 mg by mouth daily.    [provider]  albuterol (VENTOLIN HFA) 108 (90 Base) MCG/ACT inhaler SMARTSIG:2 Puff(s) By Mouth Every 6 Hours PRN 05/21/19   [provider]  allopurinol (ZYLOPRIM) 100 MG tablet Take 100 mg by mouth daily.  [provider]  budesonide-formoterol (SYMBICORT) 160-4.5 MCG/ACT inhaler Inhale 2 puffs into the lungs 2 (two) times daily.    [provider]  clotrimazole-betamethasone (LOTRISONE) cream Apply 1 application topically 2 (two) times daily.    [provider]  cyclobenzaprine (FLEXERIL) 5 MG tablet Take 5 mg by mouth 2 (two) times daily as needed for muscle spasms.    [provider]  DULoxetine (CYMBALTA) 30 MG capsule Take 30 mg by mouth daily. 07/17/19   [provider]  esomeprazole (NEXIUM) 40  MG capsule Take 40 mg by mouth daily at 12 noon.    [provider]  gabapentin (NEURONTIN) 300 MG capsule Take 300 mg by mouth daily.    [provider]  glimepiride (AMARYL) 2 MG tablet Take 2 mg by mouth every morning. 07/23/19   [provider]  hydrochlorothiazide (MICROZIDE) 12.5 MG capsule Take by mouth.    [provider]  levalbuterol Penne Lash) 1.25 MG/3ML nebulizer solution Inhale 1.25 mg into the lungs every 4 (four) hours as needed for wheezing or shortness of breath.  08/16/16 08/16/17  [provider]  losartan (COZAAR) 50 MG tablet Take 50 mg by mouth daily. 07/28/19   [provider]  metFORMIN (GLUCOPHAGE) 500 MG tablet Take 500 mg by mouth 2 (two) times daily with a meal.    [provider]  methocarbamol (ROBAXIN) 500 MG tablet Take 1 tablet (500 mg total) by mouth every 8 (eight) hours as needed for muscle spasms. 11/06/16   Deetta Perla, MD  mometasone-formoterol [facility]) 100-5 MCG/ACT AERO Inhale 2 puffs into the lungs 2 (two) times daily.    [provider]  nystatin cream (MYCOSTATIN) Apply 1 application topically 2 (two) times daily. Patient taking differently: Apply 1 application topically daily as needed for dry skin (rash).  07/31/15   Triplett, Cari B, FNP  oxyCODONE (OXY IR/ROXICODONE) 5 MG immediate release tablet Take 1-2 tablets (5-10 mg total) by mouth every 4 (four) hours as needed for moderate pain (5 mg for 4-6/10 pain, 10 mg for 7-8/10 pain). 10/15/16   Deetta Perla, MD  oxyCODONE (ROXICODONE) 5 MG immediate release tablet Take 1 tablet (5 mg total) by mouth every 4 (four) hours as needed for severe pain. 11/08/16   Abd-El-Barr, Rogue Jury, MD  predniSONE (STERAPRED UNI-PAK 21 TAB) 10 MG (21) TBPK tablet Take 6 tablets on day 1, take 5 tablets on day 2, take 4 tablets on day 3, take 3 tablets on day 4, take 2 tablets on day 5, take 1 tablet on day 6 05/12/17   Laban Emperor, PA-C  senna (SENOKOT) 8.6 MG  TABS tablet Take 1 tablet (8.6 mg total) by mouth 2 (two) times daily. 10/16/16   Deetta Perla, MD  simvastatin (ZOCOR) 20 MG tablet Take 20 mg by mouth daily.    [provider]  triamterene-hydrochlorothiazide (DYAZIDE) 37.5-25 MG capsule Take 1 capsule by mouth daily.    [provider]    Allergies Acetaminophen-codeine, Codeine, Furosemide, Penicillin v potassium, Quinine, Tramadol, and Tylenol [acetaminophen]  Family History  Problem Relation Age of Onset   Stroke Mother    Heart attack Mother    Breast cancer Sister 26   Stroke Sister    Heart attack Sister    Breast cancer Cousin        maternal side 21's    Social History Social History   Tobacco Use   Smoking status: Former Smoker   Smokeless tobacco: Never Used  Substance  Use Topics   Alcohol use: No   Drug use: No    Review of Systems Constitutional: No fever/chills Eyes: No visual changes. ENT: Injury to mouth. Cardiovascular: Denies chest pain. Respiratory: Denies shortness of breath. Gastrointestinal: No abdominal pain.  No nausea, no vomiting.  Genitourinary: Negative for dysuria. Musculoskeletal: Positive for anterior chest wall pain. Skin: Negative for rash. Neurological: Negative for headaches, focal weakness or numbness. ___________________________________________   PHYSICAL EXAM:  VITAL SIGNS: ED Triage Vitals  Enc Vitals Group     BP 11/11/19 1257 (!) 147/71     Pulse Rate 11/11/19 1257 (!) 108     Resp 11/11/19 1257 20     Temp 11/11/19 1257 98.1 F (36.7 C)     Temp Source 11/11/19 1257 Oral     SpO2 11/11/19 1257 100 %     Weight 11/11/19 1254 217 lb (98.4 kg)     Height 11/11/19 1254 5\' 1"  (1.549 m)     Head Circumference --      Peak Flow --      Pain Score 11/11/19 1254 10     Pain Loc --      Pain Edu? --      Excl. in [place]? --    Constitutional: Alert and oriented. Well appearing and in no acute distress.  Is talkative and cooperative during  exam. Eyes: Conjunctivae are normal. PERRL. EOMI. Head: Atraumatic. Nose: No congestion/rhinnorhea. Mouth/Throat: Mucous membranes are moist.  Oropharynx non-erythematous.  Lower anterior teeth are slightly loose but no gum injury or bleeding is noted. Neck: No stridor.   Cardiovascular: Normal rate, regular rhythm. Grossly normal heart sounds.  Good peripheral circulation. Respiratory: Normal respiratory effort.  No retractions. Lungs CTAB.  No point tenderness is noted with palpation of the ribs bilaterally.  There is no deformity.  Also inspecting the anterior chest wall there is no edema, ecchymosis or abrasions noted.  There is some tenderness noted along the mid sternum without soft tissue edema or injury noted. Gastrointestinal: Soft and nontender. No distention.  Musculoskeletal: Nontender upper extremities and no tenderness is elicited to the hips bilaterally or lower extremities.  Nontender lumbar spine. Neurologic:  Normal speech and language. No gross focal neurologic deficits are appreciated. No gait instability. Skin:  Skin is warm, dry and intact.  No ecchymosis, edema or abrasions noted. Psychiatric: Mood and affect are normal. Speech and behavior are normal.  ____________________________________________   LABS (all labs ordered are listed, but only abnormal results are displayed)  Labs Reviewed - No data to display ____________________________________________  RADIOLOGY  Official radiology report(s): DG Chest 2 View  Result Date: 11/11/2019 CLINICAL DATA:  Fall today. Distal sternal pain. History diabetes and asthma. EXAM: CHEST - 2 VIEW COMPARISON:  Radiographs 10/17/2016. CT 06/16/2018. FINDINGS: The heart size and mediastinal contours are stable with aortic atherosclerosis. The lungs are clear. There is no pleural effusion or pneumothorax. The sternum is not well visualized, although there is no evidence of sternal fracture or retrosternal hematoma. Old fracture of the  mid left humerus and narrowing of the subacromial space of both shoulders noted. IMPRESSION: No evidence of acute chest injury. The sternum is not well visualized on these views. Electronically Signed   By: Richardean Sale M.D.   On: 11/11/2019 13:53    ____________________________________________   PROCEDURES  Procedure(s) performed (including Critical Care):  Procedures   ____________________________________________   INITIAL IMPRESSION / ASSESSMENT AND PLAN / ED COURSE  As part of my medical decision  making, I reviewed the following data within the electronic MEDICAL RECORD NUMBER Notes from prior ED visits and  Controlled Substance Database  69 year old female presents to the ED via EMS with a mechanical fall with injury to her mouth and anterior chest.  Patient walks with a cane and states that she was walking and either tripped on the cane earlier on her feet.  She denies any head injury.  Chest x-ray was negative for acute fracture.  Physical exam with low suspicion for fracture.  On examination of her mouth her teeth were intact but slightly loose but no injury to the gum is noted.  Patient was made aware that her x-rays were negative and she will comply with eating soft food.  She was made aware that her glucose was 247.  She states that she took all her medication except for her pain medication today.  She was given oxycodone 5 mg prior to discharge.  She is to follow-up with her PCP if any continued problems.  ____________________________________________   FINAL CLINICAL IMPRESSION(S) / ED DIAGNOSES  Final diagnoses:  Anterior chest wall pain  Fall at home, initial encounter     ED Discharge Orders    None       Note:  This document was prepared using Dragon voice recognition software and may include unintentional dictation errors.    Johnn Hai, PA-C 11/11/19 1514    Lavonia Drafts, MD 11/11/19 1524

## 2019-11-11 NOTE — ED Notes (Signed)
This RN called patient's son with patient's permission to give update on care.  Son verbalized understanding of care given.  Will update son again with further plan once established.

## 2019-11-11 NOTE — Discharge Instructions (Addendum)
Follow-up with your primary care provider if any continued problems.  Continue taking your medication as directed by your doctor.  Also you may continue taking the pain medication as your chest may be sore and painful for several days because of your fall.  You may use ice to this area also to help with pain.

## 2019-11-11 NOTE — ED Triage Notes (Addendum)
Pt to ED via [facility] from home for chief complaint of fall and chest pain. EMS reports pt hit her mouth with fall today, reporting chest pain and arm pain in both arms which is chronic. No obvious injury noted to mouth CBG 247. BP [id number] systolic. 97% RA. HR 120s.  Denies back and neck pain at this time.  Alert and oriented x4.  No blood thinner use. Pt denies that her chest was hurting before the fall today.

## 2019-11-11 NOTE — ED Notes (Signed)
Pt's son, Sonie Omerovic, [phone] comes to lobby, asking for update.  RN, Vicente Males made aware.

## 2019-11-14 ENCOUNTER — Other Ambulatory Visit: Payer: Self-pay

## 2019-11-14 ENCOUNTER — Emergency Department
Admission: EM | Admit: 2019-11-14 | Discharge: 2019-11-14 | Disposition: A | Discharge status: Home / Self Care | Payer: Medicare Other | Attending: Emergency Medicine | Admitting: Emergency Medicine

## 2019-11-14 ENCOUNTER — Emergency Department: Payer: Medicare Other

## 2019-11-14 ENCOUNTER — Encounter: Payer: Self-pay | Admitting: Intensive Care

## 2019-11-14 DIAGNOSIS — N183 Chronic kidney disease, stage 3 unspecified: Secondary | ICD-10-CM | POA: Diagnosis not present

## 2019-11-14 DIAGNOSIS — S2231XD Fracture of one rib, right side, subsequent encounter for fracture with routine healing: Secondary | ICD-10-CM | POA: Diagnosis present

## 2019-11-14 DIAGNOSIS — E1122 Type 2 diabetes mellitus with diabetic chronic kidney disease: Secondary | ICD-10-CM | POA: Insufficient documentation

## 2019-11-14 DIAGNOSIS — W010XXD Fall on same level from slipping, tripping and stumbling without subsequent striking against object, subsequent encounter: Secondary | ICD-10-CM | POA: Diagnosis not present

## 2019-11-14 DIAGNOSIS — J45909 Unspecified asthma, uncomplicated: Secondary | ICD-10-CM | POA: Insufficient documentation

## 2019-11-14 DIAGNOSIS — I129 Hypertensive chronic kidney disease with stage 1 through stage 4 chronic kidney disease, or unspecified chronic kidney disease: Secondary | ICD-10-CM | POA: Insufficient documentation

## 2019-11-14 DIAGNOSIS — Z87891 Personal history of nicotine dependence: Secondary | ICD-10-CM | POA: Diagnosis not present

## 2019-11-14 DIAGNOSIS — S2231XA Fracture of one rib, right side, initial encounter for closed fracture: Secondary | ICD-10-CM

## 2019-11-14 DIAGNOSIS — W19XXXD Unspecified fall, subsequent encounter: Secondary | ICD-10-CM

## 2019-11-14 LAB — COMPREHENSIVE METABOLIC PANEL
ALT: 19 U/L (ref 0–44)
AST: 23 U/L (ref 15–41)
Albumin: 3.8 g/dL (ref 3.5–5.0)
Alkaline Phosphatase: 82 U/L (ref 38–126)
Anion gap: 14 (ref 5–15)
BUN: 28 mg/dL — ABNORMAL HIGH (ref 8–23)
CO2: 22 mmol/L (ref 22–32)
Calcium: 9.2 mg/dL (ref 8.9–10.3)
Chloride: 104 mmol/L (ref 98–111)
Creatinine, Ser: 1.52 mg/dL — ABNORMAL HIGH (ref 0.44–1.00)
GFR calc Af Amer: 41 mL/min — ABNORMAL LOW (ref >60)
GFR calc non Af Amer: 35 mL/min — ABNORMAL LOW (ref >60)
Glucose, Bld: 209 mg/dL — ABNORMAL HIGH (ref 70–99)
Potassium: 4.4 mmol/L (ref 3.5–5.1)
Sodium: 140 mmol/L (ref 135–145)
Total Bilirubin: 1 mg/dL (ref 0.3–1.2)
Total Protein: 7.2 g/dL (ref 6.5–8.1)

## 2019-11-14 LAB — CBC WITH DIFFERENTIAL/PLATELET
Abs Immature Granulocytes: 0.91 10*3/uL — ABNORMAL HIGH (ref 0.00–0.07)
Basophils Absolute: 0.2 10*3/uL — ABNORMAL HIGH (ref 0.0–0.1)
Basophils Relative: 1 %
Eosinophils Absolute: 0.3 10*3/uL (ref 0.0–0.5)
Eosinophils Relative: 2 %
HCT: 42.2 % (ref 36.0–46.0)
Hemoglobin: 13.2 g/dL (ref 12.0–15.0)
Immature Granulocytes: 5 %
Lymphocytes Relative: 20 %
Lymphs Abs: 3.6 10*3/uL (ref 0.7–4.0)
MCH: 28.4 pg (ref 26.0–34.0)
MCHC: 31.3 g/dL (ref 30.0–36.0)
MCV: 90.9 fL (ref 80.0–100.0)
Monocytes Absolute: 1.6 10*3/uL — ABNORMAL HIGH (ref 0.1–1.0)
Monocytes Relative: 9 %
Neutro Abs: 11.3 10*3/uL — ABNORMAL HIGH (ref 1.7–7.7)
Neutrophils Relative %: 63 %
Platelets: 386 10*3/uL (ref 150–400)
RBC: 4.64 MIL/uL (ref 3.87–5.11)
RDW: 16 % — ABNORMAL HIGH (ref 11.5–15.5)
WBC: 18 10*3/uL — ABNORMAL HIGH (ref 4.0–10.5)
nRBC: 0.1 % (ref 0.0–0.2)

## 2019-11-14 LAB — BLOOD GAS, VENOUS
Acid-Base Excess: 0.5 mmol/L (ref 0.0–2.0)
Bicarbonate: 25.4 mmol/L (ref 20.0–28.0)
O2 Saturation: 69 %
Patient temperature: 37
pCO2, Ven: 41 mmHg — ABNORMAL LOW (ref 44.0–60.0)
pH, Ven: 7.4 (ref 7.250–7.430)
pO2, Ven: 36 mmHg (ref 32.0–45.0)

## 2019-11-14 LAB — TROPONIN I (HIGH SENSITIVITY): Troponin I (High Sensitivity): 13 ng/L (ref <18)

## 2019-11-14 MED ORDER — OXYCODONE HCL 5 MG PO TABS: 5.0000 mg | ORAL_TABLET | ORAL | 0 refills | Status: DC | PRN

## 2019-11-14 MED ORDER — MORPHINE SULFATE (PF) 4 MG/ML IV SOLN
4.0000 mg | Freq: Once | INTRAVENOUS | Status: AC
  Administered 2019-11-14: 11:00:00 4 mg via INTRAVENOUS
  Filled 2019-11-14: qty 1

## 2019-11-14 MED ORDER — OXYCODONE HCL 5 MG PO TABS
5.0000 mg | ORAL_TABLET | Freq: Once | ORAL | Status: AC
  Administered 2019-11-14: 5 mg via ORAL
  Filled 2019-11-14: qty 1

## 2019-11-14 NOTE — ED Provider Notes (Signed)
[facility] Emergency Department Provider Note       Time seen: ----------------------------------------- 10:09 AM on 11/14/2019 -----------------------------------------   I have reviewed the triage vital signs and the nursing notes.  HISTORY   Chief Complaint Chest Pain (rib cage pain right)    HPI [name] [name] is a 69 y.o. female with a history of anemia, asthma, diabetes, GERD, hyperlipidemia, hypertension, pulmonary fibrosis who presents to the ED for right-sided rib pain.  Patient reports falling on February 3 and was seen here at [facility].  Patient is requesting right-sided rib x-rays.  Pain has gone into her back on the right side.  Patient complains of chronic shortness of breath.  Past Medical History:  Diagnosis Date  . Anemia   . Asthma   . Chronic airway obstruction ([place])   . Diabetes mellitus without complication ([place])   . GERD (gastroesophageal reflux disease)   . History of adenomatous polyp of colon   . History of bone density study   . Hyperlipidemia   . Hypertension   . Idiopathic peripheral neuropathy   . Morbid obesity ([place])   . Osteoporosis   . Plantar fascial fibromatosis   . Pulmonary fibrosis ([place])   . Sleep apnea    Hypersomnia with sleep apnea  . TIA (transient ischemic attack)     Patient Active Problem List   Diagnosis Date Noted  . Chronic pain syndrome 09/15/2018  . AVD (aortic valve disease) 10/07/2017  . Benign essential hypertension 06/17/2017  . Nonrheumatic aortic valve insufficiency 06/17/2017  . CKD (chronic kidney disease) stage 3, GFR 30-59 ml/min 05/20/2017  . Wound dehiscence 11/05/2016  . Lumbar stenosis 10/15/2016  . Renal impairment 10/05/2016  . Right lumbar radiculopathy 10/05/2016  . Complete tear of right rotator cuff 11/28/2015  . Rotator cuff arthropathy, right 11/28/2015  . Low vitamin D level 02/28/2015  . Gastroesophageal reflux disease without esophagitis  12/14/2014  . Morbid obesity with BMI of 40.0-44.9, adult ([place]) 10/06/2014  . Aortic stenosis 09/14/2014  . DOE (dyspnea on exertion) 09/08/2014  . History of adenomatous polyp of colon 09/08/2014  . DM2 (diabetes mellitus, type 2) ([place]) 03/22/2014  . Hyperlipidemia 03/22/2014    Past Surgical History:  Procedure Laterality Date  . ABDOMINAL HYSTERECTOMY    . BACK SURGERY    . BLADDER SUSPENSION    . CESAREAN SECTION    . COLONOSCOPY    . DILATION AND CURETTAGE OF UTERUS    . ESOPHAGOGASTRODUODENOSCOPY    . LUMBAR LAMINECTOMY/DECOMPRESSION MICRODISCECTOMY N/A 10/15/2016   Procedure: L3-L5 Laminectomy ;  Surgeon: Deetta Perla, MD;  Location: [facility] ORS;  Service: Neurosurgery;  Laterality: N/A;  . LUMBAR WOUND DEBRIDEMENT N/A 11/05/2016   Procedure: LUMBAR WOUND DEBRIDEMENT;  Surgeon: Deetta Perla, MD;  Location: [facility] ORS;  Service: Neurosurgery;  Laterality: N/A;  . TUBAL LIGATION      Allergies Acetaminophen-codeine, Codeine, Furosemide, Penicillin v potassium, Quinine, Tramadol, and Tylenol [acetaminophen]  Social History Social History   Tobacco Use  . Smoking status: Former Research scientist (life sciences)  . Smokeless tobacco: Never Used  Substance Use Topics  . Alcohol use: No  . Drug use: No    Review of Systems Constitutional: Negative for fever. Cardiovascular: Positive for chest pain Respiratory: Negative for shortness of breath. Gastrointestinal: Negative for abdominal pain, vomiting and diarrhea. Musculoskeletal: Positive for back pain Skin: Negative for rash. Neurological: Negative for headaches, focal weakness or numbness.  All systems negative/normal/unremarkable except as stated in the HPI  ____________________________________________   PHYSICAL EXAM:  VITAL SIGNS: ED Triage Vitals  Enc Vitals Group     BP 11/14/19 0956 125/70     Pulse Rate 11/14/19 0956 (!) 115     Resp 11/14/19 0956 16     Temp 11/14/19 0954 98 F (36.7 C)     Temp Source 11/14/19 0954 Oral     SpO2  11/14/19 0956 99 %     Weight 11/14/19 0954 217 lb (98.4 kg)     Height 11/14/19 0954 5\' 1"  (1.549 m)     Head Circumference --      Peak Flow --      Pain Score 11/14/19 0954 10     Pain Loc --      Pain Edu? --      Excl. in [place]? --     Constitutional: Alert and oriented.  Morbidly obese, mild to moderate distress Eyes: Conjunctivae are normal. Normal extraocular movements. ENT      Head: Normocephalic and atraumatic.      Nose: No congestion/rhinnorhea.      Mouth/Throat: Mucous membranes are moist.      Neck: No stridor. Cardiovascular: Rapid rate, regular rhythm. No murmurs, rubs, or gallops. Respiratory: Tachypnea with clear breath sounds Gastrointestinal: Soft and nontender. Normal bowel sounds Musculoskeletal: Nontender with normal range of motion in extremities. No lower extremity tenderness nor edema. Neurologic:  Normal speech and language. No gross focal neurologic deficits are appreciated.  Skin: Scattered bruising is noted Psychiatric: Mood and affect are normal. Speech and behavior are normal.  ____________________________________________  EKG: Interpreted by me.  Sinus tachycardia with a rate of 110 bpm, right bundle branch block, normal axis, long QT  ____________________________________________  ED COURSE:  As part of my medical decision making, I reviewed the following data within the [place] History obtained from family if available, nursing notes, old chart and ekg, as well as notes from prior ED visits. Patient presented for recent fall with right-sided chest pain, we will assess with labs and imaging as indicated at this time. Clinical Course as of Nov 13 1298  Sat Nov 14, 2019  1059 X-ray does reveal single inferior rib fracture on the right   [JW]    Clinical Course User Index [JW] Earleen Newport, MD   Procedures  [name] [name] was evaluated in Emergency Department on 11/14/2019 for the symptoms described in the history of  present illness. She was evaluated in the context of the global COVID-19 pandemic, which necessitated consideration that the patient might be at risk for infection with the SARS-CoV-2 virus that causes COVID-19. Institutional protocols and algorithms that pertain to the evaluation of patients at risk for COVID-19 are in a state of rapid change based on information released by regulatory bodies including the CDC and federal and state organizations. These policies and algorithms were followed during the patient's care in the ED.  ____________________________________________   LABS (pertinent positives/negatives)  Labs Reviewed  COMPREHENSIVE METABOLIC PANEL - Abnormal; Notable for the following components:      Result Value   Glucose, Bld 209 (*)    BUN 28 (*)    Creatinine, Ser 1.52 (*)    GFR calc non Af Amer 35 (*)    GFR calc Af Amer 41 (*)    All other components within normal limits  CBC WITH DIFFERENTIAL/PLATELET - Abnormal; Notable for the following components:   WBC 18.0 (*)    RDW 16.0 (*)  Neutro Abs 11.3 (*)    Monocytes Absolute 1.6 (*)    Basophils Absolute 0.2 (*)    Abs Immature Granulocytes 0.91 (*)    All other components within normal limits  BLOOD GAS, VENOUS - Abnormal; Notable for the following components:   pCO2, Ven 41 (*)    All other components within normal limits  TROPONIN I (HIGH SENSITIVITY)  TROPONIN I (HIGH SENSITIVITY)    RADIOLOGY Images were viewed by me  Chest x-ray with right rib views IMPRESSION: Minimally displaced right tenth rib fracture. No acute cardiopulmonary abnormality seen. ____________________________________________   DIFFERENTIAL DIAGNOSIS   Musculoskeletal pain, rib fracture, contusion, PE, pneumothorax, MI  FINAL ASSESSMENT AND PLAN  Chest wall pain, recent fall, rib fracture   Plan: The patient had presented for right-sided chest wall pain from recent fall. Patient's labs did reveal leukocytosis which is likely from  her steroid intake, no obvious infection was discovered.  She has not had any fevers, chills or other complaints.  Patient's imaging did reveal a right 10th rib fracture which will be treated conservatively.  She is cleared for outpatient follow-up with her doctor.   Laurence Aly, MD    Note: This note was generated in part or whole with voice recognition software. Voice recognition is usually quite accurate but there are transcription errors that can and very often do occur. I apologize for any typographical errors that were not detected and corrected.     Earleen Newport, MD 11/14/19 1302

## 2019-11-14 NOTE — ED Triage Notes (Addendum)
Patient reports falling Wednesday 11/11/19 and was seen here at [facility]. Patient wants xray of rib cage. Reports they took chest xray but did not look at right sided rib cage where she is hurting. Reports she is here for pain control and her pain that is around her right rib cage and goes into upper back

## 2019-11-18 ENCOUNTER — Emergency Department: Payer: Medicare Other

## 2019-11-18 ENCOUNTER — Other Ambulatory Visit: Payer: Self-pay

## 2019-11-18 ENCOUNTER — Inpatient Hospital Stay
Admission: EM | Admit: 2019-11-18 | Discharge: 2019-11-24 | DRG: 202 | Disposition: A | Discharge status: Home Health | Payer: Medicare Other | Attending: Internal Medicine | Admitting: Internal Medicine

## 2019-11-18 ENCOUNTER — Encounter: Payer: Self-pay | Admitting: Emergency Medicine

## 2019-11-18 DIAGNOSIS — Z6841 Body Mass Index (BMI) 40.0 and over, adult: Secondary | ICD-10-CM

## 2019-11-18 DIAGNOSIS — I10 Essential (primary) hypertension: Secondary | ICD-10-CM | POA: Diagnosis not present

## 2019-11-18 DIAGNOSIS — Y92009 Unspecified place in unspecified non-institutional (private) residence as the place of occurrence of the external cause: Secondary | ICD-10-CM

## 2019-11-18 DIAGNOSIS — D509 Iron deficiency anemia, unspecified: Secondary | ICD-10-CM | POA: Diagnosis present

## 2019-11-18 DIAGNOSIS — S2239XA Fracture of one rib, unspecified side, initial encounter for closed fracture: Secondary | ICD-10-CM | POA: Diagnosis present

## 2019-11-18 DIAGNOSIS — E860 Dehydration: Secondary | ICD-10-CM | POA: Diagnosis present

## 2019-11-18 DIAGNOSIS — E875 Hyperkalemia: Secondary | ICD-10-CM | POA: Diagnosis not present

## 2019-11-18 DIAGNOSIS — Z8673 Personal history of transient ischemic attack (TIA), and cerebral infarction without residual deficits: Secondary | ICD-10-CM

## 2019-11-18 DIAGNOSIS — I129 Hypertensive chronic kidney disease with stage 1 through stage 4 chronic kidney disease, or unspecified chronic kidney disease: Secondary | ICD-10-CM | POA: Diagnosis present

## 2019-11-18 DIAGNOSIS — F329 Major depressive disorder, single episode, unspecified: Secondary | ICD-10-CM | POA: Diagnosis present

## 2019-11-18 DIAGNOSIS — D631 Anemia in chronic kidney disease: Secondary | ICD-10-CM | POA: Diagnosis present

## 2019-11-18 DIAGNOSIS — Z90710 Acquired absence of both cervix and uterus: Secondary | ICD-10-CM

## 2019-11-18 DIAGNOSIS — Z20822 Contact with and (suspected) exposure to covid-19: Secondary | ICD-10-CM | POA: Diagnosis present

## 2019-11-18 DIAGNOSIS — J449 Chronic obstructive pulmonary disease, unspecified: Secondary | ICD-10-CM | POA: Diagnosis present

## 2019-11-18 DIAGNOSIS — Z79891 Long term (current) use of opiate analgesic: Secondary | ICD-10-CM

## 2019-11-18 DIAGNOSIS — E1165 Type 2 diabetes mellitus with hyperglycemia: Secondary | ICD-10-CM | POA: Diagnosis present

## 2019-11-18 DIAGNOSIS — M109 Gout, unspecified: Secondary | ICD-10-CM | POA: Diagnosis present

## 2019-11-18 DIAGNOSIS — E1122 Type 2 diabetes mellitus with diabetic chronic kidney disease: Secondary | ICD-10-CM | POA: Diagnosis present

## 2019-11-18 DIAGNOSIS — G8929 Other chronic pain: Secondary | ICD-10-CM | POA: Diagnosis present

## 2019-11-18 DIAGNOSIS — S2243XA Multiple fractures of ribs, bilateral, initial encounter for closed fracture: Secondary | ICD-10-CM | POA: Diagnosis present

## 2019-11-18 DIAGNOSIS — N1831 Chronic kidney disease, stage 3a: Secondary | ICD-10-CM | POA: Diagnosis present

## 2019-11-18 DIAGNOSIS — Z823 Family history of stroke: Secondary | ICD-10-CM

## 2019-11-18 DIAGNOSIS — Z803 Family history of malignant neoplasm of breast: Secondary | ICD-10-CM

## 2019-11-18 DIAGNOSIS — M810 Age-related osteoporosis without current pathological fracture: Secondary | ICD-10-CM | POA: Diagnosis present

## 2019-11-18 DIAGNOSIS — Z7952 Long term (current) use of systemic steroids: Secondary | ICD-10-CM

## 2019-11-18 DIAGNOSIS — Z7984 Long term (current) use of oral hypoglycemic drugs: Secondary | ICD-10-CM

## 2019-11-18 DIAGNOSIS — J9811 Atelectasis: Secondary | ICD-10-CM | POA: Diagnosis present

## 2019-11-18 DIAGNOSIS — J45901 Unspecified asthma with (acute) exacerbation: Principal | ICD-10-CM | POA: Diagnosis present

## 2019-11-18 DIAGNOSIS — J8410 Pulmonary fibrosis, unspecified: Secondary | ICD-10-CM | POA: Diagnosis present

## 2019-11-18 DIAGNOSIS — R0602 Shortness of breath: Secondary | ICD-10-CM | POA: Diagnosis not present

## 2019-11-18 DIAGNOSIS — Z86010 Personal history of colonic polyps: Secondary | ICD-10-CM

## 2019-11-18 DIAGNOSIS — E785 Hyperlipidemia, unspecified: Secondary | ICD-10-CM | POA: Diagnosis present

## 2019-11-18 DIAGNOSIS — G4710 Hypersomnia, unspecified: Secondary | ICD-10-CM | POA: Diagnosis present

## 2019-11-18 DIAGNOSIS — Z87891 Personal history of nicotine dependence: Secondary | ICD-10-CM

## 2019-11-18 DIAGNOSIS — D72829 Elevated white blood cell count, unspecified: Secondary | ICD-10-CM | POA: Diagnosis present

## 2019-11-18 DIAGNOSIS — N179 Acute kidney failure, unspecified: Secondary | ICD-10-CM | POA: Diagnosis present

## 2019-11-18 DIAGNOSIS — W19XXXA Unspecified fall, initial encounter: Secondary | ICD-10-CM | POA: Diagnosis present

## 2019-11-18 DIAGNOSIS — E1142 Type 2 diabetes mellitus with diabetic polyneuropathy: Secondary | ICD-10-CM | POA: Diagnosis present

## 2019-11-18 DIAGNOSIS — R000 Tachycardia, unspecified: Secondary | ICD-10-CM | POA: Diagnosis not present

## 2019-11-18 DIAGNOSIS — T380X5A Adverse effect of glucocorticoids and synthetic analogues, initial encounter: Secondary | ICD-10-CM | POA: Diagnosis present

## 2019-11-18 DIAGNOSIS — I350 Nonrheumatic aortic (valve) stenosis: Secondary | ICD-10-CM | POA: Diagnosis present

## 2019-11-18 DIAGNOSIS — Z79899 Other long term (current) drug therapy: Secondary | ICD-10-CM

## 2019-11-18 DIAGNOSIS — K219 Gastro-esophageal reflux disease without esophagitis: Secondary | ICD-10-CM | POA: Diagnosis present

## 2019-11-18 DIAGNOSIS — G4733 Obstructive sleep apnea (adult) (pediatric): Secondary | ICD-10-CM | POA: Diagnosis present

## 2019-11-18 DIAGNOSIS — S2231XA Fracture of one rib, right side, initial encounter for closed fracture: Secondary | ICD-10-CM | POA: Diagnosis present

## 2019-11-18 DIAGNOSIS — Z8249 Family history of ischemic heart disease and other diseases of the circulatory system: Secondary | ICD-10-CM

## 2019-11-18 DIAGNOSIS — I4510 Unspecified right bundle-branch block: Secondary | ICD-10-CM | POA: Diagnosis present

## 2019-11-18 LAB — CBC WITH DIFFERENTIAL/PLATELET
Abs Immature Granulocytes: 0.14 10*3/uL — ABNORMAL HIGH (ref 0.00–0.07)
Basophils Absolute: 0.1 10*3/uL (ref 0.0–0.1)
Basophils Relative: 1 %
Eosinophils Absolute: 0.5 10*3/uL (ref 0.0–0.5)
Eosinophils Relative: 3 %
HCT: 36.9 % (ref 36.0–46.0)
Hemoglobin: 11.3 g/dL — ABNORMAL LOW (ref 12.0–15.0)
Immature Granulocytes: 1 %
Lymphocytes Relative: 13 %
Lymphs Abs: 1.7 10*3/uL (ref 0.7–4.0)
MCH: 28.3 pg (ref 26.0–34.0)
MCHC: 30.6 g/dL (ref 30.0–36.0)
MCV: 92.5 fL (ref 80.0–100.0)
Monocytes Absolute: 1.1 10*3/uL — ABNORMAL HIGH (ref 0.1–1.0)
Monocytes Relative: 8 %
Neutro Abs: 9.9 10*3/uL — ABNORMAL HIGH (ref 1.7–7.7)
Neutrophils Relative %: 74 %
Platelets: 357 10*3/uL (ref 150–400)
RBC: 3.99 MIL/uL (ref 3.87–5.11)
RDW: 16 % — ABNORMAL HIGH (ref 11.5–15.5)
WBC: 13.4 10*3/uL — ABNORMAL HIGH (ref 4.0–10.5)
nRBC: 0 % (ref 0.0–0.2)

## 2019-11-18 LAB — RESPIRATORY PANEL BY RT PCR (FLU A&B, COVID)
Influenza A by PCR: NEGATIVE
Influenza B by PCR: NEGATIVE
SARS Coronavirus 2 by RT PCR: NEGATIVE

## 2019-11-18 LAB — COMPREHENSIVE METABOLIC PANEL
ALT: 16 U/L (ref 0–44)
AST: 22 U/L (ref 15–41)
Albumin: 3.5 g/dL (ref 3.5–5.0)
Alkaline Phosphatase: 73 U/L (ref 38–126)
Anion gap: 13 (ref 5–15)
BUN: 43 mg/dL — ABNORMAL HIGH (ref 8–23)
CO2: 22 mmol/L (ref 22–32)
Calcium: 8.7 mg/dL — ABNORMAL LOW (ref 8.9–10.3)
Chloride: 106 mmol/L (ref 98–111)
Creatinine, Ser: 2.2 mg/dL — ABNORMAL HIGH (ref 0.44–1.00)
GFR calc Af Amer: 26 mL/min — ABNORMAL LOW (ref >60)
GFR calc non Af Amer: 23 mL/min — ABNORMAL LOW (ref >60)
Glucose, Bld: 174 mg/dL — ABNORMAL HIGH (ref 70–99)
Potassium: 4.6 mmol/L (ref 3.5–5.1)
Sodium: 141 mmol/L (ref 135–145)
Total Bilirubin: 0.6 mg/dL (ref 0.3–1.2)
Total Protein: 6.8 g/dL (ref 6.5–8.1)

## 2019-11-18 LAB — HIV ANTIBODY (ROUTINE TESTING W REFLEX): HIV Screen 4th Generation wRfx: NONREACTIVE

## 2019-11-18 LAB — GLUCOSE, CAPILLARY
Glucose-Capillary: 273 mg/dL — ABNORMAL HIGH (ref 70–99)
Glucose-Capillary: 244 mg/dL — ABNORMAL HIGH (ref 70–99)

## 2019-11-18 LAB — LIPASE, BLOOD: Lipase: 22 U/L (ref 11–51)

## 2019-11-18 LAB — TROPONIN I (HIGH SENSITIVITY): Troponin I (High Sensitivity): 16 ng/L (ref <18)

## 2019-11-18 MED ORDER — OXYCODONE-ACETAMINOPHEN 5-325 MG PO TABS
2.0000 | ORAL_TABLET | Freq: Once | ORAL | Status: DC
  Filled 2019-11-18 (×2): qty 2

## 2019-11-18 MED ORDER — IPRATROPIUM BROMIDE 0.02 % IN SOLN
2.5000 mL | RESPIRATORY_TRACT | Status: DC
  Administered 2019-11-18 – 2019-11-19 (×4): 0.5 mg via RESPIRATORY_TRACT
  Filled 2019-11-18 (×4): qty 2.5

## 2019-11-18 MED ORDER — MORPHINE SULFATE (PF) 4 MG/ML IV SOLN
4.0000 mg | INTRAVENOUS | Status: DC | PRN
  Administered 2019-11-18 – 2019-11-19 (×4): 4 mg via INTRAMUSCULAR
  Filled 2019-11-18 (×4): qty 1

## 2019-11-18 MED ORDER — ALBUTEROL SULFATE (2.5 MG/3ML) 0.083% IN NEBU: 2.5000 mg | INHALATION_SOLUTION | RESPIRATORY_TRACT | Status: DC | PRN

## 2019-11-18 MED ORDER — CYCLOBENZAPRINE HCL 10 MG PO TABS
5.0000 mg | ORAL_TABLET | Freq: Once | ORAL | Status: AC
  Administered 2019-11-18: 14:00:00 5 mg via ORAL
  Filled 2019-11-18: qty 1

## 2019-11-18 MED ORDER — METHYLPREDNISOLONE SODIUM SUCC 125 MG IJ SOLR
80.0000 mg | Freq: Once | INTRAMUSCULAR | Status: AC
  Administered 2019-11-18: 80 mg via INTRAVENOUS
  Filled 2019-11-18: qty 2

## 2019-11-18 MED ORDER — INSULIN ASPART 100 UNIT/ML ~~LOC~~ SOLN
0.0000 [IU] | Freq: Three times a day (TID) | SUBCUTANEOUS | Status: DC
  Administered 2019-11-18: 5 [IU] via SUBCUTANEOUS
  Administered 2019-11-19 (×2): 3 [IU] via SUBCUTANEOUS
  Administered 2019-11-19: 5 [IU] via SUBCUTANEOUS
  Administered 2019-11-20: 3 [IU] via SUBCUTANEOUS
  Administered 2019-11-20: 2 [IU] via SUBCUTANEOUS
  Administered 2019-11-20 – 2019-11-21 (×3): 3 [IU] via SUBCUTANEOUS
  Administered 2019-11-21: 7 [IU] via SUBCUTANEOUS
  Administered 2019-11-22: 5 [IU] via SUBCUTANEOUS
  Administered 2019-11-22: 11:00:00 3 [IU] via SUBCUTANEOUS
  Administered 2019-11-22: 2 [IU] via SUBCUTANEOUS
  Administered 2019-11-23: 17:00:00 3 [IU] via SUBCUTANEOUS
  Administered 2019-11-23: 1 [IU] via SUBCUTANEOUS
  Administered 2019-11-23: 2 [IU] via SUBCUTANEOUS
  Administered 2019-11-24: 12:00:00 7 [IU] via SUBCUTANEOUS
  Filled 2019-11-18 (×17): qty 1

## 2019-11-18 MED ORDER — INSULIN ASPART 100 UNIT/ML ~~LOC~~ SOLN
0.0000 [IU] | Freq: Every day | SUBCUTANEOUS | Status: DC
  Administered 2019-11-18: 2 [IU] via SUBCUTANEOUS
  Administered 2019-11-19: 3 [IU] via SUBCUTANEOUS
  Administered 2019-11-20: 20:00:00 5 [IU] via SUBCUTANEOUS
  Administered 2019-11-21 – 2019-11-22 (×2): 3 [IU] via SUBCUTANEOUS
  Filled 2019-11-18 (×5): qty 1

## 2019-11-18 MED ORDER — SODIUM CHLORIDE 0.9 % IV BOLUS
500.0000 mL | Freq: Once | INTRAVENOUS | Status: AC
  Administered 2019-11-18: 500 mL via INTRAVENOUS

## 2019-11-18 MED ORDER — METOPROLOL TARTRATE 5 MG/5ML IV SOLN
2.5000 mg | INTRAVENOUS | Status: DC | PRN
  Administered 2019-11-18 – 2019-11-23 (×3): 2.5 mg via INTRAVENOUS
  Filled 2019-11-18 (×3): qty 5

## 2019-11-18 MED ORDER — SODIUM CHLORIDE 0.9% FLUSH
10.0000 mL | Freq: Two times a day (BID) | INTRAVENOUS | Status: DC
  Administered 2019-11-19 – 2019-11-24 (×8): 10 mL via INTRAVENOUS

## 2019-11-18 MED ORDER — DM-GUAIFENESIN ER 30-600 MG PO TB12
1.0000 | ORAL_TABLET | Freq: Two times a day (BID) | ORAL | Status: DC
  Administered 2019-11-18 – 2019-11-24 (×12): 1 via ORAL
  Filled 2019-11-18 (×13): qty 1

## 2019-11-18 MED ORDER — SODIUM CHLORIDE 0.9 % IV SOLN: INTRAVENOUS | Status: DC

## 2019-11-18 MED ORDER — METHYLPREDNISOLONE SODIUM SUCC 125 MG IJ SOLR
60.0000 mg | Freq: Two times a day (BID) | INTRAMUSCULAR | Status: DC
  Administered 2019-11-18 – 2019-11-21 (×6): 60 mg via INTRAVENOUS
  Filled 2019-11-18 (×6): qty 2

## 2019-11-18 MED ORDER — METHOCARBAMOL 500 MG PO TABS
500.0000 mg | ORAL_TABLET | Freq: Three times a day (TID) | ORAL | Status: DC | PRN
  Filled 2019-11-18: qty 1

## 2019-11-18 MED ORDER — FENTANYL CITRATE (PF) 100 MCG/2ML IJ SOLN: 12.5000 ug | INTRAMUSCULAR | Status: DC | PRN

## 2019-11-18 MED ORDER — IPRATROPIUM-ALBUTEROL 0.5-2.5 (3) MG/3ML IN SOLN
3.0000 mL | Freq: Once | RESPIRATORY_TRACT | Status: AC
  Administered 2019-11-18: 14:00:00 3 mL via RESPIRATORY_TRACT
  Filled 2019-11-18: qty 3

## 2019-11-18 MED ORDER — HYDRALAZINE HCL 25 MG PO TABS: 25.0000 mg | ORAL_TABLET | Freq: Three times a day (TID) | ORAL | Status: DC | PRN

## 2019-11-18 MED ORDER — ONDANSETRON HCL 4 MG/2ML IJ SOLN: 4.0000 mg | Freq: Three times a day (TID) | INTRAMUSCULAR | Status: DC | PRN

## 2019-11-18 MED ORDER — IPRATROPIUM-ALBUTEROL 0.5-2.5 (3) MG/3ML IN SOLN
3.0000 mL | Freq: Once | RESPIRATORY_TRACT | Status: AC
  Administered 2019-11-18: 3 mL via RESPIRATORY_TRACT
  Filled 2019-11-18: qty 3

## 2019-11-18 NOTE — ED Notes (Signed)
This Rn attempted to provide report to assigned room 238. This Rn spoke with Archivist, pt unable to be transferred at this time due to pending COVID results.Charge  RN Nira Conn made aware.

## 2019-11-18 NOTE — H&P (Signed)
History and Physical    [name] [name] [id number] DOB: 11/09/1950 DOA: 11/18/2019  Referring MD/NP/PA:   PCP: Tracie Harrier, MD   Patient coming from:  The patient is coming from home.  At baseline, pt is independent for most of ADL.        Chief Complaint: SOB  HPI: [name] [name] is a 69 y.o. female with medical history significant of hypertension, hyperlipidemia, diabetes mellitus, asthma, TIA, GERD, gout, depression, OSA, pulmonary fibrosis, anemia, chronic pain, aortic valve stenosis, CKD stage IIIa, who presents with shortness breath.  Pt had a mechanical fall causing rib fracture on 11/11/2019. She states that she still has bilateral lower rib cage pain, which is constant, moderate, nonradiating, preventing her from taking deep breaths. She is taking percocet with little help. She states that she has worsening shortness of breath. She has mild dry cough and wheezing.  Patient does not have nausea vomiting, diarrhea, abdominal pain of symptoms of UTI or unilateral weakness.  ED Course: pt was found to have WBC 13.4, troponin 13-->16, negative RVP for Covid test, worsening renal function, temperature normal, blood pressure 113/77, tachycardia, oxygen saturation 98% on room air, RR 18.  Chest x-ray negative. Pt is place on med-surg bed for obs.   CT of chest and abd/pelvis: 1. Rib fractures of varying ages bilaterally with acute/subacute fractures of left and right anterolateral ribs, associated with chest wall contusion or small hematoma along the right chest wall as described. 2. No signs of pneumothorax. 3. Contusion or atelectasis in the right mid chest in the upper lobe along the fissure with basilar atelectasis bilaterally. 4. No evidence of acute traumatic injury involving the solid organs of the abdomen or pelvis. 5. Pancreatic head calcifications may be indicative of prior pancreatitis. New areas of low attenuation in the pancreas have developed since previous imaging  studies. 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 Correlate with any history of pancreatitis and consider short interval follow-up after resolution of acute symptoms with pancreatic protocol CT or MRI as possible. 6. Extensive colonic diverticulosis. 7. Extensive atherosclerotic calcification of the thoracic aorta and branch vessels. 8. Mild engorgement of central pulmonary vasculature, raising the possibility of pulmonary arterial hypertension. A  Review of Systems:   General: no fevers, chills, no body weight gain, has fatigue HEENT: no blurry vision, hearing changes or sore throat Respiratory: has dyspnea, coughing, wheezing CV: has chest wall and rib cage pain, no palpitations GI: no nausea, vomiting, abdominal pain, diarrhea, constipation GU: no dysuria, burning on urination, increased urinary frequency, hematuria  Ext: no leg edema Neuro: no unilateral weakness, numbness, or tingling, no vision change or hearing loss Skin:  has bruise over bilateral breasts and lower chest wall MSK: No muscle spasm, no deformity, no limitation of range of movement in spin Heme: No easy bruising.  Travel history: No recent long distant travel.  Allergy:  Allergies  Allergen Reactions  . Acetaminophen-Codeine Other (See Comments)    Other reaction(s): Unknown   . Codeine Nausea And Vomiting  . Furosemide Other (See Comments)  . Penicillin V Potassium     Other reaction(s): Unknown  . Quinine     Other reaction(s): Unknown  . Tramadol     Other reaction(s): Unknown  . Tylenol [Acetaminophen]     Tylenol-Codeine: Dizziness    Past Medical History:  Diagnosis Date  . Anemia   . Asthma   . Chronic airway obstruction ([place])   .  Diabetes mellitus without complication ([place])   . GERD (gastroesophageal reflux disease)   . History of adenomatous polyp of colon   . History of bone density study   . Hyperlipidemia   .  Hypertension   . Idiopathic peripheral neuropathy   . Morbid obesity ([place])   . Osteoporosis   . Plantar fascial fibromatosis   . Pulmonary fibrosis ([place])   . Sleep apnea    Hypersomnia with sleep apnea  . TIA (transient ischemic attack)     Past Surgical History:  Procedure Laterality Date  . ABDOMINAL HYSTERECTOMY    . BACK SURGERY    . BLADDER SUSPENSION    . CESAREAN SECTION    . COLONOSCOPY    . DILATION AND CURETTAGE OF UTERUS    . ESOPHAGOGASTRODUODENOSCOPY    . LUMBAR LAMINECTOMY/DECOMPRESSION MICRODISCECTOMY N/A 10/15/2016   Procedure: L3-L5 Laminectomy ;  Surgeon: Deetta Perla, MD;  Location: [facility] ORS;  Service: Neurosurgery;  Laterality: N/A;  . LUMBAR WOUND DEBRIDEMENT N/A 11/05/2016   Procedure: LUMBAR WOUND DEBRIDEMENT;  Surgeon: Deetta Perla, MD;  Location: [facility] ORS;  Service: Neurosurgery;  Laterality: N/A;  . TUBAL LIGATION      Social History:  reports that she has quit smoking. She has never used smokeless tobacco. She reports that she does not drink alcohol or use drugs.  Family History:  Family History  Problem Relation Age of Onset  . Stroke Mother   . Heart attack Mother   . Breast cancer Sister 75  . Stroke Sister   . Heart attack Sister   . Breast cancer Cousin        maternal side 60's     Prior to Admission medications   Medication Sig Start Date End Date Taking? Authorizing Provider  acetaminophen (TYLENOL) 650 MG CR tablet Take 650 mg by mouth daily.    [provider]  albuterol (VENTOLIN HFA) 108 (90 Base) MCG/ACT inhaler SMARTSIG:2 Puff(s) By Mouth Every 6 Hours PRN 05/21/19   [provider]  allopurinol (ZYLOPRIM) 100 MG tablet Take 100 mg by mouth daily.    [provider]  budesonide-formoterol (SYMBICORT) 160-4.5 MCG/ACT inhaler Inhale 2 puffs into the lungs 2 (two) times daily.    [provider]  clotrimazole-betamethasone (LOTRISONE) cream Apply 1 application topically 2 (two) times daily.    [provider]  cyclobenzaprine (FLEXERIL) 5 MG tablet Take 5 mg by mouth 2 (two) times daily as needed for muscle spasms.    [provider]  DULoxetine (CYMBALTA) 30 MG capsule Take 30 mg by mouth daily. 07/17/19   [provider]  esomeprazole (NEXIUM) 40 MG capsule Take 40 mg by mouth daily at 12 noon.    [provider]  gabapentin (NEURONTIN) 300 MG capsule Take 300 mg by mouth daily.    [provider]  glimepiride (AMARYL) 2 MG tablet Take 2 mg by mouth every morning. 07/23/19   [provider]  hydrochlorothiazide (MICROZIDE) 12.5 MG capsule Take by mouth.    [provider]  levalbuterol Penne Lash) 1.25 MG/3ML nebulizer solution Inhale 1.25 mg into the lungs every 4 (four) hours as needed for wheezing or shortness of breath.  08/16/16 08/16/17  [provider]  losartan (COZAAR) 50 MG tablet Take 50 mg by mouth daily. 07/28/19   [provider]  metFORMIN (GLUCOPHAGE) 500 MG tablet Take 500 mg by mouth 2 (two) times daily with a meal.    [provider]  methocarbamol (ROBAXIN) 500 MG  tablet Take 1 tablet (500 mg total) by mouth every 8 (eight) hours as needed for muscle spasms. 11/06/16   Deetta Perla, MD  mometasone-formoterol [facility]) 100-5 MCG/ACT AERO Inhale 2 puffs into the lungs 2 (two) times daily.    [provider]  nystatin cream (MYCOSTATIN) Apply 1 application topically 2 (two) times daily. Patient taking differently: Apply 1 application topically daily as needed for dry skin (rash).  07/31/15   Triplett, Cari B, FNP  oxyCODONE (OXY IR/ROXICODONE) 5 MG immediate release tablet Take 1-2 tablets (5-10 mg total) by mouth every 4 (four) hours as needed for moderate pain (5 mg for 4-6/10 pain, 10 mg for 7-8/10 pain). 10/15/16   Deetta Perla, MD  oxyCODONE (ROXICODONE) 5 MG immediate release tablet Take 1 tablet (5 mg total) by mouth every 4 (four) hours as needed for severe pain. 11/08/16   Abd-El-Barr,  Rogue Jury, MD  oxyCODONE (ROXICODONE) 5 MG immediate release tablet Take 1 tablet (5 mg total) by mouth every 4 (four) hours as needed for severe pain. 11/14/19   Earleen Newport, MD  predniSONE (STERAPRED UNI-PAK 21 TAB) 10 MG (21) TBPK tablet Take 6 tablets on day 1, take 5 tablets on day 2, take 4 tablets on day 3, take 3 tablets on day 4, take 2 tablets on day 5, take 1 tablet on day 6 05/12/17   Laban Emperor, PA-C  senna (SENOKOT) 8.6 MG TABS tablet Take 1 tablet (8.6 mg total) by mouth 2 (two) times daily. 10/16/16   Deetta Perla, MD  simvastatin (ZOCOR) 20 MG tablet Take 20 mg by mouth daily.    [provider]  triamterene-hydrochlorothiazide (DYAZIDE) 37.5-25 MG capsule Take 1 capsule by mouth daily.    [provider]    Physical Exam: Vitals:   11/18/19 1754 11/18/19 1807 11/18/19 1816 11/18/19 1831  BP:  113/68 110/68 113/70  Pulse:  (!) 118 (!) 117 100  Resp: (!) 24     Temp:      TempSrc:      SpO2: 96% 98% 98% 100%  Weight:      Height:       General: Not in acute distress HEENT:       Eyes: PERRL, EOMI, no scleral icterus.       ENT: No discharge from the ears and nose, no pharynx injection, no tonsillar enlargement.        Neck: No JVD, no bruit, no mass felt. Heme: No neck lymph node enlargement. Cardiac: S1/S2, RRR, No murmurs, No gallops or rubs. Respiratory: Has wheezing bilaterally GI: Soft, nondistended, nontender, no rebound pain, no organomegaly, BS present. GU: No hematuria Ext: No pitting leg edema bilaterally. 2+DP/PT pulse bilaterally. Musculoskeletal: No joint deformities, No joint redness or warmth, no limitation of ROM in spin. Skin: has bruise over bilateral breasts and lower chest wall Neuro: Alert, oriented X3, cranial nerves II-XII grossly intact, moves all extremities normally. Psych: Patient is not psychotic, no suicidal or hemocidal ideation.  Labs on Admission: I have personally reviewed following labs and imaging  studies  CBC: Recent Labs  Lab 11/14/19 1040 11/18/19 1005  WBC 18.0* 13.4*  NEUTROABS 11.3* 9.9*  HGB 13.2 11.3*  HCT 42.2 36.9  MCV 90.9 92.5  PLT 386 [id number]   Basic Metabolic Panel: Recent Labs  Lab 11/14/19 1040 11/18/19 1005  NA 140 141  K 4.4 4.6  CL 104 106  CO2 22 22  GLUCOSE 209* 174*  BUN 28* 43*  CREATININE 1.52*  2.20*  CALCIUM 9.2 8.7*   GFR: Estimated Creatinine Clearance: 27.2 mL/min (A) (by C-G formula based on SCr of 2.2 mg/dL (H)). Liver Function Tests: Recent Labs  Lab 11/14/19 1040 11/18/19 1005  AST 23 22  ALT 19 16  ALKPHOS 82 73  BILITOT 1.0 0.6  PROT 7.2 6.8  ALBUMIN 3.8 3.5   No results for input(s): LIPASE, AMYLASE in the last 168 hours. No results for input(s): AMMONIA in the last 168 hours. Coagulation Profile: No results for input(s): INR, PROTIME in the last 168 hours. Cardiac Enzymes: No results for input(s): CKTOTAL, CKMB, CKMBINDEX, TROPONINI in the last 168 hours. BNP (last 3 results) No results for input(s): PROBNP in the last 8760 hours. HbA1C: No results for input(s): HGBA1C in the last 72 hours. CBG: Recent Labs  Lab 11/18/19 1652  GLUCAP 273*   Lipid Profile: No results for input(s): CHOL, HDL, LDLCALC, TRIG, CHOLHDL, LDLDIRECT in the last 72 hours. Thyroid Function Tests: No results for input(s): TSH, T4TOTAL, FREET4, T3FREE, THYROIDAB in the last 72 hours. Anemia Panel: No results for input(s): VITAMINB12, FOLATE, FERRITIN, TIBC, IRON, RETICCTPCT in the last 72 hours. Urine analysis:    Component Value Date/Time   COLORURINE YELLOW (A) 10/09/2016 0929   APPEARANCEUR CLEAR (A) 10/09/2016 0929   LABSPEC 1.009 10/09/2016 0929   PHURINE 5.0 10/09/2016 0929   GLUCOSEU NEGATIVE 10/09/2016 0929   HGBUR SMALL (A) 10/09/2016 0929   BILIRUBINUR NEGATIVE 10/09/2016 0929   KETONESUR NEGATIVE 10/09/2016 0929   PROTEINUR NEGATIVE 10/09/2016 0929   NITRITE NEGATIVE 10/09/2016 0929   LEUKOCYTESUR NEGATIVE 10/09/2016  0929   Sepsis Labs: @LABRCNTIP (procalcitonin:4,lacticidven:4) ) Recent Results (from the past 240 hour(s))  Respiratory Panel by RT PCR (Flu A&B, Covid) - Nasopharyngeal Swab     Status: None   Collection Time: 11/18/19  2:07 PM   Specimen: Nasopharyngeal Swab  Result Value Ref Range Status   SARS Coronavirus 2 by RT PCR NEGATIVE NEGATIVE Final    Comment: (NOTE) SARS-CoV-2 target nucleic acids are NOT DETECTED. The SARS-CoV-2 RNA is generally detectable in upper respiratoy specimens during the acute phase of infection. The lowest concentration of SARS-CoV-2 viral copies this assay can detect is 131 copies/mL. A negative result does not preclude SARS-Cov-2 infection and should not be used as the sole basis for treatment or other patient management decisions. A negative result may occur with  improper specimen collection/handling, submission of specimen other than nasopharyngeal swab, presence of viral mutation(s) within the areas targeted by this assay, and inadequate number of viral copies (<131 copies/mL). A negative result must be combined with clinical observations, patient history, and epidemiological information. The expected result is Negative. Fact Sheet for Patients:  PinkCheek.be Fact Sheet for Healthcare Providers:  GravelBags.it This test is not yet ap proved or cleared by the [place] FDA and  has been authorized for detection and/or diagnosis of SARS-CoV-2 by FDA under an Emergency Use Authorization (EUA). This EUA will remain  in effect (meaning this test can be used) for the duration of the COVID-19 declaration under Section 564(b)(1) of the Act, 21 U.S.C. section 360bbb-3(b)(1), unless the authorization is terminated or revoked sooner.    Influenza A by PCR NEGATIVE NEGATIVE Final   Influenza B by PCR NEGATIVE NEGATIVE Final    Comment: (NOTE) The Xpert Xpress SARS-CoV-2/FLU/RSV assay is intended as  an aid in  the diagnosis of influenza from Nasopharyngeal swab specimens and  should not be used as a sole basis for treatment. Nasal  washings and  aspirates are unacceptable for Xpert Xpress SARS-CoV-2/FLU/RSV  testing. Fact Sheet for Patients: PinkCheek.be Fact Sheet for Healthcare Providers: GravelBags.it This test is not yet approved or cleared by the [place] FDA and  has been authorized for detection and/or diagnosis of SARS-CoV-2 by  FDA under an Emergency Use Authorization (EUA). This EUA will remain  in effect (meaning this test can be used) for the duration of the  Covid-19 declaration under Section 564(b)(1) of the Act, 21  U.S.C. section 360bbb-3(b)(1), unless the authorization is  terminated or revoked. Performed at [facility], [address] [place], [place] [address]      Radiological Exams on Admission: CT ABDOMEN PELVIS WO CONTRAST  Result Date: 11/18/2019 CLINICAL DATA:  Abdominal trauma, persistent chest and epigastric pain status post ground level fall. Fall occurred on 11/11/2019, evaluated on that date, presents with persistent pain. EXAM: CT CHEST, ABDOMEN AND PELVIS WITHOUT CONTRAST TECHNIQUE: Multidetector CT imaging of the chest, abdomen and pelvis was performed following the standard protocol without IV contrast. COMPARISON:  Chest CT of 06/16/2018 FINDINGS: CT CHEST FINDINGS Cardiovascular: Calcified atherosclerotic changes throughout the thoracic aorta. Aorta is nonaneurysmal. Limited assessment of vascular structures due to lack of intravenous contrast. The mild engorgement of central pulmonary vasculature to 2.7 cm. Signs of coronary artery calcification of left and right coronary circulation. Heart size normal without pericardial effusion. Mediastinum/Nodes: No signs of mediastinal hematoma. No signs of thoracic inlet adenopathy. No signs of axillary lymphadenopathy. No mediastinal  adenopathy. Esophagus grossly normal. Lungs/Pleura: Mild centrilobular emphysema at the lung apices. Bandlike opacity in the right upper lobe along the minor fissure, associated with adjacent rib fractures, see below. No signs of pneumothorax. Airways are patent. No signs of dense consolidation or evidence of pleural effusion. Scattered atelectatic changes at the lung bases. Musculoskeletal: Right fourth anterior rib fracture also with right fifth anterior rib fracture, sixth and seventh anterior rib and nondisplaced fracture of right eighth rib as well. Stranding along the right chest wall may represent small contusion or hematoma. This is deep to the pectoralis and associated with the intercostal musculature. Visualize scapula and clavicle on both right and left are unremarkable. Sternum is intact. Signs of previous rib fracture also demonstrated on the left but with suspected acute nondisplaced fracture of left fourth rib also with potential nondisplaced fractures of fifth, sixth, seventh and eighth ribs on the left. Spinal alignment is maintained with signs of degenerative change. Costochondral elements are intact. CT ABDOMEN PELVIS FINDINGS Hepatobiliary: Mildly limited assessment of upper abdominal contents due to respiratory motion. No signs of focal lesion or evidence of hepatic trauma to include perihepatic hematoma. Gallbladder is normal. No signs of biliary ductal dilation. Pancreas: Suggestion of cystic lesions in the pancreas (image 27, series 2) 13 mm area along the anterior pancreas with low-attenuation. Low-density in the body of the pancreas with another area that may represent a cystic lesion measuring as much as 13 mm. This is at the junction of body and tail. Pancreatic contours of changed slightly since the prior study but again there is respiratory motion in this area. No significant peripancreatic stranding. Calcification, coarse calcification in the head of the pancreas has developed since  20/11. There may be an adjacent low-attenuation area a near this calcification. The uncinate process is small but unchanged since 20/11. Spleen: Spleen is normal size without focal lesion. Adrenals/Urinary Tract: Adrenal glands are normal. Kidneys with mild scarring bilaterally. No signs of hydronephrosis or nephrolithiasis. Stomach/Bowel: Normal  appendix. No signs of acute bowel process. Extensive colonic diverticulosis. Vascular/Lymphatic: Signs of extensive atherosclerotic calcification of the abdominal aorta extending into branch vessels. No signs of adenopathy in the upper abdomen or in the retroperitoneum. No signs of pelvic lymphadenopathy. Reproductive: Post hysterectomy. No signs of adnexal mass. Other: Small infraumbilical hernia contains fat. Small umbilical hernia contains fat. Musculoskeletal: Extensive spinal degenerative changes. No acute bone process. L4 compression fracture is unchanged and associated with surrounding degenerative changes. Signs of laminectomy at L4-5 similar to prior studies. IMPRESSION: 1. Rib fractures of varying ages bilaterally with acute/subacute fractures of left and right anterolateral ribs, associated with chest wall contusion or small hematoma along the right chest wall as described. 2. No signs of pneumothorax. 3. Contusion or atelectasis in the right mid chest in the upper lobe along the fissure with basilar atelectasis bilaterally. 4. No evidence of acute traumatic injury involving the solid organs of the abdomen or pelvis. 5. Pancreatic head calcifications may be indicative of prior pancreatitis. New areas of low attenuation in the pancreas have developed since previous imaging studies. 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 Correlate with any history of pancreatitis and consider short interval follow-up after resolution of acute symptoms with pancreatic protocol CT or MRI as possible. 6. Extensive  colonic diverticulosis. 7. Extensive atherosclerotic calcification of the thoracic aorta and branch vessels. 8. Mild engorgement of central pulmonary vasculature, raising the possibility of pulmonary arterial hypertension. A Aortic Atherosclerosis (ICD10-I70.0) and Emphysema (ICD10-J43.9). Electronically Signed   By: Zetta Bills M.D.   On: 11/18/2019 12:30   DG Chest 2 View  Result Date: 11/18/2019 CLINICAL DATA:  Shortness of breath EXAM: CHEST - 2 VIEW COMPARISON:  November 14, 2019 FINDINGS: Lungs are clear. Heart is upper normal in size with pulmonary vascularity normal. No adenopathy. There is aortic atherosclerosis. No bone lesions. IMPRESSION: Lungs clear. Heart upper normal in size. Aortic Atherosclerosis (ICD10-I70.0). Electronically Signed   By: Lowella Grip III M.D.   On: 11/18/2019 10:56   CT Chest Wo Contrast  Result Date: 11/18/2019 CLINICAL DATA:  Abdominal trauma, persistent chest and epigastric pain status post ground level fall. Fall occurred on 11/11/2019, evaluated on that date, presents with persistent pain. EXAM: CT CHEST, ABDOMEN AND PELVIS WITHOUT CONTRAST TECHNIQUE: Multidetector CT imaging of the chest, abdomen and pelvis was performed following the standard protocol without IV contrast. COMPARISON:  Chest CT of 06/16/2018 FINDINGS: CT CHEST FINDINGS Cardiovascular: Calcified atherosclerotic changes throughout the thoracic aorta. Aorta is nonaneurysmal. Limited assessment of vascular structures due to lack of intravenous contrast. The mild engorgement of central pulmonary vasculature to 2.7 cm. Signs of coronary artery calcification of left and right coronary circulation. Heart size normal without pericardial effusion. Mediastinum/Nodes: No signs of mediastinal hematoma. No signs of thoracic inlet adenopathy. No signs of axillary lymphadenopathy. No mediastinal adenopathy. Esophagus grossly normal. Lungs/Pleura: Mild centrilobular emphysema at the lung apices. Bandlike  opacity in the right upper lobe along the minor fissure, associated with adjacent rib fractures, see below. No signs of pneumothorax. Airways are patent. No signs of dense consolidation or evidence of pleural effusion. Scattered atelectatic changes at the lung bases. Musculoskeletal: Right fourth anterior rib fracture also with right fifth anterior rib fracture, sixth and seventh anterior rib and nondisplaced fracture of right eighth rib as well. Stranding along the right chest wall may represent small contusion or hematoma. This is deep to the pectoralis and associated with the intercostal  musculature. Visualize scapula and clavicle on both right and left are unremarkable. Sternum is intact. Signs of previous rib fracture also demonstrated on the left but with suspected acute nondisplaced fracture of left fourth rib also with potential nondisplaced fractures of fifth, sixth, seventh and eighth ribs on the left. Spinal alignment is maintained with signs of degenerative change. Costochondral elements are intact. CT ABDOMEN PELVIS FINDINGS Hepatobiliary: Mildly limited assessment of upper abdominal contents due to respiratory motion. No signs of focal lesion or evidence of hepatic trauma to include perihepatic hematoma. Gallbladder is normal. No signs of biliary ductal dilation. Pancreas: Suggestion of cystic lesions in the pancreas (image 27, series 2) 13 mm area along the anterior pancreas with low-attenuation. Low-density in the body of the pancreas with another area that may represent a cystic lesion measuring as much as 13 mm. This is at the junction of body and tail. Pancreatic contours of changed slightly since the prior study but again there is respiratory motion in this area. No significant peripancreatic stranding. Calcification, coarse calcification in the head of the pancreas has developed since 20/11. There may be an adjacent low-attenuation area a near this calcification. The uncinate process is small  but unchanged since 20/11. Spleen: Spleen is normal size without focal lesion. Adrenals/Urinary Tract: Adrenal glands are normal. Kidneys with mild scarring bilaterally. No signs of hydronephrosis or nephrolithiasis. Stomach/Bowel: Normal appendix. No signs of acute bowel process. Extensive colonic diverticulosis. Vascular/Lymphatic: Signs of extensive atherosclerotic calcification of the abdominal aorta extending into branch vessels. No signs of adenopathy in the upper abdomen or in the retroperitoneum. No signs of pelvic lymphadenopathy. Reproductive: Post hysterectomy. No signs of adnexal mass. Other: Small infraumbilical hernia contains fat. Small umbilical hernia contains fat. Musculoskeletal: Extensive spinal degenerative changes. No acute bone process. L4 compression fracture is unchanged and associated with surrounding degenerative changes. Signs of laminectomy at L4-5 similar to prior studies. IMPRESSION: 1. Rib fractures of varying ages bilaterally with acute/subacute fractures of left and right anterolateral ribs, associated with chest wall contusion or small hematoma along the right chest wall as described. 2. No signs of pneumothorax. 3. Contusion or atelectasis in the right mid chest in the upper lobe along the fissure with basilar atelectasis bilaterally. 4. No evidence of acute traumatic injury involving the solid organs of the abdomen or pelvis. 5. Pancreatic head calcifications may be indicative of prior pancreatitis. New areas of low attenuation in the pancreas have developed since previous imaging studies. 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 Correlate with any history of pancreatitis and consider short interval follow-up after resolution of acute symptoms with pancreatic protocol CT or MRI as possible. 6. Extensive colonic diverticulosis. 7. Extensive atherosclerotic calcification of the thoracic aorta and branch vessels.  8. Mild engorgement of central pulmonary vasculature, raising the possibility of pulmonary arterial hypertension. A Aortic Atherosclerosis (ICD10-I70.0) and Emphysema (ICD10-J43.9). Electronically Signed   By: Zetta Bills M.D.   On: 11/18/2019 12:30     EKG: Independently reviewed.  Sinus rhythm, QTC 477, RAD, right bundle blockage, early R wave progression  Assessment/Plan Principal Problem:   Asthma exacerbation Active Problems:   Benign essential hypertension   Gastroesophageal reflux disease without esophagitis   Fall   Closed rib fracture   Pulmonary fibrosis (HCC)   Acute renal failure superimposed on stage 3a chronic kidney disease (HCC)   Asthma exacerbation in the setting of pulmonary fibrosis: CXR negative. RVP negative for covid 19. -  place on med-surg bed for obs -Bronchodilators -As needed Mucinex, -Solu-Medrol 60 mg twice daily  HTN:  -hold cozarr due to worsening renal function -hydralazine prn  Gastroesophageal reflux disease without esophagitis: -PPI  Fall: -pt/OT  Closed rib fracture: -prn oxycodone, fentanyl, Robaxin -Incentive spirometry  Acute renal failure superimposed on stage 3a chronic kidney disease ([place]): Baseline Cre is 1.02-1.5.  Her creatinine is 2.20, BUN 42 on admission. Likely due to dehydration and continuation of ARB - IVF: 500 cc of NS, then 75 cc/h - Follow up renal function by BMP - Avoid using renal toxic medications, hypotension and contrast dye (or carefully use) - Hold cozarr  Incidental findings of CT scan: CT scan showed pancreatic head calcifications may be indicative of prior pancreatitis. New areas of low attenuation in the pancreas have developed since previous imaging studies. 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  -this must be f/u with PCP -will check lipase    DVT ppx: SCD Code Status: Full code Family Communication: None at bed side.     Disposition Plan:  Anticipate discharge back to previous home environment Consults called:   Admission status: Med-surg bed for obs     Date of Service 11/18/2019    [facility]   If 7PM-7AM, please contact night-coverage [email or web address] 11/18/2019, 7:05 PM

## 2019-11-18 NOTE — Progress Notes (Signed)
Ch visited Pt in response to [place]. Pt was sitting up watching TV when Ch entered the room. After checking in on how Pt was doing, Ch asked Pt if she had requested prayer. Pt shared that she had missed a step and fallen while visiting grand children, and had broken multiple ribs from the fall. Pt wanted prayer for body to hal. Ch prayed with Pt, informed Pt about availability of chaplains, and left.   11/18/19 2047  Clinical Encounter Type  Visited With Patient  Visit Type Initial;Spiritual support  Referral From Patient  Consult/Referral To Chaplain  Spiritual Encounters  Spiritual Needs Prayer;Emotional  Stress Factors  Patient Stress Factors Health changes;Loss of control

## 2019-11-18 NOTE — ED Notes (Signed)
COVID swab resulted as NEGATIVE. This RN called floor RN Drue Novel to notified results and let RN know pt will be transported to assigned room 238.

## 2019-11-18 NOTE — ED Notes (Signed)
Pt back from CT

## 2019-11-18 NOTE — ED Notes (Addendum)
RN Nira Conn unable to establish PIV.   EDP Quentin Cornwall at bedside.

## 2019-11-18 NOTE — Progress Notes (Signed)
Pt's MEWS is RED with a score of 4 due to HR 137 and RR 24 while ambulating to BR.  SpO2 96% RA.  RRT notified, but not activated. Dr. Blaine Hamper informed and ordered metoprolol prn.

## 2019-11-18 NOTE — ED Notes (Signed)
EKG reviewed by EDP Quentin Cornwall.

## 2019-11-18 NOTE — ED Triage Notes (Signed)
Pt from home via [facility]. Per EMS, pt fell on 11/11/2019 seen at [facility] and sent home with Rx percocet, pt st "It does not help". Pt arrives with c/o increased SHOB and rib cage pain. Pt presents to ED with hx of asthma, inhaler at bedside.

## 2019-11-18 NOTE — ED Provider Notes (Signed)
[facility] Emergency Department Provider Note    First MD Initiated Contact with Patient 11/18/19 [phone]     (approximate)  I have reviewed the triage vital signs and the nursing notes.   HISTORY  Chief Complaint Shortness of Breath (Right/rib pain)    HPI [name] [name] is a 69 y.o. female says a past medical history as listed below not on home oxygen with recent visit to the ER after mechanical fall causing right rib fracture he presents to the ER for shortness of breath and persistent chest discomfort.  Is not had any interval injury or fall.  States that pain is been moderate and not improved with oral pain medications.  Denies any measured fevers.    Past Medical History:  Diagnosis Date  . Anemia   . Asthma   . Chronic airway obstruction ([place])   . Diabetes mellitus without complication ([place])   . GERD (gastroesophageal reflux disease)   . History of adenomatous polyp of colon   . History of bone density study   . Hyperlipidemia   . Hypertension   . Idiopathic peripheral neuropathy   . Morbid obesity ([place])   . Osteoporosis   . Plantar fascial fibromatosis   . Pulmonary fibrosis ([place])   . Sleep apnea    Hypersomnia with sleep apnea  . TIA (transient ischemic attack)    Family History  Problem Relation Age of Onset  . Stroke Mother   . Heart attack Mother   . Breast cancer Sister 58  . Stroke Sister   . Heart attack Sister   . Breast cancer Cousin        maternal side 55's   Past Surgical History:  Procedure Laterality Date  . ABDOMINAL HYSTERECTOMY    . BACK SURGERY    . BLADDER SUSPENSION    . CESAREAN SECTION    . COLONOSCOPY    . DILATION AND CURETTAGE OF UTERUS    . ESOPHAGOGASTRODUODENOSCOPY    . LUMBAR LAMINECTOMY/DECOMPRESSION MICRODISCECTOMY N/A 10/15/2016   Procedure: L3-L5 Laminectomy ;  Surgeon: Deetta Perla, MD;  Location: [facility] ORS;  Service: Neurosurgery;  Laterality: N/A;  . LUMBAR WOUND DEBRIDEMENT N/A 11/05/2016    Procedure: LUMBAR WOUND DEBRIDEMENT;  Surgeon: Deetta Perla, MD;  Location: [facility] ORS;  Service: Neurosurgery;  Laterality: N/A;  . TUBAL LIGATION     Patient Active Problem List   Diagnosis Date Noted  . Fall 11/18/2019  . Closed rib fracture 11/18/2019  . Pulmonary fibrosis ([place])   . Asthma exacerbation   . Acute renal failure superimposed on stage 3a chronic kidney disease ([place])   . Chronic pain syndrome 09/15/2018  . AVD (aortic valve disease) 10/07/2017  . Benign essential hypertension 06/17/2017  . Nonrheumatic aortic valve insufficiency 06/17/2017  . CKD (chronic kidney disease) stage 3, GFR 30-59 ml/min 05/20/2017  . Wound dehiscence 11/05/2016  . Lumbar stenosis 10/15/2016  . Renal impairment 10/05/2016  . Right lumbar radiculopathy 10/05/2016  . Complete tear of right rotator cuff 11/28/2015  . Rotator cuff arthropathy, right 11/28/2015  . Low vitamin D level 02/28/2015  . Gastroesophageal reflux disease without esophagitis 12/14/2014  . Morbid obesity with BMI of 40.0-44.9, adult ([place]) 10/06/2014  . Aortic stenosis 09/14/2014  . DOE (dyspnea on exertion) 09/08/2014  . History of adenomatous polyp of colon 09/08/2014  . DM2 (diabetes mellitus, type 2) ([place]) 03/22/2014  . Hyperlipidemia 03/22/2014      Prior to Admission medications   Medication Sig Start Date  End Date Taking? Authorizing Provider  allopurinol (ZYLOPRIM) 100 MG tablet Take 100 mg by mouth daily.   Yes [provider]  budesonide-formoterol (SYMBICORT) 160-4.5 MCG/ACT inhaler Inhale 2 puffs into the lungs 2 (two) times daily.   Yes [provider]  cyclobenzaprine (FLEXERIL) 5 MG tablet Take 5 mg by mouth 2 (two) times daily as needed for muscle spasms.   Yes [provider]  DULoxetine (CYMBALTA) 30 MG capsule Take 30 mg by mouth daily. 07/17/19  Yes [provider]  esomeprazole (NEXIUM) 40 MG capsule Take 40 mg by mouth daily.    Yes [provider]   glimepiride (AMARYL) 2 MG tablet Take 2 mg by mouth every morning. 07/23/19  Yes [provider]  losartan (COZAAR) 50 MG tablet Take 50 mg by mouth daily. 07/28/19  Yes [provider]  metFORMIN (GLUCOPHAGE) 500 MG tablet Take 500 mg by mouth 2 (two) times daily with a meal.   Yes [provider]  oxyCODONE (ROXICODONE) 5 MG immediate release tablet Take 1 tablet (5 mg total) by mouth every 4 (four) hours as needed for severe pain. Patient taking differently: Take 5 mg by mouth 2 (two) times daily as needed for severe pain.  11/14/19  Yes Earleen Newport, MD  predniSONE (DELTASONE) 5 MG tablet Take 5 mg by mouth daily. 11/07/19  Yes [provider]  simvastatin (ZOCOR) 20 MG tablet Take 20 mg by mouth daily.   Yes [provider]    Allergies Acetaminophen-codeine, Codeine, Furosemide, Penicillin v potassium, Quinine, Tramadol, and Tylenol [acetaminophen]    Social History Social History   Tobacco Use  . Smoking status: Former Research scientist (life sciences)  . Smokeless tobacco: Never Used  Substance Use Topics  . Alcohol use: No  . Drug use: No    Review of Systems Patient denies headaches, rhinorrhea, blurry vision, numbness, shortness of breath, chest pain, edema, cough, abdominal pain, nausea, vomiting, diarrhea, dysuria, fevers, rashes or hallucinations unless otherwise stated above in HPI. ____________________________________________   PHYSICAL EXAM:  VITAL SIGNS: Vitals:   11/18/19 0934 11/18/19 1015  BP:    Pulse:  (!) 105  Resp:    Temp:    SpO2: 98% 98%    Constitutional: Alert and oriented. Uncomfortable appearing Eyes: Conjunctivae are normal.  Head: Atraumatic. Nose: No congestion/rhinnorhea. Mouth/Throat: Mucous membranes are moist.   Neck: No stridor. Painless ROM.  Cardiovascular: Normal rate, regular rhythm. Grossly normal heart sounds.  Good peripheral circulation. Respiratory: Normal respiratory effort.  No retractions.  Lungs CTAB. Gastrointestinal: Soft and nontender. No distention. No abdominal bruits. No CVA tenderness. Genitourinary:  Musculoskeletal: TTP of anterior chest wall.  No crepitus.No lower extremity tenderness nor edema.  No joint effusions. Neurologic:  Normal speech and language. No gross focal neurologic deficits are appreciated. No facial droop Skin:  Skin is warm, dry and intact. No rash noted. Psychiatric: Mood and affect are normal. Speech and behavior are normal.  ____________________________________________   LABS (all labs ordered are listed, but only abnormal results are displayed)  Results for orders placed or performed during the hospital encounter of 11/18/19 (from the past 24 hour(s))  CBC with Differential/Platelet     Status: Abnormal   Collection Time: 11/18/19 10:05 AM  Result Value Ref Range   WBC 13.4 (H) 4.0 - 10.5 K/uL   RBC 3.99 3.87 - 5.11 MIL/uL   Hemoglobin 11.3 (L) 12.0 - 15.0 g/dL   HCT 36.9 36.0 - 46.0 %   MCV 92.5 80.0 -  100.0 fL   MCH 28.3 26.0 - 34.0 pg   MCHC 30.6 30.0 - 36.0 g/dL   RDW 16.0 (H) 11.5 - 15.5 %   Platelets 357 150 - 400 K/uL   nRBC 0.0 0.0 - 0.2 %   Neutrophils Relative % 74 %   Neutro Abs 9.9 (H) 1.7 - 7.7 K/uL   Lymphocytes Relative 13 %   Lymphs Abs 1.7 0.7 - 4.0 K/uL   Monocytes Relative 8 %   Monocytes Absolute 1.1 (H) 0.1 - 1.0 K/uL   Eosinophils Relative 3 %   Eosinophils Absolute 0.5 0.0 - 0.5 K/uL   Basophils Relative 1 %   Basophils Absolute 0.1 0.0 - 0.1 K/uL   Immature Granulocytes 1 %   Abs Immature Granulocytes 0.14 (H) 0.00 - 0.07 K/uL  Comprehensive metabolic panel     Status: Abnormal   Collection Time: 11/18/19 10:05 AM  Result Value Ref Range   Sodium 141 135 - 145 mmol/L   Potassium 4.6 3.5 - 5.1 mmol/L   Chloride 106 98 - 111 mmol/L   CO2 22 22 - 32 mmol/L   Glucose, Bld 174 (H) 70 - 99 mg/dL   BUN 43 (H) 8 - 23 mg/dL   Creatinine, Ser 2.20 (H) 0.44 - 1.00 mg/dL   Calcium 8.7 (L) 8.9 - 10.3 mg/dL    Total Protein 6.8 6.5 - 8.1 g/dL   Albumin 3.5 3.5 - 5.0 g/dL   AST 22 15 - 41 U/L   ALT 16 0 - 44 U/L   Alkaline Phosphatase 73 38 - 126 U/L   Total Bilirubin 0.6 0.3 - 1.2 mg/dL   GFR calc non Af Amer 23 (L) >60 mL/min   GFR calc Af Amer 26 (L) >60 mL/min   Anion gap 13 5 - 15  Troponin I (High Sensitivity)     Status: None   Collection Time: 11/18/19 10:05 AM  Result Value Ref Range   Troponin I (High Sensitivity) 16 <18 ng/L   ____________________________________________  EKG My review and personal interpretation at Time: 15:05   Indication: chest wall pain  Rate: 110  Rhythm: sinus Axis: normal Other: rbbb, nonspecific st abn, abnml ekg ____________________________________________  RADIOLOGY  I personally reviewed all radiographic images ordered to evaluate for the above acute complaints and reviewed radiology reports and findings.  These findings were personally discussed with the patient.  Please see medical record for radiology report.  ____________________________________________   PROCEDURES  Procedure(s) performed:  Procedures    Critical Care performed: no ____________________________________________   INITIAL IMPRESSION / ASSESSMENT AND PLAN / ED COURSE  Pertinent labs & imaging results that were available during my care of the patient were reviewed by me and considered in my medical decision making (see chart for details).   DDX: Fracture, contusion, hemorrhage, pneumonia, pneumothorax, COPD,   KIERAN HOWDYSHELL is a 69 y.o. who presents to the ED with symptoms as described above.  Patient with significant pain.  Mildly tachycardic.  Blood.  Does show evidence of AKI.  Will give IV fluids will give pain medication.  Will order CT imaging for the but differential.  Clinical Course as of Nov 18 1507  Wed Nov 18, 2019  1310 MCV: 92.5 [PR]  1313 MCH: 28.3 [PR]  [id number] Patient with multiple bilateral rib fractures as well is associated hematoma.  She is  otherwise hemodynamically stable.  Given her frailty intractable pain comorbidities I do believe she should be observed in the hospital for  further respiratory support.  Given her wheezing will give her Solu-Medrol as well as continue with duo nebs which seem to have helped.   [PR]    Clinical Course User Index [PR] Merlyn Lot, MD    The patient was evaluated in Emergency Department today for the symptoms described in the history of present illness. He/she was evaluated in the context of the global COVID-19 pandemic, which necessitated consideration that the patient might be at risk for infection with the SARS-CoV-2 virus that causes COVID-19. Institutional protocols and algorithms that pertain to the evaluation of patients at risk for COVID-19 are in a state of rapid change based on information released by regulatory bodies including the CDC and federal and state organizations. These policies and algorithms were followed during the patient's care in the ED.  As part of my medical decision making, I reviewed the following data within the [place] notes reviewed and incorporated, Labs reviewed, notes from prior ED visits and  Controlled Substance Database   ____________________________________________   FINAL CLINICAL IMPRESSION(S) / ED DIAGNOSES  Final diagnoses:  Closed fracture of multiple ribs of both sides, initial encounter  SOB (shortness of breath)  AKI (acute kidney injury) ([place])      NEW MEDICATIONS STARTED DURING THIS VISIT:  New Prescriptions   No medications on file     Note:  This document was prepared using Dragon voice recognition software and may include unintentional dictation errors.    Merlyn Lot, MD 11/18/19 [phone]

## 2019-11-18 NOTE — ED Notes (Signed)
Pt to CT at this time.

## 2019-11-18 NOTE — ED Notes (Signed)
Pt repositioned and provided with lunch and fluid by this RN.

## 2019-11-18 NOTE — ED Notes (Signed)
IV team at bedside 

## 2019-11-18 NOTE — Plan of Care (Signed)
Pt arrived to room 238 at 1630 on RA for c/o SOB with multiple rib fractures s/p fall on 11/11/19.  Oriented to safety features of the room, call light functions, phone and personal belongings within reach.   Problem: Education: Goal: Knowledge of General Education information will improve Description: Including pain rating scale, medication(s)/side effects and non-pharmacologic comfort measures Outcome: Progressing   Problem: Health Behavior/Discharge Planning: Goal: Ability to manage health-related needs will improve Outcome: Progressing   Problem: Clinical Measurements: Goal: Ability to maintain clinical measurements within normal limits will improve Outcome: Progressing Goal: Will remain free from infection Outcome: Progressing Goal: Diagnostic test results will improve Outcome: Progressing Goal: Respiratory complications will improve Outcome: Progressing Goal: Cardiovascular complication will be avoided Outcome: Progressing   Problem: Activity: Goal: Risk for activity intolerance will decrease Outcome: Progressing   Problem: Nutrition: Goal: Adequate nutrition will be maintained Outcome: Progressing   Problem: Coping: Goal: Level of anxiety will decrease Outcome: Progressing   Problem: Elimination: Goal: Will not experience complications related to bowel motility Outcome: Progressing Goal: Will not experience complications related to urinary retention Outcome: Progressing   Problem: Pain Managment: Goal: General experience of comfort will improve Outcome: Progressing   Problem: Safety: Goal: Ability to remain free from injury will improve Outcome: Progressing   Problem: Skin Integrity: Goal: Risk for impaired skin integrity will decrease Outcome: Progressing   Problem: Activity: Goal: Ability to tolerate increased activity will improve Outcome: Progressing   Problem: Clinical Measurements: Goal: Ability to maintain a body temperature in the normal range  will improve Outcome: Progressing   Problem: Respiratory: Goal: Ability to maintain adequate ventilation will improve Outcome: Progressing Goal: Ability to maintain a clear airway will improve Outcome: Progressing

## 2019-11-18 NOTE — ED Notes (Signed)
ED Provider at bedside. 

## 2019-11-18 NOTE — ED Notes (Signed)
This Rn attempted to establish a PIV due to first PIV infiltrated. UA to establish PIV. RN Nira Conn will assist this RN.

## 2019-11-19 DIAGNOSIS — R000 Tachycardia, unspecified: Secondary | ICD-10-CM | POA: Diagnosis not present

## 2019-11-19 DIAGNOSIS — I10 Essential (primary) hypertension: Secondary | ICD-10-CM | POA: Diagnosis not present

## 2019-11-19 DIAGNOSIS — J45901 Unspecified asthma with (acute) exacerbation: Principal | ICD-10-CM

## 2019-11-19 DIAGNOSIS — E785 Hyperlipidemia, unspecified: Secondary | ICD-10-CM | POA: Diagnosis present

## 2019-11-19 DIAGNOSIS — S2243XD Multiple fractures of ribs, bilateral, subsequent encounter for fracture with routine healing: Secondary | ICD-10-CM

## 2019-11-19 DIAGNOSIS — E860 Dehydration: Secondary | ICD-10-CM | POA: Diagnosis present

## 2019-11-19 DIAGNOSIS — Y92009 Unspecified place in unspecified non-institutional (private) residence as the place of occurrence of the external cause: Secondary | ICD-10-CM | POA: Diagnosis not present

## 2019-11-19 DIAGNOSIS — I129 Hypertensive chronic kidney disease with stage 1 through stage 4 chronic kidney disease, or unspecified chronic kidney disease: Secondary | ICD-10-CM | POA: Diagnosis present

## 2019-11-19 DIAGNOSIS — J8410 Pulmonary fibrosis, unspecified: Secondary | ICD-10-CM | POA: Diagnosis present

## 2019-11-19 DIAGNOSIS — Z6841 Body Mass Index (BMI) 40.0 and over, adult: Secondary | ICD-10-CM | POA: Diagnosis not present

## 2019-11-19 DIAGNOSIS — K219 Gastro-esophageal reflux disease without esophagitis: Secondary | ICD-10-CM | POA: Diagnosis present

## 2019-11-19 DIAGNOSIS — D631 Anemia in chronic kidney disease: Secondary | ICD-10-CM | POA: Diagnosis present

## 2019-11-19 DIAGNOSIS — D509 Iron deficiency anemia, unspecified: Secondary | ICD-10-CM | POA: Diagnosis present

## 2019-11-19 DIAGNOSIS — D72829 Elevated white blood cell count, unspecified: Secondary | ICD-10-CM | POA: Diagnosis present

## 2019-11-19 DIAGNOSIS — N179 Acute kidney failure, unspecified: Secondary | ICD-10-CM | POA: Diagnosis present

## 2019-11-19 DIAGNOSIS — G4733 Obstructive sleep apnea (adult) (pediatric): Secondary | ICD-10-CM | POA: Diagnosis present

## 2019-11-19 DIAGNOSIS — S2231XA Fracture of one rib, right side, initial encounter for closed fracture: Secondary | ICD-10-CM | POA: Diagnosis present

## 2019-11-19 DIAGNOSIS — E875 Hyperkalemia: Secondary | ICD-10-CM | POA: Diagnosis not present

## 2019-11-19 DIAGNOSIS — N1831 Chronic kidney disease, stage 3a: Secondary | ICD-10-CM | POA: Diagnosis present

## 2019-11-19 DIAGNOSIS — W19XXXA Unspecified fall, initial encounter: Secondary | ICD-10-CM | POA: Diagnosis present

## 2019-11-19 DIAGNOSIS — J9811 Atelectasis: Secondary | ICD-10-CM | POA: Diagnosis present

## 2019-11-19 DIAGNOSIS — J449 Chronic obstructive pulmonary disease, unspecified: Secondary | ICD-10-CM | POA: Diagnosis present

## 2019-11-19 DIAGNOSIS — R0602 Shortness of breath: Secondary | ICD-10-CM | POA: Diagnosis present

## 2019-11-19 DIAGNOSIS — Z20822 Contact with and (suspected) exposure to covid-19: Secondary | ICD-10-CM | POA: Diagnosis present

## 2019-11-19 DIAGNOSIS — T380X5A Adverse effect of glucocorticoids and synthetic analogues, initial encounter: Secondary | ICD-10-CM | POA: Diagnosis present

## 2019-11-19 DIAGNOSIS — E1122 Type 2 diabetes mellitus with diabetic chronic kidney disease: Secondary | ICD-10-CM | POA: Diagnosis present

## 2019-11-19 DIAGNOSIS — S2243XA Multiple fractures of ribs, bilateral, initial encounter for closed fracture: Secondary | ICD-10-CM | POA: Diagnosis present

## 2019-11-19 DIAGNOSIS — G4710 Hypersomnia, unspecified: Secondary | ICD-10-CM | POA: Diagnosis present

## 2019-11-19 LAB — BASIC METABOLIC PANEL
Anion gap: 7 (ref 5–15)
BUN: 47 mg/dL — ABNORMAL HIGH (ref 8–23)
CO2: 23 mmol/L (ref 22–32)
Calcium: 8.2 mg/dL — ABNORMAL LOW (ref 8.9–10.3)
Chloride: 107 mmol/L (ref 98–111)
Creatinine, Ser: 1.55 mg/dL — ABNORMAL HIGH (ref 0.44–1.00)
GFR calc Af Amer: 40 mL/min — ABNORMAL LOW (ref >60)
GFR calc non Af Amer: 35 mL/min — ABNORMAL LOW (ref >60)
Glucose, Bld: 217 mg/dL — ABNORMAL HIGH (ref 70–99)
Potassium: 5.1 mmol/L (ref 3.5–5.1)
Sodium: 137 mmol/L (ref 135–145)

## 2019-11-19 LAB — CBC
HCT: 32.2 % — ABNORMAL LOW (ref 36.0–46.0)
Hemoglobin: 10 g/dL — ABNORMAL LOW (ref 12.0–15.0)
MCH: 28.5 pg (ref 26.0–34.0)
MCHC: 31.1 g/dL (ref 30.0–36.0)
MCV: 91.7 fL (ref 80.0–100.0)
Platelets: 318 10*3/uL (ref 150–400)
RBC: 3.51 MIL/uL — ABNORMAL LOW (ref 3.87–5.11)
RDW: 15.8 % — ABNORMAL HIGH (ref 11.5–15.5)
WBC: 13.5 10*3/uL — ABNORMAL HIGH (ref 4.0–10.5)
nRBC: 0 % (ref 0.0–0.2)

## 2019-11-19 LAB — GLUCOSE, CAPILLARY
Glucose-Capillary: 214 mg/dL — ABNORMAL HIGH (ref 70–99)
Glucose-Capillary: 226 mg/dL — ABNORMAL HIGH (ref 70–99)
Glucose-Capillary: 254 mg/dL — ABNORMAL HIGH (ref 70–99)
Glucose-Capillary: 264 mg/dL — ABNORMAL HIGH (ref 70–99)

## 2019-11-19 LAB — HEMOGLOBIN A1C
Hgb A1c MFr Bld: 8.4 % — ABNORMAL HIGH (ref 4.8–5.6)
Mean Plasma Glucose: 194 mg/dL

## 2019-11-19 MED ORDER — ALBUTEROL SULFATE (2.5 MG/3ML) 0.083% IN NEBU: 2.5000 mg | INHALATION_SOLUTION | RESPIRATORY_TRACT | Status: DC | PRN

## 2019-11-19 MED ORDER — IPRATROPIUM-ALBUTEROL 0.5-2.5 (3) MG/3ML IN SOLN
3.0000 mL | Freq: Four times a day (QID) | RESPIRATORY_TRACT | Status: DC
  Administered 2019-11-19 – 2019-11-20 (×3): 3 mL via RESPIRATORY_TRACT
  Filled 2019-11-19 (×3): qty 3

## 2019-11-19 MED ORDER — MOMETASONE FURO-FORMOTEROL FUM 200-5 MCG/ACT IN AERO
2.0000 | INHALATION_SPRAY | Freq: Two times a day (BID) | RESPIRATORY_TRACT | Status: DC
  Administered 2019-11-19 – 2019-11-24 (×11): 2 via RESPIRATORY_TRACT
  Filled 2019-11-19: qty 8.8

## 2019-11-19 MED ORDER — MORPHINE SULFATE (PF) 4 MG/ML IV SOLN
4.0000 mg | INTRAVENOUS | Status: DC | PRN
  Administered 2019-11-19 – 2019-11-21 (×4): 4 mg via INTRAVENOUS
  Filled 2019-11-19 (×4): qty 1

## 2019-11-19 MED ORDER — ALLOPURINOL 100 MG PO TABS
100.0000 mg | ORAL_TABLET | Freq: Every day | ORAL | Status: DC
  Administered 2019-11-19 – 2019-11-24 (×6): 100 mg via ORAL
  Filled 2019-11-19 (×6): qty 1

## 2019-11-19 MED ORDER — INSULIN DETEMIR 100 UNIT/ML ~~LOC~~ SOLN
5.0000 [IU] | Freq: Two times a day (BID) | SUBCUTANEOUS | Status: DC
  Administered 2019-11-19 – 2019-11-20 (×3): 5 [IU] via SUBCUTANEOUS
  Filled 2019-11-19 (×4): qty 0.05

## 2019-11-19 MED ORDER — PANTOPRAZOLE SODIUM 40 MG PO TBEC
40.0000 mg | DELAYED_RELEASE_TABLET | Freq: Every day | ORAL | Status: DC
  Administered 2019-11-19 – 2019-11-24 (×6): 40 mg via ORAL
  Filled 2019-11-19 (×6): qty 1

## 2019-11-19 MED ORDER — HEPARIN SODIUM (PORCINE) 5000 UNIT/ML IJ SOLN
5000.0000 [IU] | Freq: Three times a day (TID) | INTRAMUSCULAR | Status: DC
  Administered 2019-11-19 – 2019-11-24 (×14): 5000 [IU] via SUBCUTANEOUS
  Filled 2019-11-19 (×15): qty 1

## 2019-11-19 MED ORDER — DULOXETINE HCL 30 MG PO CPEP
30.0000 mg | ORAL_CAPSULE | Freq: Every day | ORAL | Status: DC
Start: 2019-11-19 — End: 2019-11-24
  Administered 2019-11-19 – 2019-11-24 (×6): 30 mg via ORAL
  Filled 2019-11-19 (×6): qty 1

## 2019-11-19 MED ORDER — IPRATROPIUM-ALBUTEROL 0.5-2.5 (3) MG/3ML IN SOLN: 3.0000 mL | Freq: Three times a day (TID) | RESPIRATORY_TRACT | Status: DC

## 2019-11-19 MED ORDER — OXYCODONE HCL 5 MG PO TABS
5.0000 mg | ORAL_TABLET | Freq: Two times a day (BID) | ORAL | Status: DC | PRN
  Administered 2019-11-20 – 2019-11-24 (×7): 5 mg via ORAL
  Filled 2019-11-19 (×7): qty 1

## 2019-11-19 MED ORDER — SIMVASTATIN 20 MG PO TABS
20.0000 mg | ORAL_TABLET | Freq: Every day | ORAL | Status: DC
  Administered 2019-11-19 – 2019-11-23 (×5): 20 mg via ORAL
  Filled 2019-11-19 (×5): qty 1

## 2019-11-19 NOTE — Evaluation (Signed)
Physical Therapy Evaluation Patient Details Name: [name] [name] MRN: [id number] DOB: [birth year] Today's Date: 11/19/2019   History of Present Illness  69 y.o. female with medical history significant of hypertension, hyperlipidemia, diabetes mellitus, asthma, TIA, GERD, gout, depression, OSA, pulmonary fibrosis, anemia, chronic pain, aortic valve stenosis, lumbar stenosis, CKD stage IIIa, who presents with worsening shortness breath. Pt also experienced a fall 11/11/2019, discharged home, returned 11/14/2019 due to pain. This admission imaging showed "Rib fractures of varying ages bilaterally with acute/subacutefractures of left and right anterolateral ribs, associated withchest wall contusion or small hematoma along the right chest wall as described."    Clinical Impression  Pt alert, in bed, reported 6-7/10 rib pain at rest. Pt stated she lives in an apartment, daughter lives right across the street and son is also available if needed. Family assists with dressing, driving, and errands as needed, pt most often utilizes [facility] for ambulation in home.   The patient demonstrated bed mobility with use of bed rail, HOB elevated and handheld assist. Pt requested handheld assist, may not be required. The patient sat EOB with fair balance, MD in room to assess pt. Sit <> stand from several surfaces with RW, CGA/supervision, verbal cues for hand placement to increase safety. The pt ambulated ~33ft to commode, and ~38ft to recliner, CGA with RW. The patient was able to utilize commode with supervision. No unsteadiness noted with mobility, HR elevated to 130s, about 2 minutes of seated rest break needed to decrease to 110s, pt denies symptoms.  Overall the patient demonstrated deficits (see "PT Problem List") that impede the patient's functional abilities, safety, and mobility and would benefit from skilled PT intervention. Recommendation is HHPT with intermittent supervision.      Follow Up Recommendations Home health  PT;Supervision - Intermittent    Equipment Recommendations  Other (comment)(Pt has RW at home)    Recommendations for Other Services OT consult     Precautions / Restrictions Precautions Precautions: Fall Precaution Comments: watch HR/O2 sats Restrictions Weight Bearing Restrictions: No      Mobility  Bed Mobility Overal bed mobility: Needs Assistance Bed Mobility: Supine to Sit     Supine to sit: HOB elevated;Min assist Sit to supine: Supervision   General bed mobility comments: Pt requested handheld assist, provided, but may not be required  Transfers Overall transfer level: Needs assistance Equipment used: Rolling walker (2 wheeled) Transfers: Sit to/from Stand Sit to Stand: Min guard;Supervision         General transfer comment: from bed surface, BSC, and recliner. Occasionaly verbal cues for hand placement.  Ambulation/Gait Ambulation/Gait assistance: Min guard Gait Distance (Feet): 30 Feet(~15 to commode, ~52ft to recliner) Assistive device: Rolling walker (2 wheeled)          Stairs            Wheelchair Mobility    Modified Rankin (Stroke Patients Only)       Balance Overall balance assessment: Needs assistance Sitting-balance support: No upper extremity supported;Feet supported Sitting balance-Leahy Scale: Fair     Standing balance support: Bilateral upper extremity supported;Single extremity supported;During functional activity Standing balance-Leahy Scale: Fair                               Pertinent Vitals/Pain Pain Assessment: 0-10 Pain Score: 6  Pain Location: ribs Pain Descriptors / Indicators: Aching Pain Intervention(s): Limited activity within patient's tolerance;Repositioned;Monitored during session    Home Living Family/patient expects to  be discharged to:: Private residence Living Arrangements: Alone Available Help at Discharge: Family;Available 24 hours/day;Available PRN/intermittently(dtr and son  live nearby and could be available 24/7 for short time if needed, per pt) Type of Home: Apartment Home Access: Stairs to enter Entrance Stairs-Rails: None(pt reports she holds onto something but unclear what) Entrance Stairs-Number of Steps: 1+1 Home Layout: One level Home Equipment: Walker - 2 wheels;Cane - single point;Hand held shower head Additional Comments: Information gathered from OT evaluation    Prior Function Level of Independence: Needs assistance   Gait / Transfers Assistance Needed: pt reports ambulating with SPC  ADL's / Homemaking Assistance Needed: Pt reports difficulty with LB dressing sometimes and UB dressing frequently requiring dtr's assist; dtr/son assist with transportation and groceries; pt manages meds with weekly pill organizer  Comments: Pt denies any additional falls in past 12 months     Hand Dominance   Dominant Hand: Right    Extremity/Trunk Assessment   Upper Extremity Assessment Upper Extremity Assessment: Generalized weakness;Defer to OT evaluation    Lower Extremity Assessment Lower Extremity Assessment: Generalized weakness(grossly 4-/5)    Cervical / Trunk Assessment Cervical / Trunk Assessment: Normal  Communication   Communication: No difficulties  Cognition Arousal/Alertness: Awake/alert Behavior During Therapy: WFL for tasks assessed/performed Overall Cognitive Status: Within Functional Limits for tasks assessed                                        General Comments      Exercises Other Exercises Other Exercises: Pt able to utilize [facility] over stand toilet with supervision, as well as pericare with supervision (in standing). Washed hands at sink with fair balance. Other Exercises: HR and spO2 monitored throughout, spO2>91% on room air throughout session, HR in 130s with ambulation/exertion, ~2 minutes to recover to 110s.   Assessment/Plan    PT Assessment Patient needs continued PT services  PT Problem List  Decreased strength;Decreased mobility;Decreased range of motion;Decreased activity tolerance;Decreased balance;Pain;Decreased knowledge of use of DME;Cardiopulmonary status limiting activity;Obesity       PT Treatment Interventions DME instruction;Therapeutic exercise;Gait training;Balance training;Neuromuscular re-education;Stair training;Functional mobility training;Therapeutic activities;Patient/family education    PT Goals (Current goals can be found in the Care Plan section)  Acute Rehab PT Goals Patient Stated Goal: to go home PT Goal Formulation: With patient Time For Goal Achievement: 12/03/19 Potential to Achieve Goals: Good    Frequency Min 2X/week   Barriers to discharge        Co-evaluation               AM-PAC PT "6 Clicks" Mobility  Outcome Measure Help needed turning from your back to your side while in a flat bed without using bedrails?: A Little Help needed moving from lying on your back to sitting on the side of a flat bed without using bedrails?: A Little Help needed moving to and from a bed to a chair (including a wheelchair)?: A Little Help needed standing up from a chair using your arms (e.g., wheelchair or bedside chair)?: A Little Help needed to walk in hospital room?: A Little Help needed climbing 3-5 steps with a railing? : A Lot 6 Click Score: 17    End of Session Equipment Utilized During Treatment: Gait belt Activity Tolerance: Patient tolerated treatment well Patient left: in chair;with chair alarm set;with call bell/phone within reach Nurse Communication: Mobility status PT Visit Diagnosis: Other abnormalities  of gait and mobility (R26.89);Muscle weakness (generalized) (M62.81);Difficulty in walking, not elsewhere classified (R26.2);Pain Pain - Right/Left: Right Pain - part of body: (trunk, due to rib fx)    Time: [id number] PT Time Calculation (min) (ACUTE ONLY): 27 min   Charges:   PT Evaluation $PT Eval Low Complexity: 1 Low PT  Treatments $Therapeutic Exercise: 8-22 mins        Lieutenant Diego PT, DPT 11:49 AM,11/19/19

## 2019-11-19 NOTE — Progress Notes (Signed)
Inpatient Diabetes Program Recommendations  AACE/ADA: New Consensus Statement on Inpatient Glycemic Control   Target Ranges:  Prepandial:   less than 140 mg/dL      Peak postprandial:   less than 180 mg/dL (1-2 hours)      Critically ill patients:  140 - 180 mg/dL   Results for JASBIR, OLESEN (MRN [id number]) as of 11/19/2019 10:16  Ref. Range 11/18/2019 16:52 11/18/2019 21:12 11/19/2019 07:29  Glucose-Capillary Latest Ref Range: 70 - 99 mg/dL 273 (H) 244 (H) 214 (H)   Review of Glycemic Control  Diabetes history: DM2 Outpatient Diabetes medications: Amaryl 2 QAM, Metformin 500 mg BID Current orders for Inpatient glycemic control: Novolog 0-9 units TID with meals, Novolog 0-5 units QHS; Solumedrol 60 mg Q12H  Inpatient Diabetes Program Recommendations:   Insulin-Basal: If steroids are continued, please consider ordering Levemir 5 units BID. Once steroids are tapered, will likely need to decrease or discontinue basal insulin.  Thanks, Barnie Alderman, RN, MSN, CDE Diabetes Coordinator Inpatient Diabetes Program [phone] (Team Pager from 8am to 5pm)

## 2019-11-19 NOTE — Evaluation (Signed)
Occupational Therapy Evaluation Patient Details Name: RENEZMEE [name] MRN: [id number] DOB: [birth year] Today's Date: 11/19/2019    History of Present Illness 69 y.o. female with medical history significant of hypertension, hyperlipidemia, diabetes mellitus, asthma, TIA, GERD, gout, depression, OSA, pulmonary fibrosis, anemia, chronic pain, aortic valve stenosis, lumbar stenosis, CKD stage IIIa, who presents with worsening shortness breath. Pt also experienced a fall 11/11/2019, discharged home, returned 11/14/2019 due to pain. This admission imaging showed "Rib fractures of varying ages bilaterally with acute/subacutefractures of left and right anterolateral ribs, associated withchest wall contusion or small hematoma along the right chest wall as described."   Clinical Impression   Pt seen for OT evaluation this date. Prior to admission, pt required assist from son and daughter for transportation and groceries, some assist for bathing/dressing from daughter, and ambulates with a SPC at baseline. Pt lives by herself in a 1st floor apartment with 1+1 steps to enter and tub/shower. Pt demo's limited shoulder ROM (R worse than L) and pt reports rotator cuff issues, requiring her daughter to assist with ADL. Pt currently requires Min assist for UB and LB bathing and dressing, grooming, CGA to Min A for sit to stand ADL transfers. Pt's HR increases to mid 130's with exertional activity, O2 sats down to 91% on room air. Pt instructed in ECS including PLB, activity pacing, home/routines modifications, work simplification, AE/DME, prioritizing of meaningful occupations, and falls prevention. Pt recovers to >95% O2 and HR under 120 within 2-5 minutes seated. Handout provided. Pt verbalized understanding and would benefit from additional skilled OT services to maximize recall and carryover of learned techniques and facilitate implementation of learned techniques into daily routines. Upon discharge, recommend HHOT services  and 24/7 supervision/assist initially upon return home to maximize safety.      Follow Up Recommendations  Home health OT;Supervision/Assistance - 24 hour(initial 24/7 supervision assist, particularly with ADL mobility and bathing/dressing)    Equipment Recommendations  Tub/shower bench;Other (comment)(reacher)    Recommendations for Other Services       Precautions / Restrictions Precautions Precautions: Fall Precaution Comments: watch HR/O2 sats Restrictions Weight Bearing Restrictions: No      Mobility Bed Mobility Overal bed mobility: Needs Assistance Bed Mobility: Supine to Sit;Sit to Supine     Supine to sit: Min assist;HOB elevated Sit to supine: Supervision   General bed mobility comments: Min A initially, encouraged to use bed rail and with heavy reliance on bed rail pt able to perform; supervision and encouragement to get back to bed  Transfers Overall transfer level: Needs assistance Equipment used: Rolling walker (2 wheeled) Transfers: Sit to/from Stand Sit to Stand: Min guard;Min assist         General transfer comment: CGA for initial transfer with VC for hand/foot placement (pt trying to pull up on RW), Min A for 2nd transfer after ADL mobility and STS bathing tasks.    Balance Overall balance assessment: Needs assistance Sitting-balance support: No upper extremity supported;Feet supported Sitting balance-Leahy Scale: Fair     Standing balance support: Bilateral upper extremity supported;Single extremity supported;During functional activity Standing balance-Leahy Scale: Fair                             ADL either performed or assessed with clinical judgement   ADL Overall ADL's : Needs assistance/impaired     Grooming: Sitting;Minimal assistance Grooming Details (indicate cue type and reason): Min A for washing/deo for underarms 2/2 decreased shoulder ROM  Upper Body Bathing: Sitting;Minimal assistance Upper Body Bathing Details  (indicate cue type and reason): assist to wash underarms and back Min A for washing/deo for underarms 2/2 decreased shoulder ROM Lower Body Bathing: Minimal assistance;Sit to/from stand Lower Body Bathing Details (indicate cue type and reason): Min A for bathing perineal area 2/2 decrease shoulder ROM and body habitus Upper Body Dressing : Sitting;Minimal assistance Upper Body Dressing Details (indicate cue type and reason): Min A for washing/deo for underarms 2/2 decreased shoulder ROM Lower Body Dressing: Sit to/from stand;Minimal assistance   Toilet Transfer: Ambulation;Min guard;Minimal assistance;Regular Materials engineer Details (indicate cue type and reason): VC for hand/foot placement, cues for PLB to recover Toileting- Clothing Manipulation and Hygiene: Sit to/from stand;Minimal assistance Toileting - Clothing Manipulation Details (indicate cue type and reason): Min A for pericare     Functional mobility during ADLs: Min guard;Rolling walker       Vision Baseline Vision/History: Wears glasses Wears Glasses: At all times Patient Visual Report: No change from baseline Vision Assessment?: No apparent visual deficits     Perception     Praxis      Pertinent Vitals/Pain Pain Assessment: 0-10 Pain Score: 7  Pain Location: ribs Pain Descriptors / Indicators: Aching Pain Intervention(s): Limited activity within patient's tolerance;Monitored during session;Repositioned;Premedicated before session     Hand Dominance Right   Extremity/Trunk Assessment Upper Extremity Assessment Upper Extremity Assessment: Generalized weakness(impaired shoulder ROM bilat, R worse than L, hx of rotator cuff issues per pt, approx 45* or less shoulder flexion, abduction; elbow flex/ext grossly 4-/5, grip 4/5)   Lower Extremity Assessment Lower Extremity Assessment: Generalized weakness       Communication Communication Communication: No difficulties   Cognition Arousal/Alertness:  Awake/alert Behavior During Therapy: WFL for tasks assessed/performed Overall Cognitive Status: Within Functional Limits for tasks assessed                                     General Comments       Exercises Other Exercises Other Exercises: Pt instructed in energy conservation strategies with handout provided to support recall and carryover Other Exercises: With ADL mobility and sit to stand bathing and dressing tasks, pt HR increases to mid 130's and O2 sats on room air down to 91%. With rest breaks and cues for PLB, O2 improves to >95% and HR down to 119 requiring 2-35min to recover   Shoulder Instructions      Home Living Family/patient expects to be discharged to:: Private residence Living Arrangements: Alone Available Help at Discharge: Family;Available 24 hours/day;Available PRN/intermittently(dtr and son live nearby and could be available 24/7 for short time if needed, per pt) Type of Home: Apartment Home Access: Stairs to enter [employer] of Steps: 1+1 Entrance Stairs-Rails: None(pt reports she holds onto something but unclear what) Home Layout: One level     Bathroom Shower/Tub: Tub/shower unit;Curtain   Biochemist, clinical: Standard     Home Equipment: Environmental consultant - 2 wheels;Cane - single point;Hand held shower head          Prior Functioning/Environment Level of Independence: Needs assistance  Gait / Transfers Assistance Needed: pt reports ambulating with SPC ADL's / Homemaking Assistance Needed: Pt reports difficulty with LB dressing sometimes and UB dressing frequently requiring dtr's assist; dtr/son assist with transportation and groceries; pt manages meds with weekly pill organizer   Comments: Pt denies any additional falls in past 12 months  OT Problem List: Decreased strength;Decreased range of motion;Pain;Cardiopulmonary status limiting activity;Decreased activity tolerance;Impaired balance (sitting and/or standing);Decreased  knowledge of use of DME or AE;Impaired UE functional use      OT Treatment/Interventions: Self-care/ADL training;Therapeutic exercise;Therapeutic activities;Energy conservation;DME and/or AE instruction;Patient/family education;Balance training    OT Goals(Current goals can be found in the care plan section) Acute Rehab OT Goals Patient Stated Goal: to get better and go home OT Goal Formulation: With patient Time For Goal Achievement: 12/03/19 Potential to Achieve Goals: Good ADL Goals Pt Will Perform Lower Body Dressing: with adaptive equipment;with min guard assist;sit to/from stand Pt Will Transfer to Toilet: with supervision;ambulating;regular height toilet(LRAD for amb) Additional ADL Goal #1: Pt will verbalize plan to implement at least 2 learned energy conservation strategies to maxmize safety/indep with ADL at home. Additional ADL Goal #2: Pt will verbalize plan to implement at least 1 learned falls prevention strategy to maximize safety/indep with ADL and ADL mobility at home.  OT Frequency: Min 2X/week   Barriers to D/C:            Co-evaluation              AM-PAC OT "6 Clicks" Daily Activity     Outcome Measure Help from another person eating meals?: None Help from another person taking care of personal grooming?: A Little Help from another person toileting, which includes using toliet, bedpan, or urinal?: A Little Help from another person bathing (including washing, rinsing, drying)?: A Little Help from another person to put on and taking off regular upper body clothing?: A Little Help from another person to put on and taking off regular lower body clothing?: A Little 6 Click Score: 19   End of Session Equipment Utilized During Treatment: Gait belt;Rolling walker  Activity Tolerance: Patient tolerated treatment well Patient left: in bed;with call bell/phone within reach;with bed alarm set;with SCD's reapplied  OT Visit Diagnosis: Unsteadiness on feet  (R26.81);History of falling (Z91.81);Muscle weakness (generalized) (M62.81);Pain Pain - Right/Left: Right Pain - part of body: (ribs)                Time: [id number] OT Time Calculation (min): 45 min Charges:  OT General Charges $OT Visit: 1 Visit OT Evaluation $OT Eval Moderate Complexity: 1 Mod OT Treatments $Self Care/Home Management : 23-37 mins  Jeni Salles, MPH, MS, OTR/L ascom [phone] 11/19/19, 10:29 AM

## 2019-11-19 NOTE — Progress Notes (Signed)
PROGRESS NOTE    ZOHIE MINCKLER  [id number] DOB: 1951/08/19 DOA: 11/18/2019  PCP: Tracie Harrier, MD    LOS - 0   Brief Narrative:  ANALEYAH AISPURO is a 69 y.o. female with medical history significant of hypertension, hyperlipidemia, diabetes mellitus, asthma, TIA, GERD, gout, depression, OSA, pulmonary fibrosis, anemia, chronic pain, aortic valve stenosis, CKD stage IIIa, who presented to the ED on 2/10 with worsening shortness breath, dry cough and wheezing.  Of note, patient has bilateral lower rib fractures after a mechanical fall at home on 2/3 and continues with significant pain on inspirations.  In the ED, vitals stable.  Labs showed leukocytosis.  Covid-19 test negative.  CXR clear of infiltrates.  Patient admitted to hospitalist service with acute asthma exacerbation.   Subjective 2/11: Patient seen this AM, sitting up edge of bed.  When working with OT earlier, patient's HR increased to 130's and sustained for approx 5 minutes or more before coming down.  Patient continues to feel quite short of breath.  States she notes improvement after bronchodilator treatments, but wheezing comes back too soon.  Denies fever/chills.   Assessment & Plan:   Principal Problem:   Asthma exacerbation Active Problems:   Benign essential hypertension   Gastroesophageal reflux disease without esophagitis   Fall   Closed rib fracture   Pulmonary fibrosis (HCC)   Acute renal failure superimposed on stage 3a chronic kidney disease (HCC)   Acute Asthma Exacerbation in setting of underlying Pulmonary Fibrosis --continue Solu-medrol 60 mg IV BID  --scheduled Duonebs q6h --PRN albuterol nebs q3h --Mucinex BID --cont Dulera (sub'd for home Symbicort) --possibly can transition to oral steroid tomorrow if further improved --supplemental oxygen if needed to keep sat > 90%  Hypertension - chronic, stable  --Hold losartan due to AKI --Hydralazine prn  Gastroesophageal reflux disease without  esophagitis -continue home PPI  Mechanical Fall -PT/OT  Closed rib fractures (due to fall 11/11/19) --prn oxycodone, fentanyl, Robaxin --Incentive spirometry  Acute renal failure superimposed on stage 3a chronic kidney disease ([place]):  Baseline Cre is 1.02-1.5.  Creatinine 2.20 on admission, likely due to dehydration and continuation of ARB - improving with IV hydration --follow BMP daily --Avoid renal toxic agents and hypotension --Hold losartan  Incidental findings of CT scan: CT scan showed pancreatic head calcifications may be indicative of prior pancreatitis. New areas of low attenuation in the pancreas have developed since previous imaging studies. 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  -this must be f/u with PCP -will check lipase    DVT prophylaxis: heparin   Code Status: Full Code  Family Communication: none at bedside   Disposition Plan:  Expect d/c home with [facility] PT and OT in 24-48 hours pending further clinical improvement.  Patient becomes very tachycardic with any physical exertion, takes several minutes to resolve.  Still very short of breath, requiring continued IV steroids in the hospital. Coming From - home, independent Exp DC Date 2/12 Barriers - clinical improvement, HH services Medically Stable for Discharge? No   Severity of Illness: The appropriate patient status for this patient is INPATIENT. It is not anticipated that the patient will be medically stable for discharge from the hospital within 2 midnights of admission. The following factors support the patient status of inpatient.  "           Presenting symptoms include shortness of breath, wheezing, cough. "  Worrisome physical exam findings include poor air entry and expiratory wheezing. "           Initial radiographic and laboratory data are worrisome because of leukocytosis. "           Chronic co-morbidities include  underlying pulmonary fibrosis, recent bilateral rib fractures, hypertension, hyperlipidemia, diabetes mellitus, asthma, TIA, GERD, gout, depression, OSA, anemia, chronic pain, aortic valve stenosis, CKD stage IIIa.   * I certify that at the point of admission it is my clinical judgment that the patient will require inpatient hospital care spanning beyond 2 midnights from the point of admission due to high intensity of service, high risk for further deterioration and high frequency of surveillance required.*  Consultants:   None  Procedures:   None  Antimicrobials:   None    Objective: Vitals:   11/18/19 2248 11/19/19 0334 11/19/19 0731 11/19/19 0802  BP: 103/63 111/61 (!) 161/78   Pulse: 100 99 (!) 113 (!) 108  Resp:  18 19 20   Temp:  98 F (36.7 C) 97.8 F (36.6 C)   TempSrc:  Oral Oral   SpO2: 96% 98% 100% 98%  Weight:      Height:        Intake/Output Summary (Last 24 hours) at 11/19/2019 1109 Last data filed at 11/19/2019 0552 Gross per 24 hour  Intake 393.42 ml  Output 350 ml  Net 43.42 ml   Filed Weights   11/18/19 0929 11/18/19 1632  Weight: 97.5 kg 99.5 kg    Examination:  General exam: awake, alert, no acute distress, obese HEENT: clear conjunctiva, anicteric sclera, moist mucus membranes, hearing grossly normal  Respiratory system: decreased breath sounds, faint expiratory wheezes, no rales or rhonchi, normal respiratory effort at rest, dyspnea on exertion of standing up. Cardiovascular system: normal S1/S2, RRR, no JVD, murmurs, rubs, gallops Central nervous system: A&O x3. no gross focal neurologic deficits, normal speech Extremities: moves all, no cyanosis, normal tone Skin: dry, intact, normal temperature, normal color Psychiatry: normal mood, congruent affect, judgement and insight appear normal    Data Reviewed: I have personally reviewed following labs and imaging studies  CBC: Recent Labs  Lab 11/14/19 1040 11/18/19 1005 11/19/19 0451   WBC 18.0* 13.4* 13.5*  NEUTROABS 11.3* 9.9*  --   HGB 13.2 11.3* 10.0*  HCT 42.2 36.9 32.2*  MCV 90.9 92.5 91.7  PLT 386 357 0000000   Basic Metabolic Panel: Recent Labs  Lab 11/14/19 1040 11/18/19 1005 11/19/19 0451  NA 140 141 137  K 4.4 4.6 5.1  CL 104 106 107  CO2 22 22 23   GLUCOSE 209* 174* 217*  BUN 28* 43* 47*  CREATININE 1.52* 2.20* 1.55*  CALCIUM 9.2 8.7* 8.2*   GFR: Estimated Creatinine Clearance: 38.6 mL/min (A) (by C-G formula based on SCr of 1.55 mg/dL (H)). Liver Function Tests: Recent Labs  Lab 11/14/19 1040 11/18/19 1005  AST 23 22  ALT 19 16  ALKPHOS 82 73  BILITOT 1.0 0.6  PROT 7.2 6.8  ALBUMIN 3.8 3.5   Recent Labs  Lab 11/18/19 1005  LIPASE 22   No results for input(s): AMMONIA in the last 168 hours. Coagulation Profile: No results for input(s): INR, PROTIME in the last 168 hours. Cardiac Enzymes: No results for input(s): CKTOTAL, CKMB, CKMBINDEX, TROPONINI in the last 168 hours. BNP (last 3 results) No results for input(s): PROBNP in the last 8760 hours. HbA1C: Recent Labs    11/18/19 1407  HGBA1C 8.4*   CBG:  Recent Labs  Lab 11/18/19 1652 11/18/19 2112 11/19/19 0729  GLUCAP 273* 244* 214*   Lipid Profile: No results for input(s): CHOL, HDL, LDLCALC, TRIG, CHOLHDL, LDLDIRECT in the last 72 hours. Thyroid Function Tests: No results for input(s): TSH, T4TOTAL, FREET4, T3FREE, THYROIDAB in the last 72 hours. Anemia Panel: No results for input(s): VITAMINB12, FOLATE, FERRITIN, TIBC, IRON, RETICCTPCT in the last 72 hours. Sepsis Labs: No results for input(s): PROCALCITON, LATICACIDVEN in the last 168 hours.  Recent Results (from the past 240 hour(s))  Respiratory Panel by RT PCR (Flu A&B, Covid) - Nasopharyngeal Swab     Status: None   Collection Time: 11/18/19  2:07 PM   Specimen: Nasopharyngeal Swab  Result Value Ref Range Status   SARS Coronavirus 2 by RT PCR NEGATIVE NEGATIVE Final    Comment: (NOTE) SARS-CoV-2 target  nucleic acids are NOT DETECTED. The SARS-CoV-2 RNA is generally detectable in upper respiratoy specimens during the acute phase of infection. The lowest concentration of SARS-CoV-2 viral copies this assay can detect is 131 copies/mL. A negative result does not preclude SARS-Cov-2 infection and should not be used as the sole basis for treatment or other patient management decisions. A negative result may occur with  improper specimen collection/handling, submission of specimen other than nasopharyngeal swab, presence of viral mutation(s) within the areas targeted by this assay, and inadequate number of viral copies (<131 copies/mL). A negative result must be combined with clinical observations, patient history, and epidemiological information. The expected result is Negative. Fact Sheet for Patients:  PinkCheek.be Fact Sheet for Healthcare Providers:  GravelBags.it This test is not yet ap proved or cleared by the [place] FDA and  has been authorized for detection and/or diagnosis of SARS-CoV-2 by FDA under an Emergency Use Authorization (EUA). This EUA will remain  in effect (meaning this test can be used) for the duration of the COVID-19 declaration under Section 564(b)(1) of the Act, 21 U.S.C. section 360bbb-3(b)(1), unless the authorization is terminated or revoked sooner.    Influenza A by PCR NEGATIVE NEGATIVE Final   Influenza B by PCR NEGATIVE NEGATIVE Final    Comment: (NOTE) The Xpert Xpress SARS-CoV-2/FLU/RSV assay is intended as an aid in  the diagnosis of influenza from Nasopharyngeal swab specimens and  should not be used as a sole basis for treatment. Nasal washings and  aspirates are unacceptable for Xpert Xpress SARS-CoV-2/FLU/RSV  testing. Fact Sheet for Patients: PinkCheek.be Fact Sheet for Healthcare Providers: GravelBags.it This test is not  yet approved or cleared by the [place] FDA and  has been authorized for detection and/or diagnosis of SARS-CoV-2 by  FDA under an Emergency Use Authorization (EUA). This EUA will remain  in effect (meaning this test can be used) for the duration of the  Covid-19 declaration under Section 564(b)(1) of the Act, 21  U.S.C. section 360bbb-3(b)(1), unless the authorization is  terminated or revoked. Performed at [facility], [address], [place], [place] [address]          Radiology Studies: CT ABDOMEN PELVIS WO CONTRAST  Result Date: 11/18/2019 CLINICAL DATA:  Abdominal trauma, persistent chest and epigastric pain status post ground level fall. Fall occurred on 11/11/2019, evaluated on that date, presents with persistent pain. EXAM: CT CHEST, ABDOMEN AND PELVIS WITHOUT CONTRAST TECHNIQUE: Multidetector CT imaging of the chest, abdomen and pelvis was performed following the standard protocol without IV contrast. COMPARISON:  Chest CT of 06/16/2018 FINDINGS: CT CHEST FINDINGS Cardiovascular: Calcified atherosclerotic changes throughout the thoracic  aorta. Aorta is nonaneurysmal. Limited assessment of vascular structures due to lack of intravenous contrast. The mild engorgement of central pulmonary vasculature to 2.7 cm. Signs of coronary artery calcification of left and right coronary circulation. Heart size normal without pericardial effusion. Mediastinum/Nodes: No signs of mediastinal hematoma. No signs of thoracic inlet adenopathy. No signs of axillary lymphadenopathy. No mediastinal adenopathy. Esophagus grossly normal. Lungs/Pleura: Mild centrilobular emphysema at the lung apices. Bandlike opacity in the right upper lobe along the minor fissure, associated with adjacent rib fractures, see below. No signs of pneumothorax. Airways are patent. No signs of dense consolidation or evidence of pleural effusion. Scattered atelectatic changes at the lung bases. Musculoskeletal: Right  fourth anterior rib fracture also with right fifth anterior rib fracture, sixth and seventh anterior rib and nondisplaced fracture of right eighth rib as well. Stranding along the right chest wall may represent small contusion or hematoma. This is deep to the pectoralis and associated with the intercostal musculature. Visualize scapula and clavicle on both right and left are unremarkable. Sternum is intact. Signs of previous rib fracture also demonstrated on the left but with suspected acute nondisplaced fracture of left fourth rib also with potential nondisplaced fractures of fifth, sixth, seventh and eighth ribs on the left. Spinal alignment is maintained with signs of degenerative change. Costochondral elements are intact. CT ABDOMEN PELVIS FINDINGS Hepatobiliary: Mildly limited assessment of upper abdominal contents due to respiratory motion. No signs of focal lesion or evidence of hepatic trauma to include perihepatic hematoma. Gallbladder is normal. No signs of biliary ductal dilation. Pancreas: Suggestion of cystic lesions in the pancreas (image 27, series 2) 13 mm area along the anterior pancreas with low-attenuation. Low-density in the body of the pancreas with another area that may represent a cystic lesion measuring as much as 13 mm. This is at the junction of body and tail. Pancreatic contours of changed slightly since the prior study but again there is respiratory motion in this area. No significant peripancreatic stranding. Calcification, coarse calcification in the head of the pancreas has developed since 20/11. There may be an adjacent low-attenuation area a near this calcification. The uncinate process is small but unchanged since 20/11. Spleen: Spleen is normal size without focal lesion. Adrenals/Urinary Tract: Adrenal glands are normal. Kidneys with mild scarring bilaterally. No signs of hydronephrosis or nephrolithiasis. Stomach/Bowel: Normal appendix. No signs of acute bowel process. Extensive  colonic diverticulosis. Vascular/Lymphatic: Signs of extensive atherosclerotic calcification of the abdominal aorta extending into branch vessels. No signs of adenopathy in the upper abdomen or in the retroperitoneum. No signs of pelvic lymphadenopathy. Reproductive: Post hysterectomy. No signs of adnexal mass. Other: Small infraumbilical hernia contains fat. Small umbilical hernia contains fat. Musculoskeletal: Extensive spinal degenerative changes. No acute bone process. L4 compression fracture is unchanged and associated with surrounding degenerative changes. Signs of laminectomy at L4-5 similar to prior studies. IMPRESSION: 1. Rib fractures of varying ages bilaterally with acute/subacute fractures of left and right anterolateral ribs, associated with chest wall contusion or small hematoma along the right chest wall as described. 2. No signs of pneumothorax. 3. Contusion or atelectasis in the right mid chest in the upper lobe along the fissure with basilar atelectasis bilaterally. 4. No evidence of acute traumatic injury involving the solid organs of the abdomen or pelvis. 5. Pancreatic head calcifications may be indicative of prior pancreatitis. New areas of low attenuation in the pancreas have developed since previous imaging studies. 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 Correlate with any history of pancreatitis and consider short interval follow-up after resolution of acute symptoms with pancreatic protocol CT or MRI as possible. 6. Extensive colonic diverticulosis. 7. Extensive atherosclerotic calcification of the thoracic aorta and branch vessels. 8. Mild engorgement of central pulmonary vasculature, raising the possibility of pulmonary arterial hypertension. A Aortic Atherosclerosis (ICD10-I70.0) and Emphysema (ICD10-J43.9). Electronically Signed   By: Zetta Bills M.D.   On: 11/18/2019 12:30   DG Chest 2 View  Result Date:  11/18/2019 CLINICAL DATA:  Shortness of breath EXAM: CHEST - 2 VIEW COMPARISON:  November 14, 2019 FINDINGS: Lungs are clear. Heart is upper normal in size with pulmonary vascularity normal. No adenopathy. There is aortic atherosclerosis. No bone lesions. IMPRESSION: Lungs clear. Heart upper normal in size. Aortic Atherosclerosis (ICD10-I70.0). Electronically Signed   By: Lowella Grip III M.D.   On: 11/18/2019 10:56   CT Chest Wo Contrast  Result Date: 11/18/2019 CLINICAL DATA:  Abdominal trauma, persistent chest and epigastric pain status post ground level fall. Fall occurred on 11/11/2019, evaluated on that date, presents with persistent pain. EXAM: CT CHEST, ABDOMEN AND PELVIS WITHOUT CONTRAST TECHNIQUE: Multidetector CT imaging of the chest, abdomen and pelvis was performed following the standard protocol without IV contrast. COMPARISON:  Chest CT of 06/16/2018 FINDINGS: CT CHEST FINDINGS Cardiovascular: Calcified atherosclerotic changes throughout the thoracic aorta. Aorta is nonaneurysmal. Limited assessment of vascular structures due to lack of intravenous contrast. The mild engorgement of central pulmonary vasculature to 2.7 cm. Signs of coronary artery calcification of left and right coronary circulation. Heart size normal without pericardial effusion. Mediastinum/Nodes: No signs of mediastinal hematoma. No signs of thoracic inlet adenopathy. No signs of axillary lymphadenopathy. No mediastinal adenopathy. Esophagus grossly normal. Lungs/Pleura: Mild centrilobular emphysema at the lung apices. Bandlike opacity in the right upper lobe along the minor fissure, associated with adjacent rib fractures, see below. No signs of pneumothorax. Airways are patent. No signs of dense consolidation or evidence of pleural effusion. Scattered atelectatic changes at the lung bases. Musculoskeletal: Right fourth anterior rib fracture also with right fifth anterior rib fracture, sixth and seventh anterior rib and  nondisplaced fracture of right eighth rib as well. Stranding along the right chest wall may represent small contusion or hematoma. This is deep to the pectoralis and associated with the intercostal musculature. Visualize scapula and clavicle on both right and left are unremarkable. Sternum is intact. Signs of previous rib fracture also demonstrated on the left but with suspected acute nondisplaced fracture of left fourth rib also with potential nondisplaced fractures of fifth, sixth, seventh and eighth ribs on the left. Spinal alignment is maintained with signs of degenerative change. Costochondral elements are intact. CT ABDOMEN PELVIS FINDINGS Hepatobiliary: Mildly limited assessment of upper abdominal contents due to respiratory motion. No signs of focal lesion or evidence of hepatic trauma to include perihepatic hematoma. Gallbladder is normal. No signs of biliary ductal dilation. Pancreas: Suggestion of cystic lesions in the pancreas (image 27, series 2) 13 mm area along the anterior pancreas with low-attenuation. Low-density in the body of the pancreas with another area that may represent a cystic lesion measuring as much as 13 mm. This is at the junction of body and tail. Pancreatic contours of changed slightly since the prior study but again there is respiratory motion in this area. No significant peripancreatic stranding. Calcification, coarse calcification in the head of the pancreas has developed since 20/11. There may be an adjacent low-attenuation area a  near this calcification. The uncinate process is small but unchanged since 20/11. Spleen: Spleen is normal size without focal lesion. Adrenals/Urinary Tract: Adrenal glands are normal. Kidneys with mild scarring bilaterally. No signs of hydronephrosis or nephrolithiasis. Stomach/Bowel: Normal appendix. No signs of acute bowel process. Extensive colonic diverticulosis. Vascular/Lymphatic: Signs of extensive atherosclerotic calcification of the abdominal  aorta extending into branch vessels. No signs of adenopathy in the upper abdomen or in the retroperitoneum. No signs of pelvic lymphadenopathy. Reproductive: Post hysterectomy. No signs of adnexal mass. Other: Small infraumbilical hernia contains fat. Small umbilical hernia contains fat. Musculoskeletal: Extensive spinal degenerative changes. No acute bone process. L4 compression fracture is unchanged and associated with surrounding degenerative changes. Signs of laminectomy at L4-5 similar to prior studies. IMPRESSION: 1. Rib fractures of varying ages bilaterally with acute/subacute fractures of left and right anterolateral ribs, associated with chest wall contusion or small hematoma along the right chest wall as described. 2. No signs of pneumothorax. 3. Contusion or atelectasis in the right mid chest in the upper lobe along the fissure with basilar atelectasis bilaterally. 4. No evidence of acute traumatic injury involving the solid organs of the abdomen or pelvis. 5. Pancreatic head calcifications may be indicative of prior pancreatitis. New areas of low attenuation in the pancreas have developed since previous imaging studies. 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 Correlate with any history of pancreatitis and consider short interval follow-up after resolution of acute symptoms with pancreatic protocol CT or MRI as possible. 6. Extensive colonic diverticulosis. 7. Extensive atherosclerotic calcification of the thoracic aorta and branch vessels. 8. Mild engorgement of central pulmonary vasculature, raising the possibility of pulmonary arterial hypertension. A Aortic Atherosclerosis (ICD10-I70.0) and Emphysema (ICD10-J43.9). Electronically Signed   By: Zetta Bills M.D.   On: 11/18/2019 12:30        Scheduled Meds: . allopurinol  100 mg Oral Daily  . dextromethorphan-guaiFENesin  1 tablet Oral BID  . DULoxetine  30 mg Oral Daily   . insulin aspart  0-5 Units Subcutaneous QHS  . insulin aspart  0-9 Units Subcutaneous TID WC  . ipratropium-albuterol  3 mL Nebulization TID  . methylPREDNISolone (SOLU-MEDROL) injection  60 mg Intravenous Q12H  . mometasone-formoterol  2 puff Inhalation BID  . oxyCODONE-acetaminophen  2 tablet Oral Once  . pantoprazole  40 mg Oral Daily  . simvastatin  20 mg Oral Daily  . sodium chloride flush  10 mL Intravenous Q12H   Continuous Infusions: . sodium chloride 75 mL/hr at 11/19/19 0601     LOS: 0 days    Time spent: 35 minutes    Ezekiel Slocumb, DO [facility]   If 7PM-7AM, please contact night-coverage [email or web address] 11/19/2019, 11:09 AM

## 2019-11-19 NOTE — Plan of Care (Signed)
  Problem: Education: Goal: Knowledge of General Education information will improve Description: Including pain rating scale, medication(s)/side effects and non-pharmacologic comfort measures Outcome: Progressing   Problem: Health Behavior/Discharge Planning: Goal: Ability to manage health-related needs will improve Outcome: Progressing   Problem: Clinical Measurements: Goal: Respiratory complications will improve Outcome: Progressing Goal: Cardiovascular complication will be avoided Outcome: Progressing   Problem: Activity: Goal: Risk for activity intolerance will decrease Outcome: Not Progressing Note: Needs pain control.   Problem: Pain Managment: Goal: General experience of comfort will improve Outcome: Not Progressing

## 2019-11-20 LAB — BASIC METABOLIC PANEL
Anion gap: 9 (ref 5–15)
BUN: 47 mg/dL — ABNORMAL HIGH (ref 8–23)
CO2: 20 mmol/L — ABNORMAL LOW (ref 22–32)
Calcium: 8.3 mg/dL — ABNORMAL LOW (ref 8.9–10.3)
Chloride: 107 mmol/L (ref 98–111)
Creatinine, Ser: 1.24 mg/dL — ABNORMAL HIGH (ref 0.44–1.00)
GFR calc Af Amer: 52 mL/min — ABNORMAL LOW (ref >60)
GFR calc non Af Amer: 45 mL/min — ABNORMAL LOW (ref >60)
Glucose, Bld: 217 mg/dL — ABNORMAL HIGH (ref 70–99)
Potassium: 4.5 mmol/L (ref 3.5–5.1)
Sodium: 136 mmol/L (ref 135–145)

## 2019-11-20 LAB — CBC WITH DIFFERENTIAL/PLATELET
Abs Immature Granulocytes: 0.17 10*3/uL — ABNORMAL HIGH (ref 0.00–0.07)
Basophils Absolute: 0 10*3/uL (ref 0.0–0.1)
Basophils Relative: 0 %
Eosinophils Absolute: 0 10*3/uL (ref 0.0–0.5)
Eosinophils Relative: 0 %
HCT: 32.6 % — ABNORMAL LOW (ref 36.0–46.0)
Hemoglobin: 10.3 g/dL — ABNORMAL LOW (ref 12.0–15.0)
Immature Granulocytes: 1 %
Lymphocytes Relative: 5 %
Lymphs Abs: 0.9 10*3/uL (ref 0.7–4.0)
MCH: 28.2 pg (ref 26.0–34.0)
MCHC: 31.6 g/dL (ref 30.0–36.0)
MCV: 89.3 fL (ref 80.0–100.0)
Monocytes Absolute: 0.8 10*3/uL (ref 0.1–1.0)
Monocytes Relative: 5 %
Neutro Abs: 15.2 10*3/uL — ABNORMAL HIGH (ref 1.7–7.7)
Neutrophils Relative %: 89 %
Platelets: 318 10*3/uL (ref 150–400)
RBC: 3.65 MIL/uL — ABNORMAL LOW (ref 3.87–5.11)
RDW: 15.7 % — ABNORMAL HIGH (ref 11.5–15.5)
WBC: 17.1 10*3/uL — ABNORMAL HIGH (ref 4.0–10.5)
nRBC: 0 % (ref 0.0–0.2)

## 2019-11-20 LAB — GLUCOSE, CAPILLARY
Glucose-Capillary: 164 mg/dL — ABNORMAL HIGH (ref 70–99)
Glucose-Capillary: 237 mg/dL — ABNORMAL HIGH (ref 70–99)
Glucose-Capillary: 228 mg/dL — ABNORMAL HIGH (ref 70–99)
Glucose-Capillary: 354 mg/dL — ABNORMAL HIGH (ref 70–99)

## 2019-11-20 LAB — MAGNESIUM: Magnesium: 1.9 mg/dL (ref 1.7–2.4)

## 2019-11-20 MED ORDER — INSULIN DETEMIR 100 UNIT/ML ~~LOC~~ SOLN
8.0000 [IU] | Freq: Two times a day (BID) | SUBCUTANEOUS | Status: DC
  Administered 2019-11-20 – 2019-11-24 (×8): 8 [IU] via SUBCUTANEOUS
  Filled 2019-11-20 (×10): qty 0.08

## 2019-11-20 MED ORDER — MAGNESIUM CITRATE PO SOLN
1.0000 | Freq: Once | ORAL | Status: AC
  Administered 2019-11-20: 1 via ORAL
  Filled 2019-11-20: qty 296

## 2019-11-20 MED ORDER — LOSARTAN POTASSIUM 50 MG PO TABS
50.0000 mg | ORAL_TABLET | Freq: Every day | ORAL | Status: DC
  Administered 2019-11-20 – 2019-11-21 (×2): 50 mg via ORAL
  Filled 2019-11-20 (×2): qty 1

## 2019-11-20 MED ORDER — POLYETHYLENE GLYCOL 3350 17 G PO PACK
17.0000 g | PACK | Freq: Every day | ORAL | Status: DC
  Administered 2019-11-20 – 2019-11-24 (×4): 17 g via ORAL
  Filled 2019-11-20 (×4): qty 1

## 2019-11-20 MED ORDER — INSULIN ASPART 100 UNIT/ML ~~LOC~~ SOLN
3.0000 [IU] | Freq: Three times a day (TID) | SUBCUTANEOUS | Status: DC
  Administered 2019-11-20 – 2019-11-22 (×6): 3 [IU] via SUBCUTANEOUS
  Filled 2019-11-20 (×6): qty 1

## 2019-11-20 MED ORDER — IPRATROPIUM-ALBUTEROL 0.5-2.5 (3) MG/3ML IN SOLN
3.0000 mL | Freq: Three times a day (TID) | RESPIRATORY_TRACT | Status: DC
  Administered 2019-11-20 – 2019-11-24 (×12): 3 mL via RESPIRATORY_TRACT
  Filled 2019-11-20 (×13): qty 3

## 2019-11-20 MED ORDER — SENNOSIDES-DOCUSATE SODIUM 8.6-50 MG PO TABS
1.0000 | ORAL_TABLET | Freq: Once | ORAL | Status: AC
  Administered 2019-11-20: 1 via ORAL
  Filled 2019-11-20: qty 1

## 2019-11-20 MED ORDER — BISACODYL 10 MG RE SUPP: 10.0000 mg | Freq: Every day | RECTAL | Status: DC | PRN

## 2019-11-20 NOTE — Progress Notes (Addendum)
PROGRESS NOTE    [name] [name]  [id number] DOB: Oct 16, 1950 DOA: 11/18/2019  PCP: Tracie Harrier, MD    LOS - 1   Brief Narrative:  [name] Laut Smithis a 69 y.o.femalewith medical history significant ofhypertension, hyperlipidemia, diabetes mellitus, asthma, TIA, GERD, gout, depression, OSA, pulmonary fibrosis, anemia, chronic pain, aortic valve stenosis, CKD stage IIIa, who presented to the ED on 2/10 with worsening shortness breath, dry cough and wheezing.  Of note, patient has bilateral lower rib fractures after a mechanical fall at home on 2/3 and continues with significant pain on inspirations.  In the ED, vitals stable.  Labs showed leukocytosis.  Covid-19 test negative.  CXR clear of infiltrates.  Patient admitted to hospitalist service with acute asthma exacerbation.  Subjective 2/12: Patient seen this AM, just after being up to the bathroom.  She was visibly dyspneic, and continued to have conversational dyspnea during encounter after resting for several minutes.  Reports she used the incentive spirometer once.  Counseled patient on its purpose and to use frequently, several times every hour.  Continues to have significant lower rib cage pain anteriorly.  She denies fever/chills, N/V/D or other complaints.  No acute events reported.  Assessment & Plan:   Principal Problem:   Asthma exacerbation Active Problems:   Benign essential hypertension   Gastroesophageal reflux disease without esophagitis   Fall   Closed rib fracture   Pulmonary fibrosis (HCC)   Acute renal failure superimposed on stage 3a chronic kidney disease (HCC)   Acute Asthma Exacerbation in setting of underlying Pulmonary Fibrosis --continue Solu-medrol 60 mg IV BID  --scheduled Duonebs q6h --PRN albuterol nebs q3h --Mucinex BID --cont Dulera (sub'd for home Symbicort) --possibly can transition to oral steroid tomorrow if further improved --supplemental oxygen if needed to keep sat >  90%  Leukocytosis - increasing likely due to steroids.  No fever/chills or other signs of infection at this time. --monitor CBC and clinical status  Type 2 Diabetes with hyperglycemia - due to steroids.  A1C 8.4% on admission. --increase Levemir to 8 units BID --add Novolog 3 units TID WC --sliding scale Novolog  Hypertension - chronic, stable --resume losartan  --Hydralazine prn  Gastroesophageal reflux disease without esophagitis -continue home PPI  Mechanical Fall -PT/OT  Closed rib fractures (due to fall 11/11/19) --prnoxycodone, fentanyl, Robaxin --Incentive spirometry  Acute renal failure superimposed on stage 3a chronic kidney disease ([place]): Baseline Cre is1.02-1.5. Creatinine 2.20 on admission, likely due to dehydration and continuation of ARB -improved with IV hydration - stop fluids --follow BMP daily --Avoid renal toxic agents and hypotension --resume losartan  Incidental findings of CT scan:CT scan showed pancreatic head calcifications may be indicative of prior pancreatitis. New areas of low attenuation in the pancreas have developed since previous imaging studies. 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 -this must be f/u with PCP -will check lipase    DVT prophylaxis: heparin   Code Status: Full Code  Family Communication: none at bedside   Disposition Plan:  Expect d/c home with [facility] PT and OT in 24-48 hours pending further clinical improvement.  Patient continues to be quite dyspneic with minimal exertion and becomes very tachycardic.  Requires continued IV steroids in the hospital at this time. Coming From - home, independent Exp DC Date 2/12 Barriers - clinical improvement, HH services Medically Stable for Discharge? No   Objective: Vitals:   11/20/19 0345 11/20/19 [id number] 11/20/19 0816 11/20/19 1138  BP: 118/70 139/70  138/82  Pulse: (!) 107 (!) 109 (!) 108 (!) 108   Resp: 20 16 18 16   Temp: 98.2 F (36.8 C) 98.4 F (36.9 C)  97.8 F (36.6 C)  TempSrc: Oral Oral  Oral  SpO2: 94% 100% 98% 99%  Weight:      Height:        Intake/Output Summary (Last 24 hours) at 11/20/2019 1220 Last data filed at 11/20/2019 1009 Gross per 24 hour  Intake 840 ml  Output 700 ml  Net 140 ml   Filed Weights   11/18/19 0929 11/18/19 1632  Weight: 97.5 kg 99.5 kg    Examination:  General exam: awake, alert, no acute distress, obese HEENT: clear conjunctiva, anicteric sclera, moist mucus membranes, hearing grossly normal  Respiratory system: poor air entry with faint expiratory wheezes, no rales or rhonchi, increased respiratory effort with conversational dyspnea. Cardiovascular system: normal S1/S2, RRR, no JVD, murmurs, rubs, gallops Central nervous system: A&O x4. no gross focal neurologic deficits, normal speech Skin: dry, intact, normal temperature, normal color Psychiatry: normal mood, congruent affect, judgement and insight appear normal    Data Reviewed: I have personally reviewed following labs and imaging studies  CBC: Recent Labs  Lab 11/14/19 1040 11/18/19 1005 11/19/19 0451 11/20/19 0652  WBC 18.0* 13.4* 13.5* 17.1*  NEUTROABS 11.3* 9.9*  --  15.2*  HGB 13.2 11.3* 10.0* 10.3*  HCT 42.2 36.9 32.2* 32.6*  MCV 90.9 92.5 91.7 89.3  PLT 386 357 318 0000000   Basic Metabolic Panel: Recent Labs  Lab 11/14/19 1040 11/18/19 1005 11/19/19 0451 11/20/19 0652  NA 140 141 137 136  K 4.4 4.6 5.1 4.5  CL 104 106 107 107  CO2 22 22 23  20*  GLUCOSE 209* 174* 217* 217*  BUN 28* 43* 47* 47*  CREATININE 1.52* 2.20* 1.55* 1.24*  CALCIUM 9.2 8.7* 8.2* 8.3*  MG  --   --   --  1.9   GFR: Estimated Creatinine Clearance: 48.3 mL/min (A) (by C-G formula based on SCr of 1.24 mg/dL (H)). Liver Function Tests: Recent Labs  Lab 11/14/19 1040 11/18/19 1005  AST 23 22  ALT 19 16  ALKPHOS 82 73  BILITOT 1.0 0.6  PROT 7.2 6.8  ALBUMIN 3.8 3.5    Recent Labs  Lab 11/18/19 1005  LIPASE 22   No results for input(s): AMMONIA in the last 168 hours. Coagulation Profile: No results for input(s): INR, PROTIME in the last 168 hours. Cardiac Enzymes: No results for input(s): CKTOTAL, CKMB, CKMBINDEX, TROPONINI in the last 168 hours. BNP (last 3 results) No results for input(s): PROBNP in the last 8760 hours. HbA1C: Recent Labs    11/18/19 1407  HGBA1C 8.4*   CBG: Recent Labs  Lab 11/19/19 1132 11/19/19 1704 11/19/19 2101 11/20/19 0810 11/20/19 1140  GLUCAP 226* 254* 264* 164* 237*   Lipid Profile: No results for input(s): CHOL, HDL, LDLCALC, TRIG, CHOLHDL, LDLDIRECT in the last 72 hours. Thyroid Function Tests: No results for input(s): TSH, T4TOTAL, FREET4, T3FREE, THYROIDAB in the last 72 hours. Anemia Panel: No results for input(s): VITAMINB12, FOLATE, FERRITIN, TIBC, IRON, RETICCTPCT in the last 72 hours. Sepsis Labs: No results for input(s): PROCALCITON, LATICACIDVEN in the last 168 hours.  Recent Results (from the past 240 hour(s))  Respiratory Panel by RT PCR (Flu A&B, Covid) - Nasopharyngeal Swab     Status: None   Collection Time: 11/18/19  2:07 PM   Specimen: Nasopharyngeal Swab  Result Value Ref  Range Status   SARS Coronavirus 2 by RT PCR NEGATIVE NEGATIVE Final    Comment: (NOTE) SARS-CoV-2 target nucleic acids are NOT DETECTED. The SARS-CoV-2 RNA is generally detectable in upper respiratoy specimens during the acute phase of infection. The lowest concentration of SARS-CoV-2 viral copies this assay can detect is 131 copies/mL. A negative result does not preclude SARS-Cov-2 infection and should not be used as the sole basis for treatment or other patient management decisions. A negative result may occur with  improper specimen collection/handling, submission of specimen other than nasopharyngeal swab, presence of viral mutation(s) within the areas targeted by this assay, and inadequate number of viral  copies (<131 copies/mL). A negative result must be combined with clinical observations, patient history, and epidemiological information. The expected result is Negative. Fact Sheet for Patients:  PinkCheek.be Fact Sheet for Healthcare Providers:  GravelBags.it This test is not yet ap proved or cleared by the [place] FDA and  has been authorized for detection and/or diagnosis of SARS-CoV-2 by FDA under an Emergency Use Authorization (EUA). This EUA will remain  in effect (meaning this test can be used) for the duration of the COVID-19 declaration under Section 564(b)(1) of the Act, 21 U.S.C. section 360bbb-3(b)(1), unless the authorization is terminated or revoked sooner.    Influenza A by PCR NEGATIVE NEGATIVE Final   Influenza B by PCR NEGATIVE NEGATIVE Final    Comment: (NOTE) The Xpert Xpress SARS-CoV-2/FLU/RSV assay is intended as an aid in  the diagnosis of influenza from Nasopharyngeal swab specimens and  should not be used as a sole basis for treatment. Nasal washings and  aspirates are unacceptable for Xpert Xpress SARS-CoV-2/FLU/RSV  testing. Fact Sheet for Patients: PinkCheek.be Fact Sheet for Healthcare Providers: GravelBags.it This test is not yet approved or cleared by the [place] FDA and  has been authorized for detection and/or diagnosis of SARS-CoV-2 by  FDA under an Emergency Use Authorization (EUA). This EUA will remain  in effect (meaning this test can be used) for the duration of the  Covid-19 declaration under Section 564(b)(1) of the Act, 21  U.S.C. section 360bbb-3(b)(1), unless the authorization is  terminated or revoked. Performed at [facility], [address], [place], [place] [address]          Radiology Studies: No results found.      Scheduled Meds: . allopurinol  100 mg Oral Daily  .  dextromethorphan-guaiFENesin  1 tablet Oral BID  . DULoxetine  30 mg Oral Daily  . heparin injection (subcutaneous)  5,000 Units Subcutaneous Q8H  . insulin aspart  0-5 Units Subcutaneous QHS  . insulin aspart  0-9 Units Subcutaneous TID WC  . insulin detemir  5 Units Subcutaneous BID  . ipratropium-albuterol  3 mL Nebulization TID  . methylPREDNISolone (SOLU-MEDROL) injection  60 mg Intravenous Q12H  . mometasone-formoterol  2 puff Inhalation BID  . oxyCODONE-acetaminophen  2 tablet Oral Once  . pantoprazole  40 mg Oral Daily  . polyethylene glycol  17 g Oral Daily  . simvastatin  20 mg Oral Daily  . sodium chloride flush  10 mL Intravenous Q12H   Continuous Infusions: . sodium chloride 75 mL/hr at 11/19/19 2230     LOS: 1 day    Time spent: [place], DO [facility]   If 7PM-7AM, please contact night-coverage [email or web address] 11/20/2019, 12:20 PM

## 2019-11-20 NOTE — Progress Notes (Signed)
Inpatient Diabetes Program Recommendations  AACE/ADA: New Consensus Statement on Inpatient Glycemic Control   Target Ranges:  Prepandial:   less than 140 mg/dL      Peak postprandial:   less than 180 mg/dL (1-2 hours)      Critically ill patients:  140 - 180 mg/dL   Results for [name] [name], [name] [name] (MRN [id number]) as of 11/20/2019 08:16  Ref. Range 11/19/2019 07:29 11/19/2019 11:32 11/19/2019 17:04 11/19/2019 21:01 11/20/2019 08:10  Glucose-Capillary Latest Ref Range: 70 - 99 mg/dL 214 (H) 226 (H) 254 (H) 264 (H) 164 (H)   Review of Glycemic Control  Diabetes history: DM2 Outpatient Diabetes medications: Amaryl 2 QAM, Metformin 500 mg BID Current orders for Inpatient glycemic control: Levemir 5 units BID, Novolog 0-9 units TID with meals, Novolog 0-5 units QHS; Solumedrol 60 mg Q12H  Inpatient Diabetes Program Recommendations:   Insulin-Basal: If steroids are continued, please consider increasing Levemir to 8 units BID. Once steroids are tapered, will likely need to decrease or discontinue basal insulin.  Insulin-Meal Coverage: If steroids are continued, please consider ordering Novolog 3 units TID with meals for meal coverage if patient eats at least 50% of meals.  Thanks, Barnie Alderman, RN, MSN, CDE Diabetes Coordinator Inpatient Diabetes Program [phone] (Team Pager from 8am to 5pm)

## 2019-11-20 NOTE — Progress Notes (Signed)
Physical Therapy Treatment Patient Details Name: [name] [name] MRN: [id number] DOB: [birth year] Today's Date: 11/20/2019    History of Present Illness 69 y.o. female with medical history significant of hypertension, hyperlipidemia, diabetes mellitus, asthma, TIA, GERD, gout, depression, OSA, pulmonary fibrosis, anemia, chronic pain, aortic valve stenosis, lumbar stenosis, CKD stage IIIa, who presents with worsening shortness breath. Pt also experienced a fall 11/11/2019, discharged home, returned 11/14/2019 due to pain. This admission imaging showed "Rib fractures of varying ages bilaterally with acute/subacutefractures of left and right anterolateral ribs, associated withchest wall contusion or small hematoma along the right chest wall as described."    PT Comments    Pt is making good progress towards goals with increased ambulation attempts this date. RW used for all mobility. With exertion, HR increased to 140bpm and then again to 143bpm with 2nd bout of ambulation. RN notified. Will continue to progress as able.   Follow Up Recommendations  Home health PT;Supervision - Intermittent     Equipment Recommendations  None recommended by PT(has equipment at home)    Recommendations for Other Services       Precautions / Restrictions Precautions Precautions: Fall Restrictions Weight Bearing Restrictions: No    Mobility  Bed Mobility               General bed mobility comments: not performed as pt received in recliner  Transfers Overall transfer level: Needs assistance Equipment used: Rolling walker (2 wheeled) Transfers: Sit to/from Stand Sit to Stand: Supervision         General transfer comment: safe technique with upright posture noted. RW used for assistance  Ambulation/Gait Ambulation/Gait assistance: Supervision Gait Distance (Feet): 80 Feet Assistive device: Rolling walker (2 wheeled) Gait Pattern/deviations: Step-through pattern     General Gait Details: 2  bouts of ambulation performed in room with seated rest break in middle secondary to elevated HR. Reciprocal gait pattern noted and pt demonstrates safe technique   Stairs             Wheelchair Mobility    Modified Rankin (Stroke Patients Only)       Balance Overall balance assessment: Needs assistance Sitting-balance support: No upper extremity supported;Feet supported Sitting balance-Leahy Scale: Good     Standing balance support: Bilateral upper extremity supported;Single extremity supported;During functional activity Standing balance-Leahy Scale: Good                              Cognition Arousal/Alertness: Awake/alert Behavior During Therapy: WFL for tasks assessed/performed Overall Cognitive Status: Within Functional Limits for tasks assessed                                        Exercises      General Comments        Pertinent Vitals/Pain Pain Assessment: No/denies pain    Home Living                      Prior Function            PT Goals (current goals can now be found in the care plan section) Acute Rehab PT Goals Patient Stated Goal: to go home PT Goal Formulation: With patient Time For Goal Achievement: 12/03/19 Potential to Achieve Goals: Good Progress towards PT goals: Progressing toward goals    Frequency    Min 2X/week  PT Plan Current plan remains appropriate    Co-evaluation              AM-PAC PT "6 Clicks" Mobility   Outcome Measure  Help needed turning from your back to your side while in a flat bed without using bedrails?: None Help needed moving from lying on your back to sitting on the side of a flat bed without using bedrails?: None Help needed moving to and from a bed to a chair (including a wheelchair)?: A Little Help needed standing up from a chair using your arms (e.g., wheelchair or bedside chair)?: A Little Help needed to walk in hospital room?: A Little Help  needed climbing 3-5 steps with a railing? : A Lot 6 Click Score: 19    End of Session Equipment Utilized During Treatment: Gait belt Activity Tolerance: Patient tolerated treatment well Patient left: in chair;with chair alarm set;with call bell/phone within reach Nurse Communication: Mobility status PT Visit Diagnosis: Other abnormalities of gait and mobility (R26.89);Muscle weakness (generalized) (M62.81);Difficulty in walking, not elsewhere classified (R26.2);Pain Pain - Right/Left: Right     Time: [id number] PT Time Calculation (min) (ACUTE ONLY): 16 min  Charges:  $Gait Training: 8-22 mins                     Greggory Stallion, PT, DPT [phone]    Adalyn Pennock 11/20/2019, 11:54 AM

## 2019-11-20 NOTE — Progress Notes (Signed)
Physical Therapy reporting that pts HR sustaining in 140s while walking/ HR improved with rest/ MD made aware/ will monitor.

## 2019-11-21 LAB — GLUCOSE, CAPILLARY
Glucose-Capillary: 227 mg/dL — ABNORMAL HIGH (ref 70–99)
Glucose-Capillary: 311 mg/dL — ABNORMAL HIGH (ref 70–99)
Glucose-Capillary: 246 mg/dL — ABNORMAL HIGH (ref 70–99)
Glucose-Capillary: 252 mg/dL — ABNORMAL HIGH (ref 70–99)
Glucose-Capillary: 277 mg/dL — ABNORMAL HIGH (ref 70–99)

## 2019-11-21 LAB — CBC WITH DIFFERENTIAL/PLATELET
Abs Immature Granulocytes: 0.23 10*3/uL — ABNORMAL HIGH (ref 0.00–0.07)
Basophils Absolute: 0 10*3/uL (ref 0.0–0.1)
Basophils Relative: 0 %
Eosinophils Absolute: 0 10*3/uL (ref 0.0–0.5)
Eosinophils Relative: 0 %
HCT: 31.7 % — ABNORMAL LOW (ref 36.0–46.0)
Hemoglobin: 10.2 g/dL — ABNORMAL LOW (ref 12.0–15.0)
Immature Granulocytes: 1 %
Lymphocytes Relative: 5 %
Lymphs Abs: 0.8 10*3/uL (ref 0.7–4.0)
MCH: 28.7 pg (ref 26.0–34.0)
MCHC: 32.2 g/dL (ref 30.0–36.0)
MCV: 89.3 fL (ref 80.0–100.0)
Monocytes Absolute: 0.2 10*3/uL (ref 0.1–1.0)
Monocytes Relative: 1 %
Neutro Abs: 15.2 10*3/uL — ABNORMAL HIGH (ref 1.7–7.7)
Neutrophils Relative %: 93 %
Platelets: 332 10*3/uL (ref 150–400)
RBC: 3.55 MIL/uL — ABNORMAL LOW (ref 3.87–5.11)
RDW: 15.8 % — ABNORMAL HIGH (ref 11.5–15.5)
WBC: 16.4 10*3/uL — ABNORMAL HIGH (ref 4.0–10.5)
nRBC: 0 % (ref 0.0–0.2)

## 2019-11-21 LAB — BASIC METABOLIC PANEL WITH GFR
Anion gap: 9 (ref 5–15)
BUN: 54 mg/dL — ABNORMAL HIGH (ref 8–23)
CO2: 22 mmol/L (ref 22–32)
Calcium: 8.4 mg/dL — ABNORMAL LOW (ref 8.9–10.3)
Chloride: 104 mmol/L (ref 98–111)
Creatinine, Ser: 1.42 mg/dL — ABNORMAL HIGH (ref 0.44–1.00)
GFR calc Af Amer: 44 mL/min — ABNORMAL LOW
GFR calc non Af Amer: 38 mL/min — ABNORMAL LOW
Glucose, Bld: 289 mg/dL — ABNORMAL HIGH (ref 70–99)
Potassium: 5.1 mmol/L (ref 3.5–5.1)
Sodium: 135 mmol/L (ref 135–145)

## 2019-11-21 LAB — MAGNESIUM: Magnesium: 2.5 mg/dL — ABNORMAL HIGH (ref 1.7–2.4)

## 2019-11-21 MED ORDER — DICLOFENAC SODIUM 1 % EX GEL
2.0000 g | Freq: Four times a day (QID) | CUTANEOUS | Status: DC
  Administered 2019-11-21 – 2019-11-24 (×12): 2 g via TOPICAL
  Filled 2019-11-21: qty 100

## 2019-11-21 MED ORDER — ACETAMINOPHEN 500 MG PO TABS: 1000.0000 mg | ORAL_TABLET | Freq: Three times a day (TID) | ORAL | Status: DC

## 2019-11-21 MED ORDER — PREDNISONE 50 MG PO TABS
60.0000 mg | ORAL_TABLET | Freq: Every day | ORAL | Status: DC
  Administered 2019-11-22 – 2019-11-23 (×2): 60 mg via ORAL
  Filled 2019-11-21 (×2): qty 1

## 2019-11-21 NOTE — Progress Notes (Addendum)
CCMD called-  Patient HR jumped up to 150 bpm.  Per patient- she went to bathroom.   She did have some SOB.  Denies CP or dizziness.   Back down to 125 bpm at rest at this time  MD is aware

## 2019-11-21 NOTE — Progress Notes (Signed)
PROGRESS NOTE    [name] [name]  [id number] DOB: [birth year] DOA: 11/18/2019  PCP: Tracie Harrier, MD    LOS - 2   Brief Narrative:  [name] [name] a 69 y.o.femalewith medical history significant ofhypertension, hyperlipidemia, diabetes mellitus, asthma, TIA, GERD, gout, depression, OSA, pulmonary fibrosis, anemia, chronic pain, aortic valve stenosis, CKD stage IIIa, who presented to the ED on 2/10with worseningshortness breath, dry cough and wheezing.Of note, patient has bilateral lower rib fractures after a mechanical fall at home on 2/3 and continues with significant pain on inspirations. In the ED, vitals stable. Labs showed leukocytosis. Covid-19 test negative. CXR clear of infiltrates. Patient admitted to hospitalist service with acute asthma exacerbation.  Subjective 2/13: Patient seen this AM.  No acute events reported overnight.  This morning while up to bathroom, patient's heart rate spiked to 150 on telemetry.  Only recovered to the 120s after resting.  Patient reports ongoing rib cage pain especially when sitting forward.  She thinks this is getting a little better.  Continues to report being more short of breath than her baseline.  Denies fevers or chills, but does report feeling like she has phlegm she is unable to cough up.  Assessment & Plan:   Principal Problem:   Asthma exacerbation Active Problems:   Benign essential hypertension   Gastroesophageal reflux disease without esophagitis   Fall   Closed rib fracture   Pulmonary fibrosis (HCC)   Acute renal failure superimposed on stage 3a chronic kidney disease (HCC)   Acute Asthma Exacerbation in setting of underlying Pulmonary Fibrosis It appears the patient's rib cage pain from fractures after her fall are limiting her inspirations and at least partially contributing to her asthma exacerbation.  She continues to get very dyspneic and tachycardic with any exertion, and slow to recover at rest.    --continue Solu-medrol 60 mg IV BID, plan to transition to oral prednisone tomorrow --scheduled Duonebs q6h --PRN albuterol nebs q3h --Mucinex BID --cont Dulera (sub'd for home Symbicort) --We will hold off another day to transition to oral steroid --supplemental oxygen if needed to keep sat >90% --Add flutter valve  Sinus tachycardia -secondary to above and pain with rib fractures.   --Monitor on telemetry --No need to treat for rate control unless A. Fib/flutter, or sustaining over 120 at rest  Leukocytosis - increasing likely due to steroids.  No fever/chills or other signs of infection at this time. --monitor CBC and clinical status  Type 2 Diabetes with hyperglycemia - due to steroids.  A1C 8.4% on admission. --increase Levemir to 8 units BID --add Novolog 3 units TID WC --sliding scale Novolog  Hypertension- chronic, stable --resumelosartan  --Hydralazine prn  Gastroesophageal reflux disease without esophagitis -continue homePPI  MechanicalFall -PT/OT  Closed rib fractures(due to fall 11/11/19) --prnoxycodone, fentanyl, Robaxin --Incentive spirometry  Acute renal failure superimposed on stage 3a chronic kidney disease ([place]): Baseline Cre is1.02-1.5.Creatinine 2.20on admission,likely due to dehydration and continuation of ARB -improved with IV hydration - stop fluids --follow BMP daily --Avoid renal toxicagents and hypotension --resume losartan  Incidental findings of CT scan:CT scan showed pancreatic head calcifications may be indicative of prior pancreatitis. New areas of low attenuation in the pancreas have developed since previous imaging studies. 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 -this must be f/u with PCP -will check lipase    DVT prophylaxis:heparin Code Status: Full Code Family Communication:none at bedside  Disposition Plan:Expect d/c home  with  HH PT and OT in 24-48 more hours pending further clinical improvement. Again, she  continues to be very dyspneic and tachycardic with minimal exertion, and is quite slow to recover. Requires continued IV steroids for asthma exacerbation in the hospital at this time. Coming From- home, independent Exp DC Date2/14 Barriers- clinical improvement, HH services Medically Stable for Discharge?No   Objective: Vitals:   11/20/19 2059 11/21/19 0446 11/21/19 0753 11/21/19 1425  BP:  107/71 (!) 91/57 115/60  Pulse:  (!) 106 (!) 102 (!) 117  Resp:  19    Temp:  (!) 97.5 F (36.4 C) 97.8 F (36.6 C)   TempSrc:  Oral Oral   SpO2: 96% 96% 100% 98%  Weight:      Height:        Intake/Output Summary (Last 24 hours) at 11/21/2019 1455 Last data filed at 11/21/2019 1259 Gross per 24 hour  Intake 653 ml  Output 300 ml  Net 353 ml   Filed Weights   11/18/19 0929 11/18/19 1632  Weight: 97.5 kg 99.5 kg    Examination:  General exam: awake, alert, no acute distress, obese HEENT: moist mucus membranes, hearing grossly normal  Respiratory system: Continues to have shallow inspirations, expiratory wheezes, no rhonchi, increased respiratory effort and conversational dyspnea. Cardiovascular system: normal [id number], RRR, 3/6 systolic murmur, no pedal edema.   Central nervous system: A&O x3. no gross focal neurologic deficits, normal speech Extremities: moves all, no edema, normal tone Psychiatry: normal mood, congruent affect, judgement and insight appear normal    Data Reviewed: I have personally reviewed following labs and imaging studies  CBC: Recent Labs  Lab 11/18/19 1005 11/19/19 0451 11/20/19 0652 11/21/19 0459  WBC 13.4* 13.5* 17.1* 16.4*  NEUTROABS 9.9*  --  15.2* 15.2*  HGB 11.3* 10.0* 10.3* 10.2*  HCT 36.9 32.2* 32.6* 31.7*  MCV 92.5 91.7 89.3 89.3  PLT 357 318 318 [id number]   Basic Metabolic Panel: Recent Labs  Lab 11/18/19 1005 11/19/19 0451 11/20/19 0652  11/21/19 0459  NA 141 137 136 135  K 4.6 5.1 4.5 5.1  CL 106 107 107 104  CO2 22 23 20* 22  GLUCOSE 174* 217* 217* 289*  BUN 43* 47* 47* 54*  CREATININE 2.20* 1.55* 1.24* 1.42*  CALCIUM 8.7* 8.2* 8.3* 8.4*  MG  --   --  1.9 2.5*   GFR: Estimated Creatinine Clearance: 42.1 mL/min (A) (by C-G formula based on SCr of 1.42 mg/dL (H)). Liver Function Tests: Recent Labs  Lab 11/18/19 1005  AST 22  ALT 16  ALKPHOS 73  BILITOT 0.6  PROT 6.8  ALBUMIN 3.5   Recent Labs  Lab 11/18/19 1005  LIPASE 22   No results for input(s): AMMONIA in the last 168 hours. Coagulation Profile: No results for input(s): INR, PROTIME in the last 168 hours. Cardiac Enzymes: No results for input(s): CKTOTAL, CKMB, CKMBINDEX, TROPONINI in the last 168 hours. BNP (last 3 results) No results for input(s): PROBNP in the last 8760 hours. HbA1C: No results for input(s): HGBA1C in the last 72 hours. CBG: Recent Labs  Lab 11/20/19 1140 11/20/19 1637 11/20/19 1954 11/21/19 0754 11/21/19 1216  GLUCAP 237* 228* 354* 227* 311*   Lipid Profile: No results for input(s): CHOL, HDL, LDLCALC, TRIG, CHOLHDL, LDLDIRECT in the last 72 hours. Thyroid Function Tests: No results for input(s): TSH, T4TOTAL, FREET4, T3FREE, THYROIDAB in the last 72 hours. Anemia Panel: No results for input(s): VITAMINB12, FOLATE, FERRITIN, TIBC, IRON, RETICCTPCT in the last 72  hours. Sepsis Labs: No results for input(s): PROCALCITON, LATICACIDVEN in the last 168 hours.  Recent Results (from the past 240 hour(s))  Respiratory Panel by RT PCR (Flu A&B, Covid) - Nasopharyngeal Swab     Status: None   Collection Time: 11/18/19  2:07 PM   Specimen: Nasopharyngeal Swab  Result Value Ref Range Status   SARS Coronavirus 2 by RT PCR NEGATIVE NEGATIVE Final    Comment: (NOTE) SARS-CoV-2 target nucleic acids are NOT DETECTED. The SARS-CoV-2 RNA is generally detectable in upper respiratoy specimens during the acute phase of infection.  The lowest concentration of SARS-CoV-2 viral copies this assay can detect is 131 copies/mL. A negative result does not preclude SARS-Cov-2 infection and should not be used as the sole basis for treatment or other patient management decisions. A negative result may occur with  improper specimen collection/handling, submission of specimen other than nasopharyngeal swab, presence of viral mutation(s) within the areas targeted by this assay, and inadequate number of viral copies (<131 copies/mL). A negative result must be combined with clinical observations, patient history, and epidemiological information. The expected result is Negative. Fact Sheet for Patients:  PinkCheek.be Fact Sheet for Healthcare Providers:  GravelBags.it This test is not yet ap proved or cleared by the [place] FDA and  has been authorized for detection and/or diagnosis of SARS-CoV-2 by FDA under an Emergency Use Authorization (EUA). This EUA will remain  in effect (meaning this test can be used) for the duration of the COVID-19 declaration under Section 564(b)(1) of the Act, 21 U.S.C. section 360bbb-3(b)(1), unless the authorization is terminated or revoked sooner.    Influenza A by PCR NEGATIVE NEGATIVE Final   Influenza B by PCR NEGATIVE NEGATIVE Final    Comment: (NOTE) The Xpert Xpress SARS-CoV-2/FLU/RSV assay is intended as an aid in  the diagnosis of influenza from Nasopharyngeal swab specimens and  should not be used as a sole basis for treatment. Nasal washings and  aspirates are unacceptable for Xpert Xpress SARS-CoV-2/FLU/RSV  testing. Fact Sheet for Patients: PinkCheek.be Fact Sheet for Healthcare Providers: GravelBags.it This test is not yet approved or cleared by the [place] FDA and  has been authorized for detection and/or diagnosis of SARS-CoV-2 by  FDA under an  Emergency Use Authorization (EUA). This EUA will remain  in effect (meaning this test can be used) for the duration of the  Covid-19 declaration under Section 564(b)(1) of the Act, 21  U.S.C. section 360bbb-3(b)(1), unless the authorization is  terminated or revoked. Performed at [facility], [address], [place], [place] [address]          Radiology Studies: No results found.      Scheduled Meds: . allopurinol  100 mg Oral Daily  . dextromethorphan-guaiFENesin  1 tablet Oral BID  . diclofenac Sodium  2 g Topical QID  . DULoxetine  30 mg Oral Daily  . heparin injection (subcutaneous)  5,000 Units Subcutaneous Q8H  . insulin aspart  0-5 Units Subcutaneous QHS  . insulin aspart  0-9 Units Subcutaneous TID WC  . insulin aspart  3 Units Subcutaneous TID WC  . insulin detemir  8 Units Subcutaneous BID  . ipratropium-albuterol  3 mL Nebulization TID  . losartan  50 mg Oral Daily  . methylPREDNISolone (SOLU-MEDROL) injection  60 mg Intravenous Q12H  . mometasone-formoterol  2 puff Inhalation BID  . oxyCODONE-acetaminophen  2 tablet Oral Once  . pantoprazole  40 mg Oral Daily  . polyethylene glycol  17 g  Oral Daily  . simvastatin  20 mg Oral Daily  . sodium chloride flush  10 mL Intravenous Q12H   Continuous Infusions:   LOS: 2 days    Time spent: 35 minutes    Ezekiel Slocumb, DO [facility]   If 7PM-7AM, please contact night-coverage [email or web address] 11/21/2019, 2:55 PM

## 2019-11-22 DIAGNOSIS — N179 Acute kidney failure, unspecified: Secondary | ICD-10-CM

## 2019-11-22 DIAGNOSIS — N1831 Chronic kidney disease, stage 3a: Secondary | ICD-10-CM

## 2019-11-22 LAB — POTASSIUM: Potassium: 5.5 mmol/L — ABNORMAL HIGH (ref 3.5–5.1)

## 2019-11-22 LAB — GLUCOSE, CAPILLARY
Glucose-Capillary: 210 mg/dL — ABNORMAL HIGH (ref 70–99)
Glucose-Capillary: 236 mg/dL — ABNORMAL HIGH (ref 70–99)
Glucose-Capillary: 270 mg/dL — ABNORMAL HIGH (ref 70–99)
Glucose-Capillary: 174 mg/dL — ABNORMAL HIGH (ref 70–99)
Glucose-Capillary: 278 mg/dL — ABNORMAL HIGH (ref 70–99)

## 2019-11-22 LAB — FERRITIN: Ferritin: 68 ng/mL (ref 11–307)

## 2019-11-22 LAB — BASIC METABOLIC PANEL
Anion gap: 10 (ref 5–15)
BUN: 62 mg/dL — ABNORMAL HIGH (ref 8–23)
CO2: 23 mmol/L (ref 22–32)
Calcium: 8.6 mg/dL — ABNORMAL LOW (ref 8.9–10.3)
Chloride: 105 mmol/L (ref 98–111)
Creatinine, Ser: 1.72 mg/dL — ABNORMAL HIGH (ref 0.44–1.00)
GFR calc Af Amer: 35 mL/min — ABNORMAL LOW (ref >60)
GFR calc non Af Amer: 30 mL/min — ABNORMAL LOW (ref >60)
Glucose, Bld: 261 mg/dL — ABNORMAL HIGH (ref 70–99)
Potassium: 5.7 mmol/L — ABNORMAL HIGH (ref 3.5–5.1)
Sodium: 138 mmol/L (ref 135–145)

## 2019-11-22 LAB — CBC
HCT: 31.8 % — ABNORMAL LOW (ref 36.0–46.0)
Hemoglobin: 9.9 g/dL — ABNORMAL LOW (ref 12.0–15.0)
MCH: 28.2 pg (ref 26.0–34.0)
MCHC: 31.1 g/dL (ref 30.0–36.0)
MCV: 90.6 fL (ref 80.0–100.0)
Platelets: 341 10*3/uL (ref 150–400)
RBC: 3.51 MIL/uL — ABNORMAL LOW (ref 3.87–5.11)
RDW: 16.1 % — ABNORMAL HIGH (ref 11.5–15.5)
WBC: 19.3 10*3/uL — ABNORMAL HIGH (ref 4.0–10.5)
nRBC: 0 % (ref 0.0–0.2)

## 2019-11-22 LAB — IRON AND TIBC
Iron: 28 ug/dL (ref 28–170)
Saturation Ratios: 7 % — ABNORMAL LOW (ref 10.4–31.8)
TIBC: 379 ug/dL (ref 250–450)
UIBC: 351 ug/dL

## 2019-11-22 LAB — MAGNESIUM: Magnesium: 2.6 mg/dL — ABNORMAL HIGH (ref 1.7–2.4)

## 2019-11-22 MED ORDER — SODIUM CHLORIDE 0.9 % IV SOLN: INTRAVENOUS | Status: DC

## 2019-11-22 MED ORDER — CALCIUM GLUCONATE-NACL 1-0.675 GM/50ML-% IV SOLN
1.0000 g | Freq: Once | INTRAVENOUS | Status: AC
  Administered 2019-11-22: 1000 mg via INTRAVENOUS
  Filled 2019-11-22: qty 50

## 2019-11-22 MED ORDER — INSULIN ASPART 100 UNIT/ML ~~LOC~~ SOLN
7.0000 [IU] | Freq: Three times a day (TID) | SUBCUTANEOUS | Status: DC
  Administered 2019-11-22 – 2019-11-24 (×5): 7 [IU] via SUBCUTANEOUS
  Filled 2019-11-22 (×5): qty 1

## 2019-11-22 MED ORDER — SODIUM ZIRCONIUM CYCLOSILICATE 5 G PO PACK
5.0000 g | PACK | Freq: Three times a day (TID) | ORAL | Status: AC
  Administered 2019-11-22 – 2019-11-23 (×6): 5 g via ORAL
  Filled 2019-11-22 (×6): qty 1

## 2019-11-22 MED ORDER — INSULIN REGULAR HUMAN 100 UNIT/ML IJ SOLN
10.0000 [IU] | Freq: Once | INTRAMUSCULAR | Status: AC
  Administered 2019-11-22: 08:00:00 10 [IU] via INTRAVENOUS
  Filled 2019-11-22: qty 10

## 2019-11-22 MED ORDER — DEXTROSE 50 % IV SOLN
25.0000 g | Freq: Once | INTRAVENOUS | Status: AC
  Administered 2019-11-22: 25 g via INTRAVENOUS
  Filled 2019-11-22: qty 50

## 2019-11-22 NOTE — Progress Notes (Signed)
Flushed patient's IV prior to administering Dex and insulin.  No Complications at the time.  Administered Dextrose IV and Insulin.  Post Dextrose- patient started complaining it was burning.     New IV placed in left forearm to give Calcium gluconate.    Rechecked Blood sugar 15 minutes later:    236

## 2019-11-22 NOTE — Consult Note (Signed)
[facility] CONSULT NOTE    Date: 11/22/2019                  Patient Name:  [name] [name]  MRN: [id number]  DOB: October 13, 1950  Age / Sex: 69 y.o., female         PCP: Tracie Harrier, MD                 Service Requesting Consult:  Hospitalist                 Reason for Consult:  Acute kidney injury            History of Present Illness: Patient is a 69 y.o. female with a PMHx of COPD, diabetes mellitus type 2, GERD, hyperlipidemia, hypertension, peripheral neuropathy, osteoporosis, pulmonary fibrosis, sleep apnea, chronic kidney disease stage III yea baseline creatinine 1.4, who was admitted to [facility] on 11/18/2019 for evaluation of shortness of breath.  Patient recently experienced a fall which caused rib fractures on 11/11/2019.  Patient had CT of the chest, abdomen, and pelvis performed.  This was performed without contrast.  We are asked to see her now for acute kidney injury in the setting of known chronic kidney disease stage IIIa.  Baseline creatinine appears to be 1.4.  Creatinine now up to 1.72.  BUN also higher at 62.   Medications: Outpatient medications: Medications Prior to Admission  Medication Sig Dispense Refill Last Dose  . allopurinol (ZYLOPRIM) 100 MG tablet Take 100 mg by mouth daily.   11/17/2019 at 1200  . budesonide-formoterol (SYMBICORT) 160-4.5 MCG/ACT inhaler Inhale 2 puffs into the lungs 2 (two) times daily.   11/17/2019 at 1200  . cyclobenzaprine (FLEXERIL) 5 MG tablet Take 5 mg by mouth 2 (two) times daily as needed for muscle spasms.   Unknown at PRN  . DULoxetine (CYMBALTA) 30 MG capsule Take 30 mg by mouth daily.   11/17/2019 at 1200  . esomeprazole (NEXIUM) 40 MG capsule Take 40 mg by mouth daily.    11/17/2019 at 1200  . glimepiride (AMARYL) 2 MG tablet Take 2 mg by mouth every morning.   11/17/2019 at 1200  . losartan (COZAAR) 50 MG tablet Take 50 mg by mouth daily.   11/17/2019 at 1200  . metFORMIN (GLUCOPHAGE) 500 MG tablet Take 500 mg by  mouth 2 (two) times daily with a meal.   11/17/2019 at 1200  . oxyCODONE (ROXICODONE) 5 MG immediate release tablet Take 1 tablet (5 mg total) by mouth every 4 (four) hours as needed for severe pain. (Patient taking differently: Take 5 mg by mouth 2 (two) times daily as needed for severe pain. ) 30 tablet 0 Past Week at PRN  . predniSONE (DELTASONE) 5 MG tablet Take 5 mg by mouth daily.   11/17/2019 at 1200  . simvastatin (ZOCOR) 20 MG tablet Take 20 mg by mouth daily.   11/17/2019 at Unknown time    Current medications: Current Facility-Administered Medications  Medication Dose Route Frequency Provider Last Rate Last Admin  . 0.9 %  sodium chloride infusion   Intravenous Continuous Nicole Kindred A, DO      . albuterol (PROVENTIL) (2.5 MG/3ML) 0.083% nebulizer solution 2.5 mg  2.5 mg Inhalation Q3H PRN Nicole Kindred A, DO      . allopurinol (ZYLOPRIM) tablet 100 mg  100 mg Oral Daily Ivor Costa, MD   100 mg at 11/22/19 1054  . bisacodyl (DULCOLAX) suppository 10 mg  10 mg Rectal  Daily PRN Nicole Kindred A, DO      . dextromethorphan-guaiFENesin (MUCINEX DM) 30-600 MG per 12 hr tablet 1 tablet  1 tablet Oral BID Ivor Costa, MD   1 tablet at 11/22/19 1055  . diclofenac Sodium (VOLTAREN) 1 % topical gel 2 g  2 g Topical QID Nicole Kindred A, DO   2 g at 11/22/19 1302  . DULoxetine (CYMBALTA) DR capsule 30 mg  30 mg Oral Daily Ivor Costa, MD   30 mg at 11/22/19 1054  . fentaNYL (SUBLIMAZE) injection 12.5 mcg  12.5 mcg Intravenous Q2H PRN Ivor Costa, MD      . heparin injection 5,000 Units  5,000 Units Subcutaneous Q8H Nicole Kindred A, DO   5,000 Units at 11/22/19 1458  . hydrALAZINE (APRESOLINE) tablet 25 mg  25 mg Oral TID PRN Ivor Costa, MD      . insulin aspart (novoLOG) injection 0-5 Units  0-5 Units Subcutaneous QHS Ivor Costa, MD   3 Units at 11/21/19 2006  . insulin aspart (novoLOG) injection 0-9 Units  0-9 Units Subcutaneous TID WC Ivor Costa, MD   5 Units at 11/22/19 1302  . insulin  aspart (novoLOG) injection 7 Units  7 Units Subcutaneous TID WC Nicole Kindred A, DO      . insulin detemir (LEVEMIR) injection 8 Units  8 Units Subcutaneous BID Nicole Kindred A, DO   8 Units at 11/22/19 1053  . ipratropium-albuterol (DUONEB) 0.5-2.5 (3) MG/3ML nebulizer solution 3 mL  3 mL Nebulization TID Nicole Kindred A, DO   3 mL at 11/22/19 1402  . methocarbamol (ROBAXIN) tablet 500 mg  500 mg Oral Q8H PRN Ivor Costa, MD      . metoprolol tartrate (LOPRESSOR) injection 2.5 mg  2.5 mg Intravenous Q3H PRN Ivor Costa, MD   2.5 mg at 11/20/19 1950  . mometasone-formoterol (DULERA) 200-5 MCG/ACT inhaler 2 puff  2 puff Inhalation BID Ivor Costa, MD   2 puff at 11/22/19 1056  . morphine 4 MG/ML injection 4 mg  4 mg Intravenous Q3H PRN Nicole Kindred A, DO   4 mg at 11/21/19 2133  . ondansetron (ZOFRAN) injection 4 mg  4 mg Intravenous Q8H PRN Ivor Costa, MD      . oxyCODONE (Oxy IR/ROXICODONE) immediate release tablet 5 mg  5 mg Oral BID PRN Ivor Costa, MD   5 mg at 11/22/19 1134  . oxyCODONE-acetaminophen (PERCOCET/ROXICET) 5-325 MG per tablet 2 tablet  2 tablet Oral Once Ivor Costa, MD      . pantoprazole (PROTONIX) EC tablet 40 mg  40 mg Oral Daily Ivor Costa, MD   40 mg at 11/22/19 1054  . polyethylene glycol (MIRALAX / GLYCOLAX) packet 17 g  17 g Oral Daily Nicole Kindred A, DO   17 g at 11/22/19 1055  . predniSONE (DELTASONE) tablet 60 mg  60 mg Oral Q breakfast Nicole Kindred A, DO   60 mg at 11/22/19 1054  . simvastatin (ZOCOR) tablet 20 mg  20 mg Oral Daily Ivor Costa, MD   20 mg at 11/21/19 1753  . sodium chloride flush (NS) 0.9 % injection 10 mL  10 mL Intravenous Q12H Ivor Costa, MD   10 mL at 11/22/19 1056  . sodium zirconium cyclosilicate (LOKELMA) packet 5 g  5 g Oral TID Sharion Settler, NP   5 g at 11/22/19 1055      Allergies: Allergies  Allergen Reactions  . Acetaminophen-Codeine Other (See Comments)    Other reaction(s): Unknown   .  Codeine Nausea And Vomiting  .  Furosemide Other (See Comments)  . Penicillin V Potassium     Other reaction(s): Unknown  . Quinine     Other reaction(s): Unknown  . Tramadol     Other reaction(s): Unknown  . Tylenol [Acetaminophen]     Tylenol-Codeine: Dizziness      Past Medical History: Past Medical History:  Diagnosis Date  . Anemia   . Asthma   . Chronic airway obstruction ([place])   . Diabetes mellitus without complication ([place])   . GERD (gastroesophageal reflux disease)   . History of adenomatous polyp of colon   . History of bone density study   . Hyperlipidemia   . Hypertension   . Idiopathic peripheral neuropathy   . Morbid obesity ([place])   . Osteoporosis   . Plantar fascial fibromatosis   . Pulmonary fibrosis ([place])   . Sleep apnea    Hypersomnia with sleep apnea  . TIA (transient ischemic attack)      Past Surgical History: Past Surgical History:  Procedure Laterality Date  . ABDOMINAL HYSTERECTOMY    . BACK SURGERY    . BLADDER SUSPENSION    . CESAREAN SECTION    . COLONOSCOPY    . DILATION AND CURETTAGE OF UTERUS    . ESOPHAGOGASTRODUODENOSCOPY    . LUMBAR LAMINECTOMY/DECOMPRESSION MICRODISCECTOMY N/A 10/15/2016   Procedure: L3-L5 Laminectomy ;  Surgeon: Deetta Perla, MD;  Location: [facility] ORS;  Service: Neurosurgery;  Laterality: N/A;  . LUMBAR WOUND DEBRIDEMENT N/A 11/05/2016   Procedure: LUMBAR WOUND DEBRIDEMENT;  Surgeon: Deetta Perla, MD;  Location: [facility] ORS;  Service: Neurosurgery;  Laterality: N/A;  . TUBAL LIGATION       Family History: Family History  Problem Relation Age of Onset  . Stroke Mother   . Heart attack Mother   . Breast cancer Sister 69  . Stroke Sister   . Heart attack Sister   . Breast cancer Cousin        maternal side 60's     Social History: Social History   Socioeconomic History  . Marital status: Single    Spouse name: Not on file  . Number of children: Not on file  . Years of education: Not on file  . Highest education level: Not on file   Occupational History  . Not on file  Tobacco Use  . Smoking status: Former Research scientist (life sciences)  . Smokeless tobacco: Never Used  Substance and Sexual Activity  . Alcohol use: No  . Drug use: No  . Sexual activity: Not on file  Other Topics Concern  . Not on file  Social History Narrative  . Not on file   Social Determinants of Health   Financial Resource Strain:   . Difficulty of Paying Living Expenses: Not on file  Food Insecurity:   . Worried About Charity fundraiser in the Last Year: Not on file  . Ran Out of Food in the Last Year: Not on file  Transportation Needs:   . Lack of Transportation (Medical): Not on file  . Lack of Transportation (Non-Medical): Not on file  Physical Activity:   . Days of Exercise per Week: Not on file  . Minutes of Exercise per Session: Not on file  Stress:   . Feeling of Stress : Not on file  Social Connections:   . Frequency of Communication with Friends and Family: Not on file  . Frequency of Social Gatherings with Friends and Family: Not on file  .  Attends Religious Services: Not on file  . Active Member of Clubs or Organizations: Not on file  . Attends Archivist Meetings: Not on file  . Marital Status: Not on file  Intimate Partner Violence:   . Fear of Current or Ex-Partner: Not on file  . Emotionally Abused: Not on file  . Physically Abused: Not on file  . Sexually Abused: Not on file     Review of Systems: Review of Systems  Constitutional: Negative for chills, fever and malaise/fatigue.  HENT: Negative for congestion, hearing loss and tinnitus.   Eyes: Negative for blurred vision and double vision.  Respiratory: Positive for cough and shortness of breath. Negative for sputum production.   Cardiovascular: Negative for chest pain, palpitations and orthopnea.  Gastrointestinal: Negative for diarrhea, nausea and vomiting.  Genitourinary: Negative for dysuria, frequency and urgency.  Musculoskeletal: Negative for myalgias.   Skin: Negative for itching and rash.  Neurological: Negative for dizziness and focal weakness.  Endo/Heme/Allergies: Negative for polydipsia. Does not bruise/bleed easily.  Psychiatric/Behavioral: Negative for depression. The patient is not nervous/anxious.      Vital Signs: Blood pressure 135/77, pulse (!) 106, temperature 97.7 F (36.5 C), temperature source Oral, resp. rate 16, height 5\' 1"  (1.549 m), weight 99.5 kg, SpO2 98 %.  Weight trends: Filed Weights   11/18/19 0929 11/18/19 1632  Weight: 97.5 kg 99.5 kg    Physical Exam: General: NAD, no acute distress  Head: Normocephalic, atraumatic.  Eyes: Anicteric, EOMI  Nose: Mucous membranes moist, not inflammed, nonerythematous.  Throat: Oropharynx nonerythematous, no exudate appreciated.   Neck: Supple, trachea midline.  Lungs:  Normal respiratory effort. Clear to auscultation BL without crackles or wheezes.  Heart: RRR. S1 and S2 normal without gallop, murmur, or rubs.  Abdomen:  BS normoactive. Soft, Nondistended, non-tender.  No masses or organomegaly.  Extremities: No pretibial edema.  Neurologic: A&O X3, Motor strength is 5/5 in the all 4 extremities  Skin: No visible rashes, scars.    Lab results: Basic Metabolic Panel: Recent Labs  Lab 11/20/19 0652 11/20/19 0652 11/21/19 0459 11/22/19 0454 11/22/19 1000  NA 136  --  135 138  --   K 4.5   < > 5.1 5.7* 5.5*  CL 107  --  104 105  --   CO2 20*  --  22 23  --   GLUCOSE 217*  --  289* 261*  --   BUN 47*  --  54* 62*  --   CREATININE 1.24*  --  1.42* 1.72*  --   CALCIUM 8.3*  --  8.4* 8.6*  --   MG 1.9  --  2.5* 2.6*  --    < > = values in this interval not displayed.    Liver Function Tests: Recent Labs  Lab 11/18/19 1005  AST 22  ALT 16  ALKPHOS 73  BILITOT 0.6  PROT 6.8  ALBUMIN 3.5   Recent Labs  Lab 11/18/19 1005  LIPASE 22   No results for input(s): AMMONIA in the last 168 hours.  CBC: Recent Labs  Lab 11/18/19 1005 11/18/19 1005  11/19/19 0451 11/19/19 0451 11/20/19 0652 11/21/19 0459 11/22/19 0454  WBC 13.4*   < > 13.5*   < > 17.1* 16.4* 19.3*  NEUTROABS 9.9*  --   --   --  15.2* 15.2*  --   HGB 11.3*   < > 10.0*   < > 10.3* 10.2* 9.9*  HCT 36.9   < > 32.2*   < >  32.6* 31.7* 31.8*  MCV 92.5  --  91.7  --  89.3 89.3 90.6  PLT 357   < > 318   < > 318 332 341   < > = values in this interval not displayed.    Cardiac Enzymes: No results for input(s): CKTOTAL, CKMB, CKMBINDEX, TROPONINI in the last 168 hours.  BNP: Invalid input(s): POCBNP  CBG: Recent Labs  Lab 11/21/19 2052 11/22/19 0801 11/22/19 0903 11/22/19 1146 11/22/19 1617  GLUCAP 277* 210* 236* 270* 174*    Microbiology: Results for orders placed or performed during the hospital encounter of 11/18/19  Respiratory Panel by RT PCR (Flu A&B, Covid) - Nasopharyngeal Swab     Status: None   Collection Time: 11/18/19  2:07 PM   Specimen: Nasopharyngeal Swab  Result Value Ref Range Status   SARS Coronavirus 2 by RT PCR NEGATIVE NEGATIVE Final    Comment: (NOTE) SARS-CoV-2 target nucleic acids are NOT DETECTED. The SARS-CoV-2 RNA is generally detectable in upper respiratoy specimens during the acute phase of infection. The lowest concentration of SARS-CoV-2 viral copies this assay can detect is 131 copies/mL. A negative result does not preclude SARS-Cov-2 infection and should not be used as the sole basis for treatment or other patient management decisions. A negative result may occur with  improper specimen collection/handling, submission of specimen other than nasopharyngeal swab, presence of viral mutation(s) within the areas targeted by this assay, and inadequate number of viral copies (<131 copies/mL). A negative result must be combined with clinical observations, patient history, and epidemiological information. The expected result is Negative. Fact Sheet for Patients:  PinkCheek.be Fact Sheet for  Healthcare Providers:  GravelBags.it This test is not yet ap proved or cleared by the [place] FDA and  has been authorized for detection and/or diagnosis of SARS-CoV-2 by FDA under an Emergency Use Authorization (EUA). This EUA will remain  in effect (meaning this test can be used) for the duration of the COVID-19 declaration under Section 564(b)(1) of the Act, 21 U.S.C. section 360bbb-3(b)(1), unless the authorization is terminated or revoked sooner.    Influenza A by PCR NEGATIVE NEGATIVE Final   Influenza B by PCR NEGATIVE NEGATIVE Final    Comment: (NOTE) The Xpert Xpress SARS-CoV-2/FLU/RSV assay is intended as an aid in  the diagnosis of influenza from Nasopharyngeal swab specimens and  should not be used as a sole basis for treatment. Nasal washings and  aspirates are unacceptable for Xpert Xpress SARS-CoV-2/FLU/RSV  testing. Fact Sheet for Patients: PinkCheek.be Fact Sheet for Healthcare Providers: GravelBags.it This test is not yet approved or cleared by the [place] FDA and  has been authorized for detection and/or diagnosis of SARS-CoV-2 by  FDA under an Emergency Use Authorization (EUA). This EUA will remain  in effect (meaning this test can be used) for the duration of the  Covid-19 declaration under Section 564(b)(1) of the Act, 21  U.S.C. section 360bbb-3(b)(1), unless the authorization is  terminated or revoked. Performed at [facility], [address] [place], [place] [address]     Coagulation Studies: No results for input(s): LABPROT, INR in the last 72 hours.  Urinalysis: No results for input(s): COLORURINE, LABSPEC, PHURINE, GLUCOSEU, HGBUR, BILIRUBINUR, KETONESUR, PROTEINUR, UROBILINOGEN, NITRITE, LEUKOCYTESUR in the last 72 hours.  Invalid input(s): APPERANCEUR    Imaging:  No results found.   Assessment & Plan: Pt is a 69 y.o. female with a  PMHx of COPD, diabetes mellitus type 2, GERD, hyperlipidemia, hypertension, peripheral neuropathy,  osteoporosis, pulmonary fibrosis, sleep apnea, chronic kidney disease stage III yea baseline creatinine 1.4, who was admitted to [facility] on 11/18/2019 for evaluation of shortness of breath.  1.  Acute kidney injury/chronic kidney disease stage IIIA.  Baseline creatinine 1.4.  BUN and creatinine both up now at 62/1.72 respectively.  CT abdomen reviewed and negative for obstruction.  Continue IV fluid hydration with saline at current rate.  Reevaluate renal function tomorrow.  We will plan to check SPEP, UPEP, and ANA.  2.  Hyperkalemia.  Serum potassium a bit high at 5.5.  Agree with sodium zirconium 5 g p.o. 3 times daily.  Reevaluate serum potassium tomorrow.  3.  Anemia of chronic kidney disease.  Hemoglobin currently 9.9.  Check serum iron studies as well as SPEP and UPEP as above.

## 2019-11-22 NOTE — Progress Notes (Addendum)
Patient's HR jumps up when ambulating.   Have Metoprolol IV if sustains.   HR dropped back down to 120 once back in bed.  Pt does complain of SOB while ambulating    MEWs jumps up to a 3 due to increased HR with ambulation.  But once at rest- HR drops back down   Messaged MD-  MD is aware  Per MD- can wait 10 minutes - if heart rate does not drop below 125bpm- ok to give metoprolol.

## 2019-11-22 NOTE — Progress Notes (Signed)
Physical Therapy Treatment Patient Details Name: [name] [name] MRN: [id number] DOB: [birth year] Today's Date: 11/22/2019    History of Present Illness 69 y.o. female with medical history significant of hypertension, hyperlipidemia, diabetes mellitus, asthma, TIA, GERD, gout, depression, OSA, pulmonary fibrosis, anemia, chronic pain, aortic valve stenosis, lumbar stenosis, CKD stage IIIa, who presents with worsening shortness breath. Pt also experienced a fall 11/11/2019, discharged home, returned 11/14/2019 due to pain. This admission imaging showed "Rib fractures of varying ages bilaterally with acute/subacutefractures of left and right anterolateral ribs, associated withchest wall contusion or small hematoma along the right chest wall as described."    PT Comments    Patient agrees to PT treatment. She performs sit to stand from recliner with MI and RW. She begins with HR 119 and O2 saturation 95%. She ambulates 20 feet  with  RW and MI and her HR increases to 135 BPM and then after sitting it decreases to 120 bpm. She takes a rest period and then ambulates again 20 feet with MI and RW. Patient reports 5/10 pain to back and under her breast. She is left in her recliner with feet resting on bottom of tray and nurse is in her room. She will continue to benefit from skilled PT to improve mobility and strength.   Follow Up Recommendations  Home health PT;Supervision - Intermittent     Equipment Recommendations  None recommended by PT(has equipment at home)    Recommendations for Other Services       Precautions / Restrictions Precautions Precautions: Fall Restrictions Weight Bearing Restrictions: No    Mobility  Bed Mobility               General bed mobility comments: not performed as pt received in recliner  Transfers Overall transfer level: Modified independent Equipment used: Rolling walker (2 wheeled) Transfers: Sit to/from Stand Sit to Stand: Supervision         General  transfer comment: safe technique with upright posture noted. RW used for assistance  Ambulation/Gait Ambulation/Gait assistance: Supervision Gait Distance (Feet): 40 Feet Assistive device: Rolling walker (2 wheeled) Gait Pattern/deviations: Step-through pattern     General Gait Details: 2 bouts of ambulation performed in room with seated rest break in middle secondary to elevated HR. Reciprocal gait pattern noted and pt demonstrates safe technique   Stairs             Wheelchair Mobility    Modified Rankin (Stroke Patients Only)       Balance Overall balance assessment: Modified Independent Sitting-balance support: No upper extremity supported       Standing balance support: Bilateral upper extremity supported Standing balance-Leahy Scale: Good                              Cognition Arousal/Alertness: Awake/alert Behavior During Therapy: WFL for tasks assessed/performed Overall Cognitive Status: Within Functional Limits for tasks assessed                                        Exercises      General Comments        Pertinent Vitals/Pain Pain Assessment: 0-10 Pain Score: 5  Pain Location: (under her breast)    Home Living  Prior Function            PT Goals (current goals can now be found in the care plan section) Acute Rehab PT Goals Patient Stated Goal: to go home Progress towards PT goals: Progressing toward goals    Frequency    Min 2X/week      PT Plan Current plan remains appropriate    Co-evaluation              AM-PAC PT "6 Clicks" Mobility   Outcome Measure  Help needed turning from your back to your side while in a flat bed without using bedrails?: None Help needed moving from lying on your back to sitting on the side of a flat bed without using bedrails?: None Help needed moving to and from a bed to a chair (including a wheelchair)?: A Little Help needed  standing up from a chair using your arms (e.g., wheelchair or bedside chair)?: A Little Help needed to walk in hospital room?: A Little Help needed climbing 3-5 steps with a railing? : A Little 6 Click Score: 20    End of Session Equipment Utilized During Treatment: Gait belt Activity Tolerance: Patient tolerated treatment well Patient left: in chair;with chair alarm set;with call bell/phone within reach Nurse Communication: Mobility status PT Visit Diagnosis: Other abnormalities of gait and mobility (R26.89);Muscle weakness (generalized) (M62.81);Difficulty in walking, not elsewhere classified (R26.2);Pain Pain - Right/Left: Right     Time: 1500-1530 PT Time Calculation (min) (ACUTE ONLY): 30 min  Charges:  $Gait Training: 8-22 mins $Therapeutic Activity: 8-22 mins                        Alanson Puls, PT DPT  11/22/2019, 3:43 PM

## 2019-11-22 NOTE — Progress Notes (Signed)
Potassium this am 5.7, treatment with insulin dextrose, calcium gluconate, and lokelma, no changes on tele.

## 2019-11-22 NOTE — Progress Notes (Signed)
PROGRESS NOTE    [name] [name]  [id number] DOB: 05/05/1951 DOA: 11/18/2019  PCP: [name] Harrier, MD    LOS - 3   Brief Narrative:  [name] Figiel Smithis a 69 y.o.femalewith medical history significant ofhypertension, hyperlipidemia, diabetes mellitus, asthma, TIA, GERD, gout, depression, OSA, pulmonary fibrosis, anemia, chronic pain, aortic valve stenosis, CKD stage IIIa, who presented to the ED on 2/10with worseningshortness breath, dry cough and wheezing.Of note, patient has bilateral lower rib fractures after a mechanical fall at home on 2/3 and continues with significant pain on inspirations. In the ED, vitals stable. Labs showed leukocytosis. Covid-19 test negative. CXR clear of infiltrates. Patient admitted to hospitalist service with acute asthma exacerbation.  Subjective 2/14: Patient seen this morning at bedside.  No acute events were reported overnight.  She continues to have anterior rib cage pain.  With shortness of breath on exertion, but states this feels like it is now at her baseline.  Says she does get short of breath walking from room to room at home as well, does recover with rest.  She denies fevers or chills, chest pain, nausea vomiting diarrhea or other complaints.    We discussed that her renal function has worsened past 2 days and potassium high this morning, nephrology consulted.  She asked about going home.  Assessment & Plan:   Principal Problem:   Asthma exacerbation Active Problems:   Benign essential hypertension   Gastroesophageal reflux disease without esophagitis   Fall   Closed rib fracture   Pulmonary fibrosis (HCC)   Acute renal failure superimposed on stage 3a chronic kidney disease (HCC)   Acute Asthma Exacerbation in setting of underlying Pulmonary Fibrosis It appears the patient's rib cage pain from fractures after her fall are limiting her inspirations and at least partially contributing to her asthma exacerbation.  She continues  to get very dyspneic and tachycardic with any exertion, and slow to recover at rest.    Takes very shallow inspirations, secondary to pain. --Transition to oral prednisone 60 mg daily  --scheduled Duonebs q6h --PRN albuterol nebs q3h --Mucinex BID --cont Dulera (sub'd for home Symbicort) --supplemental oxygen if needed to keep sat >90% --flutter valve  Hyperkalemia -K5.7 this morning.  She received calcium gluconate, insulin/dextrose and Lokelma. This is likely due to worsening renal function and ARB.   --Hold losartan --Follow-up repeat potassium level --Nephrology to see  Acute renal failure superimposed on stage 3a chronic kidney disease ([place]): Baseline Cre is1.02-1.5.Creatinine 2.20on admission,likely due to dehydration and continuation of ARB.  AKI had nearly resolved with IV hydration, but renal function has worsened again over the past couple of days, with associated hyperkalemia this morning. --Nephrology consulted, appreciate recs --follow BMP daily --Avoid renal toxicagents and hypotension --Hold losartan again  Sinus tachycardia -secondary to above and pain with rib fractures.   --Monitor on telemetry --No need to treat for rate control unless A. Fib/flutter, or sustaining over 120 at rest  Leukocytosis - increasing likely due to steroids. No fever/chills or other signs of infection at this time. --monitor CBC and clinical status  Type 2 Diabetes with hyperglycemia - due to steroids. A1C 8.4% on admission. --increase Levemir to 8 units BID --Increase mealtime Novolog to 7 units TID WC --Continue sliding scale Novolog  Hypertension- chronic, stable --resumelosartan  --Hydralazine prn  Gastroesophageal reflux disease without esophagitis -continue homePPI  MechanicalFall -PT/OT  Closed rib fractures(due to fall 11/11/19) --prnoxycodone, fentanyl, Robaxin --Incentive spirometry   Incidental findings of CT scan:CT scan showed pancreatic  head calcifications may be indicative of prior pancreatitis. New areas of low attenuation in the pancreas have developed since previous imaging studies. 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 -this must be f/u with PCP -will check lipase    DVT prophylaxis:heparin Code Status: Full Code Family Communication:none at bedside  Disposition [name] home with home health PT and OT tomorrow, 2/15, pending improvement in potassium and renal function.  Respiratory status is stable.  Nephrology consult pending. Coming From- home, independent Exp DC Date2/15 Barriers- clinical improvement, HH services Medically Stable for Discharge?No    Objective: Vitals:   11/21/19 2152 11/22/19 0415 11/22/19 0757 11/22/19 1146  BP:  108/67 (!) 116/92 127/68  Pulse: (!) 110 99 (!) 108 (!) 110  Resp:  20 16 16   Temp:  98 F (36.7 C) 97.8 F (36.6 C) 97.7 F (36.5 C)  TempSrc:  Oral Oral Oral  SpO2:  98% 100% 98%  Weight:      Height:        Intake/Output Summary (Last 24 hours) at 11/22/2019 1312 Last data filed at 11/22/2019 0930 Gross per 24 hour  Intake 290 ml  Output 0 ml  Net 290 ml   Filed Weights   11/18/19 0929 11/18/19 1632  Weight: 97.5 kg 99.5 kg    Examination:  General exam: awake, alert, no acute distress, morbidly obese HEENT: moist mucus membranes, hearing grossly normal  Respiratory system: CTAB, decreased at bases, no wheezes, rales or rhonchi, normal respiratory effort at rest. Cardiovascular system: normal S1/S2, RRR, no JVD, murmurs, rubs, gallops, no pedal edema.   Central nervous system: A&O x4. no gross focal neurologic deficits, normal speech Extremities: moves all, no edema, normal tone Psychiatry: normal mood, congruent affect, judgement and insight appear normal    Data Reviewed: I have personally reviewed following labs and imaging studies  CBC: Recent Labs   Lab 11/18/19 1005 11/19/19 0451 11/20/19 0652 11/21/19 0459 11/22/19 0454  WBC 13.4* 13.5* 17.1* 16.4* 19.3*  NEUTROABS 9.9*  --  15.2* 15.2*  --   HGB 11.3* 10.0* 10.3* 10.2* 9.9*  HCT 36.9 32.2* 32.6* 31.7* 31.8*  MCV 92.5 91.7 89.3 89.3 90.6  PLT 357 318 318 332 [id number]   Basic Metabolic Panel: Recent Labs  Lab 11/18/19 1005 11/18/19 1005 11/19/19 0451 11/20/19 0652 11/21/19 0459 11/22/19 0454 11/22/19 1000  NA 141  --  137 136 135 138  --   K 4.6   < > 5.1 4.5 5.1 5.7* 5.5*  CL 106  --  107 107 104 105  --   CO2 22  --  23 20* 22 23  --   GLUCOSE 174*  --  217* 217* 289* 261*  --   BUN 43*  --  47* 47* 54* 62*  --   CREATININE 2.20*  --  1.55* 1.24* 1.42* 1.72*  --   CALCIUM 8.7*  --  8.2* 8.3* 8.4* 8.6*  --   MG  --   --   --  1.9 2.5* 2.6*  --    < > = values in this interval not displayed.   GFR: Estimated Creatinine Clearance: 34.8 mL/min (A) (by C-G formula based on SCr of 1.72 mg/dL (H)). Liver Function Tests: Recent Labs  Lab 11/18/19 1005  AST 22  ALT 16  ALKPHOS 73  BILITOT 0.6  PROT 6.8  ALBUMIN 3.5   Recent Labs  Lab 11/18/19 1005  LIPASE  22   No results for input(s): AMMONIA in the last 168 hours. Coagulation Profile: No results for input(s): INR, PROTIME in the last 168 hours. Cardiac Enzymes: No results for input(s): CKTOTAL, CKMB, CKMBINDEX, TROPONINI in the last 168 hours. BNP (last 3 results) No results for input(s): PROBNP in the last 8760 hours. HbA1C: No results for input(s): HGBA1C in the last 72 hours. CBG: Recent Labs  Lab 11/21/19 2000 11/21/19 2052 11/22/19 0801 11/22/19 0903 11/22/19 1146  GLUCAP 252* 277* 210* 236* 270*   Lipid Profile: No results for input(s): CHOL, HDL, LDLCALC, TRIG, CHOLHDL, LDLDIRECT in the last 72 hours. Thyroid Function Tests: No results for input(s): TSH, T4TOTAL, FREET4, T3FREE, THYROIDAB in the last 72 hours. Anemia Panel: No results for input(s): VITAMINB12, FOLATE, FERRITIN, TIBC,  IRON, RETICCTPCT in the last 72 hours. Sepsis Labs: No results for input(s): PROCALCITON, LATICACIDVEN in the last 168 hours.  Recent Results (from the past 240 hour(s))  Respiratory Panel by RT PCR (Flu A&B, Covid) - Nasopharyngeal Swab     Status: None   Collection Time: 11/18/19  2:07 PM   Specimen: Nasopharyngeal Swab  Result Value Ref Range Status   SARS Coronavirus 2 by RT PCR NEGATIVE NEGATIVE Final    Comment: (NOTE) SARS-CoV-2 target nucleic acids are NOT DETECTED. The SARS-CoV-2 RNA is generally detectable in upper respiratoy specimens during the acute phase of infection. The lowest concentration of SARS-CoV-2 viral copies this assay can detect is 131 copies/mL. A negative result does not preclude SARS-Cov-2 infection and should not be used as the sole basis for treatment or other patient management decisions. A negative result may occur with  improper specimen collection/handling, submission of specimen other than nasopharyngeal swab, presence of viral mutation(s) within the areas targeted by this assay, and inadequate number of viral copies (<131 copies/mL). A negative result must be combined with clinical observations, patient history, and epidemiological information. The expected result is Negative. Fact Sheet for Patients:  PinkCheek.be Fact Sheet for Healthcare Providers:  GravelBags.it This test is not yet ap proved or cleared by the [place] FDA and  has been authorized for detection and/or diagnosis of SARS-CoV-2 by FDA under an Emergency Use Authorization (EUA). This EUA will remain  in effect (meaning this test can be used) for the duration of the COVID-19 declaration under Section 564(b)(1) of the Act, 21 U.S.C. section 360bbb-3(b)(1), unless the authorization is terminated or revoked sooner.    Influenza A by PCR NEGATIVE NEGATIVE Final   Influenza B by PCR NEGATIVE NEGATIVE Final    Comment:  (NOTE) The Xpert Xpress SARS-CoV-2/FLU/RSV assay is intended as an aid in  the diagnosis of influenza from Nasopharyngeal swab specimens and  should not be used as a sole basis for treatment. Nasal washings and  aspirates are unacceptable for Xpert Xpress SARS-CoV-2/FLU/RSV  testing. Fact Sheet for Patients: PinkCheek.be Fact Sheet for Healthcare Providers: GravelBags.it This test is not yet approved or cleared by the [place] FDA and  has been authorized for detection and/or diagnosis of SARS-CoV-2 by  FDA under an Emergency Use Authorization (EUA). This EUA will remain  in effect (meaning this test can be used) for the duration of the  Covid-19 declaration under Section 564(b)(1) of the Act, 21  U.S.C. section 360bbb-3(b)(1), unless the authorization is  terminated or revoked. Performed at [facility], [address] [place], [place] [address]          Radiology Studies: No results found.  Scheduled Meds: . allopurinol  100 mg Oral Daily  . dextromethorphan-guaiFENesin  1 tablet Oral BID  . diclofenac Sodium  2 g Topical QID  . DULoxetine  30 mg Oral Daily  . heparin injection (subcutaneous)  5,000 Units Subcutaneous Q8H  . insulin aspart  0-5 Units Subcutaneous QHS  . insulin aspart  0-9 Units Subcutaneous TID WC  . insulin aspart  3 Units Subcutaneous TID WC  . insulin detemir  8 Units Subcutaneous BID  . ipratropium-albuterol  3 mL Nebulization TID  . mometasone-formoterol  2 puff Inhalation BID  . oxyCODONE-acetaminophen  2 tablet Oral Once  . pantoprazole  40 mg Oral Daily  . polyethylene glycol  17 g Oral Daily  . predniSONE  60 mg Oral Q breakfast  . simvastatin  20 mg Oral Daily  . sodium chloride flush  10 mL Intravenous Q12H  . sodium zirconium cyclosilicate  5 g Oral TID   Continuous Infusions:   LOS: 3 days    Time spent: 35 minutes    Ezekiel Slocumb, DO [facility]   If 7PM-7AM, please contact night-coverage [email or web address] 11/22/2019, 1:12 PM

## 2019-11-23 LAB — BASIC METABOLIC PANEL
Anion gap: 8 (ref 5–15)
BUN: 66 mg/dL — ABNORMAL HIGH (ref 8–23)
CO2: 22 mmol/L (ref 22–32)
Calcium: 8.3 mg/dL — ABNORMAL LOW (ref 8.9–10.3)
Chloride: 105 mmol/L (ref 98–111)
Creatinine, Ser: 1.73 mg/dL — ABNORMAL HIGH (ref 0.44–1.00)
GFR calc Af Amer: 35 mL/min — ABNORMAL LOW (ref >60)
GFR calc non Af Amer: 30 mL/min — ABNORMAL LOW (ref >60)
Glucose, Bld: 184 mg/dL — ABNORMAL HIGH (ref 70–99)
Potassium: 4.8 mmol/L (ref 3.5–5.1)
Sodium: 135 mmol/L (ref 135–145)

## 2019-11-23 LAB — CBC
HCT: 30.4 % — ABNORMAL LOW (ref 36.0–46.0)
Hemoglobin: 9.6 g/dL — ABNORMAL LOW (ref 12.0–15.0)
MCH: 28.2 pg (ref 26.0–34.0)
MCHC: 31.6 g/dL (ref 30.0–36.0)
MCV: 89.4 fL (ref 80.0–100.0)
Platelets: 332 10*3/uL (ref 150–400)
RBC: 3.4 MIL/uL — ABNORMAL LOW (ref 3.87–5.11)
RDW: 16 % — ABNORMAL HIGH (ref 11.5–15.5)
WBC: 18.1 10*3/uL — ABNORMAL HIGH (ref 4.0–10.5)
nRBC: 0.2 % (ref 0.0–0.2)

## 2019-11-23 LAB — GLUCOSE, CAPILLARY
Glucose-Capillary: 311 mg/dL — ABNORMAL HIGH (ref 70–99)
Glucose-Capillary: 145 mg/dL — ABNORMAL HIGH (ref 70–99)
Glucose-Capillary: 191 mg/dL — ABNORMAL HIGH (ref 70–99)
Glucose-Capillary: 208 mg/dL — ABNORMAL HIGH (ref 70–99)
Glucose-Capillary: 190 mg/dL — ABNORMAL HIGH (ref 70–99)

## 2019-11-23 LAB — PROTEIN / CREATININE RATIO, URINE
Creatinine, Urine: 150 mg/dL
Protein Creatinine Ratio: 0.11 mg/mg{Cre} (ref 0.00–0.15)
Total Protein, Urine: 17 mg/dL

## 2019-11-23 LAB — MAGNESIUM: Magnesium: 2.5 mg/dL — ABNORMAL HIGH (ref 1.7–2.4)

## 2019-11-23 MED ORDER — PREDNISONE 20 MG PO TABS
40.0000 mg | ORAL_TABLET | Freq: Every day | ORAL | Status: DC
  Administered 2019-11-24: 40 mg via ORAL
  Filled 2019-11-23: qty 2

## 2019-11-23 MED ORDER — FERROUS SULFATE 325 (65 FE) MG PO TABS
325.0000 mg | ORAL_TABLET | Freq: Two times a day (BID) | ORAL | Status: DC
  Administered 2019-11-23 – 2019-11-24 (×2): 325 mg via ORAL
  Filled 2019-11-23 (×2): qty 1

## 2019-11-23 NOTE — Progress Notes (Signed)
Physical Therapy Evaluation Patient Details Name: [name] [name] MRN: [id number] DOB: Feb 09, 1951 Today's Date: 11/23/2019   History of Present Illness  69 y.o. female with medical history significant of hypertension, hyperlipidemia, diabetes mellitus, asthma, TIA, GERD, gout, depression, OSA, pulmonary fibrosis, anemia, chronic pain, aortic valve stenosis, lumbar stenosis, CKD stage IIIa, who presents with worsening shortness breath. Pt also experienced a fall 11/11/2019, discharged home, returned 11/14/2019 due to pain. This admission imaging showed "Rib fractures of varying ages bilaterally with acute/subacutefractures of left and right anterolateral ribs, associated withchest wall contusion or small hematoma along the right chest wall as described."  Clinical Impression  Patient agrees to PT treatment. She is in bed and needs min assist for LE to move to the edge of the bed. She is MI for transfers sit to stand with RW and IV pole. Her HR is 119 and she ambulates 20 feet with RW and MI and her HR increases to 130 BPM. She rests for several minutes and her HR decreases to 120 BPM and she ambulates again with RW and HR increases to 130 BPM after 20 feet. She needs min assist for raising bLE back to the bed with sit to supine bed mobility. She will continue to benefit from skilled PT to improve gait and mobility.     Follow Up Recommendations Home health PT;Supervision - Intermittent    Equipment Recommendations  None recommended by PT(has equipment at home)    Recommendations for Other Services       Precautions / Restrictions Precautions Precautions: Fall Restrictions Weight Bearing Restrictions: No      Mobility  Bed Mobility Overal bed mobility: Needs Assistance Bed Mobility: Supine to Sit;Sit to Supine     Supine to sit: Min assist Sit to supine: Min assist      Transfers Overall transfer level: Modified independent Equipment used: Rolling walker (2 wheeled) Transfers: Sit  to/from Stand Sit to Stand: Supervision         General transfer comment: safe technique with upright posture noted. RW used for assistance  Ambulation/Gait Ambulation/Gait assistance: Supervision Gait Distance (Feet): 40 Feet Assistive device: Rolling walker (2 wheeled) Gait Pattern/deviations: Step-through pattern     General Gait Details: 2 bouts of ambulation performed in room with seated rest break in middle secondary to elevated HR. Reciprocal gait pattern noted and pt demonstrates safe technique  Stairs            Wheelchair Mobility    Modified Rankin (Stroke Patients Only)       Balance Overall balance assessment: Modified Independent Sitting-balance support: No upper extremity supported       Standing balance support: Bilateral upper extremity supported Standing balance-Leahy Scale: Good                               Pertinent Vitals/Pain Pain Assessment: No/denies pain Pain Score: 0-No pain    Home Living                        Prior Function                 Hand Dominance        Extremity/Trunk Assessment                Communication      Cognition Arousal/Alertness: Awake/alert Behavior During Therapy: WFL for tasks assessed/performed Overall Cognitive Status: Within Functional Limits for tasks  assessed                                        General Comments      Exercises     Assessment/Plan    PT Assessment    PT Problem List         PT Treatment Interventions      PT Goals (Current goals can be found in the Care Plan section)  Acute Rehab PT Goals Patient Stated Goal: to go home    Frequency Min 2X/week   Barriers to discharge        Co-evaluation               AM-PAC PT "6 Clicks" Mobility  Outcome Measure Help needed turning from your back to your side while in a flat bed without using bedrails?: None Help needed moving from lying on your back to  sitting on the side of a flat bed without using bedrails?: None Help needed moving to and from a bed to a chair (including a wheelchair)?: A Little Help needed standing up from a chair using your arms (e.g., wheelchair or bedside chair)?: A Little Help needed to walk in hospital room?: A Little Help needed climbing 3-5 steps with a railing? : A Little 6 Click Score: 20    End of Session Equipment Utilized During Treatment: Gait belt Activity Tolerance: Patient tolerated treatment well Patient left: in bed Nurse Communication: Mobility status PT Visit Diagnosis: Other abnormalities of gait and mobility (R26.89);Muscle weakness (generalized) (M62.81);Difficulty in walking, not elsewhere classified (R26.2);Pain Pain - Right/Left: Right    Time: 1430-1455 PT Time Calculation (min) (ACUTE ONLY): 25 min   Charges:     PT Treatments $Gait Training: 23-37 mins          Alanson Puls, PT DPT 11/23/2019, 3:26 PM

## 2019-11-23 NOTE — Progress Notes (Signed)
PROGRESS NOTE    [name] [name]  [id number] DOB: 1951-06-24 DOA: 11/18/2019  PCP: Tracie Harrier, MD    LOS - 4   Brief Narrative:  [name] Keimig Smithis a 69 y.o.femalewith medical history significant ofhypertension, hyperlipidemia, diabetes mellitus, asthma, TIA, GERD, gout, depression, OSA, pulmonary fibrosis, anemia, chronic pain, aortic valve stenosis, CKD stage IIIa, who presented to the ED on 2/10with worseningshortness breath, dry cough and wheezing.Of note, patient has bilateral lower rib fractures after a mechanical fall at home on 2/3 and continues with significant pain on inspirations. In the ED, vitals stable. Labs showed leukocytosis. Covid-19 test negative. CXR clear of infiltrates. Patient admitted to hospitalist service with acute asthma exacerbation.  Subjective 2/16: Patient seen this AM after breakfast.  Reports feeling well. Asks if her potassium and kidneys are better.  She says breathing feels at baseline, but stable dyspnea on exertion.  Still with rib cage pain, but slightly improving. Using incentive spirometer.  Assessment & Plan:   Principal Problem:   Asthma exacerbation Active Problems:   Benign essential hypertension   Gastroesophageal reflux disease without esophagitis   Fall   Closed rib fracture   Pulmonary fibrosis (HCC)   Acute renal failure superimposed on stage 3a chronic kidney disease (HCC)   Acute Asthma Exacerbation in setting of underlying Pulmonary Fibrosis It appears the patient's rib cage pain from fractures after her fall are limiting her inspirations and at least partially contributing to her asthma exacerbation. Improving with treatment and improvement in the rib pain.  Breathing a little deeper than she was. --Transition to oral prednisone 60 mg daily  --scheduled Duonebs q6h --PRN albuterol nebs q3h --Mucinex BID --cont Dulera (sub'd for home Symbicort) --supplemental oxygen if needed to keep sat >90% --incentive  spirometer 10x hourly  --flutter valve  Hyperkalemia - resolved.   This is likely due to worsening renal function and ARB.   --Hold losartan --continue lokelma --Nephrology following  Acute renal failure superimposed on stage 3a chronic kidney disease ([place]): Baseline Cre is1.02-1.5.Creatinine 2.20on admission,likely due to dehydration and continuation of ARB.  AKI had nearly resolved with IV hydration, but renal function has worsened again over the past couple of days, with associated hyperkalemia this morning.  Cr 1.70 past 2 days. --Nephrology consulted, appreciate recs --f/u pending lab eval --follow BMP daily --Avoid renal toxicagents and hypotension --Hold losartan again  Sinus tachycardia-secondary to above and pain with rib fractures.  Stable, probably her baseline, tachy on exertion. --Monitor on telemetry --No need to treat for rate control unless A. Fib/flutter,or sustaining over 120 at rest (>10 min)  Leukocytosis - likely due to steroids. No fever/chills or other signs of infection at this time. --monitor CBC and clinical status  Type 2 Diabetes with hyperglycemia - due to steroids. A1C 8.4% on admission. --increase Levemir to 8 units BID --Increase mealtime Novolog to 7 units TID WC --Continue sliding scale Novolog  Hypertension- chronic, stable --resumelosartan  --Hydralazine prn  Gastroesophageal reflux disease without esophagitis -continue homePPI  MechanicalFall -PT/OT  Closed rib fractures(due to fall 11/11/19) --prnoxycodone, fentanyl, Robaxin --Incentive spirometry   Incidental findings of CT scan:CT scan showed pancreatic head calcifications may be indicative of prior pancreatitis. New areas of low attenuation in the pancreas have developed since previous imaging studies. 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 -this must be f/u with PCP -will check  lipase    DVT prophylaxis:heparin Code Status: Full Code Family Communication:none at  bedside  Disposition Plan:Expect DC home with home health PT and OT tomorrow, in 1-2 days, pending improvement in renal function.  Respiratory status is stable.   Coming From- home, independent, has family assistance Exp DC Date2/16-17 Barriers- clinical improvement, [facility] services Medically Stable for Discharge?No   Objective: Vitals:   11/23/19 1316 11/23/19 1504 11/23/19 1930 11/23/19 2002  BP: 120/61 128/73 134/72   Pulse: (!) 118 (!) 110 (!) 102   Resp:  20 18   Temp:  98 F (36.7 C) 98.4 F (36.9 C)   TempSrc:  Oral Oral   SpO2:  98% 100% 100%  Weight:      Height:        Intake/Output Summary (Last 24 hours) at 11/23/2019 2047 Last data filed at 11/23/2019 1348 Gross per 24 hour  Intake 120 ml  Output 100 ml  Net 20 ml   Filed Weights   11/18/19 0929 11/18/19 1632  Weight: 97.5 kg 99.5 kg    Examination:  General exam: awake, alert, no acute distress Respiratory system: decreased at bases but overall aeration improved, no wheezes, rales or rhonchi, normal respiratory effort at rest. Cardiovascular system: normal [id number], RRR, 3/6 sysolic murmur, no pedal edema.   Central nervous system: A&O x4. no gross focal neurologic deficits, normal speech Extremities: moves all, no edema, normal tone Skin: dry, intact, normal temperature, normal color Psychiatry: normal mood, congruent affect, judgement and insight appear normal    Data Reviewed: I have personally reviewed following labs and imaging studies  CBC: Recent Labs  Lab 11/18/19 1005 11/18/19 1005 11/19/19 0451 11/20/19 0652 11/21/19 0459 11/22/19 0454 11/23/19 0445  WBC 13.4*   < > 13.5* 17.1* 16.4* 19.3* 18.1*  NEUTROABS 9.9*  --   --  15.2* 15.2*  --   --   HGB 11.3*   < > 10.0* 10.3* 10.2* 9.9* 9.6*  HCT 36.9   < > 32.2* 32.6* 31.7* 31.8* 30.4*  MCV 92.5   < > 91.7 89.3 89.3 90.6 89.4  PLT 357    < > 318 318 332 341 332   < > = values in this interval not displayed.   Basic Metabolic Panel: Recent Labs  Lab 11/19/19 0451 11/19/19 0451 11/20/19 [id number] 11/21/19 0459 11/22/19 0454 11/22/19 1000 11/23/19 0445  NA 137  --  136 135 138  --  135  K 5.1   < > 4.5 5.1 5.7* 5.5* 4.8  CL 107  --  107 104 105  --  105  CO2 23  --  20* 22 23  --  22  GLUCOSE 217*  --  217* 289* 261*  --  184*  BUN 47*  --  47* 54* 62*  --  66*  CREATININE 1.55*  --  1.24* 1.42* 1.72*  --  1.73*  CALCIUM 8.2*  --  8.3* 8.4* 8.6*  --  8.3*  MG  --   --  1.9 2.5* 2.6*  --  2.5*   < > = values in this interval not displayed.   GFR: Estimated Creatinine Clearance: 34.6 mL/min (A) (by C-G formula based on SCr of 1.73 mg/dL (H)). Liver Function Tests: Recent Labs  Lab 11/18/19 1005  AST 22  ALT 16  ALKPHOS 73  BILITOT 0.6  PROT 6.8  ALBUMIN 3.5   Recent Labs  Lab 11/18/19 1005  LIPASE 22   No results for input(s): AMMONIA in the last 168 hours. Coagulation Profile: No results for input(s): INR, PROTIME in  the last 168 hours. Cardiac Enzymes: No results for input(s): CKTOTAL, CKMB, CKMBINDEX, TROPONINI in the last 168 hours. BNP (last 3 results) No results for input(s): PROBNP in the last 8760 hours. HbA1C: No results for input(s): HGBA1C in the last 72 hours. CBG: Recent Labs  Lab 11/22/19 2301 11/23/19 0758 11/23/19 1110 11/23/19 1613 11/23/19 2040  GLUCAP 278* 145* 191* 208* 190*   Lipid Profile: No results for input(s): CHOL, HDL, LDLCALC, TRIG, CHOLHDL, LDLDIRECT in the last 72 hours. Thyroid Function Tests: No results for input(s): TSH, T4TOTAL, FREET4, T3FREE, THYROIDAB in the last 72 hours. Anemia Panel: Recent Labs    11/22/19 1852  FERRITIN 68  TIBC 379  IRON 28   Sepsis Labs: No results for input(s): PROCALCITON, LATICACIDVEN in the last 168 hours.  Recent Results (from the past 240 hour(s))  Respiratory Panel by RT PCR (Flu A&B, Covid) - Nasopharyngeal Swab      Status: None   Collection Time: 11/18/19  2:07 PM   Specimen: Nasopharyngeal Swab  Result Value Ref Range Status   SARS Coronavirus 2 by RT PCR NEGATIVE NEGATIVE Final    Comment: (NOTE) SARS-CoV-2 target nucleic acids are NOT DETECTED. The SARS-CoV-2 RNA is generally detectable in upper respiratoy specimens during the acute phase of infection. The lowest concentration of SARS-CoV-2 viral copies this assay can detect is 131 copies/mL. A negative result does not preclude SARS-Cov-2 infection and should not be used as the sole basis for treatment or other patient management decisions. A negative result may occur with  improper specimen collection/handling, submission of specimen other than nasopharyngeal swab, presence of viral mutation(s) within the areas targeted by this assay, and inadequate number of viral copies (<131 copies/mL). A negative result must be combined with clinical observations, patient history, and epidemiological information. The expected result is Negative. Fact Sheet for Patients:  PinkCheek.be Fact Sheet for Healthcare Providers:  GravelBags.it This test is not yet ap proved or cleared by the [place] FDA and  has been authorized for detection and/or diagnosis of SARS-CoV-2 by FDA under an Emergency Use Authorization (EUA). This EUA will remain  in effect (meaning this test can be used) for the duration of the COVID-19 declaration under Section 564(b)(1) of the Act, 21 U.S.C. section 360bbb-3(b)(1), unless the authorization is terminated or revoked sooner.    Influenza A by PCR NEGATIVE NEGATIVE Final   Influenza B by PCR NEGATIVE NEGATIVE Final    Comment: (NOTE) The Xpert Xpress SARS-CoV-2/FLU/RSV assay is intended as an aid in  the diagnosis of influenza from Nasopharyngeal swab specimens and  should not be used as a sole basis for treatment. Nasal washings and  aspirates are unacceptable for  Xpert Xpress SARS-CoV-2/FLU/RSV  testing. Fact Sheet for Patients: PinkCheek.be Fact Sheet for Healthcare Providers: GravelBags.it This test is not yet approved or cleared by the [place] FDA and  has been authorized for detection and/or diagnosis of SARS-CoV-2 by  FDA under an Emergency Use Authorization (EUA). This EUA will remain  in effect (meaning this test can be used) for the duration of the  Covid-19 declaration under Section 564(b)(1) of the Act, 21  U.S.C. section 360bbb-3(b)(1), unless the authorization is  terminated or revoked. Performed at [facility], [address], [place], [place] [address]          Radiology Studies: No results found.      Scheduled Meds:  allopurinol  100 mg Oral Daily   dextromethorphan-guaiFENesin  1 tablet Oral BID  diclofenac Sodium  2 g Topical QID   DULoxetine  30 mg Oral Daily   ferrous sulfate  325 mg Oral BID WC   heparin injection (subcutaneous)  5,000 Units Subcutaneous Q8H   insulin aspart  0-5 Units Subcutaneous QHS   insulin aspart  0-9 Units Subcutaneous TID WC   insulin aspart  7 Units Subcutaneous TID WC   insulin detemir  8 Units Subcutaneous BID   ipratropium-albuterol  3 mL Nebulization TID   mometasone-formoterol  2 puff Inhalation BID   pantoprazole  40 mg Oral Daily   polyethylene glycol  17 g Oral Daily   [START ON 11/24/2019] predniSONE  40 mg Oral Q breakfast   simvastatin  20 mg Oral Daily   sodium chloride flush  10 mL Intravenous Q12H   sodium zirconium cyclosilicate  5 g Oral TID   Continuous Infusions:  sodium chloride 75 mL/hr at 11/23/19 0835     LOS: 4 days    Time spent: 30 minutes    Ezekiel Slocumb, DO [facility]   If 7PM-7AM, please contact night-coverage [email or web address] 11/23/2019, 8:47 PM

## 2019-11-23 NOTE — Care Management Important Message (Signed)
Important Message  Patient Details  Name: [name] [name] MRN: [id number] Date of Birth: 1951/01/09   Medicare Important Message Given:  Yes     [name] 11/23/2019, 11:46 AM

## 2019-11-23 NOTE — Progress Notes (Signed)
[facility]  ROUNDING NOTE   Subjective:  Renal function about the same today. BUN still high at 66 with a creatinine of 1.7. Patient also appears to have iron deficiency.   Objective:  Vital signs in last 24 hours:  Temp:  [97.9 F (36.6 C)-98.3 F (36.8 C)] 98 F (36.7 C) (02/15 1504) Pulse Rate:  [103-118] 110 (02/15 1504) Resp:  [19-20] 20 (02/15 1504) BP: (116-128)/(61-83) 128/73 (02/15 1504) SpO2:  [97 %-100 %] 98 % (02/15 1504)  Weight change:  Filed Weights   11/18/19 0929 11/18/19 1632  Weight: 97.5 kg 99.5 kg    Intake/Output: I/O last 3 completed shifts: In: 710.4 [P.O.:650; I.V.:60.4] Out: 0    Intake/Output this shift:  Total I/O In: 120 [P.O.:120] Out: 100 [Urine:100]  Physical Exam: General: No acute distress  Head: Normocephalic, atraumatic. Moist oral mucosal membranes  Eyes: Anicteric  Neck: Supple, trachea midline  Lungs:  Clear to auscultation, normal effort  Heart: S1S2 no rubs  Abdomen:  Soft, nontender, bowel sounds present  Extremities: No peripheral edema.  Neurologic: Awake, alert, following commands  Skin: No lesions  Access:     Basic Metabolic Panel: Recent Labs  Lab 11/19/19 0451 11/19/19 0451 11/20/19 [id number] 11/20/19 [id number] 11/21/19 0459 11/22/19 0454 11/22/19 1000 11/23/19 0445  NA 137  --  136  --  135 138  --  135  K 5.1   < > 4.5  --  5.1 5.7* 5.5* 4.8  CL 107  --  107  --  104 105  --  105  CO2 23  --  20*  --  22 23  --  22  GLUCOSE 217*  --  217*  --  289* 261*  --  184*  BUN 47*  --  47*  --  54* 62*  --  66*  CREATININE 1.55*  --  1.24*  --  1.42* 1.72*  --  1.73*  CALCIUM 8.2*   < > 8.3*   < > 8.4* 8.6*  --  8.3*  MG  --   --  1.9  --  2.5* 2.6*  --  2.5*   < > = values in this interval not displayed.    Liver Function Tests: Recent Labs  Lab 11/18/19 1005  AST 22  ALT 16  ALKPHOS 73  BILITOT 0.6  PROT 6.8  ALBUMIN 3.5   Recent Labs  Lab 11/18/19 1005  LIPASE 22   No results for  input(s): AMMONIA in the last 168 hours.  CBC: Recent Labs  Lab 11/18/19 1005 11/18/19 1005 11/19/19 0451 11/20/19 [id number] 11/21/19 0459 11/22/19 0454 11/23/19 0445  WBC 13.4*   < > 13.5* 17.1* 16.4* 19.3* 18.1*  NEUTROABS 9.9*  --   --  15.2* 15.2*  --   --   HGB 11.3*   < > 10.0* 10.3* 10.2* 9.9* 9.6*  HCT 36.9   < > 32.2* 32.6* 31.7* 31.8* 30.4*  MCV 92.5   < > 91.7 89.3 89.3 90.6 89.4  PLT 357   < > 318 318 332 341 332   < > = values in this interval not displayed.    Cardiac Enzymes: No results for input(s): CKTOTAL, CKMB, CKMBINDEX, TROPONINI in the last 168 hours.  BNP: Invalid input(s): POCBNP  CBG: Recent Labs  Lab 11/22/19 2049 11/22/19 2301 11/23/19 0758 11/23/19 1110 11/23/19 1613  GLUCAP 311* 278* 145* 191* 208*    Microbiology: Results for orders placed or performed during the  hospital encounter of 11/18/19  Respiratory Panel by RT PCR (Flu A&B, Covid) - Nasopharyngeal Swab     Status: None   Collection Time: 11/18/19  2:07 PM   Specimen: Nasopharyngeal Swab  Result Value Ref Range Status   SARS Coronavirus 2 by RT PCR NEGATIVE NEGATIVE Final    Comment: (NOTE) SARS-CoV-2 target nucleic acids are NOT DETECTED. The SARS-CoV-2 RNA is generally detectable in upper respiratoy specimens during the acute phase of infection. The lowest concentration of SARS-CoV-2 viral copies this assay can detect is 131 copies/mL. A negative result does not preclude SARS-Cov-2 infection and should not be used as the sole basis for treatment or other patient management decisions. A negative result may occur with  improper specimen collection/handling, submission of specimen other than nasopharyngeal swab, presence of viral mutation(s) within the areas targeted by this assay, and inadequate number of viral copies (<131 copies/mL). A negative result must be combined with clinical observations, patient history, and epidemiological information. The expected result is  Negative. Fact Sheet for Patients:  PinkCheek.be Fact Sheet for Healthcare Providers:  GravelBags.it This test is not yet ap proved or cleared by the [place] FDA and  has been authorized for detection and/or diagnosis of SARS-CoV-2 by FDA under an Emergency Use Authorization (EUA). This EUA will remain  in effect (meaning this test can be used) for the duration of the COVID-19 declaration under Section 564(b)(1) of the Act, 21 U.S.C. section 360bbb-3(b)(1), unless the authorization is terminated or revoked sooner.    Influenza A by PCR NEGATIVE NEGATIVE Final   Influenza B by PCR NEGATIVE NEGATIVE Final    Comment: (NOTE) The Xpert Xpress SARS-CoV-2/FLU/RSV assay is intended as an aid in  the diagnosis of influenza from Nasopharyngeal swab specimens and  should not be used as a sole basis for treatment. Nasal washings and  aspirates are unacceptable for Xpert Xpress SARS-CoV-2/FLU/RSV  testing. Fact Sheet for Patients: PinkCheek.be Fact Sheet for Healthcare Providers: GravelBags.it This test is not yet approved or cleared by the [place] FDA and  has been authorized for detection and/or diagnosis of SARS-CoV-2 by  FDA under an Emergency Use Authorization (EUA). This EUA will remain  in effect (meaning this test can be used) for the duration of the  Covid-19 declaration under Section 564(b)(1) of the Act, 21  U.S.C. section 360bbb-3(b)(1), unless the authorization is  terminated or revoked. Performed at [facility], [address] [place], [place] [address]     Coagulation Studies: No results for input(s): LABPROT, INR in the last 72 hours.  Urinalysis: No results for input(s): COLORURINE, LABSPEC, PHURINE, GLUCOSEU, HGBUR, BILIRUBINUR, KETONESUR, PROTEINUR, UROBILINOGEN, NITRITE, LEUKOCYTESUR in the last 72 hours.  Invalid input(s):  APPERANCEUR    Imaging: No results found.   Medications:   . sodium chloride 75 mL/hr at 11/23/19 0835   . allopurinol  100 mg Oral Daily  . dextromethorphan-guaiFENesin  1 tablet Oral BID  . diclofenac Sodium  2 g Topical QID  . DULoxetine  30 mg Oral Daily  . heparin injection (subcutaneous)  5,000 Units Subcutaneous Q8H  . insulin aspart  0-5 Units Subcutaneous QHS  . insulin aspart  0-9 Units Subcutaneous TID WC  . insulin aspart  7 Units Subcutaneous TID WC  . insulin detemir  8 Units Subcutaneous BID  . ipratropium-albuterol  3 mL Nebulization TID  . mometasone-formoterol  2 puff Inhalation BID  . pantoprazole  40 mg Oral Daily  . polyethylene glycol  17 g  Oral Daily  . [START ON 11/24/2019] predniSONE  40 mg Oral Q breakfast  . simvastatin  20 mg Oral Daily  . sodium chloride flush  10 mL Intravenous Q12H  . sodium zirconium cyclosilicate  5 g Oral TID   albuterol, bisacodyl, fentaNYL (SUBLIMAZE) injection, hydrALAZINE, methocarbamol, metoprolol tartrate, morphine injection, ondansetron (ZOFRAN) IV, oxyCODONE  Assessment/ Plan:  69 y.o. female with a PMHx of COPD, diabetes mellitus type 2, GERD, hyperlipidemia, hypertension, peripheral neuropathy, osteoporosis, pulmonary fibrosis, sleep apnea, chronic kidney disease stage III yea baseline creatinine 1.4, who was admitted to [facility] on 11/18/2019 for evaluation of shortness of breath.  1.  Acute kidney injury/chronic kidney disease stage IIIA.  Baseline creatinine 1.4.  The patient's creatinine remains above her baseline.  Creatinine currently 1.7.  Continue IV fluid hydration for now.  Avoid nephrotoxins as possible.  Awaiting further serologic work-up.  2.  Hyperkalemia.  Serum potassium now down to 4.8.  Continue the patient on sodium zirconium 5 g p.o. 3 times daily.  3.  Anemia of chronic kidney disease/iron deficiency anemia.  Hemoglobin currently 9.6.  Iron saturation low at 7.  Start the patient on ferrous sulfate 325  mg twice daily.   LOS: 4 Shalea Tomczak 2/15/20214:57 PM

## 2019-11-23 NOTE — Progress Notes (Signed)
OT Cancellation Note  Patient Details Name: [name] [name] MRN: [id number] DOB: 1950/12/25   Cancelled Treatment:    Reason Eval/Treat Not Completed: Patient declined, no reason specified. Upon entry into pt's room, pt alert and oriented. Requested OT come back another time as she was too fatigued to participate. Will re-attempt as schedule permits.   Jeni Salles, MPH, MS, OTR/L ascom [phone] 11/23/19, 1:51 PM

## 2019-11-24 LAB — BASIC METABOLIC PANEL
Anion gap: 7 (ref 5–15)
BUN: 55 mg/dL — ABNORMAL HIGH (ref 8–23)
CO2: 23 mmol/L (ref 22–32)
Calcium: 8 mg/dL — ABNORMAL LOW (ref 8.9–10.3)
Chloride: 109 mmol/L (ref 98–111)
Creatinine, Ser: 1.47 mg/dL — ABNORMAL HIGH (ref 0.44–1.00)
GFR calc Af Amer: 43 mL/min — ABNORMAL LOW (ref >60)
GFR calc non Af Amer: 37 mL/min — ABNORMAL LOW (ref >60)
Glucose, Bld: 137 mg/dL — ABNORMAL HIGH (ref 70–99)
Potassium: 4.2 mmol/L (ref 3.5–5.1)
Sodium: 139 mmol/L (ref 135–145)

## 2019-11-24 LAB — PROTEIN ELECTRO, RANDOM URINE
Albumin ELP, Urine: 22.1 %
Alpha-1-Globulin, U: 4.7 %
Alpha-2-Globulin, U: 18 %
Beta Globulin, U: 24.5 %
Gamma Globulin, U: 30.7 %
Total Protein, Urine: 14.5 mg/dL

## 2019-11-24 LAB — PROTEIN ELECTROPHORESIS, SERUM
A/G Ratio: 1.1 (ref 0.7–1.7)
Albumin ELP: 3.5 g/dL (ref 2.9–4.4)
Alpha-1-Globulin: 0.3 g/dL (ref 0.0–0.4)
Alpha-2-Globulin: 1 g/dL (ref 0.4–1.0)
Beta Globulin: 1.2 g/dL (ref 0.7–1.3)
Gamma Globulin: 0.6 g/dL (ref 0.4–1.8)
Globulin, Total: 3.1 g/dL (ref 2.2–3.9)
Total Protein ELP: 6.6 g/dL (ref 6.0–8.5)

## 2019-11-24 LAB — CBC
HCT: 28.4 % — ABNORMAL LOW (ref 36.0–46.0)
Hemoglobin: 9.2 g/dL — ABNORMAL LOW (ref 12.0–15.0)
MCH: 28.7 pg (ref 26.0–34.0)
MCHC: 32.4 g/dL (ref 30.0–36.0)
MCV: 88.5 fL (ref 80.0–100.0)
Platelets: 329 10*3/uL (ref 150–400)
RBC: 3.21 MIL/uL — ABNORMAL LOW (ref 3.87–5.11)
RDW: 16 % — ABNORMAL HIGH (ref 11.5–15.5)
WBC: 16.9 10*3/uL — ABNORMAL HIGH (ref 4.0–10.5)
nRBC: 0.1 % (ref 0.0–0.2)

## 2019-11-24 LAB — GLUCOSE, CAPILLARY
Glucose-Capillary: 94 mg/dL (ref 70–99)
Glucose-Capillary: 305 mg/dL — ABNORMAL HIGH (ref 70–99)

## 2019-11-24 LAB — ANA W/REFLEX IF POSITIVE: Anti Nuclear Antibody (ANA): NEGATIVE

## 2019-11-24 MED ORDER — DICLOFENAC SODIUM 1 % EX GEL: 2.0000 g | Freq: Four times a day (QID) | CUTANEOUS | 0 refills | Status: DC | PRN

## 2019-11-24 MED ORDER — FERROUS SULFATE 325 (65 FE) MG PO TABS: 325.0000 mg | ORAL_TABLET | Freq: Two times a day (BID) | ORAL | 1 refills | Status: DC

## 2019-11-24 MED ORDER — ALBUTEROL SULFATE HFA 108 (90 BASE) MCG/ACT IN AERS: 2.0000 | INHALATION_SPRAY | Freq: Four times a day (QID) | RESPIRATORY_TRACT | 1 refills | Status: DC | PRN

## 2019-11-24 NOTE — TOC Initial Note (Signed)
Transition of Care [facility]) - Initial/Assessment Note    Patient Details  Name: [name] [name] MRN: [id number] Date of Birth: [birth year]  Transition [facility]) CM/SW Contact:    Eileen Stanford, LCSW Phone Number: 11/24/2019, 10:11 AM  Clinical Narrative:     Pt is alert and oriented. CSW spoke with pt at bedside. Pt is agreeable to [facility] Pt lives home alone however has a lot of support. Pt states her son, daughter, and granddaughter assist in her care. Pt is agreeable to use [facility]. CSW explained the equipment recommendation was a tub bench, however this would not be covered by insurance. Pt does not want the equipment if insurance does not cover it. Pt states she has a walker at home. Pt wants CSW to check and see if 3n1 would be covered. CSW will inquire.   Readmission risk screening completed. Pt has significant family support. Pt is able to afford all medications. Pt denies any further concerns at this time.            Expected Discharge Plan: [place] Barriers to Discharge: Continued Medical Work up   Patient Goals and CMS Choice Patient states their goals for this hospitalization and ongoing recovery are:: to go home and get stronger   Choice offered to / list presented to : Patient  Expected Discharge Plan and Services Expected Discharge Plan: [place] In-house Referral: Clinical Social Work   Post Acute Care Choice: [place] arrangements for the past 2 months: [place]: PT, OT [place] Agency: [place] ([place]) Date HH Agency Contacted: 11/24/19 Time [place]: 1008 Representative spoke with at [place]: Corene Cornea  Prior Living Arrangements/Services Living arrangements for the past 2 months: [place] with:: Self Patient language and need for interpreter reviewed:: Yes Do you feel safe going back to the place where you live?: Yes       Need for Family Participation in Patient Care: Yes (Comment) Care giver support system in place?: Yes (comment)   Criminal Activity/Legal Involvement Pertinent to Current Situation/Hospitalization: No - Comment as needed  Activities of Daily Living Home Assistive Devices/Equipment: Cane (specify quad or straight), Dentures (specify type), Hand-held shower hose, Grab bars in shower, Raised toilet seat with rails, Walker (specify type)(four wheeled walker) ADL Screening (condition at time of admission) Patient's cognitive ability adequate to safely complete daily activities?: Yes Is the patient deaf or have difficulty hearing?: No Does the patient have difficulty seeing, even when wearing glasses/contacts?: No Does the patient have difficulty concentrating, remembering, or making decisions?: No Patient able to express need for assistance with ADLs?: Yes Does the patient have difficulty dressing or bathing?: Yes Independently performs ADLs?: Yes (appropriate for developmental age) Does the patient have difficulty walking or climbing stairs?: Yes Weakness of Legs: Both Weakness of Arms/Hands: Both  Permission Sought/Granted Permission sought to share information with : Family Supports Permission granted to share information with : Yes, Verbal Permission Granted  Share Information with NAME: Tiffany  Permission granted to share info w AGENCY: [place] granted to share info w Relationship: Daughter     Emotional Assessment Appearance:: Appears stated age Attitude/Demeanor/Rapport: Engaged Affect (typically observed): Accepting, Appropriate, Calm Orientation: : Oriented to Situation, Oriented to  Time, Oriented  to Place, Oriented to Self Alcohol / Substance Use: Not Applicable Psych Involvement: No (comment)  Admission diagnosis:  SOB (shortness of breath) [R06.02] AKI (acute kidney injury) ([place]) [N17.9] Asthma exacerbation [J45.901] Closed fracture of multiple  ribs of both sides, initial encounter [S22.43XA] Patient Active Problem List   Diagnosis Date Noted  . Fall 11/18/2019  . Closed rib fracture 11/18/2019  . Pulmonary fibrosis ([place])   . Asthma exacerbation   . Acute renal failure superimposed on stage 3a chronic kidney disease ([place])   . Chronic pain syndrome 09/15/2018  . AVD (aortic valve disease) 10/07/2017  . Benign essential hypertension 06/17/2017  . Nonrheumatic aortic valve insufficiency 06/17/2017  . CKD (chronic kidney disease) stage 3, GFR 30-59 ml/min 05/20/2017  . Wound dehiscence 11/05/2016  . Lumbar stenosis 10/15/2016  . Renal impairment 10/05/2016  . Right lumbar radiculopathy 10/05/2016  . Complete tear of right rotator cuff 11/28/2015  . Rotator cuff arthropathy, right 11/28/2015  . Low vitamin D level 02/28/2015  . Gastroesophageal reflux disease without esophagitis 12/14/2014  . Morbid obesity with BMI of 40.0-44.9, adult ([place]) 10/06/2014  . Aortic stenosis 09/14/2014  . DOE (dyspnea on exertion) 09/08/2014  . History of adenomatous polyp of colon 09/08/2014  . DM2 (diabetes mellitus, type 2) ([place]) 03/22/2014  . Hyperlipidemia 03/22/2014   PCP:  Tracie Harrier, MD Pharmacy:   [id number] - [place], [place] - [address] [place] [address] Phone: [phone] Fax: [phone]     Social Determinants of Health (SDOH) Interventions    Readmission Risk Interventions Readmission Risk Prevention Plan 11/24/2019  Transportation Screening Complete  PCP or Specialist Appt within 3-5 Days Complete  HRI or [place] Complete  Social Work Consult for [place] Planning/Counseling Complete  Palliative Care Screening Not Applicable  Medication Review Press photographer) Complete  Some recent data might be hidden

## 2019-11-24 NOTE — Discharge Summary (Signed)
Physician Discharge Summary  [name] [name] [id number] DOB: April 28, 1951 DOA: 11/18/2019  PCP: [name] Harrier, MD  Admit date: 11/18/2019 Discharge date: 11/24/2019  Admitted From: Home Disposition:  Home  Recommendations for Outpatient Follow-up:  1. Follow up with PCP in 1-2 weeks 2. Please obtain BMP/CBC in one week 3. Follow up incidental CT pancreas finding, as below  Home Health: Yes Equipment/Devices: none   Discharge Condition: stable  CODE STATUS: full  Diet recommendation: Heart healthy  Brief/Interim Summary:  LOS - 4   Brief Narrative:  [name] Longenecker Smithis a 69 y.o.femalewith medical history significant ofhypertension, hyperlipidemia, diabetes mellitus, asthma, TIA, GERD, gout, depression, OSA, pulmonary fibrosis, anemia, chronic pain, aortic valve stenosis, CKD stage IIIa, who presented to the ED on 2/10with worseningshortness breath, dry cough and wheezing.Of note, patient has bilateral lower rib fractures after a mechanical fall at home on 2/3 and continues with significant pain on inspirations. In the ED, vitals stable. Labs showed leukocytosis. Covid-19 test negative. CXR clear of infiltrates. Patient admitted to hospitalist service with acute asthma exacerbation.  Acute Asthma Exacerbation in setting of underlying Pulmonary Fibrosis It appears the patient's rib cage pain from fractures after her fall are limiting her inspirations and at least partially contributing to her asthma exacerbation. Improving with treatment and improvement in the rib pain.  Breathing a little deeper than she was. --Transition to oralprednisone & taper --scheduled Duonebs q6h --PRN albuterol nebs q3h --Mucinex BID --cont Dulera (sub'd for home Symbicort) --supplemental oxygen if needed to keep sat >90% --incentive spirometer 10x hourly  --flutter valve  Hyperkalemia- resolved.  This is likely due to worsening renal function and ARB. --Hold losartan --continue  lokelma --Nephrology following  Acute renal failure superimposed on stage 3a chronic kidney disease ([place]): Baseline Cre is1.02-1.5.Creatinine 2.20on admission,likely due to dehydration and continuation of ARB.AKI had nearly resolved with IV hydration, but renal function has worsened again over the past couple of days, with associated hyperkalemia this morning.  Cr 1.70 past 2 days.  Improved again with hydration. --Nephrology consulted, appreciate recs --f/u pending lab eval --follow BMP daily --Avoid renal toxicagents and hypotension  Sinus tachycardia-secondary to above and pain with rib fractures.  Stable, probably her baseline, tachy on exertion. --Monitor on telemetry --No need to treat for rate control unless A. Fib/flutter,or sustaining over 120 at rest (>10 min)  Leukocytosis - likely due to steroids. No fever/chills or other signs of infection at this time. --monitor CBC and clinical status  Type 2 Diabetes with hyperglycemia - due to steroids. A1C 8.4% on admission. --increase Levemir to 8 units BID --Increase mealtimeNovolog to 7units TID WC --Continuesliding scale Novolog --patient will be off steroids very soon, hyperglycemia expected to resolve/improve  Hypertension- chronic, stable --resumelosartan  --Hydralazine prn  Gastroesophageal reflux disease without esophagitis -continue homePPI  MechanicalFall -PT/OT  Closed rib fractures(due to fall 11/11/19) --prnoxycodone, fentanyl, Robaxin --Incentive spirometry   Incidental findings of CT scan:CT scan showed pancreatic head calcifications may be indicative of prior pancreatitis. New areas of low attenuation in the pancreas have developed since previous imaging studies. 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 -this must be f/u with PCP   Discharge Diagnoses: Principal Problem:   Asthma exacerbation Active  Problems:   Benign essential hypertension   Gastroesophageal reflux disease without esophagitis   Fall   Closed rib fracture   Pulmonary fibrosis ([place])   Acute renal failure superimposed on stage 3a chronic kidney disease (  [facility])    Discharge Instructions   Discharge Instructions    Call MD for:   Complete by: As directed    Worsening shortness of breath or wheezing.   Call MD for:  temperature >100.4   Complete by: As directed    Diet - low sodium heart healthy   Complete by: As directed    Increase activity slowly   Complete by: As directed      Allergies as of 11/24/2019      Reactions   Acetaminophen-codeine Other (See Comments)   Other reaction(s): Unknown   Codeine Nausea And Vomiting   Furosemide Other (See Comments)   Penicillin V Potassium    Other reaction(s): Unknown   Quinine    Other reaction(s): Unknown   Tramadol    Other reaction(s): Unknown   Tylenol [acetaminophen]    Tylenol-Codeine: Dizziness      Medication List    TAKE these medications   albuterol 108 (90 Base) MCG/ACT inhaler Commonly known as: VENTOLIN HFA Inhale 2 puffs into the lungs every 6 (six) hours as needed for wheezing or shortness of breath.   allopurinol 100 MG tablet Commonly known as: ZYLOPRIM Take 100 mg by mouth daily.   budesonide-formoterol 160-4.5 MCG/ACT inhaler Commonly known as: SYMBICORT Inhale 2 puffs into the lungs 2 (two) times daily.   cyclobenzaprine 5 MG tablet Commonly known as: FLEXERIL Take 5 mg by mouth 2 (two) times daily as needed for muscle spasms.   diclofenac Sodium 1 % Gel Commonly known as: VOLTAREN Apply 2 g topically 4 (four) times daily as needed (rib cage pain).   DULoxetine 30 MG capsule Commonly known as: CYMBALTA Take 30 mg by mouth daily.   esomeprazole 40 MG capsule Commonly known as: NEXIUM Take 40 mg by mouth daily.   ferrous sulfate 325 (65 FE) MG tablet Take 1 tablet (325 mg total) by mouth 2 (two) times daily with a  meal.   glimepiride 2 MG tablet Commonly known as: AMARYL Take 2 mg by mouth every morning.   losartan 50 MG tablet Commonly known as: COZAAR Take 50 mg by mouth daily.   metFORMIN 500 MG tablet Commonly known as: GLUCOPHAGE Take 500 mg by mouth 2 (two) times daily with a meal.   oxyCODONE 5 MG immediate release tablet Commonly known as: Roxicodone Take 1 tablet (5 mg total) by mouth every 4 (four) hours as needed for severe pain. What changed: when to take this   predniSONE 5 MG tablet Commonly known as: DELTASONE Take 5 mg by mouth daily.   simvastatin 20 MG tablet Commonly known as: ZOCOR Take 20 mg by mouth daily.            Durable Medical Equipment  (From admission, onward)         Start     Ordered   11/19/19 1306  For home use only DME Tub bench  Once     11/19/19 1305         Follow-up Information    [facility] Follow [facility].   Specialty: Home Health Services Why: Physical Therapy, Occupational Therapy         Allergies  Allergen Reactions  . Acetaminophen-Codeine Other (See Comments)    Other reaction(s): Unknown   . Codeine Nausea And Vomiting  . Furosemide Other (See Comments)  . Penicillin V Potassium     Other reaction(s): Unknown  . Quinine     Other reaction(s): Unknown  . Tramadol  Other reaction(s): Unknown  . Tylenol [Acetaminophen]     Tylenol-Codeine: Dizziness    Consultations:  Nephrology   Procedures/Studies: CT ABDOMEN PELVIS WO CONTRAST  Result Date: 11/18/2019 CLINICAL DATA:  Abdominal trauma, persistent chest and epigastric pain status post ground level fall. Fall occurred on 11/11/2019, evaluated on that date, presents with persistent pain. EXAM: CT CHEST, ABDOMEN AND PELVIS WITHOUT CONTRAST TECHNIQUE: Multidetector CT imaging of the chest, abdomen and pelvis was performed following the standard protocol without IV contrast. COMPARISON:  Chest CT of 06/16/2018 FINDINGS: CT CHEST FINDINGS  Cardiovascular: Calcified atherosclerotic changes throughout the thoracic aorta. Aorta is nonaneurysmal. Limited assessment of vascular structures due to lack of intravenous contrast. The mild engorgement of central pulmonary vasculature to 2.7 cm. Signs of coronary artery calcification of left and right coronary circulation. Heart size normal without pericardial effusion. Mediastinum/Nodes: No signs of mediastinal hematoma. No signs of thoracic inlet adenopathy. No signs of axillary lymphadenopathy. No mediastinal adenopathy. Esophagus grossly normal. Lungs/Pleura: Mild centrilobular emphysema at the lung apices. Bandlike opacity in the right upper lobe along the minor fissure, associated with adjacent rib fractures, see below. No signs of pneumothorax. Airways are patent. No signs of dense consolidation or evidence of pleural effusion. Scattered atelectatic changes at the lung bases. Musculoskeletal: Right fourth anterior rib fracture also with right fifth anterior rib fracture, sixth and seventh anterior rib and nondisplaced fracture of right eighth rib as well. Stranding along the right chest wall may represent small contusion or hematoma. This is deep to the pectoralis and associated with the intercostal musculature. Visualize scapula and clavicle on both right and left are unremarkable. Sternum is intact. Signs of previous rib fracture also demonstrated on the left but with suspected acute nondisplaced fracture of left fourth rib also with potential nondisplaced fractures of fifth, sixth, seventh and eighth ribs on the left. Spinal alignment is maintained with signs of degenerative change. Costochondral elements are intact. CT ABDOMEN PELVIS FINDINGS Hepatobiliary: Mildly limited assessment of upper abdominal contents due to respiratory motion. No signs of focal lesion or evidence of hepatic trauma to include perihepatic hematoma. Gallbladder is normal. No signs of biliary ductal dilation. Pancreas: Suggestion  of cystic lesions in the pancreas (image 27, series 2) 13 mm area along the anterior pancreas with low-attenuation. Low-density in the body of the pancreas with another area that may represent a cystic lesion measuring as much as 13 mm. This is at the junction of body and tail. Pancreatic contours of changed slightly since the prior study but again there is respiratory motion in this area. No significant peripancreatic stranding. Calcification, coarse calcification in the head of the pancreas has developed since 20/11. There may be an adjacent low-attenuation area a near this calcification. The uncinate process is small but unchanged since 20/11. Spleen: Spleen is normal size without focal lesion. Adrenals/Urinary Tract: Adrenal glands are normal. Kidneys with mild scarring bilaterally. No signs of hydronephrosis or nephrolithiasis. Stomach/Bowel: Normal appendix. No signs of acute bowel process. Extensive colonic diverticulosis. Vascular/Lymphatic: Signs of extensive atherosclerotic calcification of the abdominal aorta extending into branch vessels. No signs of adenopathy in the upper abdomen or in the retroperitoneum. No signs of pelvic lymphadenopathy. Reproductive: Post hysterectomy. No signs of adnexal mass. Other: Small infraumbilical hernia contains fat. Small umbilical hernia contains fat. Musculoskeletal: Extensive spinal degenerative changes. No acute bone process. L4 compression fracture is unchanged and associated with surrounding degenerative changes. Signs of laminectomy at L4-5 similar to prior studies. IMPRESSION: 1. Rib fractures of varying  ages bilaterally with acute/subacute fractures of left and right anterolateral ribs, associated with chest wall contusion or small hematoma along the right chest wall as described. 2. No signs of pneumothorax. 3. Contusion or atelectasis in the right mid chest in the upper lobe along the fissure with basilar atelectasis bilaterally. 4. No evidence of acute  traumatic injury involving the solid organs of the abdomen or pelvis. 5. Pancreatic head calcifications may be indicative of prior pancreatitis. New areas of low attenuation in the pancreas have developed since previous imaging studies. 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 Correlate with any history of pancreatitis and consider short interval follow-up after resolution of acute symptoms with pancreatic protocol CT or MRI as possible. 6. Extensive colonic diverticulosis. 7. Extensive atherosclerotic calcification of the thoracic aorta and branch vessels. 8. Mild engorgement of central pulmonary vasculature, raising the possibility of pulmonary arterial hypertension. A Aortic Atherosclerosis (ICD10-I70.0) and Emphysema (ICD10-J43.9). Electronically Signed   By: Zetta Bills M.D.   On: 11/18/2019 12:30   DG Chest 2 View  Result Date: 11/18/2019 CLINICAL DATA:  Shortness of breath EXAM: CHEST - 2 VIEW COMPARISON:  November 14, 2019 FINDINGS: Lungs are clear. Heart is upper normal in size with pulmonary vascularity normal. No adenopathy. There is aortic atherosclerosis. No bone lesions. IMPRESSION: Lungs clear. Heart upper normal in size. Aortic Atherosclerosis (ICD10-I70.0). Electronically Signed   By: Lowella Grip III M.D.   On: 11/18/2019 10:56   DG Chest 2 View  Result Date: 11/11/2019 CLINICAL DATA:  Fall today. Distal sternal pain. History diabetes and asthma. EXAM: CHEST - 2 VIEW COMPARISON:  Radiographs 10/17/2016. CT 06/16/2018. FINDINGS: The heart size and mediastinal contours are stable with aortic atherosclerosis. The lungs are clear. There is no pleural effusion or pneumothorax. The sternum is not well visualized, although there is no evidence of sternal fracture or retrosternal hematoma. Old fracture of the mid left humerus and narrowing of the subacromial space of both shoulders noted. IMPRESSION: No evidence of acute  chest injury. The sternum is not well visualized on these views. Electronically Signed   By: Richardean Sale M.D.   On: 11/11/2019 13:53   DG Ribs Unilateral W/Chest Right  Result Date: 11/14/2019 CLINICAL DATA:  Right rib pain after fall. EXAM: RIGHT RIBS AND CHEST - 3+ VIEW COMPARISON:  November 11, 2019. FINDINGS: Minimally displaced fracture is seen involving the lateral portion of the right tenth rib. There is no evidence of pneumothorax or pleural effusion. Both lungs are clear. Heart size and mediastinal contours are within normal limits. IMPRESSION: Minimally displaced right tenth rib fracture. No acute cardiopulmonary abnormality seen. Electronically Signed   By: Marijo Conception M.D.   On: 11/14/2019 10:50   CT Chest Wo Contrast  Result Date: 11/18/2019 CLINICAL DATA:  Abdominal trauma, persistent chest and epigastric pain status post ground level fall. Fall occurred on 11/11/2019, evaluated on that date, presents with persistent pain. EXAM: CT CHEST, ABDOMEN AND PELVIS WITHOUT CONTRAST TECHNIQUE: Multidetector CT imaging of the chest, abdomen and pelvis was performed following the standard protocol without IV contrast. COMPARISON:  Chest CT of 06/16/2018 FINDINGS: CT CHEST FINDINGS Cardiovascular: Calcified atherosclerotic changes throughout the thoracic aorta. Aorta is nonaneurysmal. Limited assessment of vascular structures due to lack of intravenous contrast. The mild engorgement of central pulmonary vasculature to 2.7 cm. Signs of coronary artery calcification of left and right coronary circulation. Heart size normal without pericardial effusion. Mediastinum/Nodes: No  signs of mediastinal hematoma. No signs of thoracic inlet adenopathy. No signs of axillary lymphadenopathy. No mediastinal adenopathy. Esophagus grossly normal. Lungs/Pleura: Mild centrilobular emphysema at the lung apices. Bandlike opacity in the right upper lobe along the minor fissure, associated with adjacent rib fractures, see  below. No signs of pneumothorax. Airways are patent. No signs of dense consolidation or evidence of pleural effusion. Scattered atelectatic changes at the lung bases. Musculoskeletal: Right fourth anterior rib fracture also with right fifth anterior rib fracture, sixth and seventh anterior rib and nondisplaced fracture of right eighth rib as well. Stranding along the right chest wall may represent small contusion or hematoma. This is deep to the pectoralis and associated with the intercostal musculature. Visualize scapula and clavicle on both right and left are unremarkable. Sternum is intact. Signs of previous rib fracture also demonstrated on the left but with suspected acute nondisplaced fracture of left fourth rib also with potential nondisplaced fractures of fifth, sixth, seventh and eighth ribs on the left. Spinal alignment is maintained with signs of degenerative change. Costochondral elements are intact. CT ABDOMEN PELVIS FINDINGS Hepatobiliary: Mildly limited assessment of upper abdominal contents due to respiratory motion. No signs of focal lesion or evidence of hepatic trauma to include perihepatic hematoma. Gallbladder is normal. No signs of biliary ductal dilation. Pancreas: Suggestion of cystic lesions in the pancreas (image 27, series 2) 13 mm area along the anterior pancreas with low-attenuation. Low-density in the body of the pancreas with another area that may represent a cystic lesion measuring as much as 13 mm. This is at the junction of body and tail. Pancreatic contours of changed slightly since the prior study but again there is respiratory motion in this area. No significant peripancreatic stranding. Calcification, coarse calcification in the head of the pancreas has developed since 20/11. There may be an adjacent low-attenuation area a near this calcification. The uncinate process is small but unchanged since 20/11. Spleen: Spleen is normal size without focal lesion. Adrenals/Urinary Tract:  Adrenal glands are normal. Kidneys with mild scarring bilaterally. No signs of hydronephrosis or nephrolithiasis. Stomach/Bowel: Normal appendix. No signs of acute bowel process. Extensive colonic diverticulosis. Vascular/Lymphatic: Signs of extensive atherosclerotic calcification of the abdominal aorta extending into branch vessels. No signs of adenopathy in the upper abdomen or in the retroperitoneum. No signs of pelvic lymphadenopathy. Reproductive: Post hysterectomy. No signs of adnexal mass. Other: Small infraumbilical hernia contains fat. Small umbilical hernia contains fat. Musculoskeletal: Extensive spinal degenerative changes. No acute bone process. L4 compression fracture is unchanged and associated with surrounding degenerative changes. Signs of laminectomy at L4-5 similar to prior studies. IMPRESSION: 1. Rib fractures of varying ages bilaterally with acute/subacute fractures of left and right anterolateral ribs, associated with chest wall contusion or small hematoma along the right chest wall as described. 2. No signs of pneumothorax. 3. Contusion or atelectasis in the right mid chest in the upper lobe along the fissure with basilar atelectasis bilaterally. 4. No evidence of acute traumatic injury involving the solid organs of the abdomen or pelvis. 5. Pancreatic head calcifications may be indicative of prior pancreatitis. New areas of low attenuation in the pancreas have developed since previous imaging studies. 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 Correlate with any history of pancreatitis and consider short interval follow-up after resolution of acute symptoms with pancreatic protocol CT or MRI as possible. 6. Extensive colonic diverticulosis. 7. Extensive atherosclerotic calcification of the thoracic aorta and branch  vessels. 8. Mild engorgement of central pulmonary vasculature, raising the possibility of pulmonary arterial  hypertension. A Aortic Atherosclerosis (ICD10-I70.0) and Emphysema (ICD10-J43.9). Electronically Signed   By: Zetta Bills M.D.   On: 11/18/2019 12:30         Subjective: Patient seen up in chair.  Reports feeling well.  Says her SOB feels at her baseline, she always gets SOB walking room to room.  Denies cough or wheezing, fever or chills, or other acute complaints.  She is agreeable to going home with [facility] today.   Discharge Exam: Vitals:   11/24/19 0230 11/24/19 0737  BP: 114/68 111/72  Pulse: 98 98  Resp: 20 16  Temp: 98.3 F (36.8 C) 97.7 F (36.5 C)  SpO2: 100% 97%   Vitals:   11/23/19 2002 11/24/19 0230 11/24/19 0455 11/24/19 0737  BP:  114/68  111/72  Pulse:  98  98  Resp:  20  16  Temp:  98.3 F (36.8 C)  97.7 F (36.5 C)  TempSrc:  Oral  Oral  SpO2: 100% 100%  97%  Weight:   108 kg   Height:        General: Pt is alert, awake, not in acute distress Cardiovascular: RRR, S1/S2 +, no rubs, no gallops Respiratory: CTA bilaterally decreased at bases, no wheezing, no rhonchi Abdominal: Soft, NT, ND, bowel sounds + Extremities: no edema, no cyanosis    The results of significant diagnostics from this hospitalization (including imaging, microbiology, ancillary and laboratory) are listed below for reference.     Microbiology: Recent Results (from the past 240 hour(s))  Respiratory Panel by RT PCR (Flu A&B, Covid) - Nasopharyngeal Swab     Status: None   Collection Time: 11/18/19  2:07 PM   Specimen: Nasopharyngeal Swab  Result Value Ref Range Status   SARS Coronavirus 2 by RT PCR NEGATIVE NEGATIVE Final    Comment: (NOTE) SARS-CoV-2 target nucleic acids are NOT DETECTED. The SARS-CoV-2 RNA is generally detectable in upper respiratoy specimens during the acute phase of infection. The lowest concentration of SARS-CoV-2 viral copies this assay can detect is 131 copies/mL. A negative result does not preclude SARS-Cov-2 infection and should not be used as  the sole basis for treatment or other patient management decisions. A negative result may occur with  improper specimen collection/handling, submission of specimen other than nasopharyngeal swab, presence of viral mutation(s) within the areas targeted by this assay, and inadequate number of viral copies (<131 copies/mL). A negative result must be combined with clinical observations, patient history, and epidemiological information. The expected result is Negative. Fact Sheet for Patients:  PinkCheek.be Fact Sheet for Healthcare Providers:  GravelBags.it This test is not yet ap proved or cleared by the [place] FDA and  has been authorized for detection and/or diagnosis of SARS-CoV-2 by FDA under an Emergency Use Authorization (EUA). This EUA will remain  in effect (meaning this test can be used) for the duration of the COVID-19 declaration under Section 564(b)(1) of the Act, 21 U.S.C. section 360bbb-3(b)(1), unless the authorization is terminated or revoked sooner.    Influenza A by PCR NEGATIVE NEGATIVE Final   Influenza B by PCR NEGATIVE NEGATIVE Final    Comment: (NOTE) The Xpert Xpress SARS-CoV-2/FLU/RSV assay is intended as an aid in  the diagnosis of influenza from Nasopharyngeal swab specimens and  should not be used as a sole basis for treatment. Nasal washings and  aspirates are unacceptable for Xpert Xpress SARS-CoV-2/FLU/RSV  testing. Fact Sheet  for Patients: PinkCheek.be Fact Sheet for Healthcare Providers: GravelBags.it This test is not yet approved or cleared by the [place] FDA and  has been authorized for detection and/or diagnosis of SARS-CoV-2 by  FDA under an Emergency Use Authorization (EUA). This EUA will remain  in effect (meaning this test can be used) for the duration of the  Covid-19 declaration under Section 564(b)(1) of the Act, 21   U.S.C. section 360bbb-3(b)(1), unless the authorization is  terminated or revoked. Performed at [facility], [address] [place], [place] [address]      Labs: BNP (last 3 results) No results for input(s): BNP in the last 8760 hours. Basic Metabolic Panel: Recent Labs  Lab 11/20/19 0652 11/20/19 0652 11/21/19 0459 11/22/19 0454 11/22/19 1000 11/23/19 0445 11/24/19 0427  NA 136  --  135 138  --  135 139  K 4.5   < > 5.1 5.7* 5.5* 4.8 4.2  CL 107  --  104 105  --  105 109  CO2 20*  --  22 23  --  22 23  GLUCOSE 217*  --  289* 261*  --  184* 137*  BUN 47*  --  54* 62*  --  66* 55*  CREATININE 1.24*  --  1.42* 1.72*  --  1.73* 1.47*  CALCIUM 8.3*  --  8.4* 8.6*  --  8.3* 8.0*  MG 1.9  --  2.5* 2.6*  --  2.5*  --    < > = values in this interval not displayed.   Liver Function Tests: Recent Labs  Lab 11/18/19 1005  AST 22  ALT 16  ALKPHOS 73  BILITOT 0.6  PROT 6.8  ALBUMIN 3.5   Recent Labs  Lab 11/18/19 1005  LIPASE 22   No results for input(s): AMMONIA in the last 168 hours. CBC: Recent Labs  Lab 11/18/19 1005 11/19/19 0451 11/20/19 [id number] 11/21/19 0459 11/22/19 0454 11/23/19 0445 11/24/19 0427  WBC 13.4*   < > 17.1* 16.4* 19.3* 18.1* 16.9*  NEUTROABS 9.9*  --  15.2* 15.2*  --   --   --   HGB 11.3*   < > 10.3* 10.2* 9.9* 9.6* 9.2*  HCT 36.9   < > 32.6* 31.7* 31.8* 30.4* 28.4*  MCV 92.5   < > 89.3 89.3 90.6 89.4 88.5  PLT 357   < > 318 332 341 332 329   < > = values in this interval not displayed.   Cardiac Enzymes: No results for input(s): CKTOTAL, CKMB, CKMBINDEX, TROPONINI in the last 168 hours. BNP: Invalid input(s): POCBNP CBG: Recent Labs  Lab 11/23/19 0758 11/23/19 1110 11/23/19 1613 11/23/19 2040 11/24/19 0852  GLUCAP 145* 191* 208* 190* 94   D-Dimer No results for input(s): DDIMER in the last 72 hours. Hgb A1c No results for input(s): HGBA1C in the last 72 hours. Lipid Profile No results for input(s): CHOL, HDL,  LDLCALC, TRIG, CHOLHDL, LDLDIRECT in the last 72 hours. Thyroid function studies No results for input(s): TSH, T4TOTAL, T3FREE, THYROIDAB in the last 72 hours.  Invalid input(s): FREET3 Anemia work up Recent Labs    11/22/19 1852  FERRITIN 68  TIBC 379  IRON 28   Urinalysis    Component Value Date/Time   COLORURINE YELLOW (A) 10/09/2016 0929   APPEARANCEUR CLEAR (A) 10/09/2016 0929   LABSPEC 1.009 10/09/2016 0929   PHURINE 5.0 10/09/2016 0929   GLUCOSEU NEGATIVE 10/09/2016 0929   HGBUR SMALL (A) 10/09/2016 0929   BILIRUBINUR  NEGATIVE 10/09/2016 0929   KETONESUR NEGATIVE 10/09/2016 0929   PROTEINUR NEGATIVE 10/09/2016 0929   NITRITE NEGATIVE 10/09/2016 0929   LEUKOCYTESUR NEGATIVE 10/09/2016 0929   Sepsis Labs Invalid input(s): PROCALCITONIN,  WBC,  LACTICIDVEN Microbiology Recent Results (from the past 240 hour(s))  Respiratory Panel by RT PCR (Flu A&B, Covid) - Nasopharyngeal Swab     Status: None   Collection Time: 11/18/19  2:07 PM   Specimen: Nasopharyngeal Swab  Result Value Ref Range Status   SARS Coronavirus 2 by RT PCR NEGATIVE NEGATIVE Final    Comment: (NOTE) SARS-CoV-2 target nucleic acids are NOT DETECTED. The SARS-CoV-2 RNA is generally detectable in upper respiratoy specimens during the acute phase of infection. The lowest concentration of SARS-CoV-2 viral copies this assay can detect is 131 copies/mL. A negative result does not preclude SARS-Cov-2 infection and should not be used as the sole basis for treatment or other patient management decisions. A negative result may occur with  improper specimen collection/handling, submission of specimen other than nasopharyngeal swab, presence of viral mutation(s) within the areas targeted by this assay, and inadequate number of viral copies (<131 copies/mL). A negative result must be combined with clinical observations, patient history, and epidemiological information. The expected result is Negative. Fact  Sheet for Patients:  PinkCheek.be Fact Sheet for Healthcare Providers:  GravelBags.it This test is not yet ap proved or cleared by the [place] FDA and  has been authorized for detection and/or diagnosis of SARS-CoV-2 by FDA under an Emergency Use Authorization (EUA). This EUA will remain  in effect (meaning this test can be used) for the duration of the COVID-19 declaration under Section 564(b)(1) of the Act, 21 U.S.C. section 360bbb-3(b)(1), unless the authorization is terminated or revoked sooner.    Influenza A by PCR NEGATIVE NEGATIVE Final   Influenza B by PCR NEGATIVE NEGATIVE Final    Comment: (NOTE) The Xpert Xpress SARS-CoV-2/FLU/RSV assay is intended as an aid in  the diagnosis of influenza from Nasopharyngeal swab specimens and  should not be used as a sole basis for treatment. Nasal washings and  aspirates are unacceptable for Xpert Xpress SARS-CoV-2/FLU/RSV  testing. Fact Sheet for Patients: PinkCheek.be Fact Sheet for Healthcare Providers: GravelBags.it This test is not yet approved or cleared by the [place] FDA and  has been authorized for detection and/or diagnosis of SARS-CoV-2 by  FDA under an Emergency Use Authorization (EUA). This EUA will remain  in effect (meaning this test can be used) for the duration of the  Covid-19 declaration under Section 564(b)(1) of the Act, 21  U.S.C. section 360bbb-3(b)(1), unless the authorization is  terminated or revoked. Performed at [facility], [address] [place], [place] [address]      Time coordinating discharge: Over 30 minutes  SIGNED:   Ezekiel Slocumb, DO [facility] 11/24/2019, 10:18 AM   If 7PM-7AM, please contact night-coverage [email or web address]

## 2019-11-24 NOTE — Progress Notes (Signed)
Discharge instructions explained, pt verbalized understanding. IV/tele removed. Will transport off unit via wheelchair when ride arrives.

## 2019-11-24 NOTE — Progress Notes (Signed)
[facility]  ROUNDING NOTE   Subjective:  Renal function is improved today.  Creatinine down to 1.47. BUN 55. Patient appears to be in good spirits.   Objective:  Vital signs in last 24 hours:  Temp:  [97.7 F (36.5 C)-98.4 F (36.9 C)] 97.7 F (36.5 C) (02/16 0737) Pulse Rate:  [98-118] 98 (02/16 0737) Resp:  [16-20] 16 (02/16 0737) BP: (111-134)/(61-73) 111/72 (02/16 0737) SpO2:  [97 %-100 %] 97 % (02/16 0737) Weight:  [108 kg] 108 kg (02/16 0455)  Weight change:  Filed Weights   11/18/19 0929 11/18/19 1632 11/24/19 0455  Weight: 97.5 kg 99.5 kg 108 kg    Intake/Output: I/O last 3 completed shifts: In: 120 [P.O.:120] Out: 650 [Urine:650]   Intake/Output this shift:  Total I/O In: 240 [P.O.:240] Out: -   Physical Exam: General: No acute distress  Head: Normocephalic, atraumatic. Moist oral mucosal membranes  Eyes: Anicteric  Neck: Supple, trachea midline  Lungs:  Clear to auscultation, normal effort  Heart: S1S2 no rubs  Abdomen:  Soft, nontender, bowel sounds present  Extremities: No peripheral edema.  Neurologic: Awake, alert, following commands  Skin: No lesions  Access:     Basic Metabolic Panel: Recent Labs  Lab 11/20/19 0652 11/20/19 0652 11/21/19 0459 11/21/19 0459 11/22/19 0454 11/22/19 1000 11/23/19 0445 11/24/19 0427  NA 136  --  135  --  138  --  135 139  K 4.5   < > 5.1  --  5.7* 5.5* 4.8 4.2  CL 107  --  104  --  105  --  105 109  CO2 20*  --  22  --  23  --  22 23  GLUCOSE 217*  --  289*  --  261*  --  184* 137*  BUN 47*  --  54*  --  62*  --  66* 55*  CREATININE 1.24*  --  1.42*  --  1.72*  --  1.73* 1.47*  CALCIUM 8.3*   < > 8.4*   < > 8.6*  --  8.3* 8.0*  MG 1.9  --  2.5*  --  2.6*  --  2.5*  --    < > = values in this interval not displayed.    Liver Function Tests: Recent Labs  Lab 11/18/19 1005  AST 22  ALT 16  ALKPHOS 73  BILITOT 0.6  PROT 6.8  ALBUMIN 3.5   Recent Labs  Lab 11/18/19 1005   LIPASE 22   No results for input(s): AMMONIA in the last 168 hours.  CBC: Recent Labs  Lab 11/18/19 1005 11/19/19 0451 11/20/19 [id number] 11/21/19 0459 11/22/19 0454 11/23/19 0445 11/24/19 0427  WBC 13.4*   < > 17.1* 16.4* 19.3* 18.1* 16.9*  NEUTROABS 9.9*  --  15.2* 15.2*  --   --   --   HGB 11.3*   < > 10.3* 10.2* 9.9* 9.6* 9.2*  HCT 36.9   < > 32.6* 31.7* 31.8* 30.4* 28.4*  MCV 92.5   < > 89.3 89.3 90.6 89.4 88.5  PLT 357   < > 318 332 341 332 329   < > = values in this interval not displayed.    Cardiac Enzymes: No results for input(s): CKTOTAL, CKMB, CKMBINDEX, TROPONINI in the last 168 hours.  BNP: Invalid input(s): POCBNP  CBG: Recent Labs  Lab 11/23/19 0758 11/23/19 1110 11/23/19 1613 11/23/19 2040 11/24/19 0852  GLUCAP 145* 191* 208* 190* 94    Microbiology: Results  for orders placed or performed during the hospital encounter of 11/18/19  Respiratory Panel by RT PCR (Flu A&B, Covid) - Nasopharyngeal Swab     Status: None   Collection Time: 11/18/19  2:07 PM   Specimen: Nasopharyngeal Swab  Result Value Ref Range Status   SARS Coronavirus 2 by RT PCR NEGATIVE NEGATIVE Final    Comment: (NOTE) SARS-CoV-2 target nucleic acids are NOT DETECTED. The SARS-CoV-2 RNA is generally detectable in upper respiratoy specimens during the acute phase of infection. The lowest concentration of SARS-CoV-2 viral copies this assay can detect is 131 copies/mL. A negative result does not preclude SARS-Cov-2 infection and should not be used as the sole basis for treatment or other patient management decisions. A negative result may occur with  improper specimen collection/handling, submission of specimen other than nasopharyngeal swab, presence of viral mutation(s) within the areas targeted by this assay, and inadequate number of viral copies (<131 copies/mL). A negative result must be combined with clinical observations, patient history, and epidemiological information.  The expected result is Negative. Fact Sheet for Patients:  PinkCheek.be Fact Sheet for Healthcare Providers:  GravelBags.it This test is not yet ap proved or cleared by the [place] FDA and  has been authorized for detection and/or diagnosis of SARS-CoV-2 by FDA under an Emergency Use Authorization (EUA). This EUA will remain  in effect (meaning this test can be used) for the duration of the COVID-19 declaration under Section 564(b)(1) of the Act, 21 U.S.C. section 360bbb-3(b)(1), unless the authorization is terminated or revoked sooner.    Influenza A by PCR NEGATIVE NEGATIVE Final   Influenza B by PCR NEGATIVE NEGATIVE Final    Comment: (NOTE) The Xpert Xpress SARS-CoV-2/FLU/RSV assay is intended as an aid in  the diagnosis of influenza from Nasopharyngeal swab specimens and  should not be used as a sole basis for treatment. Nasal washings and  aspirates are unacceptable for Xpert Xpress SARS-CoV-2/FLU/RSV  testing. Fact Sheet for Patients: PinkCheek.be Fact Sheet for Healthcare Providers: GravelBags.it This test is not yet approved or cleared by the [place] FDA and  has been authorized for detection and/or diagnosis of SARS-CoV-2 by  FDA under an Emergency Use Authorization (EUA). This EUA will remain  in effect (meaning this test can be used) for the duration of the  Covid-19 declaration under Section 564(b)(1) of the Act, 21  U.S.C. section 360bbb-3(b)(1), unless the authorization is  terminated or revoked. Performed at [facility], [address] [place], [place] [address]     Coagulation Studies: No results for input(s): LABPROT, INR in the last 72 hours.  Urinalysis: No results for input(s): COLORURINE, LABSPEC, PHURINE, GLUCOSEU, HGBUR, BILIRUBINUR, KETONESUR, PROTEINUR, UROBILINOGEN, NITRITE, LEUKOCYTESUR in the last 72  hours.  Invalid input(s): APPERANCEUR    Imaging: No results found.   Medications:   . sodium chloride 75 mL/hr at 11/23/19 2336   . allopurinol  100 mg Oral Daily  . dextromethorphan-guaiFENesin  1 tablet Oral BID  . diclofenac Sodium  2 g Topical QID  . DULoxetine  30 mg Oral Daily  . ferrous sulfate  325 mg Oral BID WC  . heparin injection (subcutaneous)  5,000 Units Subcutaneous Q8H  . insulin aspart  0-5 Units Subcutaneous QHS  . insulin aspart  0-9 Units Subcutaneous TID WC  . insulin aspart  7 Units Subcutaneous TID WC  . insulin detemir  8 Units Subcutaneous BID  . ipratropium-albuterol  3 mL Nebulization TID  . mometasone-formoterol  2 puff  Inhalation BID  . pantoprazole  40 mg Oral Daily  . polyethylene glycol  17 g Oral Daily  . predniSONE  40 mg Oral Q breakfast  . simvastatin  20 mg Oral Daily  . sodium chloride flush  10 mL Intravenous Q12H   albuterol, bisacodyl, fentaNYL (SUBLIMAZE) injection, hydrALAZINE, methocarbamol, metoprolol tartrate, morphine injection, ondansetron (ZOFRAN) IV, oxyCODONE  Assessment/ Plan:  69 y.o. female with a PMHx of COPD, diabetes mellitus type 2, GERD, hyperlipidemia, hypertension, peripheral neuropathy, osteoporosis, pulmonary fibrosis, sleep apnea, chronic kidney disease stage III yea baseline creatinine 1.4, who was admitted to [facility] on 11/18/2019 for evaluation of shortness of breath.  1.  Acute kidney injury/chronic kidney disease stage IIIA.  Baseline creatinine 1.4.  Creatinine now down to baseline at 1.4.  We will go ahead and discontinue IV fluids.  I have advised the patient that she will need follow-up of her underlying chronic kidney disease post discharge.  We will set this up.  2.  Hyperkalemia.  Potassium down to 4.2.  Patient off of sodium zirconium now.  We will reevaluate potassium as an outpatient.  3.  Anemia of chronic kidney disease/iron deficiency anemia.  Patient started on ferrous sulfate.  Recommend that  she check in with her primary care physician to make sure she is up-to-date on colonoscopy.   LOS: 5 Radwan Cowley 2/16/202111:14 AM

## 2019-11-27 ENCOUNTER — Ambulatory Visit
Admission: RE | Admit: 2019-11-27 | Discharge: 2019-11-27 | Disposition: A | Discharge status: Home / Self Care | Payer: Medicare Other | Source: Ambulatory Visit | Attending: Internal Medicine | Admitting: Internal Medicine

## 2019-11-27 ENCOUNTER — Other Ambulatory Visit: Payer: Self-pay

## 2019-11-27 ENCOUNTER — Other Ambulatory Visit: Payer: Self-pay | Admitting: Internal Medicine

## 2019-11-27 ENCOUNTER — Encounter (INDEPENDENT_AMBULATORY_CARE_PROVIDER_SITE_OTHER): Payer: Self-pay

## 2019-11-27 DIAGNOSIS — R600 Localized edema: Secondary | ICD-10-CM

## 2019-12-01 DIAGNOSIS — R600 Localized edema: Secondary | ICD-10-CM | POA: Insufficient documentation

## 2019-12-21 ENCOUNTER — Other Ambulatory Visit: Payer: Self-pay

## 2019-12-22 ENCOUNTER — Inpatient Hospital Stay: Payer: Medicare Other | Attending: Internal Medicine | Admitting: Internal Medicine

## 2019-12-22 ENCOUNTER — Encounter: Payer: Self-pay | Admitting: Internal Medicine

## 2019-12-22 ENCOUNTER — Encounter (INDEPENDENT_AMBULATORY_CARE_PROVIDER_SITE_OTHER): Payer: Self-pay

## 2019-12-22 DIAGNOSIS — Z87891 Personal history of nicotine dependence: Secondary | ICD-10-CM | POA: Diagnosis not present

## 2019-12-22 DIAGNOSIS — Z993 Dependence on wheelchair: Secondary | ICD-10-CM | POA: Diagnosis not present

## 2019-12-22 DIAGNOSIS — D631 Anemia in chronic kidney disease: Secondary | ICD-10-CM

## 2019-12-22 DIAGNOSIS — E1122 Type 2 diabetes mellitus with diabetic chronic kidney disease: Secondary | ICD-10-CM

## 2019-12-22 DIAGNOSIS — N1832 Chronic kidney disease, stage 3b: Secondary | ICD-10-CM | POA: Diagnosis present

## 2019-12-22 DIAGNOSIS — I129 Hypertensive chronic kidney disease with stage 1 through stage 4 chronic kidney disease, or unspecified chronic kidney disease: Secondary | ICD-10-CM | POA: Diagnosis not present

## 2019-12-22 DIAGNOSIS — Z803 Family history of malignant neoplasm of breast: Secondary | ICD-10-CM

## 2019-12-22 NOTE — Progress Notes (Signed)
[facility] NOTE  Patient Care Team: Tracie Harrier, MD as PCP - General (Internal Medicine)  CHIEF COMPLAINTS/PURPOSE OF CONSULTATION:    HEMATOLOGY HISTORY  #Chronic kidney disease-stage III/ ANEMIA: EGD [~10 y]/colonoscopy-awaiting in April 2021;[March 2021- Hb ~10.3; Iron sat- 15%]   # CKD [Dr.Lateef]-GFR $0s.    HISTORY OF PRESENTING ILLNESS:  [name] [name] 69 y.o.  female has been referred to [place] for further evaluation/work-up for anemia.  Patient admits to mild fatigue especially with exertion.  Otherwise denies any shortness of breath or cough.  Patient has been recommended iron tablets however she has not been taking it.   Blood in stools: None Change in bowel habits- None Blood in urine: None Difficulty swallowing: None Abnormal weight loss: None Iron supplementation: None Prior Blood transfusions: None Vaginal bleeding: None  Review of Systems  Constitutional: Positive for malaise/fatigue. Negative for chills, diaphoresis, fever and weight loss.  HENT: Negative for nosebleeds and sore throat.   Eyes: Negative for double vision.  Respiratory: Negative for cough, hemoptysis, sputum production, shortness of breath and wheezing.   Cardiovascular: Negative for chest pain, palpitations, orthopnea and leg swelling.  Gastrointestinal: Negative for abdominal pain, blood in stool, constipation, diarrhea, heartburn, melena, nausea and vomiting.  Genitourinary: Negative for dysuria, frequency and urgency.  Musculoskeletal: Positive for back pain and joint pain.  Skin: Negative.  Negative for itching and rash.  Neurological: Negative for dizziness, tingling, focal weakness, weakness and headaches.  Endo/Heme/Allergies: Does not bruise/bleed easily.  Psychiatric/Behavioral: Negative for depression. The patient is not nervous/anxious and does not have insomnia.     MEDICAL HISTORY:  Past Medical History:  Diagnosis Date  . Anemia   . Asthma   .  Chronic airway obstruction ([place])   . Diabetes mellitus without complication ([place])   . GERD (gastroesophageal reflux disease)   . History of adenomatous polyp of colon   . History of bone density study   . Hyperlipidemia   . Hypertension   . Idiopathic peripheral neuropathy   . Morbid obesity ([place])   . Osteoporosis   . Plantar fascial fibromatosis   . Pulmonary fibrosis ([place])   . Sleep apnea    Hypersomnia with sleep apnea  . TIA (transient ischemic attack)     SURGICAL HISTORY: Past Surgical History:  Procedure Laterality Date  . ABDOMINAL HYSTERECTOMY    . BACK SURGERY    . BLADDER SUSPENSION    . CESAREAN SECTION    . COLONOSCOPY    . DILATION AND CURETTAGE OF UTERUS    . ESOPHAGOGASTRODUODENOSCOPY    . LUMBAR LAMINECTOMY/DECOMPRESSION MICRODISCECTOMY N/A 10/15/2016   Procedure: L3-L5 Laminectomy ;  Surgeon: Deetta Perla, MD;  Location: [facility] ORS;  Service: Neurosurgery;  Laterality: N/A;  . LUMBAR WOUND DEBRIDEMENT N/A 11/05/2016   Procedure: LUMBAR WOUND DEBRIDEMENT;  Surgeon: Deetta Perla, MD;  Location: [facility] ORS;  Service: Neurosurgery;  Laterality: N/A;  . TUBAL LIGATION      SOCIAL HISTORY: Social History   Socioeconomic History  . Marital status: Single    Spouse name: Not on file  . Number of children: Not on file  . Years of education: Not on file  . Highest education level: Not on file  Occupational History  . Not on file  Tobacco Use  . Smoking status: Former Research scientist (life sciences)  . Smokeless tobacco: Never Used  Substance and Sexual Activity  . Alcohol use: No  . Drug use: No  . Sexual activity: Not on file  Other  Topics Concern  . Not on file  Social History Narrative   Lives in [place]; by self; walks with walker/gait instability; quit smoking 20 years ago; no alcohol.    Social Determinants of Health   Financial Resource Strain:   . Difficulty of Paying Living Expenses:   Food Insecurity:   . Worried About Charity fundraiser in the Last Year:   . Arts development officer in the Last Year:   Transportation Needs:   . Film/video editor (Medical):   Marland Kitchen Lack of Transportation (Non-Medical):   Physical Activity:   . Days of Exercise per Week:   . Minutes of Exercise per Session:   Stress:   . Feeling of Stress :   Social Connections:   . Frequency of Communication with Friends and Family:   . Frequency of Social Gatherings with Friends and Family:   . Attends Religious Services:   . Active Member of Clubs or Organizations:   . Attends Archivist Meetings:   Marland Kitchen Marital Status:   Intimate Partner Violence:   . Fear of Current or Ex-Partner:   . Emotionally Abused:   Marland Kitchen Physically Abused:   . Sexually Abused:     FAMILY HISTORY: Family History  Problem Relation Age of Onset  . Stroke Mother   . Heart attack Mother   . Breast cancer Sister 70  . Stroke Sister   . Heart attack Sister   . Breast cancer Cousin        maternal side 60's    ALLERGIES:  is allergic to acetaminophen-codeine; codeine; furosemide; penicillin v potassium; quinine; tramadol; and tylenol [acetaminophen].  MEDICATIONS:  Current Outpatient Medications  Medication Sig Dispense Refill  . albuterol (VENTOLIN HFA) 108 (90 Base) MCG/ACT inhaler Inhale 2 puffs into the lungs every 6 (six) hours as needed for wheezing or shortness of breath. 6.7 g 1  . allopurinol (ZYLOPRIM) 100 MG tablet Take 100 mg by mouth daily.    . budesonide-formoterol (SYMBICORT) 160-4.5 MCG/ACT inhaler Inhale 2 puffs into the lungs 2 (two) times daily.    . cyclobenzaprine (FLEXERIL) 5 MG tablet Take 5 mg by mouth 2 (two) times daily as needed for muscle spasms.    . diclofenac Sodium (VOLTAREN) 1 % GEL Apply 2 g topically 4 (four) times daily as needed (rib cage pain). 100 g 0  . DULoxetine (CYMBALTA) 30 MG capsule Take 30 mg by mouth daily.    Marland Kitchen esomeprazole (NEXIUM) 40 MG capsule Take 40 mg by mouth daily.     . ferrous sulfate 325 (65 FE) MG tablet Take 1 tablet (325 mg total) by  mouth 2 (two) times daily with a meal. 60 tablet 1  . glimepiride (AMARYL) 2 MG tablet Take 2 mg by mouth every morning.    Marland Kitchen losartan (COZAAR) 50 MG tablet Take 50 mg by mouth daily.    . metFORMIN (GLUCOPHAGE) 500 MG tablet Take 500 mg by mouth 2 (two) times daily with a meal.    . oxyCODONE (ROXICODONE) 5 MG immediate release tablet Take 1 tablet (5 mg total) by mouth every 4 (four) hours as needed for severe pain. (Patient taking differently: Take 5 mg by mouth 2 (two) times daily as needed for severe pain. ) 30 tablet 0  . predniSONE (DELTASONE) 5 MG tablet Take 5 mg by mouth daily.    . simvastatin (ZOCOR) 20 MG tablet Take 20 mg by mouth daily.     No current facility-administered medications  for this visit.      PHYSICAL EXAMINATION:   Vitals:   12/22/19 1135  BP: (!) 156/77  Pulse: 93  Resp: 18  Temp: (!) 97 F (36.1 C)  SpO2: 100%   Filed Weights   12/22/19 1135  Weight: 218 lb (98.9 kg)    Physical Exam  Constitutional: She is oriented to person, place, and time and well-developed, well-nourished, and in no distress.  Obese.  In a wheelchair.  HENT:  Head: Normocephalic and atraumatic.  Mouth/Throat: Oropharynx is clear and moist. No oropharyngeal exudate.  Eyes: Pupils are equal, round, and reactive to light.  Cardiovascular: Normal rate and regular rhythm.  Pulmonary/Chest: Effort normal and breath sounds normal. No respiratory distress. She has no wheezes.  Abdominal: Soft. Bowel sounds are normal. She exhibits no distension and no mass. There is no abdominal tenderness. There is no rebound and no guarding.  Musculoskeletal:        General: No tenderness or edema. Normal range of motion.     Cervical back: Normal range of motion and neck supple.  Neurological: She is alert and oriented to person, place, and time.  Skin: Skin is warm.  Psychiatric: Affect normal.    LABORATORY DATA:  I have reviewed the data as listed Lab Results  Component Value Date    WBC 16.9 (H) 11/24/2019   HGB 9.2 (L) 11/24/2019   HCT 28.4 (L) 11/24/2019   MCV 88.5 11/24/2019   PLT 329 11/24/2019   Recent Labs    11/14/19 1040 11/14/19 1040 11/18/19 1005 11/19/19 0451 11/22/19 0454 11/22/19 0454 11/22/19 1000 11/23/19 0445 11/24/19 0427  NA 140   < > 141   < > 138  --   --  135 139  K 4.4   < > 4.6   < > 5.7*   < > 5.5* 4.8 4.2  CL 104   < > 106   < > 105  --   --  105 109  CO2 22   < > 22   < > 23  --   --  22 23  GLUCOSE 209*   < > 174*   < > 261*  --   --  184* 137*  BUN 28*   < > 43*   < > 62*  --   --  66* 55*  CREATININE 1.52*   < > 2.20*   < > 1.72*  --   --  1.73* 1.47*  CALCIUM 9.2   < > 8.7*   < > 8.6*  --   --  8.3* 8.0*  GFRNONAA 35*   < > 23*   < > 30*  --   --  30* 37*  GFRAA 41*   < > 26*   < > 35*  --   --  35* 43*  PROT 7.2  --  6.8  --   --   --   --   --   --   ALBUMIN 3.8  --  3.5  --   --   --   --   --   --   AST 23  --  22  --   --   --   --   --   --   ALT 19  --  16  --   --   --   --   --   --   ALKPHOS 82  --  73  --   --   --   --   --   --  BILITOT 1.0  --  0.6  --   --   --   --   --   --    < > = values in this interval not displayed.     No results found.  Anemia due to stage 3b chronic kidney disease #Anemia secondary to chronic kidney disease-[nephrology office; March 2021]-iron saturation 15% hemoglobin 10.3; patient is minimally symptomatic.  Stressed importance of taking p.o. iron once a day.  Discussed the potential side effects-including dyspepsia/constipation.   #Discussed the possible need for IV iron if patient's blood counts not improve currently get worse. Discussed the potential acute infusion reactions with IV iron; which are quite rare.  Will discuss.  We will also investigate further etiologies like plasma cell disorders/next visit.  HOLD off bone marrow biopsy  # GFR 40s-diabetes/hypertension-followed by nephrology [Dr.Lateef].   # DISPOSITION: # no labs today [? needles] # Follow up in 3  months- MD- labs- cbc/cmp/ironstudies ferritin/MM panel;K/l light chains; B12/folate; Possible venofer--Dr.B  # Thank you Dr.Hande for allowing me to participate in the care of your pleasant patient. Please do not hesitate to contact me with questions or concerns in the interim.   All questions were answered. The patient knows to call the clinic with any problems, questions or concerns.    Cammie Sickle, MD 12/23/2019 7:46 AM

## 2019-12-22 NOTE — Assessment & Plan Note (Addendum)
#  Anemia secondary to chronic kidney disease-[nephrology office; March 2021]-iron saturation 15% hemoglobin 10.3; patient is minimally symptomatic.  Stressed importance of taking p.o. iron once a day.  Discussed the potential side effects-including dyspepsia/constipation.   #Discussed the possible need for IV iron if patient's blood counts not improve currently get worse. Discussed the potential acute infusion reactions with IV iron; which are quite rare.  Will discuss.  We will also investigate further etiologies like plasma cell disorders/next visit.  HOLD off bone marrow biopsy  # GFR 40s-diabetes/hypertension-followed by nephrology [Dr.Lateef].   # DISPOSITION: # no labs today [? needles] # Follow up in 3 months- MD- labs- cbc/cmp/ironstudies ferritin/MM panel;K/l light chains; B12/folate; Possible venofer--Dr.B  # Thank you Dr.Hande for allowing me to participate in the care of your pleasant patient. Please do not hesitate to contact me with questions or concerns in the interim.

## 2019-12-22 NOTE — Patient Instructions (Signed)
#  Please take iron pills twice a day as recommended.

## 2020-01-02 ENCOUNTER — Other Ambulatory Visit: Payer: Self-pay

## 2020-01-02 ENCOUNTER — Inpatient Hospital Stay
Admission: EM | Admit: 2020-01-02 | Discharge: 2020-01-08 | DRG: 872 | Disposition: A | Discharge status: Home / Self Care | Payer: Medicare Other | Attending: Internal Medicine | Admitting: Internal Medicine

## 2020-01-02 ENCOUNTER — Emergency Department: Payer: Medicare Other

## 2020-01-02 ENCOUNTER — Observation Stay: Payer: Medicare Other

## 2020-01-02 DIAGNOSIS — N1831 Chronic kidney disease, stage 3a: Secondary | ICD-10-CM

## 2020-01-02 DIAGNOSIS — Z823 Family history of stroke: Secondary | ICD-10-CM

## 2020-01-02 DIAGNOSIS — L03113 Cellulitis of right upper limb: Secondary | ICD-10-CM | POA: Diagnosis present

## 2020-01-02 DIAGNOSIS — Z86010 Personal history of colonic polyps: Secondary | ICD-10-CM

## 2020-01-02 DIAGNOSIS — F329 Major depressive disorder, single episode, unspecified: Secondary | ICD-10-CM | POA: Diagnosis present

## 2020-01-02 DIAGNOSIS — J8410 Pulmonary fibrosis, unspecified: Secondary | ICD-10-CM | POA: Diagnosis not present

## 2020-01-02 DIAGNOSIS — A499 Bacterial infection, unspecified: Secondary | ICD-10-CM

## 2020-01-02 DIAGNOSIS — M79631 Pain in right forearm: Secondary | ICD-10-CM | POA: Diagnosis present

## 2020-01-02 DIAGNOSIS — A419 Sepsis, unspecified organism: Principal | ICD-10-CM | POA: Diagnosis present

## 2020-01-02 DIAGNOSIS — L0390 Cellulitis, unspecified: Secondary | ICD-10-CM | POA: Diagnosis present

## 2020-01-02 DIAGNOSIS — K219 Gastro-esophageal reflux disease without esophagitis: Secondary | ICD-10-CM | POA: Diagnosis present

## 2020-01-02 DIAGNOSIS — J45909 Unspecified asthma, uncomplicated: Secondary | ICD-10-CM | POA: Diagnosis present

## 2020-01-02 DIAGNOSIS — M810 Age-related osteoporosis without current pathological fracture: Secondary | ICD-10-CM | POA: Diagnosis present

## 2020-01-02 DIAGNOSIS — E1129 Type 2 diabetes mellitus with other diabetic kidney complication: Secondary | ICD-10-CM | POA: Diagnosis present

## 2020-01-02 DIAGNOSIS — Z87891 Personal history of nicotine dependence: Secondary | ICD-10-CM

## 2020-01-02 DIAGNOSIS — I129 Hypertensive chronic kidney disease with stage 1 through stage 4 chronic kidney disease, or unspecified chronic kidney disease: Secondary | ICD-10-CM | POA: Diagnosis present

## 2020-01-02 DIAGNOSIS — L0291 Cutaneous abscess, unspecified: Secondary | ICD-10-CM

## 2020-01-02 DIAGNOSIS — J449 Chronic obstructive pulmonary disease, unspecified: Secondary | ICD-10-CM | POA: Diagnosis present

## 2020-01-02 DIAGNOSIS — E785 Hyperlipidemia, unspecified: Secondary | ICD-10-CM | POA: Diagnosis present

## 2020-01-02 DIAGNOSIS — D509 Iron deficiency anemia, unspecified: Secondary | ICD-10-CM | POA: Diagnosis present

## 2020-01-02 DIAGNOSIS — E1122 Type 2 diabetes mellitus with diabetic chronic kidney disease: Secondary | ICD-10-CM | POA: Diagnosis present

## 2020-01-02 DIAGNOSIS — Z803 Family history of malignant neoplasm of breast: Secondary | ICD-10-CM

## 2020-01-02 DIAGNOSIS — Z20822 Contact with and (suspected) exposure to covid-19: Secondary | ICD-10-CM | POA: Diagnosis present

## 2020-01-02 DIAGNOSIS — I10 Essential (primary) hypertension: Secondary | ICD-10-CM | POA: Diagnosis present

## 2020-01-02 DIAGNOSIS — Z8673 Personal history of transient ischemic attack (TIA), and cerebral infarction without residual deficits: Secondary | ICD-10-CM

## 2020-01-02 DIAGNOSIS — Z90710 Acquired absence of both cervix and uterus: Secondary | ICD-10-CM

## 2020-01-02 DIAGNOSIS — N183 Chronic kidney disease, stage 3 unspecified: Secondary | ICD-10-CM | POA: Diagnosis present

## 2020-01-02 DIAGNOSIS — Z8249 Family history of ischemic heart disease and other diseases of the circulatory system: Secondary | ICD-10-CM

## 2020-01-02 LAB — URINALYSIS, COMPLETE (UACMP) WITH MICROSCOPIC
Bacteria, UA: NONE SEEN
Bilirubin Urine: NEGATIVE
Glucose, UA: 150 mg/dL — AB
Hgb urine dipstick: NEGATIVE
Ketones, ur: NEGATIVE mg/dL
Leukocytes,Ua: NEGATIVE
Nitrite: NEGATIVE
Protein, ur: 30 mg/dL — AB
Specific Gravity, Urine: 1.025 (ref 1.005–1.030)
pH: 5 (ref 5.0–8.0)

## 2020-01-02 LAB — CBC WITH DIFFERENTIAL/PLATELET
Abs Immature Granulocytes: 0.45 10*3/uL — ABNORMAL HIGH (ref 0.00–0.07)
Basophils Absolute: 0.1 10*3/uL (ref 0.0–0.1)
Basophils Relative: 0 %
Eosinophils Absolute: 0.2 10*3/uL (ref 0.0–0.5)
Eosinophils Relative: 1 %
HCT: 39 % (ref 36.0–46.0)
Hemoglobin: 12.2 g/dL (ref 12.0–15.0)
Immature Granulocytes: 2 %
Lymphocytes Relative: 9 %
Lymphs Abs: 2.3 10*3/uL (ref 0.7–4.0)
MCH: 28 pg (ref 26.0–34.0)
MCHC: 31.3 g/dL (ref 30.0–36.0)
MCV: 89.7 fL (ref 80.0–100.0)
Monocytes Absolute: 1.9 10*3/uL — ABNORMAL HIGH (ref 0.1–1.0)
Monocytes Relative: 8 %
Neutro Abs: 21 10*3/uL — ABNORMAL HIGH (ref 1.7–7.7)
Neutrophils Relative %: 80 %
Platelets: 273 10*3/uL (ref 150–400)
RBC: 4.35 MIL/uL (ref 3.87–5.11)
RDW: 16.9 % — ABNORMAL HIGH (ref 11.5–15.5)
Smear Review: NORMAL
WBC: 26 10*3/uL — ABNORMAL HIGH (ref 4.0–10.5)
nRBC: 0 % (ref 0.0–0.2)

## 2020-01-02 LAB — SEDIMENTATION RATE: Sed Rate: 43 mm/hr — ABNORMAL HIGH (ref 0–30)

## 2020-01-02 LAB — BASIC METABOLIC PANEL
Anion gap: 12 (ref 5–15)
BUN: 25 mg/dL — ABNORMAL HIGH (ref 8–23)
CO2: 20 mmol/L — ABNORMAL LOW (ref 22–32)
Calcium: 8.8 mg/dL — ABNORMAL LOW (ref 8.9–10.3)
Chloride: 104 mmol/L (ref 98–111)
Creatinine, Ser: 1.06 mg/dL — ABNORMAL HIGH (ref 0.44–1.00)
GFR calc Af Amer: >60 mL/min (ref >60)
GFR calc non Af Amer: 55 mL/min — ABNORMAL LOW (ref >60)
Glucose, Bld: 242 mg/dL — ABNORMAL HIGH (ref 70–99)
Potassium: 4.6 mmol/L (ref 3.5–5.1)
Sodium: 136 mmol/L (ref 135–145)

## 2020-01-02 LAB — URIC ACID: Uric Acid, Serum: 4.5 mg/dL (ref 2.5–7.1)

## 2020-01-02 LAB — PROCALCITONIN: Procalcitonin: <0.1 ng/mL

## 2020-01-02 LAB — GLUCOSE, CAPILLARY
Glucose-Capillary: 136 mg/dL — ABNORMAL HIGH (ref 70–99)
Glucose-Capillary: 308 mg/dL — ABNORMAL HIGH (ref 70–99)

## 2020-01-02 LAB — LACTIC ACID, PLASMA
Lactic Acid, Venous: 2.5 mmol/L (ref 0.5–1.9)
Lactic Acid, Venous: 3.4 mmol/L (ref 0.5–1.9)
Lactic Acid, Venous: 2.6 mmol/L (ref 0.5–1.9)
Lactic Acid, Venous: 2.5 mmol/L (ref 0.5–1.9)

## 2020-01-02 LAB — SARS CORONAVIRUS 2 (TAT 6-24 HRS): SARS Coronavirus 2: NEGATIVE

## 2020-01-02 LAB — C-REACTIVE PROTEIN: CRP: 6.8 mg/dL — ABNORMAL HIGH (ref <1.0)

## 2020-01-02 MED ORDER — OXYCODONE-ACETAMINOPHEN 5-325 MG PO TABS
1.0000 | ORAL_TABLET | Freq: Once | ORAL | Status: AC
  Administered 2020-01-02: 1 via ORAL
  Filled 2020-01-02: qty 1

## 2020-01-02 MED ORDER — MOMETASONE FURO-FORMOTEROL FUM 200-5 MCG/ACT IN AERO
2.0000 | INHALATION_SPRAY | Freq: Two times a day (BID) | RESPIRATORY_TRACT | Status: DC
  Administered 2020-01-02 – 2020-01-08 (×12): 2 via RESPIRATORY_TRACT
  Filled 2020-01-02: qty 8.8

## 2020-01-02 MED ORDER — MORPHINE SULFATE (PF) 4 MG/ML IV SOLN
4.0000 mg | Freq: Once | INTRAVENOUS | Status: AC
  Administered 2020-01-02: 4 mg via INTRAVENOUS
  Filled 2020-01-02: qty 1

## 2020-01-02 MED ORDER — SODIUM CHLORIDE 0.9 % IV SOLN
2.0000 g | Freq: Once | INTRAVENOUS | Status: AC
  Administered 2020-01-02: 2 g via INTRAVENOUS
  Filled 2020-01-02: qty 20

## 2020-01-02 MED ORDER — ONDANSETRON HCL 4 MG/2ML IJ SOLN: 4.0000 mg | Freq: Three times a day (TID) | INTRAMUSCULAR | Status: DC | PRN

## 2020-01-02 MED ORDER — ENOXAPARIN SODIUM 40 MG/0.4ML ~~LOC~~ SOLN
40.0000 mg | SUBCUTANEOUS | Status: DC
  Administered 2020-01-02 – 2020-01-04 (×3): 40 mg via SUBCUTANEOUS
  Filled 2020-01-02 (×3): qty 0.4

## 2020-01-02 MED ORDER — VANCOMYCIN HCL 750 MG/150ML IV SOLN
750.0000 mg | INTRAVENOUS | Status: DC
  Administered 2020-01-03 – 2020-01-07 (×5): 750 mg via INTRAVENOUS
  Filled 2020-01-02 (×6): qty 150

## 2020-01-02 MED ORDER — SODIUM CHLORIDE 0.9 % IV SOLN: INTRAVENOUS | Status: DC

## 2020-01-02 MED ORDER — FERROUS SULFATE 325 (65 FE) MG PO TABS
325.0000 mg | ORAL_TABLET | Freq: Two times a day (BID) | ORAL | Status: DC
  Administered 2020-01-02 – 2020-01-08 (×12): 325 mg via ORAL
  Filled 2020-01-02 (×12): qty 1

## 2020-01-02 MED ORDER — SODIUM CHLORIDE 0.9% FLUSH
10.0000 mL | Freq: Two times a day (BID) | INTRAVENOUS | Status: DC
  Administered 2020-01-02 – 2020-01-08 (×7): 10 mL

## 2020-01-02 MED ORDER — INSULIN ASPART 100 UNIT/ML ~~LOC~~ SOLN
0.0000 [IU] | Freq: Every day | SUBCUTANEOUS | Status: DC
  Administered 2020-01-02: 22:00:00 4 [IU] via SUBCUTANEOUS
  Administered 2020-01-03 – 2020-01-07 (×2): 2 [IU] via SUBCUTANEOUS
  Filled 2020-01-02 (×3): qty 1

## 2020-01-02 MED ORDER — PANTOPRAZOLE SODIUM 40 MG PO TBEC
40.0000 mg | DELAYED_RELEASE_TABLET | Freq: Every day | ORAL | Status: DC
  Administered 2020-01-03 – 2020-01-08 (×6): 40 mg via ORAL
  Filled 2020-01-02 (×6): qty 1

## 2020-01-02 MED ORDER — MORPHINE SULFATE (PF) 2 MG/ML IV SOLN
2.0000 mg | INTRAVENOUS | Status: DC | PRN
  Administered 2020-01-02 – 2020-01-04 (×7): 2 mg via INTRAVENOUS
  Filled 2020-01-02 (×8): qty 1

## 2020-01-02 MED ORDER — SODIUM CHLORIDE 0.9 % IV BOLUS
1500.0000 mL | Freq: Once | INTRAVENOUS | Status: AC
  Administered 2020-01-02: 1500 mL via INTRAVENOUS

## 2020-01-02 MED ORDER — SIMVASTATIN 20 MG PO TABS
20.0000 mg | ORAL_TABLET | Freq: Every day | ORAL | Status: DC
  Administered 2020-01-03 – 2020-01-08 (×6): 20 mg via ORAL
  Filled 2020-01-02 (×6): qty 1

## 2020-01-02 MED ORDER — SODIUM CHLORIDE 0.9% FLUSH: 10.0000 mL | INTRAVENOUS | Status: DC | PRN

## 2020-01-02 MED ORDER — VANCOMYCIN HCL IN DEXTROSE 1-5 GM/200ML-% IV SOLN
1000.0000 mg | Freq: Once | INTRAVENOUS | Status: AC
  Administered 2020-01-02: 20:00:00 1000 mg via INTRAVENOUS
  Filled 2020-01-02: qty 200

## 2020-01-02 MED ORDER — ALBUTEROL SULFATE (2.5 MG/3ML) 0.083% IN NEBU: 3.0000 mL | INHALATION_SOLUTION | RESPIRATORY_TRACT | Status: DC | PRN

## 2020-01-02 MED ORDER — VANCOMYCIN HCL IN DEXTROSE 1-5 GM/200ML-% IV SOLN
1000.0000 mg | Freq: Once | INTRAVENOUS | Status: AC
  Administered 2020-01-02: 1000 mg via INTRAVENOUS
  Filled 2020-01-02: qty 200

## 2020-01-02 MED ORDER — HYDRALAZINE HCL 50 MG PO TABS: 25.0000 mg | ORAL_TABLET | Freq: Three times a day (TID) | ORAL | Status: DC | PRN

## 2020-01-02 MED ORDER — DULOXETINE HCL 30 MG PO CPEP
30.0000 mg | ORAL_CAPSULE | Freq: Every day | ORAL | Status: DC
  Administered 2020-01-03 – 2020-01-08 (×6): 30 mg via ORAL
  Filled 2020-01-02 (×6): qty 1

## 2020-01-02 MED ORDER — OXYCODONE HCL 5 MG PO TABS
5.0000 mg | ORAL_TABLET | Freq: Four times a day (QID) | ORAL | Status: DC | PRN
  Administered 2020-01-02 – 2020-01-08 (×11): 5 mg via ORAL
  Filled 2020-01-02 (×11): qty 1

## 2020-01-02 MED ORDER — LACTATED RINGERS IV BOLUS (SEPSIS)
1000.0000 mL | Freq: Once | INTRAVENOUS | Status: AC
  Administered 2020-01-02: 1000 mL via INTRAVENOUS

## 2020-01-02 MED ORDER — SODIUM CHLORIDE 0.9 % IV SOLN
2.0000 g | INTRAVENOUS | Status: DC
  Administered 2020-01-03 – 2020-01-08 (×6): 2 g via INTRAVENOUS
  Filled 2020-01-02: qty 2
  Filled 2020-01-02 (×2): qty 20
  Filled 2020-01-02 (×2): qty 2
  Filled 2020-01-02 (×2): qty 20

## 2020-01-02 MED ORDER — DM-GUAIFENESIN ER 30-600 MG PO TB12
1.0000 | ORAL_TABLET | Freq: Two times a day (BID) | ORAL | Status: DC
  Administered 2020-01-02 – 2020-01-08 (×12): 1 via ORAL
  Filled 2020-01-02 (×12): qty 1

## 2020-01-02 MED ORDER — ALLOPURINOL 100 MG PO TABS
100.0000 mg | ORAL_TABLET | Freq: Every day | ORAL | Status: DC
  Administered 2020-01-03 – 2020-01-08 (×6): 100 mg via ORAL
  Filled 2020-01-02 (×6): qty 1

## 2020-01-02 MED ORDER — INSULIN ASPART 100 UNIT/ML ~~LOC~~ SOLN
0.0000 [IU] | Freq: Three times a day (TID) | SUBCUTANEOUS | Status: DC
  Administered 2020-01-02 – 2020-01-03 (×2): 1 [IU] via SUBCUTANEOUS
  Administered 2020-01-03: 3 [IU] via SUBCUTANEOUS
  Administered 2020-01-03 – 2020-01-04 (×3): 2 [IU] via SUBCUTANEOUS
  Administered 2020-01-04 – 2020-01-05 (×4): 1 [IU] via SUBCUTANEOUS
  Administered 2020-01-06: 13:00:00 3 [IU] via SUBCUTANEOUS
  Administered 2020-01-06 – 2020-01-07 (×3): 1 [IU] via SUBCUTANEOUS
  Administered 2020-01-07: 09:00:00 2 [IU] via SUBCUTANEOUS
  Administered 2020-01-07 – 2020-01-08 (×2): 3 [IU] via SUBCUTANEOUS
  Administered 2020-01-08: 1 [IU] via SUBCUTANEOUS
  Filled 2020-01-02 (×18): qty 1

## 2020-01-02 NOTE — ED Triage Notes (Signed)
Pt states R arm pain from elbow to wrist. States saw her doctor at [facility] and was given a steroid taper a few weeks ago. States R arm pain came back. Denies hx of gout. Denies injury to arm.

## 2020-01-02 NOTE — Progress Notes (Signed)
Notified bedside nurse of need to draw repeat lactic acid. 2nd lactic climbed to 3.4. 3rd lactic is ordered but currently the order is being held. Have asked the bedside RN to release order so the 3rd lactic can be drawn.

## 2020-01-02 NOTE — Consult Note (Signed)
CODE SEPSIS - PHARMACY COMMUNICATION  **Broad Spectrum Antibiotics should be administered within 1 hour of Sepsis diagnosis**  Time Code Sepsis Called/Page Received: 1313  Antibiotics Ordered: ceftriaxone and vancomycin   Time of 1st antibiotic administration: [place] ,PharmD Clinical Pharmacist  01/02/2020  1:44 PM

## 2020-01-02 NOTE — ED Notes (Signed)
First Nurse Note: Pt to ED c/o Right arm pain. Pt is in NAD.

## 2020-01-02 NOTE — H&P (Signed)
History and Physical    [name] [name] KFE:[id number] DOB: 1950-10-24 DOA: 01/02/2020  Referring MD/NP/PA:   PCP: Tracie Harrier, MD   Patient coming from:  The patient is coming from home.  At baseline, pt is independent for most of ADL.        Chief Complaint: right forearm pain  HPI: [name] [name] is a 69 y.o. female with medical history significant of hypertension, hyperlipidemia, diabetes mellitus, COPD, asthma, TIA, GERD, depression, pulmonary fibrosis, iron deficiency anemia, CKD stage III, who presents with right forearm pain.  Patient states that she has been having right forearm pain and swelling for almost 3 weeks. Patient states 2 weeks ago at the orthopedic clinic and was given tapered steroids and antibiotics. Patient states she took Keflex and Medrol Dosepak. She states that her arm pain initially improved, but returned back after she finished taking medicine.  The pain is constant, sharp, 10 out of 10 severity.  Patient has mild chronic shortness breath, which has not changed.  No cough, chest pain, fever or chills.  No nausea, vomiting, diarrhea, abdominal pain, symptoms of UTI or unilateral weakness.  ED Course: pt was found to have WBC 26.0, lactic acid 2.5, pending urinalysis, pending COVID-19 PCR, stable renal function, temperature normal, blood pressure 126/87, tachycardia, tachypnea, oxygen saturation 100% on room air.  X-ray of right arm is negative for fracture.  Review of Systems:   General: no fevers, chills, no body weight gain, has fatigue HEENT: no blurry vision, hearing changes or sore throat Respiratory: has dyspnea, no coughing, wheezing CV: no chest pain, no palpitations GI: no nausea, vomiting, abdominal pain, diarrhea, constipation GU: no dysuria, burning on urination, increased urinary frequency, hematuria  Ext: no leg edema. Has tenderness in swelling in right forearm Neuro: no unilateral weakness, numbness, or tingling, no vision change or hearing  loss Skin: no rash, no skin tear. MSK: No muscle spasm, no deformity, no limitation of range of movement in spin Heme: No easy bruising.  Travel history: No recent long distant travel.  Allergy:  Allergies  Allergen Reactions  . Acetaminophen-Codeine Other (See Comments)    Other reaction(s): Unknown   . Codeine Nausea And Vomiting  . Furosemide Other (See Comments)  . Penicillin V Potassium     Other reaction(s): Unknown  . Quinine     Other reaction(s): Unknown  . Tramadol     Other reaction(s): Unknown  . Tylenol [Acetaminophen]     Tylenol-Codeine: Dizziness    Past Medical History:  Diagnosis Date  . Anemia   . Asthma   . Chronic airway obstruction ([place])   . Diabetes mellitus without complication ([place])   . GERD (gastroesophageal reflux disease)   . History of adenomatous polyp of colon   . History of bone density study   . Hyperlipidemia   . Hypertension   . Idiopathic peripheral neuropathy   . Morbid obesity ([place])   . Osteoporosis   . Plantar fascial fibromatosis   . Pulmonary fibrosis ([place])   . Sleep apnea    Hypersomnia with sleep apnea  . TIA (transient ischemic attack)     Past Surgical History:  Procedure Laterality Date  . ABDOMINAL HYSTERECTOMY    . BACK SURGERY    . BLADDER SUSPENSION    . CESAREAN SECTION    . COLONOSCOPY    . DILATION AND CURETTAGE OF UTERUS    . ESOPHAGOGASTRODUODENOSCOPY    . LUMBAR LAMINECTOMY/DECOMPRESSION MICRODISCECTOMY N/A 10/15/2016   Procedure: L3-L5 Laminectomy ;  Surgeon: Deetta Perla, MD;  Location: [facility] ORS;  Service: Neurosurgery;  Laterality: N/A;  . LUMBAR WOUND DEBRIDEMENT N/A 11/05/2016   Procedure: LUMBAR WOUND DEBRIDEMENT;  Surgeon: Deetta Perla, MD;  Location: [facility] ORS;  Service: Neurosurgery;  Laterality: N/A;  . TUBAL LIGATION      Social History:  reports that she has quit smoking. She has never used smokeless tobacco. She reports that she does not drink alcohol or use drugs.  Family History:  Family  History  Problem Relation Age of Onset  . Stroke Mother   . Heart attack Mother   . Breast cancer Sister 5  . Stroke Sister   . Heart attack Sister   . Breast cancer Cousin        maternal side 60's     Prior to Admission medications   Medication Sig Start Date End Date Taking? Authorizing Provider  albuterol (VENTOLIN HFA) 108 (90 Base) MCG/ACT inhaler Inhale 2 puffs into the lungs every 6 (six) hours as needed for wheezing or shortness of breath. 11/24/19  Yes Nicole Kindred A, DO  allopurinol (ZYLOPRIM) 100 MG tablet Take 100 mg by mouth daily.   Yes [provider]  budesonide-formoterol (SYMBICORT) 160-4.5 MCG/ACT inhaler Inhale 2 puffs into the lungs 2 (two) times daily.   Yes [provider]  cyclobenzaprine (FLEXERIL) 5 MG tablet Take 5 mg by mouth 2 (two) times daily as needed for muscle spasms.   Yes [provider]  diclofenac Sodium (VOLTAREN) 1 % GEL Apply 2 g topically 4 (four) times daily as needed (rib cage pain). 11/24/19  Yes Nicole Kindred A, DO  DULoxetine (CYMBALTA) 30 MG capsule Take 30 mg by mouth daily. 07/17/19  Yes [provider]  esomeprazole (NEXIUM) 40 MG capsule Take 40 mg by mouth daily.    Yes [provider]  ferrous sulfate 325 (65 FE) MG tablet Take 1 tablet (325 mg total) by mouth 2 (two) times daily with a meal. 11/24/19  Yes Nicole Kindred A, DO  glimepiride (AMARYL) 2 MG tablet Take 2 mg by mouth every morning. 07/23/19  Yes [provider]  losartan (COZAAR) 50 MG tablet Take 50 mg by mouth daily. 07/28/19  Yes [provider]  metFORMIN (GLUCOPHAGE) 500 MG tablet Take 500 mg by mouth 2 (two) times daily with a meal.   Yes [provider]  oxyCODONE (ROXICODONE) 5 MG immediate release tablet Take 1 tablet (5 mg total) by mouth every 4 (four) hours as needed for severe pain. Patient taking differently: Take 5 mg by mouth 2 (two) times daily as needed for severe pain.  11/14/19  Yes  Earleen Newport, MD  predniSONE (DELTASONE) 2.5 MG tablet Take 2.5 mg by mouth daily.    Yes [provider]  simvastatin (ZOCOR) 20 MG tablet Take 20 mg by mouth daily.   Yes [provider]    Physical Exam: Vitals:   01/02/20 1030 01/02/20 1430 01/02/20 1500 01/02/20 1600  BP:  137/77 137/88 118/75  Pulse:  89 91 87  Resp:  '20 16 19  ' Temp:      TempSrc:      SpO2:  99% 99% 100%  Weight: 96.6 kg     Height: '4\' 11"'  (1.499 m)      General: Not in acute distress HEENT:       Eyes: PERRL, EOMI, no scleral icterus.       ENT: No discharge from the ears and nose, no pharynx injection,  no tonsillar enlargement.        Neck: No JVD, no bruit, no mass felt. Heme: No neck lymph node enlargement. Cardiac: S1/S2, RRR, No murmurs, No gallops or rubs. Respiratory: No rales, wheezing, rhonchi or rubs. GI: Soft, nondistended, nontender, no rebound pain, no organomegaly, BS present. GU: No hematuria Ext: No pitting leg edema bilaterally. 1+DP/PT pulse bilaterally. Has tenderness, erythema, warmth, swelling in right forearm, forearm elbow to wrist.  Musculoskeletal: No joint deformities, No joint redness or warmth, no limitation of ROM in spin. Skin: No rashes.  Neuro: Alert, oriented X3, cranial nerves II-XII grossly intact, moves all extremities normally.  Psych: Patient is not psychotic, no suicidal or hemocidal ideation.  Labs on Admission: I have personally reviewed following labs and imaging studies  CBC: Recent Labs  Lab 01/02/20 1112  WBC 26.0*  NEUTROABS 21.0*  HGB 12.2  HCT 39.0  MCV 89.7  PLT 563   Basic Metabolic Panel: Recent Labs  Lab 01/02/20 1112  NA 136  K 4.6  CL 104  CO2 20*  GLUCOSE 242*  BUN 25*  CREATININE 1.06*  CALCIUM 8.8*   GFR: Estimated Creatinine Clearance: 53.2 mL/min (A) (by C-G formula based on SCr of 1.06 mg/dL (H)). Liver Function Tests: No results for input(s): AST, ALT, ALKPHOS, BILITOT, PROT, ALBUMIN in the last  168 hours. No results for input(s): LIPASE, AMYLASE in the last 168 hours. No results for input(s): AMMONIA in the last 168 hours. Coagulation Profile: No results for input(s): INR, PROTIME in the last 168 hours. Cardiac Enzymes: No results for input(s): CKTOTAL, CKMB, CKMBINDEX, TROPONINI in the last 168 hours. BNP (last 3 results) No results for input(s): PROBNP in the last 8760 hours. HbA1C: No results for input(s): HGBA1C in the last 72 hours. CBG: Recent Labs  Lab 01/02/20 1656  GLUCAP 136*   Lipid Profile: No results for input(s): CHOL, HDL, LDLCALC, TRIG, CHOLHDL, LDLDIRECT in the last 72 hours. Thyroid Function Tests: No results for input(s): TSH, T4TOTAL, FREET4, T3FREE, THYROIDAB in the last 72 hours. Anemia Panel: No results for input(s): VITAMINB12, FOLATE, FERRITIN, TIBC, IRON, RETICCTPCT in the last 72 hours. Urine analysis:    Component Value Date/Time   COLORURINE YELLOW (A) 10/09/2016 0929   APPEARANCEUR CLEAR (A) 10/09/2016 0929   LABSPEC 1.009 10/09/2016 0929   PHURINE 5.0 10/09/2016 0929   GLUCOSEU NEGATIVE 10/09/2016 0929   HGBUR SMALL (A) 10/09/2016 0929   BILIRUBINUR NEGATIVE 10/09/2016 0929   KETONESUR NEGATIVE 10/09/2016 0929   PROTEINUR NEGATIVE 10/09/2016 0929   NITRITE NEGATIVE 10/09/2016 0929   LEUKOCYTESUR NEGATIVE 10/09/2016 0929   Sepsis Labs: '@LABRCNTIP' (procalcitonin:4,lacticidven:4) )No results found for this or any previous visit (from the past 240 hour(s)).   Radiological Exams on Admission: DG Forearm Right  Result Date: 01/02/2020 CLINICAL DATA:  Pain without trauma x2 weeks EXAM: RIGHT FOREARM - 2 VIEW COMPARISON:  None. FINDINGS: There is no evidence of fracture or other focal bone lesions. Soft tissues are unremarkable. IMPRESSION: Negative. Electronically Signed   By: Lucrezia Europe M.D.   On: 01/02/2020 11:40     EKG: Not done in ED, will get one.   Assessment/Plan Principal Problem:   Cellulitis of right arm Active  Problems:   CKD (chronic kidney disease), stage IIIa   Type II diabetes mellitus with renal manifestations (HCC)   Hyperlipidemia   Pulmonary fibrosis (HCC)   Sepsis (HCC)   Asthma   HTN (hypertension)   Sepsis due to cellulitis of right arm: Patient meets  critical for sepsis with leukocytosis, tachycardia, elevated lactic acid.  Currently hemodynamically stable.  - will place on Med-surg bed for obs - Empiric antimicrobial treatment with vancomycin. Rocephin - PRN Zofran for nausea, morphine and oxycodone for pain - Blood cultures x 2  - ESR and CRP - IVF: 1.0 L of LR, then 1.5 L of NS, followed by 75 cc/h - f/u US-venous doppler to r/o DVT  CKD (chronic kidney disease), stage IIIa: Stable.  Creatinine 1.06, BUN 25 -Follow-up with BMP  Type II diabetes mellitus with renal manifestations ([place]): Last A1c 8.4 on 11/18/2019, poorly controlled.  Patient is taking Metformin and Amaryl at home -SSI  Hyperlipidemia -Zocor  Pulmonary fibrosis and asthma: Stable -Bronchodilators, as needed Mucinex  HTN:  -hold [place] since pt is at high risk of developing hypotension secondary to sepsis -As needed hydralazine     DVT ppx:  SQ Lovenox Code Status: Full code Family Communication: not done, no family member is at bed side.  Disposition Plan:  Anticipate discharge back to previous home environment Consults called:  none Admission status: Med-surg bed for obs   Date of Service 01/02/2020    [facility]   If 7PM-7AM, please contact night-coverage [email or web address] 01/02/2020, 5:21 PM

## 2020-01-02 NOTE — ED Provider Notes (Signed)
[facility] Emergency Department Provider Note   ____________________________________________   First MD Initiated Contact with Patient 01/02/20 1057     (approximate)  I have reviewed the triage vital signs and the nursing notes.   HISTORY  Chief Complaint Arm Pain    HPI [name] [name] is a 69 y.o. female patient complain of right forearm pain for several weeks.  Patient states 2 weeks ago at the orthopedic clinic and was given tapered steroids and antibiotics.  Patient states complaint resolved return back after the medicine was finished.  No provoking incident for complaint.  Patient rates pain as a 10/10.  Patient described pain is "achy".  Review of history showed patient was being treated for unknown cellulitis by her PCP 2 weeks ago.  Patient states she took Keflex and Medrol Dosepak.      Past Medical History:  Diagnosis Date  . Anemia   . Asthma   . Chronic airway obstruction ([place])   . Diabetes mellitus without complication ([place])   . GERD (gastroesophageal reflux disease)   . History of adenomatous polyp of colon   . History of bone density study   . Hyperlipidemia   . Hypertension   . Idiopathic peripheral neuropathy   . Morbid obesity ([place])   . Osteoporosis   . Plantar fascial fibromatosis   . Pulmonary fibrosis ([place])   . Sleep apnea    Hypersomnia with sleep apnea  . TIA (transient ischemic attack)     Patient Active Problem List   Diagnosis Date Noted  . Anemia due to stage 3b chronic kidney disease 12/22/2019  . Fall 11/18/2019  . Closed rib fracture 11/18/2019  . Pulmonary fibrosis ([place])   . Asthma exacerbation   . Acute renal failure superimposed on stage 3a chronic kidney disease ([place])   . Chronic pain syndrome 09/15/2018  . AVD (aortic valve disease) 10/07/2017  . Benign essential hypertension 06/17/2017  . Nonrheumatic aortic valve insufficiency 06/17/2017  . CKD (chronic kidney disease) stage 3, GFR 30-59 ml/min  05/20/2017  . Wound dehiscence 11/05/2016  . Lumbar stenosis 10/15/2016  . Renal impairment 10/05/2016  . Right lumbar radiculopathy 10/05/2016  . Complete tear of right rotator cuff 11/28/2015  . Rotator cuff arthropathy, right 11/28/2015  . Low vitamin D level 02/28/2015  . Gastroesophageal reflux disease without esophagitis 12/14/2014  . Morbid obesity with BMI of 40.0-44.9, adult ([place]) 10/06/2014  . Aortic stenosis 09/14/2014  . DOE (dyspnea on exertion) 09/08/2014  . History of adenomatous polyp of colon 09/08/2014  . DM2 (diabetes mellitus, type 2) ([place]) 03/22/2014  . Hyperlipidemia 03/22/2014    Past Surgical History:  Procedure Laterality Date  . ABDOMINAL HYSTERECTOMY    . BACK SURGERY    . BLADDER SUSPENSION    . CESAREAN SECTION    . COLONOSCOPY    . DILATION AND CURETTAGE OF UTERUS    . ESOPHAGOGASTRODUODENOSCOPY    . LUMBAR LAMINECTOMY/DECOMPRESSION MICRODISCECTOMY N/A 10/15/2016   Procedure: L3-L5 Laminectomy ;  Surgeon: Deetta Perla, MD;  Location: [facility] ORS;  Service: Neurosurgery;  Laterality: N/A;  . LUMBAR WOUND DEBRIDEMENT N/A 11/05/2016   Procedure: LUMBAR WOUND DEBRIDEMENT;  Surgeon: Deetta Perla, MD;  Location: [facility] ORS;  Service: Neurosurgery;  Laterality: N/A;  . TUBAL LIGATION      Prior to Admission medications   Medication Sig Start Date End Date Taking? Authorizing Provider  albuterol (VENTOLIN HFA) 108 (90 Base) MCG/ACT inhaler Inhale 2 puffs into the lungs every 6 (six)  hours as needed for wheezing or shortness of breath. 11/24/19  Yes Nicole Kindred A, DO  allopurinol (ZYLOPRIM) 100 MG tablet Take 100 mg by mouth daily.   Yes [provider]  budesonide-formoterol (SYMBICORT) 160-4.5 MCG/ACT inhaler Inhale 2 puffs into the lungs 2 (two) times daily.   Yes [provider]  cyclobenzaprine (FLEXERIL) 5 MG tablet Take 5 mg by mouth 2 (two) times daily as needed for muscle spasms.   Yes [provider]  diclofenac Sodium  (VOLTAREN) 1 % GEL Apply 2 g topically 4 (four) times daily as needed (rib cage pain). 11/24/19  Yes Nicole Kindred A, DO  DULoxetine (CYMBALTA) 30 MG capsule Take 30 mg by mouth daily. 07/17/19  Yes [provider]  esomeprazole (NEXIUM) 40 MG capsule Take 40 mg by mouth daily.    Yes [provider]  ferrous sulfate 325 (65 FE) MG tablet Take 1 tablet (325 mg total) by mouth 2 (two) times daily with a meal. 11/24/19  Yes Nicole Kindred A, DO  glimepiride (AMARYL) 2 MG tablet Take 2 mg by mouth every morning. 07/23/19  Yes [provider]  losartan (COZAAR) 50 MG tablet Take 50 mg by mouth daily. 07/28/19  Yes [provider]  metFORMIN (GLUCOPHAGE) 500 MG tablet Take 500 mg by mouth 2 (two) times daily with a meal.   Yes [provider]  oxyCODONE (ROXICODONE) 5 MG immediate release tablet Take 1 tablet (5 mg total) by mouth every 4 (four) hours as needed for severe pain. Patient taking differently: Take 5 mg by mouth 2 (two) times daily as needed for severe pain.  11/14/19  Yes Earleen Newport, MD  predniSONE (DELTASONE) 2.5 MG tablet Take 2.5 mg by mouth daily.    Yes [provider]  simvastatin (ZOCOR) 20 MG tablet Take 20 mg by mouth daily.   Yes [provider]    Allergies Acetaminophen-codeine, Codeine, Furosemide, Penicillin v potassium, Quinine, Tramadol, and Tylenol [acetaminophen]  Family History  Problem Relation Age of Onset  . Stroke Mother   . Heart attack Mother   . Breast cancer Sister 30  . Stroke Sister   . Heart attack Sister   . Breast cancer Cousin        maternal side 57's    Social History Social History   Tobacco Use  . Smoking status: Former Research scientist (life sciences)  . Smokeless tobacco: Never Used  Substance Use Topics  . Alcohol use: No  . Drug use: No    Review of Systems Constitutional: No fever/chills Eyes: No visual changes. ENT: No sore throat. Cardiovascular: Denies chest pain. Respiratory:  Denies shortness of breath. Gastrointestinal: No abdominal pain.  No nausea, no vomiting.  No diarrhea.  No constipation. Genitourinary: Negative for dysuria. Musculoskeletal: Negative for back pain. Skin: Negative for rash. Neurological: Negative for headaches, focal weakness or numbness. Endocrine:  Diabetes, hyperlipidemia, and hypertension. Hematological/Lymphatic:  Allergic/Immunilogical: Acetaminophen with codeine Lasix.  Penicillin, potassium, tramadol, and Tylenol. ____________________________________________   PHYSICAL EXAM:  VITAL SIGNS: ED Triage Vitals  Enc Vitals Group     BP 01/02/20 1029 126/87     Pulse Rate 01/02/20 1029 (!) 106     Resp 01/02/20 1029 16     Temp 01/02/20 1029 98.7 F (37.1 C)     Temp Source 01/02/20 1029 Oral     SpO2 01/02/20 1029 100 %     Weight 01/02/20 1030 213 lb (96.6 kg)     Height 01/02/20 1030 4'  11" (1.499 m)     Head Circumference --      Peak Flow --      Pain Score 01/02/20 1030 10     Pain Loc --      Pain Edu? --      Excl. in [place]? --    Constitutional: Alert and oriented. Well appearing and in no acute distress.  Morbid obesity.  Afebrile. Cardiovascular: Normal rate, regular rhythm. Grossly normal heart sounds.  Good peripheral circulation. Respiratory: Normal respiratory effort.  No retractions. Lungs CTAB. Musculoskeletal: No obvious deformity to the right forearm.  Distal radial edema.   Neurologic:  Normal speech and language. No gross focal neurologic deficits are appreciated. No gait instability. Skin:  Skin is warm, dry and intact. No rash noted.  No erythema. Psychiatric: Mood and affect are normal. Speech and behavior are normal.  ____________________________________________   LABS (all labs ordered are listed, but only abnormal results are displayed)  Labs Reviewed  CBC WITH DIFFERENTIAL/PLATELET - Abnormal; Notable for the following components:      Result Value   WBC 26.0 (*)    RDW 16.9 (*)    Neutro  Abs 21.0 (*)    Monocytes Absolute 1.9 (*)    Abs Immature Granulocytes 0.45 (*)    All other components within normal limits  BASIC METABOLIC PANEL - Abnormal; Notable for the following components:   CO2 20 (*)    Glucose, Bld 242 (*)    BUN 25 (*)    Creatinine, Ser 1.06 (*)    Calcium 8.8 (*)    GFR calc non Af Amer 55 (*)    All other components within normal limits  SEDIMENTATION RATE - Abnormal; Notable for the following components:   Sed Rate 43 (*)    All other components within normal limits  LACTIC ACID, PLASMA - Abnormal; Notable for the following components:   Lactic Acid, Venous 2.5 (*)    All other components within normal limits  CULTURE, BLOOD (ROUTINE X 2)  CULTURE, BLOOD (ROUTINE X 2)  SARS CORONAVIRUS 2 (TAT 6-24 HRS)  URIC ACID  LACTIC ACID, PLASMA  URINALYSIS, COMPLETE (UACMP) WITH MICROSCOPIC   ____________________________________________  EKG   ____________________________________________  RADIOLOGY  ED MD interpretation:    Official radiology report(s): DG Forearm Right  Result Date: 01/02/2020 CLINICAL DATA:  Pain without trauma x2 weeks EXAM: RIGHT FOREARM - 2 VIEW COMPARISON:  None. FINDINGS: There is no evidence of fracture or other focal bone lesions. Soft tissues are unremarkable. IMPRESSION: Negative. Electronically Signed   By: Lucrezia Europe M.D.   On: 01/02/2020 11:40    ____________________________________________   PROCEDURES  Procedure(s) performed (including Critical Care):  Procedures   ____________________________________________   INITIAL IMPRESSION / ASSESSMENT AND PLAN / ED COURSE  As part of my medical decision making, I reviewed the following data within the [place]     Patient presents with continue right forearm pain for 2 weeks.  Patient completed a course of antibiotics for 7 days on her last visit PCP on 12/18/2019.  Patient denies fever associated complaint.  Patient has loss sensation or  function of the right forearm.  Discussed lab work with patient consistent of elevated white blood count and increased lactic acid.  These findings consistent with sepsis.  Patient will be transferred over to the major part of the ED for reevaluation, treatment/admission.    [name] [name] was evaluated in Emergency Department on 01/02/2020 for the symptoms  described in the history of present illness. She was evaluated in the context of the global COVID-19 pandemic, which necessitated consideration that the patient might be at risk for infection with the SARS-CoV-2 virus that causes COVID-19. Institutional protocols and algorithms that pertain to the evaluation of patients at risk for COVID-19 are in a state of rapid change based on information released by regulatory bodies including the CDC and federal and state organizations. These policies and algorithms were followed during the patient's care in the ED.       ____________________________________________   FINAL CLINICAL IMPRESSION(S) / ED DIAGNOSES  Final diagnoses:  Pain, joint, forearm, right  Bacterial infection     ED Discharge Orders    None       Note:  This document was prepared using Dragon voice recognition software and may include unintentional dictation errors.    Justyn, [place], PA-C 01/02/20 1332    Lavonia Drafts, MD 01/02/20 1346    Blake Divine, MD 01/03/20 [phone]

## 2020-01-02 NOTE — Consult Note (Signed)
Pharmacy Antibiotic Note  [name] [name] is a 69 y.o. female admitted on 01/02/2020 with Cellulitis .  Pharmacy has been consulted for Vancomycin dosing.  Patient was taking keflex prior to admission.   Vancomycin 1000mg  IV x 2 doses and ceftriaxone ordered in ED.   Plan: Start Vancomycin 750 mg IV Q 24 hrs. Goal AUC 400-550. Expected AUC: 457 SCr used: 1.06  IBW, Vd: 0.5   Pharmacy will continue to monitor renal function and adjust dose as needed.  Height: 4\' 11"  (149.9 cm) Weight: 213 lb (96.6 kg) IBW/kg (Calculated) : 43.2  Temp (24hrs), Avg:98.7 F (37.1 C), Min:98.7 F (37.1 C), Max:98.7 F (37.1 C)  Recent Labs  Lab 01/02/20 1112 01/02/20 1205 01/02/20 1411  WBC 26.0*  --   --   CREATININE 1.06*  --   --   LATICACIDVEN  --  2.5* 3.4*    Estimated Creatinine Clearance: 53.2 mL/min (A) (by C-G formula based on SCr of 1.06 mg/dL (H)).    Allergies  Allergen Reactions  . Acetaminophen-Codeine Other (See Comments)    Other reaction(s): Unknown   . Codeine Nausea And Vomiting  . Furosemide Other (See Comments)  . Penicillin V Potassium     Other reaction(s): Unknown  . Quinine     Other reaction(s): Unknown  . Tramadol     Other reaction(s): Unknown  . Tylenol [Acetaminophen]     Tylenol-Codeine: Dizziness    Antimicrobials this admission: 3/27 vancomycin >>  3/27 Ceftriaxone >>   Microbiology results: 3/27 BCx: pending  Thank you for allowing pharmacy to be a part of this patient's care.  Pernell Dupre, PharmD, BCPS Clinical Pharmacist 01/02/2020 3:16 PM

## 2020-01-02 NOTE — ED Notes (Signed)
Pt states having right hand/arm pain that is painful upon palpation. Pt denies falling on it. Weak grip strength on assessment. Hand warm to touch.

## 2020-01-02 NOTE — Consult Note (Signed)
PHARMACY -  BRIEF ANTIBIOTIC NOTE   Pharmacy has received consult(s) for cellulitis from an ED provider.  The patient's profile has been reviewed for ht/wt/allergies/indication/available labs.    One time order(s) placed for vancomycin and ceftriaxone  Further antibiotics/pharmacy consults should be ordered by admitting physician if indicated.                       Thank you, Oswald Hillock 01/02/2020  1:23 PM

## 2020-01-03 ENCOUNTER — Inpatient Hospital Stay: Payer: Medicare Other

## 2020-01-03 DIAGNOSIS — N183 Chronic kidney disease, stage 3 unspecified: Secondary | ICD-10-CM | POA: Diagnosis present

## 2020-01-03 DIAGNOSIS — Z8249 Family history of ischemic heart disease and other diseases of the circulatory system: Secondary | ICD-10-CM | POA: Diagnosis not present

## 2020-01-03 DIAGNOSIS — Z86010 Personal history of colonic polyps: Secondary | ICD-10-CM | POA: Diagnosis not present

## 2020-01-03 DIAGNOSIS — Z87891 Personal history of nicotine dependence: Secondary | ICD-10-CM | POA: Diagnosis not present

## 2020-01-03 DIAGNOSIS — Z8673 Personal history of transient ischemic attack (TIA), and cerebral infarction without residual deficits: Secondary | ICD-10-CM | POA: Diagnosis not present

## 2020-01-03 DIAGNOSIS — L0390 Cellulitis, unspecified: Secondary | ICD-10-CM | POA: Diagnosis present

## 2020-01-03 DIAGNOSIS — Z20822 Contact with and (suspected) exposure to covid-19: Secondary | ICD-10-CM | POA: Diagnosis present

## 2020-01-03 DIAGNOSIS — M810 Age-related osteoporosis without current pathological fracture: Secondary | ICD-10-CM | POA: Diagnosis present

## 2020-01-03 DIAGNOSIS — D509 Iron deficiency anemia, unspecified: Secondary | ICD-10-CM | POA: Diagnosis present

## 2020-01-03 DIAGNOSIS — E1122 Type 2 diabetes mellitus with diabetic chronic kidney disease: Secondary | ICD-10-CM | POA: Diagnosis present

## 2020-01-03 DIAGNOSIS — L03113 Cellulitis of right upper limb: Secondary | ICD-10-CM | POA: Diagnosis present

## 2020-01-03 DIAGNOSIS — K219 Gastro-esophageal reflux disease without esophagitis: Secondary | ICD-10-CM | POA: Diagnosis present

## 2020-01-03 DIAGNOSIS — Z90710 Acquired absence of both cervix and uterus: Secondary | ICD-10-CM | POA: Diagnosis not present

## 2020-01-03 DIAGNOSIS — M79631 Pain in right forearm: Secondary | ICD-10-CM | POA: Diagnosis present

## 2020-01-03 DIAGNOSIS — J449 Chronic obstructive pulmonary disease, unspecified: Secondary | ICD-10-CM | POA: Diagnosis present

## 2020-01-03 DIAGNOSIS — I129 Hypertensive chronic kidney disease with stage 1 through stage 4 chronic kidney disease, or unspecified chronic kidney disease: Secondary | ICD-10-CM | POA: Diagnosis present

## 2020-01-03 DIAGNOSIS — Z803 Family history of malignant neoplasm of breast: Secondary | ICD-10-CM | POA: Diagnosis not present

## 2020-01-03 DIAGNOSIS — F329 Major depressive disorder, single episode, unspecified: Secondary | ICD-10-CM | POA: Diagnosis present

## 2020-01-03 DIAGNOSIS — Z823 Family history of stroke: Secondary | ICD-10-CM | POA: Diagnosis not present

## 2020-01-03 DIAGNOSIS — A419 Sepsis, unspecified organism: Secondary | ICD-10-CM | POA: Diagnosis present

## 2020-01-03 DIAGNOSIS — E785 Hyperlipidemia, unspecified: Secondary | ICD-10-CM | POA: Diagnosis present

## 2020-01-03 DIAGNOSIS — J8410 Pulmonary fibrosis, unspecified: Secondary | ICD-10-CM | POA: Diagnosis present

## 2020-01-03 LAB — BASIC METABOLIC PANEL
Anion gap: 6 (ref 5–15)
BUN: 27 mg/dL — ABNORMAL HIGH (ref 8–23)
CO2: 21 mmol/L — ABNORMAL LOW (ref 22–32)
Calcium: 7.8 mg/dL — ABNORMAL LOW (ref 8.9–10.3)
Chloride: 109 mmol/L (ref 98–111)
Creatinine, Ser: 0.99 mg/dL (ref 0.44–1.00)
GFR calc Af Amer: >60 mL/min (ref >60)
GFR calc non Af Amer: 59 mL/min — ABNORMAL LOW (ref >60)
Glucose, Bld: 167 mg/dL — ABNORMAL HIGH (ref 70–99)
Potassium: 4.1 mmol/L (ref 3.5–5.1)
Sodium: 136 mmol/L (ref 135–145)

## 2020-01-03 LAB — CBC
HCT: 33.2 % — ABNORMAL LOW (ref 36.0–46.0)
Hemoglobin: 10.4 g/dL — ABNORMAL LOW (ref 12.0–15.0)
MCH: 28.3 pg (ref 26.0–34.0)
MCHC: 31.3 g/dL (ref 30.0–36.0)
MCV: 90.5 fL (ref 80.0–100.0)
Platelets: 229 10*3/uL (ref 150–400)
RBC: 3.67 MIL/uL — ABNORMAL LOW (ref 3.87–5.11)
RDW: 16.7 % — ABNORMAL HIGH (ref 11.5–15.5)
WBC: 23.1 10*3/uL — ABNORMAL HIGH (ref 4.0–10.5)
nRBC: 0 % (ref 0.0–0.2)

## 2020-01-03 LAB — GLUCOSE, CAPILLARY
Glucose-Capillary: 136 mg/dL — ABNORMAL HIGH (ref 70–99)
Glucose-Capillary: 226 mg/dL — ABNORMAL HIGH (ref 70–99)
Glucose-Capillary: 210 mg/dL — ABNORMAL HIGH (ref 70–99)
Glucose-Capillary: 203 mg/dL — ABNORMAL HIGH (ref 70–99)

## 2020-01-03 LAB — LACTIC ACID, PLASMA: Lactic Acid, Venous: 1.9 mmol/L (ref 0.5–1.9)

## 2020-01-03 NOTE — Progress Notes (Signed)
Secure chat with Dr Reesa Chew states to hold on PICC placement until blood cultures are negative x 48 hours, will reorder at that time if still needed.  Spoke with Caryl Pina RN re conversation with Dr Reesa Chew.  Notified Casey Burkitt that midline is appropriate to use for blood draws as long as it provides blood return.

## 2020-01-03 NOTE — Progress Notes (Signed)
PROGRESS NOTE    [name] [name]  [id number] DOB: 10-13-1950 DOA: 01/02/2020 PCP: Tracie Harrier, MD   Brief Narrative:  [name] [name] is a 69 y.o. female with medical history significant of hypertension, hyperlipidemia, diabetes mellitus, COPD, asthma, TIA, GERD, depression, pulmonary fibrosis, iron deficiency anemia, CKD stage III, who presents with right forearm pain.  Patient states that she has been having right forearm pain and swelling for almost 3 weeks. Patient states 2 weeks ago at the orthopedic clinic and was given tapered steroids and antibiotics. Patient states she took Keflex and Medrol Dosepak. She states that her arm pain initially improved, but returned back after she finished taking medicine.  Admitted for cellulitis with failed outpatient therapy.  Subjective: Continue to experience right forearm pain and swelling.  Assessment & Plan:   Principal Problem:   Cellulitis of right arm Active Problems:   CKD (chronic kidney disease), stage IIIa   Type II diabetes mellitus with renal manifestations (HCC)   Hyperlipidemia   Pulmonary fibrosis (HCC)   Sepsis (HCC)   Asthma   HTN (hypertension)   Cellulitis  Sepsis due to cellulitis of right arm: Patient meets critical for sepsis with leukocytosis, tachycardia, elevated lactic acid.  Currently hemodynamically stable. Blood cultures pending.  Lactic acidosis resolved. Soft tissue ultrasound was obtained due to fluctuating edema of right distal forearm which was negative for any abscess.  Venous Doppler of right upper extremity obtained yesterday was negative for DVT. -Continue empiric antibiotics with vancomycin and Rocephin.  Leukocytosis.  Patient has chronic leukocytosis which worsened with infection.  She follow-up with hematology who were advising some recent work-up. -Continue to follow-up with hematology for further management.  CKD (chronic kidney disease), stage IIIa: Stable. -Continue to  monitor. -Avoid nephrotoxins.  Type II diabetes mellitus with renal manifestations ([place]): Last A1c 8.4 on 11/18/2019, poorly controlled.  Patient is taking Metformin and Amaryl at home -SSI  Hyperlipidemia -Zocor  Pulmonary fibrosis and asthma: Stable -Bronchodilators, as needed Mucinex.  Hypertension.  Blood pressure within goal. -Home dose of Cozaar was held due to concern of hypotension with sepsis. -Continue to hold-[name] be restarted as needed.  Incidental finding of cystic pancreatic lesion.  Patient has CT abdomen done during her prior admission in February 2021 secondary to fall resulted in multiple rib fractures.  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  MRI with pancreatic protocol was advised.  It was mentioned on her prior discharge summary.  No mention during her follow-up visit with her PCP or hematologist.  Patient was not aware of any findings.  Discussed with radiologist and they were recommending outpatient pancreatic protocol MRI for follow-up.  We [name] communicate with her oncologist and PCP on discharge for further follow-up and management.  Objective: Vitals:   01/02/20 1600 01/03/20 0006 01/03/20 0151 01/03/20 0823  BP: 118/75 99/69 120/64 (!) 137/58  Pulse: 87 (!) 105 96 81  Resp: 19 18  20   Temp:  98 F (36.7 C) 98.7 F (37.1 C) 98.2 F (36.8 C)  TempSrc:  Oral Oral Oral  SpO2: 100% 99% 99% 100%  Weight:      Height:        Intake/Output Summary (Last 24 hours) at 01/03/2020 1549 Last data filed at 01/03/2020 1500 Gross per 24 hour  Intake 1340 ml  Output --  Net 1340 ml   Filed Weights   01/02/20 1030  Weight: 96.6 kg    Examination:  General exam: Appears calm and  comfortable  Respiratory system: Clear to auscultation. Respiratory effort normal. Cardiovascular system: S1 & S2 heard, RRR. No JVD, murmurs, rubs, gallops or clicks. Gastrointestinal system: Soft, nontender, nondistended, bowel sounds  positive. Central nervous system: Alert and oriented. No focal neurological deficits.Symmetric 5 x 5 power. Extremities: Right distal forearm fluctuating edema involving right hand with mild erythema and hyperthermia, no cyanosis, pulses intact and symmetrical. Psychiatry: Judgement and insight appear normal. Mood & affect appropriate.    DVT prophylaxis: Lovenox Code Status: Full Family Communication: Son was updated at bedside Disposition Plan: Pending improvement.  Patient [name] go back home.  Consultants:   None  Procedures:  Antimicrobials:  Rocephin Vancomycin  Data Reviewed: I have personally reviewed following labs and imaging studies  CBC: Recent Labs  Lab 01/02/20 1112 01/03/20 0631  WBC 26.0* 23.1*  NEUTROABS 21.0*  --   HGB 12.2 10.4*  HCT 39.0 33.2*  MCV 89.7 90.5  PLT 273 [id number]   Basic Metabolic Panel: Recent Labs  Lab 01/02/20 1112 01/03/20 0631  NA 136 136  K 4.6 4.1  CL 104 109  CO2 20* 21*  GLUCOSE 242* 167*  BUN 25* 27*  CREATININE 1.06* 0.99  CALCIUM 8.8* 7.8*   GFR: Estimated Creatinine Clearance: 57 mL/min (by C-G formula based on SCr of 0.99 mg/dL). Liver Function Tests: No results for input(s): AST, ALT, ALKPHOS, BILITOT, PROT, ALBUMIN in the last 168 hours. No results for input(s): LIPASE, AMYLASE in the last 168 hours. No results for input(s): AMMONIA in the last 168 hours. Coagulation Profile: No results for input(s): INR, PROTIME in the last 168 hours. Cardiac Enzymes: No results for input(s): CKTOTAL, CKMB, CKMBINDEX, TROPONINI in the last 168 hours. BNP (last 3 results) No results for input(s): PROBNP in the last 8760 hours. HbA1C: No results for input(s): HGBA1C in the last 72 hours. CBG: Recent Labs  Lab 01/02/20 1656 01/02/20 2151 01/03/20 0825 01/03/20 1120  GLUCAP 136* 308* 136* 226*   Lipid Profile: No results for input(s): CHOL, HDL, LDLCALC, TRIG, CHOLHDL, LDLDIRECT in the last 72 hours. Thyroid Function  Tests: No results for input(s): TSH, T4TOTAL, FREET4, T3FREE, THYROIDAB in the last 72 hours. Anemia Panel: No results for input(s): VITAMINB12, FOLATE, FERRITIN, TIBC, IRON, RETICCTPCT in the last 72 hours. Sepsis Labs: Recent Labs  Lab 01/02/20 1112 01/02/20 1205 01/02/20 1411 01/02/20 1602 01/02/20 1834 01/03/20 1006  PROCALCITON <0.10  --   --   --   --   --   LATICACIDVEN  --    < > 3.4* 2.6* 2.5* 1.9   < > = values in this interval not displayed.    Recent Results (from the past 240 hour(s))  Blood culture (routine x 2)     Status: None (Preliminary result)   Collection Time: 01/02/20 12:05 PM   Specimen: BLOOD  Result Value Ref Range Status   Specimen Description BLOOD BLOOD LEFT ARM  Final   Special Requests   Final    BOTTLES DRAWN AEROBIC AND ANAEROBIC Blood Culture results may not be optimal due to an inadequate volume of blood received in culture bottles   Culture   Final    NO GROWTH < 24 HOURS Performed at [facility], [address] [place], [place] [address]    Report Status PENDING  Incomplete  Blood culture (routine x 2)     Status: None (Preliminary result)   Collection Time: 01/02/20 12:05 PM   Specimen: BLOOD  Result Value Ref Range Status  Specimen Description BLOOD BLOOD LEFT HAND  Final   Special Requests   Final    BOTTLES DRAWN AEROBIC AND ANAEROBIC Blood Culture results may not be optimal due to an inadequate volume of blood received in culture bottles   Culture   Final    NO GROWTH < 24 HOURS Performed at [facility], [address] [place], [place] [address]    Report Status PENDING  Incomplete  SARS CORONAVIRUS 2 (TAT 6-24 HRS) Nasopharyngeal Nasopharyngeal Swab     Status: None   Collection Time: 01/02/20  1:36 PM   Specimen: Nasopharyngeal Swab  Result Value Ref Range Status   SARS Coronavirus 2 NEGATIVE NEGATIVE Final    Comment: (NOTE) SARS-CoV-2 target nucleic acids are NOT DETECTED. The SARS-CoV-2 RNA is  generally detectable in upper and lower respiratory specimens during the acute phase of infection. Negative results do not preclude SARS-CoV-2 infection, do not rule out co-infections with other pathogens, and should not be used as the sole basis for treatment or other patient management decisions. Negative results must be combined with clinical observations, patient history, and epidemiological information. The expected result is Negative. Fact Sheet for Patients: SugarRoll.be Fact Sheet for Healthcare Providers: https://www.woods-mathews.com/ This test is not yet approved or cleared by the [place] FDA and  has been authorized for detection and/or diagnosis of SARS-CoV-2 by FDA under an Emergency Use Authorization (EUA). This EUA [name] remain  in effect (meaning this test can be used) for the duration of the COVID-19 declaration under Section 56 4(b)(1) of the Act, 21 U.S.C. section 360bbb-3(b)(1), unless the authorization is terminated or revoked sooner. Performed at [facility], [place] [address] [place], [place] [address]      Radiology Studies: DG Forearm Right  Result Date: 01/02/2020 CLINICAL DATA:  Pain without trauma x2 weeks EXAM: RIGHT FOREARM - 2 VIEW COMPARISON:  None. FINDINGS: There is no evidence of fracture or other focal bone lesions. Soft tissues are unremarkable. IMPRESSION: Negative. Electronically Signed   By: Lucrezia Europe M.D.   On: 01/02/2020 11:40   US Venous Img Upper Uni Right(DVT)  Result Date: 01/02/2020 CLINICAL DATA:  Cellulitis of the right upper extremity EXAM: RIGHT UPPER EXTREMITY VENOUS DOPPLER ULTRASOUND TECHNIQUE: Gray-scale sonography with graded compression, as well as color Doppler and duplex ultrasound were performed to evaluate the upper extremity deep venous system from the level of the subclavian vein and including the jugular, axillary, basilic, radial, ulnar and upper cephalic vein. Spectral  Doppler was utilized to evaluate flow at rest and with distal augmentation maneuvers. COMPARISON:  None. FINDINGS: Contralateral Subclavian Vein: Respiratory phasicity is normal and symmetric with the symptomatic side. No evidence of thrombus. Normal compressibility. Internal Jugular Vein: No evidence of thrombus. Normal compressibility, respiratory phasicity and response to augmentation. Subclavian Vein: No evidence of thrombus. Normal compressibility, respiratory phasicity and response to augmentation. Axillary Vein: No evidence of thrombus. Normal compressibility, respiratory phasicity and response to augmentation. Cephalic Vein: No evidence of thrombus. Normal compressibility, respiratory phasicity and response to augmentation. Basilic Vein: No evidence of thrombus. Normal compressibility, respiratory phasicity and response to augmentation. Brachial Veins: No evidence of thrombus. Normal compressibility, respiratory phasicity and response to augmentation. Radial Veins: No evidence of thrombus. Normal compressibility, respiratory phasicity and response to augmentation. Ulnar Veins: No evidence of thrombus. Normal compressibility, respiratory phasicity and response to augmentation. Venous Reflux:  None visualized. Other Findings:  None visualized. IMPRESSION: No evidence of DVT within the right upper extremity. Electronically Signed  By: Constance Holster M.D.   On: 01/02/2020 19:20   [place] RT UPPER EXTREM LTD SOFT TISSUE NON VASCULAR  Result Date: 01/03/2020 CLINICAL DATA:  Swelling, redness and tenderness of the dorsal aspect of the right hand and wrist. EXAM: ULTRASOUND RIGHT UPPER EXTREMITY LIMITED TECHNIQUE: Ultrasound examination of the upper extremity soft tissues was performed in the area of clinical concern. COMPARISON:  None. FINDINGS: There is subcutaneous edema on the dorsum of the wrist and hand but there is no definable abscess or mass. No appreciable tenosynovitis. IMPRESSION: Subcutaneous edema  in the dorsum of the hand and wrist without a definable abscess. The finding is consistent with cellulitis. Electronically Signed   By: Lorriane Shire M.D.   On: 01/03/2020 14:15    Scheduled Meds: . allopurinol  100 mg Oral Daily  . dextromethorphan-guaiFENesin  1 tablet Oral BID  . DULoxetine  30 mg Oral Daily  . enoxaparin (LOVENOX) injection  40 mg Subcutaneous Q24H  . ferrous sulfate  325 mg Oral BID WC  . insulin aspart  0-5 Units Subcutaneous QHS  . insulin aspart  0-9 Units Subcutaneous TID WC  . mometasone-formoterol  2 puff Inhalation BID  . pantoprazole  40 mg Oral Daily  . simvastatin  20 mg Oral Daily  . sodium chloride flush  10-40 mL Intracatheter Q12H   Continuous Infusions: . sodium chloride 75 mL/hr at 01/02/20 1753  . cefTRIAXone (ROCEPHIN)  IV Stopped (01/03/20 1328)  . vancomycin       LOS: 0 days   Time spent: 40 minutes  Lorella Nimrod, MD [facility]  If 7PM-7AM, please contact night-coverage [email or web address]  01/03/2020, 3:49 PM   This record has been created using Systems analyst. Errors have been sought and corrected,but may not always be located. Such creation errors do not reflect on the standard of care.

## 2020-01-04 LAB — BASIC METABOLIC PANEL
Anion gap: 10 (ref 5–15)
Anion gap: 9 (ref 5–15)
BUN: 26 mg/dL — ABNORMAL HIGH (ref 8–23)
BUN: 23 mg/dL (ref 8–23)
CO2: 16 mmol/L — ABNORMAL LOW (ref 22–32)
CO2: 18 mmol/L — ABNORMAL LOW (ref 22–32)
Calcium: 8.1 mg/dL — ABNORMAL LOW (ref 8.9–10.3)
Calcium: 8 mg/dL — ABNORMAL LOW (ref 8.9–10.3)
Chloride: 109 mmol/L (ref 98–111)
Chloride: 105 mmol/L (ref 98–111)
Creatinine, Ser: 1.07 mg/dL — ABNORMAL HIGH (ref 0.44–1.00)
Creatinine, Ser: 0.96 mg/dL (ref 0.44–1.00)
GFR calc Af Amer: >60 mL/min (ref >60)
GFR calc Af Amer: >60 mL/min (ref >60)
GFR calc non Af Amer: 54 mL/min — ABNORMAL LOW (ref >60)
GFR calc non Af Amer: >60 mL/min (ref >60)
Glucose, Bld: 191 mg/dL — ABNORMAL HIGH (ref 70–99)
Glucose, Bld: 262 mg/dL — ABNORMAL HIGH (ref 70–99)
Potassium: 4.1 mmol/L (ref 3.5–5.1)
Potassium: 4.2 mmol/L (ref 3.5–5.1)
Sodium: 135 mmol/L (ref 135–145)
Sodium: 132 mmol/L — ABNORMAL LOW (ref 135–145)

## 2020-01-04 LAB — CBC
HCT: 32.1 % — ABNORMAL LOW (ref 36.0–46.0)
Hemoglobin: 10 g/dL — ABNORMAL LOW (ref 12.0–15.0)
MCH: 28.6 pg (ref 26.0–34.0)
MCHC: 31.2 g/dL (ref 30.0–36.0)
MCV: 91.7 fL (ref 80.0–100.0)
Platelets: 223 10*3/uL (ref 150–400)
RBC: 3.5 MIL/uL — ABNORMAL LOW (ref 3.87–5.11)
RDW: 16.7 % — ABNORMAL HIGH (ref 11.5–15.5)
WBC: 18.3 10*3/uL — ABNORMAL HIGH (ref 4.0–10.5)
nRBC: 0 % (ref 0.0–0.2)

## 2020-01-04 LAB — GLUCOSE, CAPILLARY
Glucose-Capillary: 157 mg/dL — ABNORMAL HIGH (ref 70–99)
Glucose-Capillary: 149 mg/dL — ABNORMAL HIGH (ref 70–99)
Glucose-Capillary: 193 mg/dL — ABNORMAL HIGH (ref 70–99)
Glucose-Capillary: 180 mg/dL — ABNORMAL HIGH (ref 70–99)

## 2020-01-04 MED ORDER — LACTATED RINGERS IV SOLN: INTRAVENOUS | Status: AC

## 2020-01-04 NOTE — Progress Notes (Signed)
PROGRESS NOTE    RENECIA SHRODE  [id number] DOB: 10-Dec-1950 DOA: 01/02/2020 PCP: Tracie Harrier, MD   Brief Narrative:  [name] [name] is a 69 y.o. female with medical history significant of hypertension, hyperlipidemia, diabetes mellitus, COPD, asthma, TIA, GERD, depression, pulmonary fibrosis, iron deficiency anemia, CKD stage III, who presents with right forearm pain.  Patient states that she has been having right forearm pain and swelling for almost 3 weeks. Patient states 2 weeks ago at the orthopedic clinic and was given tapered steroids and antibiotics. Patient states she took Keflex and Medrol Dosepak. She states that her arm pain initially improved, but returned back after she finished taking medicine.  Admitted for cellulitis with failed outpatient therapy.  Subjective: Continue to complain about right forearm pain and swelling.  Assessment & Plan:   Principal Problem:   Cellulitis of right arm Active Problems:   CKD (chronic kidney disease), stage IIIa   Type II diabetes mellitus with renal manifestations (HCC)   Hyperlipidemia   Pulmonary fibrosis (HCC)   Sepsis (HCC)   Asthma   HTN (hypertension)   Cellulitis  Sepsis due to cellulitis of right arm: Sepsis ruled out.  Patient meets critical for sepsis with leukocytosis, tachycardia, elevated lactic acid.  Currently hemodynamically stable. Blood cultures remain negative.  Lactic acidosis resolved. Soft tissue ultrasound was obtained due to fluctuating edema of right distal forearm which was negative for any abscess.  Venous Doppler of right upper extremity obtained yesterday was negative for DVT. -Continue empiric antibiotics with vancomycin and Rocephin. -Clinically seems improving, continues to have significant edema.  Leukocytosis.  Patient has chronic leukocytosis which worsened with infection. Started improving.  She follow-up with hematology who were advising some recent work-up. -Continue to follow-up  with hematology for further management.  Metabolic acidosis.  Bicarb low at 16 today.  No anion gap.  Patient has CKD and infection.  Lactic acidosis resolved. -Give her more fluid-improvement to bicarb to 18. -Give her another letter in 10 hours. -Monitor BMP.  CKD (chronic kidney disease), stage IIIa: Stable. -Continue to monitor. -Avoid nephrotoxins.  Type II diabetes mellitus with renal manifestations ([place]): Last A1c 8.4 on 11/18/2019, poorly controlled.  Patient is taking Metformin and Amaryl at home -SSI  Hyperlipidemia -Zocor  Pulmonary fibrosis and asthma: Stable -Bronchodilators, as needed Mucinex.  Hypertension.  Blood pressure within goal. -Home dose of Cozaar was held due to concern of hypotension with sepsis. -Continue to hold-will be restarted as needed.  Incidental finding of cystic pancreatic lesion.  Patient has CT abdomen done during her prior admission in February 2021 secondary to fall resulted in multiple rib fractures.  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  MRI with pancreatic protocol was advised.  It was mentioned on her prior discharge summary.  No mention during her follow-up visit with her PCP or hematologist.  Patient was not aware of any findings.  Discussed with radiologist and they were recommending outpatient pancreatic protocol MRI for follow-up.  We will communicate with her oncologist and PCP on discharge for further follow-up and management.  Objective: Vitals:   01/03/20 0823 01/03/20 1829 01/04/20 0023 01/04/20 0740  BP: (!) 137/58 128/66 123/75 122/65  Pulse: 81 (!) 112 92 (!) 104  Resp: 20 19 20 17   Temp: 98.2 F (36.8 C) 98.1 F (36.7 C) 98.2 F (36.8 C) 98.1 F (36.7 C)  TempSrc: Oral Oral Oral Oral  SpO2: 100% 100% 100% 100%  Weight:  Height:        Intake/Output Summary (Last 24 hours) at 01/04/2020 1444 Last data filed at 01/04/2020 0501 Gross per 24 hour  Intake 2533.73 ml   Output --  Net 2533.73 ml   Filed Weights   01/02/20 1030  Weight: 96.6 kg    Examination:  General exam: Appears calm and comfortable  Respiratory system: Clear to auscultation. Respiratory effort normal. Cardiovascular system: S1 & S2 heard, RRR. No JVD, murmurs, rubs, gallops or clicks. Gastrointestinal system: Soft, nontender, nondistended, bowel sounds positive. Central nervous system: Alert and oriented. No focal neurological deficits.Symmetric 5 x 5 power. Extremities: Right distal forearm  edema involving right hand with mild erythema and hyperthermia, no cyanosis, pulses intact and symmetrical. Psychiatry: Judgement and insight appear normal. Mood & affect appropriate.    DVT prophylaxis: Lovenox Code Status: Full Family Communication: Son was updated on phone. Disposition Plan: Pending improvement.  Patient will go back home.  Consultants:   None  Procedures:  Antimicrobials:  Rocephin Vancomycin  Data Reviewed: I have personally reviewed following labs and imaging studies  CBC: Recent Labs  Lab 01/02/20 1112 01/03/20 0631 01/04/20 0541  WBC 26.0* 23.1* 18.3*  NEUTROABS 21.0*  --   --   HGB 12.2 10.4* 10.0*  HCT 39.0 33.2* 32.1*  MCV 89.7 90.5 91.7  PLT 273 229 [id number]   Basic Metabolic Panel: Recent Labs  Lab 01/02/20 1112 01/03/20 0631 01/04/20 0541 01/04/20 1346  NA 136 136 135 132*  K 4.6 4.1 4.1 4.2  CL 104 109 109 105  CO2 20* 21* 16* 18*  GLUCOSE 242* 167* 191* 262*  BUN 25* 27* 26* 23  CREATININE 1.06* 0.99 1.07* 0.96  CALCIUM 8.8* 7.8* 8.1* 8.0*   GFR: Estimated Creatinine Clearance: 58.8 mL/min (by C-G formula based on SCr of 0.96 mg/dL). Liver Function Tests: No results for input(s): AST, ALT, ALKPHOS, BILITOT, PROT, ALBUMIN in the last 168 hours. No results for input(s): LIPASE, AMYLASE in the last 168 hours. No results for input(s): AMMONIA in the last 168 hours. Coagulation Profile: No results for input(s): INR, PROTIME in  the last 168 hours. Cardiac Enzymes: No results for input(s): CKTOTAL, CKMB, CKMBINDEX, TROPONINI in the last 168 hours. BNP (last 3 results) No results for input(s): PROBNP in the last 8760 hours. HbA1C: No results for input(s): HGBA1C in the last 72 hours. CBG: Recent Labs  Lab 01/03/20 1120 01/03/20 1705 01/03/20 2057 01/04/20 0739 01/04/20 1127  GLUCAP 226* 210* 203* 157* 149*   Lipid Profile: No results for input(s): CHOL, HDL, LDLCALC, TRIG, CHOLHDL, LDLDIRECT in the last 72 hours. Thyroid Function Tests: No results for input(s): TSH, T4TOTAL, FREET4, T3FREE, THYROIDAB in the last 72 hours. Anemia Panel: No results for input(s): VITAMINB12, FOLATE, FERRITIN, TIBC, IRON, RETICCTPCT in the last 72 hours. Sepsis Labs: Recent Labs  Lab 01/02/20 1112 01/02/20 1205 01/02/20 1411 01/02/20 1602 01/02/20 1834 01/03/20 1006  PROCALCITON <0.10  --   --   --   --   --   LATICACIDVEN  --    < > 3.4* 2.6* 2.5* 1.9   < > = values in this interval not displayed.    Recent Results (from the past 240 hour(s))  Blood culture (routine x 2)     Status: None (Preliminary result)   Collection Time: 01/02/20 12:05 PM   Specimen: BLOOD  Result Value Ref Range Status   Specimen Description BLOOD BLOOD LEFT ARM  Final   Special Requests   Final  BOTTLES DRAWN AEROBIC AND ANAEROBIC Blood Culture results may not be optimal due to an inadequate volume of blood received in culture bottles   Culture   Final    NO GROWTH 2 DAYS Performed at [facility], [address] [place], [place] [address]    Report Status PENDING  Incomplete  Blood culture (routine x 2)     Status: None (Preliminary result)   Collection Time: 01/02/20 12:05 PM   Specimen: BLOOD  Result Value Ref Range Status   Specimen Description BLOOD BLOOD LEFT HAND  Final   Special Requests   Final    BOTTLES DRAWN AEROBIC AND ANAEROBIC Blood Culture results may not be optimal due to an inadequate volume of blood  received in culture bottles   Culture   Final    NO GROWTH 2 DAYS Performed at [facility], [address], [place], [place] [address]    Report Status PENDING  Incomplete  SARS CORONAVIRUS 2 (TAT 6-24 HRS) Nasopharyngeal Nasopharyngeal Swab     Status: None   Collection Time: 01/02/20  1:36 PM   Specimen: Nasopharyngeal Swab  Result Value Ref Range Status   SARS Coronavirus 2 NEGATIVE NEGATIVE Final    Comment: (NOTE) SARS-CoV-2 target nucleic acids are NOT DETECTED. The SARS-CoV-2 RNA is generally detectable in upper and lower respiratory specimens during the acute phase of infection. Negative results do not preclude SARS-CoV-2 infection, do not rule out co-infections with other pathogens, and should not be used as the sole basis for treatment or other patient management decisions. Negative results must be combined with clinical observations, patient history, and epidemiological information. The expected result is Negative. Fact Sheet for Patients: SugarRoll.be Fact Sheet for Healthcare Providers: https://www.woods-mathews.com/ This test is not yet approved or cleared by the [place] FDA and  has been authorized for detection and/or diagnosis of SARS-CoV-2 by FDA under an Emergency Use Authorization (EUA). This EUA will remain  in effect (meaning this test can be used) for the duration of the COVID-19 declaration under Section 56 4(b)(1) of the Act, 21 U.S.C. section 360bbb-3(b)(1), unless the authorization is terminated or revoked sooner. Performed at [facility], [place] [address] [place],  [address]      Radiology Studies: US Venous Img Upper Uni Right(DVT)  Result Date: 01/02/2020 CLINICAL DATA:  Cellulitis of the right upper extremity EXAM: RIGHT UPPER EXTREMITY VENOUS DOPPLER ULTRASOUND TECHNIQUE: Gray-scale sonography with graded compression, as well as color Doppler and duplex ultrasound were  performed to evaluate the upper extremity deep venous system from the level of the subclavian vein and including the jugular, axillary, basilic, radial, ulnar and upper cephalic vein. Spectral Doppler was utilized to evaluate flow at rest and with distal augmentation maneuvers. COMPARISON:  None. FINDINGS: Contralateral Subclavian Vein: Respiratory phasicity is normal and symmetric with the symptomatic side. No evidence of thrombus. Normal compressibility. Internal Jugular Vein: No evidence of thrombus. Normal compressibility, respiratory phasicity and response to augmentation. Subclavian Vein: No evidence of thrombus. Normal compressibility, respiratory phasicity and response to augmentation. Axillary Vein: No evidence of thrombus. Normal compressibility, respiratory phasicity and response to augmentation. Cephalic Vein: No evidence of thrombus. Normal compressibility, respiratory phasicity and response to augmentation. Basilic Vein: No evidence of thrombus. Normal compressibility, respiratory phasicity and response to augmentation. Brachial Veins: No evidence of thrombus. Normal compressibility, respiratory phasicity and response to augmentation. Radial Veins: No evidence of thrombus. Normal compressibility, respiratory phasicity and response to augmentation. Ulnar Veins: No evidence of thrombus.  Normal compressibility, respiratory phasicity and response to augmentation. Venous Reflux:  None visualized. Other Findings:  None visualized. IMPRESSION: No evidence of DVT within the right upper extremity. Electronically Signed   By: Constance Holster M.D.   On: 01/02/2020 19:20   [place] RT UPPER EXTREM LTD SOFT TISSUE NON VASCULAR  Result Date: 01/03/2020 CLINICAL DATA:  Swelling, redness and tenderness of the dorsal aspect of the right hand and wrist. EXAM: ULTRASOUND RIGHT UPPER EXTREMITY LIMITED TECHNIQUE: Ultrasound examination of the upper extremity soft tissues was performed in the area of clinical concern.  COMPARISON:  None. FINDINGS: There is subcutaneous edema on the dorsum of the wrist and hand but there is no definable abscess or mass. No appreciable tenosynovitis. IMPRESSION: Subcutaneous edema in the dorsum of the hand and wrist without a definable abscess. The finding is consistent with cellulitis. Electronically Signed   By: Lorriane Shire M.D.   On: 01/03/2020 14:15    Scheduled Meds: . allopurinol  100 mg Oral Daily  . dextromethorphan-guaiFENesin  1 tablet Oral BID  . DULoxetine  30 mg Oral Daily  . enoxaparin (LOVENOX) injection  40 mg Subcutaneous Q24H  . ferrous sulfate  325 mg Oral BID WC  . insulin aspart  0-5 Units Subcutaneous QHS  . insulin aspart  0-9 Units Subcutaneous TID WC  . mometasone-formoterol  2 puff Inhalation BID  . pantoprazole  40 mg Oral Daily  . simvastatin  20 mg Oral Daily  . sodium chloride flush  10-40 mL Intracatheter Q12H   Continuous Infusions: . sodium chloride Stopped (01/04/20 1052)  . cefTRIAXone (ROCEPHIN)  IV 2 g (01/04/20 1225)  . lactated ringers    . vancomycin Stopped (01/03/20 1855)     LOS: 1 day   Time spent: 35 minutes  Lorella Nimrod, MD [facility]  If 7PM-7AM, please contact night-coverage [email or web address]  01/04/2020, 2:44 PM   This record has been created using Systems analyst. Errors have been sought and corrected,but may not always be located. Such creation errors do not reflect on the standard of care.

## 2020-01-05 ENCOUNTER — Inpatient Hospital Stay: Admit: 2020-01-05 | Payer: Medicare Other

## 2020-01-05 ENCOUNTER — Inpatient Hospital Stay
Admit: 2020-01-05 | Discharge: 2020-01-05 | Disposition: A | Discharge status: Home / Self Care | Payer: Medicare Other | Attending: Internal Medicine | Admitting: Internal Medicine

## 2020-01-05 LAB — CBC WITH DIFFERENTIAL/PLATELET
Abs Immature Granulocytes: 0.27 10*3/uL — ABNORMAL HIGH (ref 0.00–0.07)
Basophils Absolute: 0.1 10*3/uL (ref 0.0–0.1)
Basophils Relative: 1 %
Eosinophils Absolute: 0.3 10*3/uL (ref 0.0–0.5)
Eosinophils Relative: 2 %
HCT: 29.7 % — ABNORMAL LOW (ref 36.0–46.0)
Hemoglobin: 9.4 g/dL — ABNORMAL LOW (ref 12.0–15.0)
Immature Granulocytes: 2 %
Lymphocytes Relative: 10 %
Lymphs Abs: 1.3 10*3/uL (ref 0.7–4.0)
MCH: 28.2 pg (ref 26.0–34.0)
MCHC: 31.6 g/dL (ref 30.0–36.0)
MCV: 89.2 fL (ref 80.0–100.0)
Monocytes Absolute: 0.9 10*3/uL (ref 0.1–1.0)
Monocytes Relative: 7 %
Neutro Abs: 10 10*3/uL — ABNORMAL HIGH (ref 1.7–7.7)
Neutrophils Relative %: 78 %
Platelets: 213 10*3/uL (ref 150–400)
RBC: 3.33 MIL/uL — ABNORMAL LOW (ref 3.87–5.11)
RDW: 16.5 % — ABNORMAL HIGH (ref 11.5–15.5)
WBC: 12.9 10*3/uL — ABNORMAL HIGH (ref 4.0–10.5)
nRBC: 0 % (ref 0.0–0.2)

## 2020-01-05 LAB — BASIC METABOLIC PANEL
Anion gap: 9 (ref 5–15)
BUN: 20 mg/dL (ref 8–23)
CO2: 20 mmol/L — ABNORMAL LOW (ref 22–32)
Calcium: 8.5 mg/dL — ABNORMAL LOW (ref 8.9–10.3)
Chloride: 107 mmol/L (ref 98–111)
Creatinine, Ser: 1.09 mg/dL — ABNORMAL HIGH (ref 0.44–1.00)
GFR calc Af Amer: >60 mL/min (ref >60)
GFR calc non Af Amer: 53 mL/min — ABNORMAL LOW (ref >60)
Glucose, Bld: 170 mg/dL — ABNORMAL HIGH (ref 70–99)
Potassium: 4.3 mmol/L (ref 3.5–5.1)
Sodium: 136 mmol/L (ref 135–145)

## 2020-01-05 LAB — GLUCOSE, CAPILLARY
Glucose-Capillary: 143 mg/dL — ABNORMAL HIGH (ref 70–99)
Glucose-Capillary: 147 mg/dL — ABNORMAL HIGH (ref 70–99)
Glucose-Capillary: 147 mg/dL — ABNORMAL HIGH (ref 70–99)
Glucose-Capillary: 181 mg/dL — ABNORMAL HIGH (ref 70–99)

## 2020-01-05 MED ORDER — ENOXAPARIN SODIUM 40 MG/0.4ML ~~LOC~~ SOLN
40.0000 mg | Freq: Two times a day (BID) | SUBCUTANEOUS | Status: DC
  Administered 2020-01-05 – 2020-01-08 (×7): 40 mg via SUBCUTANEOUS
  Filled 2020-01-05 (×7): qty 0.4

## 2020-01-05 NOTE — Progress Notes (Signed)
Anticoagulation monitoring(Lovenox):  69 yo  female ordered Lovenox 40 mg Q24h  Filed Weights   01/02/20 1030  Weight: 213 lb (96.6 kg)   Body mass index is 43.02 kg/m.   Lab Results  Component Value Date   CREATININE 1.09 (H) 01/05/2020   CREATININE 0.96 01/04/2020   CREATININE 1.07 (H) 01/04/2020   Estimated Creatinine Clearance: 51.8 mL/min (A) (by C-G formula based on SCr of 1.09 mg/dL (H)). Hemoglobin & Hematocrit     Component Value Date/Time   HGB 9.4 (L) 01/05/2020 0501   HGB 13.4 12/10/2011 0903   HCT 29.7 (L) 01/05/2020 0501   HCT 41.5 12/10/2011 0903     Per Protocol for Patient with estCrcl > 30 ml/min and BMI > 40, will transition to Lovenox 40 mg Q12h.

## 2020-01-05 NOTE — Consult Note (Signed)
Pharmacy Antibiotic Note  [name] [name] is a 69 y.o. female admitted on 01/02/2020 with Cellulitis .  Pharmacy has been consulted for Vancomycin dosing. Pt also ordered ceftriaxone 2 g IV q24h.   Patient was taking keflex prior to admission.   Vancomycin 1000mg  IV x 2 doses and ceftriaxone ordered in ED.   Plan: Continue Vancomycin 750 mg IV Q 24 hrs. Goal AUC 400-550. Expected AUC: 468 SCr used: 1.09 Expected Cmin: 13.2 IBW, Vd: 0.5   Pharmacy will continue to monitor renal function and adjust dose as needed. SCr in AM.    Height: 4\' 11"  (149.9 cm) Weight: 213 lb (96.6 kg) IBW/kg (Calculated) : 43.2  Temp (24hrs), Avg:98.3 F (36.8 C), Min:97.8 F (36.6 C), Max:98.8 F (37.1 C)  Recent Labs  Lab 01/02/20 1112 01/02/20 1205 01/02/20 1411 01/02/20 1602 01/02/20 1834 01/03/20 0631 01/03/20 1006 01/04/20 0541 01/04/20 1346 01/05/20 0501  WBC 26.0*  --   --   --   --  23.1*  --  18.3*  --  12.9*  CREATININE 1.06*  --   --   --   --  0.99  --  1.07* 0.96 1.09*  LATICACIDVEN  --  2.5* 3.4* 2.6* 2.5*  --  1.9  --   --   --     Estimated Creatinine Clearance: 51.8 mL/min (A) (by C-G formula based on SCr of 1.09 mg/dL (H)).    Allergies  Allergen Reactions  . Acetaminophen-Codeine Other (See Comments)    Other reaction(s): Unknown   . Codeine Nausea And Vomiting  . Furosemide Other (See Comments)  . Penicillin V Potassium     Other reaction(s): Unknown  . Quinine     Other reaction(s): Unknown  . Tramadol     Other reaction(s): Unknown  . Tylenol [Acetaminophen]     Tylenol-Codeine: Dizziness    Antimicrobials this admission: 3/27 vancomycin >>  3/27 Ceftriaxone >>   Microbiology results: 3/27 BCx: NGTD x2  Thank you for allowing pharmacy to be a part of this patient's care.  Rocky Morel, PharmD, BCPS Clinical Pharmacist 01/05/2020 9:30 AM

## 2020-01-05 NOTE — Progress Notes (Signed)
PROGRESS NOTE    [name] [name]  [id number] DOB: [birth year] DOA: 01/02/2020 PCP: [name] Harrier, MD   Brief Narrative:  [name] [name] is a 69 y.o. female with medical history significant of hypertension, hyperlipidemia, diabetes mellitus, COPD, asthma, TIA, GERD, depression, pulmonary fibrosis, iron deficiency anemia, CKD stage III, who presents with right forearm pain.  Patient states that she has been having right forearm pain and swelling for almost 3 weeks. Patient states 2 weeks ago at the orthopedic clinic and was given tapered steroids and antibiotics. Patient states she took Keflex and Medrol Dosepak. She states that her arm pain initially improved, but returned back after she finished taking medicine.  Admitted for cellulitis with failed outpatient therapy.  Subjective: Continue to complain about right forearm pain and swelling.  Assessment & Plan:   Principal Problem:   Cellulitis of right arm Active Problems:   CKD (chronic kidney disease), stage IIIa   Type II diabetes mellitus with renal manifestations (HCC)   Hyperlipidemia   Pulmonary fibrosis (HCC)   Sepsis (HCC)   Asthma   HTN (hypertension)   Cellulitis  Sepsis due to cellulitis of right arm: Sepsis ruled out.  Patient meets critical for sepsis with leukocytosis, tachycardia, elevated lactic acid.  Currently hemodynamically stable. Blood cultures remain negative.  Lactic acidosis resolved. Soft tissue ultrasound was obtained due to fluctuating edema of right distal forearm which was negative for any abscess.  Venous Doppler of right upper extremity obtained yesterday was negative for DVT. -Continue empiric antibiotics with vancomycin and Rocephin. -Clinically seems improving, continues to have edema.  Dyspnea with tachycardia.  Patient developed tachycardia with heart rate in 120s with dyspnea while using bathroom.  No chest pain.  EKG with sinus tachycardia.  It took a little while to settle down.   Continue to saturate well on room air. -Get echocardiogram.  Leukocytosis.  Patient has chronic leukocytosis which worsened with infection. Started improving.  She follow-up with hematology who were advising some recent work-up. -Continue to follow-up with hematology for further management.  Metabolic acidosis.  Bicarb low .  No anion gap.  Patient has CKD and infection.  Lactic acidosis resolved. -Give her more fluid-improvement to bicarb to 20. -Monitor BMP.  CKD (chronic kidney disease), stage IIIa: Stable. -Continue to monitor. -Avoid nephrotoxins.  Type II diabetes mellitus with renal manifestations ([place]): Last A1c 8.4 on 11/18/2019, poorly controlled.  Patient is taking Metformin and Amaryl at home -SSI  Hyperlipidemia -Zocor  Pulmonary fibrosis and asthma: Stable -Bronchodilators, as needed Mucinex.  Hypertension.  Blood pressure within goal. -Home dose of Cozaar was held due to concern of hypotension with sepsis. -Continue to hold-will be restarted as needed.  Incidental finding of cystic pancreatic lesion.  Patient has CT abdomen done during her prior admission in February 2021 secondary to fall resulted in multiple rib fractures.  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  MRI with pancreatic protocol was advised.  It was mentioned on her prior discharge summary.  No mention during her follow-up visit with her PCP or hematologist.  Patient was not aware of any findings.  Discussed with radiologist and they were recommending outpatient pancreatic protocol MRI for follow-up.  We will communicate with her oncologist and PCP on discharge for further follow-up and management.  Objective: Vitals:   01/04/20 1516 01/05/20 0050 01/05/20 0735 01/05/20 1100  BP: (!) 129/57 122/67 (!) 156/74   Pulse: (!) 106 98    Resp: 18 17 20  18  Temp: 98.3 F (36.8 C) 98.8 F (37.1 C) 97.8 F (36.6 C)   TempSrc: Oral  Oral   SpO2: 100% 100%     Weight:      Height:        Intake/Output Summary (Last 24 hours) at 01/05/2020 1539 Last data filed at 01/05/2020 1416 Gross per 24 hour  Intake 1657.24 ml  Output --  Net 1657.24 ml   Filed Weights   01/02/20 1030  Weight: 96.6 kg    Examination:  General exam: Appears calm and comfortable  Respiratory system: Clear to auscultation. Respiratory effort normal. Cardiovascular system: S1 & S2 heard, RRR. No JVD, murmurs, rubs, gallops or clicks. Gastrointestinal system: Soft, nontender, nondistended, bowel sounds positive. Central nervous system: Alert and oriented. No focal neurological deficits.Symmetric 5 x 5 power. Extremities: Right distal forearm  edema involving right hand with mild erythema and hyperthermia, no cyanosis, pulses intact and symmetrical. Psychiatry: Judgement and insight appear normal. Mood & affect appropriate.    DVT prophylaxis: Lovenox Code Status: Full Family Communication: Son was updated on phone. Disposition Plan: Pending improvement.  Patient will go back home.  Consultants:   None  Procedures:  Antimicrobials:  Rocephin Vancomycin  Data Reviewed: I have personally reviewed following labs and imaging studies  CBC: Recent Labs  Lab 01/02/20 1112 01/03/20 0631 01/04/20 0541 01/05/20 0501  WBC 26.0* 23.1* 18.3* 12.9*  NEUTROABS 21.0*  --   --  10.0*  HGB 12.2 10.4* 10.0* 9.4*  HCT 39.0 33.2* 32.1* 29.7*  MCV 89.7 90.5 91.7 89.2  PLT 273 229 223 [id number]   Basic Metabolic Panel: Recent Labs  Lab 01/02/20 1112 01/03/20 0631 01/04/20 0541 01/04/20 1346 01/05/20 0501  NA 136 136 135 132* 136  K 4.6 4.1 4.1 4.2 4.3  CL 104 109 109 105 107  CO2 20* 21* 16* 18* 20*  GLUCOSE 242* 167* 191* 262* 170*  BUN 25* 27* 26* 23 20  CREATININE 1.06* 0.99 1.07* 0.96 1.09*  CALCIUM 8.8* 7.8* 8.1* 8.0* 8.5*   GFR: Estimated Creatinine Clearance: 51.8 mL/min (A) (by C-G formula based on SCr of 1.09 mg/dL (H)). Liver Function Tests: No  results for input(s): AST, ALT, ALKPHOS, BILITOT, PROT, ALBUMIN in the last 168 hours. No results for input(s): LIPASE, AMYLASE in the last 168 hours. No results for input(s): AMMONIA in the last 168 hours. Coagulation Profile: No results for input(s): INR, PROTIME in the last 168 hours. Cardiac Enzymes: No results for input(s): CKTOTAL, CKMB, CKMBINDEX, TROPONINI in the last 168 hours. BNP (last 3 results) No results for input(s): PROBNP in the last 8760 hours. HbA1C: No results for input(s): HGBA1C in the last 72 hours. CBG: Recent Labs  Lab 01/04/20 1127 01/04/20 1643 01/04/20 2255 01/05/20 0720 01/05/20 1149  GLUCAP 149* 193* 180* 143* 147*   Lipid Profile: No results for input(s): CHOL, HDL, LDLCALC, TRIG, CHOLHDL, LDLDIRECT in the last 72 hours. Thyroid Function Tests: No results for input(s): TSH, T4TOTAL, FREET4, T3FREE, THYROIDAB in the last 72 hours. Anemia Panel: No results for input(s): VITAMINB12, FOLATE, FERRITIN, TIBC, IRON, RETICCTPCT in the last 72 hours. Sepsis Labs: Recent Labs  Lab 01/02/20 1112 01/02/20 1205 01/02/20 1411 01/02/20 1602 01/02/20 1834 01/03/20 1006  PROCALCITON <0.10  --   --   --   --   --   LATICACIDVEN  --    < > 3.4* 2.6* 2.5* 1.9   < > = values in this interval not displayed.    Recent Results (  from the past 240 hour(s))  Blood culture (routine x 2)     Status: None (Preliminary result)   Collection Time: 01/02/20 12:05 PM   Specimen: BLOOD  Result Value Ref Range Status   Specimen Description BLOOD BLOOD LEFT ARM  Final   Special Requests   Final    BOTTLES DRAWN AEROBIC AND ANAEROBIC Blood Culture results may not be optimal due to an inadequate volume of blood received in culture bottles   Culture   Final    NO GROWTH 3 DAYS Performed at [facility], [address] [place], [place] [address]    Report Status PENDING  Incomplete  Blood culture (routine x 2)     Status: None (Preliminary result)   Collection  Time: 01/02/20 12:05 PM   Specimen: BLOOD  Result Value Ref Range Status   Specimen Description BLOOD BLOOD LEFT HAND  Final   Special Requests   Final    BOTTLES DRAWN AEROBIC AND ANAEROBIC Blood Culture results may not be optimal due to an inadequate volume of blood received in culture bottles   Culture   Final    NO GROWTH 3 DAYS Performed at [facility], [address], [place], [place] [address]    Report Status PENDING  Incomplete  SARS CORONAVIRUS 2 (TAT 6-24 HRS) Nasopharyngeal Nasopharyngeal Swab     Status: None   Collection Time: 01/02/20  1:36 PM   Specimen: Nasopharyngeal Swab  Result Value Ref Range Status   SARS Coronavirus 2 NEGATIVE NEGATIVE Final    Comment: (NOTE) SARS-CoV-2 target nucleic acids are NOT DETECTED. The SARS-CoV-2 RNA is generally detectable in upper and lower respiratory specimens during the acute phase of infection. Negative results do not preclude SARS-CoV-2 infection, do not rule out co-infections with other pathogens, and should not be used as the sole basis for treatment or other patient management decisions. Negative results must be combined with clinical observations, patient history, and epidemiological information. The expected result is Negative. Fact Sheet for Patients: SugarRoll.be Fact Sheet for Healthcare Providers: https://www.woods-mathews.com/ This test is not yet approved or cleared by the [place] FDA and  has been authorized for detection and/or diagnosis of SARS-CoV-2 by FDA under an Emergency Use Authorization (EUA). This EUA will remain  in effect (meaning this test can be used) for the duration of the COVID-19 declaration under Section 56 4(b)(1) of the Act, 21 U.S.C. section 360bbb-3(b)(1), unless the authorization is terminated or revoked sooner. Performed at [facility], [place] [address] [place], [place] [address]      Radiology Studies: No results  found.  Scheduled Meds: . allopurinol  100 mg Oral Daily  . dextromethorphan-guaiFENesin  1 tablet Oral BID  . DULoxetine  30 mg Oral Daily  . enoxaparin (LOVENOX) injection  40 mg Subcutaneous Q12H  . ferrous sulfate  325 mg Oral BID WC  . insulin aspart  0-5 Units Subcutaneous QHS  . insulin aspart  0-9 Units Subcutaneous TID WC  . mometasone-formoterol  2 puff Inhalation BID  . pantoprazole  40 mg Oral Daily  . simvastatin  20 mg Oral Daily  . sodium chloride flush  10-40 mL Intracatheter Q12H   Continuous Infusions: . cefTRIAXone (ROCEPHIN)  IV Stopped (01/05/20 0911)  . vancomycin Stopped (01/04/20 1819)     LOS: 2 days   Time spent: 35 minutes  Lorella Nimrod, MD [facility]  If 7PM-7AM, please contact night-coverage [email or web address]  01/05/2020, 3:39 PM   This record has  been created using Systems analyst. Errors have been sought and corrected,but may not always be located. Such creation errors do not reflect on the standard of care.

## 2020-01-05 NOTE — Progress Notes (Signed)
*  PRELIMINARY RESULTS* Echocardiogram 2D Echocardiogram has been performed.  [name] [name] 01/05/2020, 1:42 PM

## 2020-01-06 LAB — BASIC METABOLIC PANEL
Anion gap: 7 (ref 5–15)
BUN: 16 mg/dL (ref 8–23)
CO2: 22 mmol/L (ref 22–32)
Calcium: 8.6 mg/dL — ABNORMAL LOW (ref 8.9–10.3)
Chloride: 110 mmol/L (ref 98–111)
Creatinine, Ser: 0.97 mg/dL (ref 0.44–1.00)
GFR calc Af Amer: >60 mL/min (ref >60)
GFR calc non Af Amer: >60 mL/min (ref >60)
Glucose, Bld: 162 mg/dL — ABNORMAL HIGH (ref 70–99)
Potassium: 3.9 mmol/L (ref 3.5–5.1)
Sodium: 139 mmol/L (ref 135–145)

## 2020-01-06 LAB — CBC
HCT: 31.5 % — ABNORMAL LOW (ref 36.0–46.0)
Hemoglobin: 9.9 g/dL — ABNORMAL LOW (ref 12.0–15.0)
MCH: 28.4 pg (ref 26.0–34.0)
MCHC: 31.4 g/dL (ref 30.0–36.0)
MCV: 90.3 fL (ref 80.0–100.0)
Platelets: 220 10*3/uL (ref 150–400)
RBC: 3.49 MIL/uL — ABNORMAL LOW (ref 3.87–5.11)
RDW: 16.4 % — ABNORMAL HIGH (ref 11.5–15.5)
WBC: 11.5 10*3/uL — ABNORMAL HIGH (ref 4.0–10.5)
nRBC: 0 % (ref 0.0–0.2)

## 2020-01-06 LAB — GLUCOSE, CAPILLARY
Glucose-Capillary: 125 mg/dL — ABNORMAL HIGH (ref 70–99)
Glucose-Capillary: 206 mg/dL — ABNORMAL HIGH (ref 70–99)
Glucose-Capillary: 149 mg/dL — ABNORMAL HIGH (ref 70–99)
Glucose-Capillary: 188 mg/dL — ABNORMAL HIGH (ref 70–99)

## 2020-01-06 LAB — ECHOCARDIOGRAM COMPLETE
Height: 59 in
Weight: 3408 oz

## 2020-01-06 MED ORDER — LOSARTAN POTASSIUM 25 MG PO TABS
25.0000 mg | ORAL_TABLET | Freq: Every day | ORAL | Status: DC
  Administered 2020-01-06 – 2020-01-08 (×3): 25 mg via ORAL
  Filled 2020-01-06 (×3): qty 1

## 2020-01-06 NOTE — Progress Notes (Signed)
PROGRESS NOTE    [name] [name]  [id number] DOB: 1951/05/04 DOA: 01/02/2020 PCP: [name] Harrier, MD    Brief Narrative:  [name] Sinnett Smithis a 69 y.o.femalewith medical history significant ofhypertension, hyperlipidemia, diabetes mellitus, COPD, asthma, TIA, GERD, depression, pulmonary fibrosis, iron deficiency anemia, CKD stage III, who presents with right forearm pain.  Patient states that she has been having right forearm pain and swelling for almost 3 weeks.Patient states 2 weeks ago at the orthopedic clinic and was given tapered steroids and antibiotics. Patient states she took Keflex and Medrol Dosepak.She states that her armpain initially improved, but returned back after she finished taking medicine.  Admitted for cellulitis with failed outpatient therapy.    Consultants:     Procedures: venous US RUE-neg dvt  Antimicrobials:   vancomycin , rocephin   Subjective: Feels hand still swollen, some pain. No other complaints.  Objective: Vitals:   01/05/20 1100 01/05/20 1553 01/06/20 0021 01/06/20 0748  BP:  (!) 150/70  (!) 163/94  Pulse:      Resp: 18 20  20   Temp:  98.5 F (36.9 C) 98.6 F (37 C) 98.2 F (36.8 C)  TempSrc:  Oral Oral Oral  SpO2:  98%    Weight:      Height:        Intake/Output Summary (Last 24 hours) at 01/06/2020 1428 Last data filed at 01/06/2020 1045 Gross per 24 hour  Intake 240 ml  Output --  Net 240 ml   Filed Weights   01/02/20 1030  Weight: 96.6 kg    Examination:  General exam: Appears calm and comfortable  Respiratory system: Clear to auscultation. Respiratory effort normal. Cardiovascular system: S1 & S2 heard, RRR. No murmurs, rubs, gallops or clicks.  Gastrointestinal system: Abdomen is nondistended, soft and nontender.  Normal bowel sounds heard. Central nervous system: Alert and oriented. No focal neurological deficits. Extremities: no edema. RUE warm, mild ttp of hand, swelling Skin: No rashes, lesions or  ulcers Psychiatry: Judgement and insight appear normal. Mood & affect appropriate.     Data Reviewed: I have personally reviewed following labs and imaging studies  CBC: Recent Labs  Lab 01/02/20 1112 01/03/20 0631 01/04/20 0541 01/05/20 0501 01/06/20 0504  WBC 26.0* 23.1* 18.3* 12.9* 11.5*  NEUTROABS 21.0*  --   --  10.0*  --   HGB 12.2 10.4* 10.0* 9.4* 9.9*  HCT 39.0 33.2* 32.1* 29.7* 31.5*  MCV 89.7 90.5 91.7 89.2 90.3  PLT 273 229 223 213 [id number]   Basic Metabolic Panel: Recent Labs  Lab 01/03/20 0631 01/04/20 0541 01/04/20 1346 01/05/20 0501 01/06/20 0504  NA 136 135 132* 136 139  K 4.1 4.1 4.2 4.3 3.9  CL 109 109 105 107 110  CO2 21* 16* 18* 20* 22  GLUCOSE 167* 191* 262* 170* 162*  BUN 27* 26* 23 20 16   CREATININE 0.99 1.07* 0.96 1.09* 0.97  CALCIUM 7.8* 8.1* 8.0* 8.5* 8.6*   GFR: Estimated Creatinine Clearance: 58.2 mL/min (by C-G formula based on SCr of 0.97 mg/dL). Liver Function Tests: No results for input(s): AST, ALT, ALKPHOS, BILITOT, PROT, ALBUMIN in the last 168 hours. No results for input(s): LIPASE, AMYLASE in the last 168 hours. No results for input(s): AMMONIA in the last 168 hours. Coagulation Profile: No results for input(s): INR, PROTIME in the last 168 hours. Cardiac Enzymes: No results for input(s): CKTOTAL, CKMB, CKMBINDEX, TROPONINI in the last 168 hours. BNP (last 3 results) No results for input(s): PROBNP in the last  8760 hours. HbA1C: No results for input(s): HGBA1C in the last 72 hours. CBG: Recent Labs  Lab 01/05/20 1149 01/05/20 1644 01/05/20 2109 01/06/20 0746 01/06/20 1152  GLUCAP 147* 147* 181* 125* 206*   Lipid Profile: No results for input(s): CHOL, HDL, LDLCALC, TRIG, CHOLHDL, LDLDIRECT in the last 72 hours. Thyroid Function Tests: No results for input(s): TSH, T4TOTAL, FREET4, T3FREE, THYROIDAB in the last 72 hours. Anemia Panel: No results for input(s): VITAMINB12, FOLATE, FERRITIN, TIBC, IRON, RETICCTPCT in the  last 72 hours. Sepsis Labs: Recent Labs  Lab 01/02/20 1112 01/02/20 1205 01/02/20 1411 01/02/20 1602 01/02/20 1834 01/03/20 1006  PROCALCITON <0.10  --   --   --   --   --   LATICACIDVEN  --    < > 3.4* 2.6* 2.5* 1.9   < > = values in this interval not displayed.    Recent Results (from the past 240 hour(s))  Blood culture (routine x 2)     Status: None (Preliminary result)   Collection Time: 01/02/20 12:05 PM   Specimen: BLOOD  Result Value Ref Range Status   Specimen Description BLOOD BLOOD LEFT ARM  Final   Special Requests   Final    BOTTLES DRAWN AEROBIC AND ANAEROBIC Blood Culture results may not be optimal due to an inadequate volume of blood received in culture bottles   Culture   Final    NO GROWTH 4 DAYS Performed at [facility], [address], [place], [place] [address]    Report Status PENDING  Incomplete  Blood culture (routine x 2)     Status: None (Preliminary result)   Collection Time: 01/02/20 12:05 PM   Specimen: BLOOD  Result Value Ref Range Status   Specimen Description BLOOD BLOOD LEFT HAND  Final   Special Requests   Final    BOTTLES DRAWN AEROBIC AND ANAEROBIC Blood Culture results may not be optimal due to an inadequate volume of blood received in culture bottles   Culture   Final    NO GROWTH 4 DAYS Performed at [facility], [address] [place], [place] [address]    Report Status PENDING  Incomplete  SARS CORONAVIRUS 2 (TAT 6-24 HRS) Nasopharyngeal Nasopharyngeal Swab     Status: None   Collection Time: 01/02/20  1:36 PM   Specimen: Nasopharyngeal Swab  Result Value Ref Range Status   SARS Coronavirus 2 NEGATIVE NEGATIVE Final    Comment: (NOTE) SARS-CoV-2 target nucleic acids are NOT DETECTED. The SARS-CoV-2 RNA is generally detectable in upper and lower respiratory specimens during the acute phase of infection. Negative results do not preclude SARS-CoV-2 infection, do not rule out co-infections with other  pathogens, and should not be used as the sole basis for treatment or other patient management decisions. Negative results must be combined with clinical observations, patient history, and epidemiological information. The expected result is Negative. Fact Sheet for Patients: SugarRoll.be Fact Sheet for Healthcare Providers: https://www.woods-mathews.com/ This test is not yet approved or cleared by the [place] FDA and  has been authorized for detection and/or diagnosis of SARS-CoV-2 by FDA under an Emergency Use Authorization (EUA). This EUA will remain  in effect (meaning this test can be used) for the duration of the COVID-19 declaration under Section 56 4(b)(1) of the Act, 21 U.S.C. section 360bbb-3(b)(1), unless the authorization is terminated or revoked sooner. Performed at [facility], [place] [address], [place], [place] [address]  Radiology Studies: ECHOCARDIOGRAM COMPLETE  Result Date: 01/06/2020    ECHOCARDIOGRAM REPORT   Patient Name:   TALIJAH CHESEBRO Date of Exam: 01/05/2020 Medical Rec #:  [id number]     Height:       59.0 in Accession #:    [id number]    Weight:       213.0 lb Date of Birth:  1951/08/21     BSA:          1.894 m Patient Age:    13 years      BP:           156/74 mmHg Patient Gender: F             HR:           102 bpm. Exam Location:  [facility] Procedure: 2D Echo, Color Doppler and Cardiac Doppler Indications:     R06.00 Dyspnea  History:         Patient has prior history of Echocardiogram examinations, most                  recent 12/21/2019. TIA; Risk Factors:Hypertension, Dyslipidemia                  and Diabetes.  Sonographer:     Charmayne Sheer RDCS (AE) Referring Phys:  [id number] [facility] AMIN Diagnosing Phys: Yolonda Kida MD  Sonographer Comments: Suboptimal apical window. Image acquisition challenging due to patient body habitus. IMPRESSIONS  1. Left ventricular ejection fraction, by estimation, is 60 to  65%. The left ventricle has normal function. The left ventricle has no regional wall motion abnormalities. Left ventricular diastolic parameters are consistent with Grade I diastolic dysfunction (impaired relaxation).  2. Right ventricular systolic function is normal. The right ventricular size is normal. There is moderately elevated pulmonary artery systolic pressure.  3. The mitral valve is normal in structure. Mild mitral valve regurgitation.  4. The aortic valve is grossly normal. Aortic valve regurgitation is mild to moderate. Mild to moderate aortic valve sclerosis/calcification is present, without any evidence of aortic stenosis. FINDINGS  Left Ventricle: Left ventricular ejection fraction, by estimation, is 60 to 65%. The left ventricle has normal function. The left ventricle has no regional wall motion abnormalities. The left ventricular internal cavity size was normal in size. There is  no left ventricular hypertrophy. Left ventricular diastolic parameters are consistent with Grade I diastolic dysfunction (impaired relaxation). Right Ventricle: The right ventricular size is normal. No increase in right ventricular wall thickness. Right ventricular systolic function is normal. There is moderately elevated pulmonary artery systolic pressure. The tricuspid regurgitant velocity is 3.03 m/s, and with an assumed right atrial pressure of 10 mmHg, the estimated right ventricular systolic pressure is 123456 mmHg. Left Atrium: Left atrial size was normal in size. Right Atrium: Right atrial size was normal in size. Pericardium: There is no evidence of pericardial effusion. Mitral Valve: The mitral valve is normal in structure. Mild mitral valve regurgitation. MV peak gradient, 5.6 mmHg. The mean mitral valve gradient is 3.0 mmHg. Tricuspid Valve: The tricuspid valve is normal in structure. Tricuspid valve regurgitation is mild. Aortic Valve: The aortic valve is grossly normal. Aortic valve regurgitation is mild to  moderate. Aortic regurgitation PHT measures 423 msec. Mild to moderate aortic valve sclerosis/calcification is present, without any evidence of aortic stenosis. Aortic valve mean gradient measures 19.0 mmHg. Aortic valve peak gradient measures 36.6 mmHg. Aortic valve area, by VTI measures 1.04 cm. Pulmonic Valve: The pulmonic valve  was grossly normal. Pulmonic valve regurgitation is not visualized. Aorta: The aortic root is normal in size and structure. IAS/Shunts: The atrial septum is grossly normal.  LEFT VENTRICLE PLAX 2D LVIDd:         3.52 cm  Diastology LVIDs:         2.47 cm  LV e' lateral:   12.50 cm/s LV PW:         1.20 cm  LV E/e' lateral: 7.1 LV IVS:        0.91 cm  LV e' medial:    13.80 cm/s LVOT diam:     1.90 cm  LV E/e' medial:  6.4 LV SV:         46 LV SV Index:   24 LVOT Area:     2.84 cm  LEFT ATRIUM             Index LA diam:        3.20 cm 1.69 cm/m LA Vol (A2C):   31.6 ml 16.68 ml/m LA Vol (A4C):   48.8 ml 25.76 ml/m LA Biplane Vol: 39.1 ml 20.64 ml/m  AORTIC VALVE                    PULMONIC VALVE AV Area (Vmax):    0.90 cm     PV Vmax:       1.31 m/s AV Area (Vmean):   0.94 cm     PV Vmean:      83.800 cm/s AV Area (VTI):     1.04 cm     PV VTI:        0.167 m AV Vmax:           302.50 cm/s  PV Peak grad:  6.9 mmHg AV Vmean:          197.500 cm/s PV Mean grad:  3.0 mmHg AV VTI:            0.440 m AV Peak Grad:      36.6 mmHg AV Mean Grad:      19.0 mmHg LVOT Vmax:         95.80 cm/s LVOT Vmean:        65.400 cm/s LVOT VTI:          0.161 m LVOT/AV VTI ratio: 0.37 AI PHT:            423 msec  AORTA Ao Root diam: 3.00 cm MITRAL VALVE                TRICUSPID VALVE MV Area (PHT): 5.66 cm     TR Peak grad:   36.7 mmHg MV Peak grad:  5.6 mmHg     TR Vmax:        303.00 cm/s MV Mean grad:  3.0 mmHg MV Vmax:       1.18 m/s     SHUNTS MV Vmean:      74.7 cm/s    Systemic VTI:  0.16 m MV Decel Time: 134 msec     Systemic Diam: 1.90 cm MV E velocity: 88.30 cm/s MV A velocity: 113.00 cm/s MV  E/A ratio:  0.78 Dwayne D Callwood MD Electronically signed by Yolonda Kida MD Signature Date/Time: 01/06/2020/8:03:40 AM    Final         Scheduled Meds: . allopurinol  100 mg Oral Daily  . dextromethorphan-guaiFENesin  1 tablet Oral BID  . DULoxetine  30 mg Oral Daily  . enoxaparin (LOVENOX) injection  40 mg Subcutaneous Q12H  . ferrous sulfate  325 mg Oral BID WC  . insulin aspart  0-5 Units Subcutaneous QHS  . insulin aspart  0-9 Units Subcutaneous TID WC  . losartan  25 mg Oral Daily  . mometasone-formoterol  2 puff Inhalation BID  . pantoprazole  40 mg Oral Daily  . simvastatin  20 mg Oral Daily  . sodium chloride flush  10-40 mL Intracatheter Q12H   Continuous Infusions: . cefTRIAXone (ROCEPHIN)  IV 2 g (01/06/20 0932)  . vancomycin 750 mg (01/05/20 1642)    Assessment & Plan:   Principal Problem:   Cellulitis of right arm Active Problems:   CKD (chronic kidney disease), stage IIIa   Type II diabetes mellitus with renal manifestations (HCC)   Hyperlipidemia   Pulmonary fibrosis (HCC)   Sepsis (HCC)   Asthma   HTN (hypertension)   Cellulitis   Sepsis due to cellulitis of right [id number] meets critical for sepsis with leukocytosis, tachycardia, elevated lactic acid. Currently hemodynamically stable. Blood cultures negative to date Lactic acidosis resolved. Soft tissue ultrasound was obtained due to fluctuating edema of right distal forearm which was negative for any abscess.  Venous Doppler of right upper extremity -negative for DVT. -Continue empiric antibiotics with vancomycin and Rocephin.  Leukocytosis.  Patient has chronic leukocytosis which worsened with infection.  She follow-up with hematology who were advising some recent work-up. Currently leukocytosis improving.  We will continue IV antibiotics  CKD (chronic kidney disease), stage IIIa:Stable. -Continue to monitor. -Avoid nephrotoxins.  Type II diabetes mellitus with renal  manifestations (HCC):Last A1c 8.4 on 11/18/2019, poorly controlled. Patient is taking Metformin and Amaryl at home -SSI  Hyperlipidemia -Zocor  Pulmonary fibrosisand asthma:Stable -Bronchodilators,as needed Mucinex.  Hypertension.  Blood pressure within goal. -Home dose of Cozaar was held due to concern of hypotension with sepsis. -We will resume Cozaar at 25 mg daily increase to home dose if needed.  Incidental finding of cystic pancreatic lesion.  Patient has CT abdomen done during her prior admission in February 2021 secondary to fall resulted in multiple rib fractures.  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  MRI with pancreatic protocol was advised.  It was mentioned on her prior discharge summary.  No mention during her follow-up visit with her PCP or hematologist.  Patient was not aware of any findings.  Discussed with radiologist and they were recommending outpatient pancreatic protocol MRI for follow-up.  We will communicate with her oncologist and PCP on discharge for further follow-up and management.   DVT prophylaxis: Lovenox Code Status: Full Family Communication: None at bedside Disposition Plan: Possible DC in 1 to 2 days if right upper extremity cellulitis improves Barrier: Still requires IV antibiotics for right upper extremity cellulitis as she has failed outpatient therapy.  Possible DC in 1 to 2 days depending on improvement.       LOS: 3 days   Time spent: 45 minutes with more than 50% COC    Nolberto Hanlon, MD [facility] Pager [phone]  If 7PM-7AM, please contact night-coverage [email or web address] Password [facility] 01/06/2020, 2:28 PM

## 2020-01-06 NOTE — Care Management Important Message (Signed)
Important Message  Patient Details  Name: [name] [name] MRN: [id number] Date of Birth: 1951-09-30   Medicare Important Message Given:  Yes     Loann Quill 01/06/2020, 11:09 AM

## 2020-01-07 LAB — GLUCOSE, CAPILLARY
Glucose-Capillary: 151 mg/dL — ABNORMAL HIGH (ref 70–99)
Glucose-Capillary: 219 mg/dL — ABNORMAL HIGH (ref 70–99)
Glucose-Capillary: 148 mg/dL — ABNORMAL HIGH (ref 70–99)
Glucose-Capillary: 225 mg/dL — ABNORMAL HIGH (ref 70–99)

## 2020-01-07 LAB — CULTURE, BLOOD (ROUTINE X 2)
Culture: NO GROWTH
Culture: NO GROWTH

## 2020-01-07 LAB — CBC
HCT: 30.6 % — ABNORMAL LOW (ref 36.0–46.0)
Hemoglobin: 9.7 g/dL — ABNORMAL LOW (ref 12.0–15.0)
MCH: 28 pg (ref 26.0–34.0)
MCHC: 31.7 g/dL (ref 30.0–36.0)
MCV: 88.4 fL (ref 80.0–100.0)
Platelets: 228 10*3/uL (ref 150–400)
RBC: 3.46 MIL/uL — ABNORMAL LOW (ref 3.87–5.11)
RDW: 16.3 % — ABNORMAL HIGH (ref 11.5–15.5)
WBC: 11.5 10*3/uL — ABNORMAL HIGH (ref 4.0–10.5)
nRBC: 0 % (ref 0.0–0.2)

## 2020-01-07 NOTE — Progress Notes (Signed)
PROGRESS NOTE    [name] [name]  [id number] DOB: 05-Nov-1950 DOA: 01/02/2020 PCP: Tracie Harrier, MD    Brief Narrative:  [name] [name] a 69 y.o.femalewith medical history significant ofhypertension, hyperlipidemia, diabetes mellitus, COPD, asthma, TIA, GERD, depression, pulmonary fibrosis, iron deficiency anemia, CKD stage III, who presents with right forearm pain.  Patient states that she has been having right forearm pain and swelling for almost 3 weeks.Patient states 2 weeks ago at the orthopedic clinic and was given tapered steroids and antibiotics. Patient states she took Keflex and Medrol Dosepak.She states that her armpain initially improved, but returned back after she finished taking medicine.  Admitted for cellulitis with failed outpatient therapy.    Consultants:     Procedures: venous US RUE-neg dvt  Antimicrobials:   vancomycin , rocephin   Subjective: Feels hand still swollen, some pain. No other complaints.  Objective: Vitals:   01/06/20 2240 01/06/20 2350 01/07/20 0855 01/07/20 0912  BP:  125/74 128/79   Pulse:   (!) 113 (!) 104  Resp: 20 19 20    Temp:  99 F (37.2 C) 97.7 F (36.5 C)   TempSrc:  Oral Oral   SpO2:   100%   Weight:      Height:        Intake/Output Summary (Last 24 hours) at 01/07/2020 1407 Last data filed at 01/06/2020 1842 Gross per 24 hour  Intake 370 ml  Output --  Net 370 ml   Filed Weights   01/02/20 1030  Weight: 96.6 kg    Examination:  General exam: Appears calm and comfortable  Respiratory system: Clear to auscultation. Respiratory effort normal. Cardiovascular system: S1 & S2 heard, RRR. No murmurs, rubs, gallops or clicks.  Gastrointestinal system: Abdomen is nondistended, soft and nontender.  Normal bowel sounds heard. Central nervous system: Alert and oriented. No focal neurological deficits. Extremities: decreased swelling of RUE warm, mild ttp of hand, +erythmea and warm. Skin: Warm  dry Psychiatry: Judgement and insight appear normal. Mood & affect appropriate.     Data Reviewed: I have personally reviewed following labs and imaging studies  CBC: Recent Labs  Lab 01/02/20 1112 01/02/20 1112 01/03/20 0631 01/04/20 0541 01/05/20 0501 01/06/20 0504 01/07/20 0926  WBC 26.0*   < > 23.1* 18.3* 12.9* 11.5* 11.5*  NEUTROABS 21.0*  --   --   --  10.0*  --   --   HGB 12.2   < > 10.4* 10.0* 9.4* 9.9* 9.7*  HCT 39.0   < > 33.2* 32.1* 29.7* 31.5* 30.6*  MCV 89.7   < > 90.5 91.7 89.2 90.3 88.4  PLT 273   < > 229 223 213 220 228   < > = values in this interval not displayed.   Basic Metabolic Panel: Recent Labs  Lab 01/03/20 0631 01/04/20 0541 01/04/20 1346 01/05/20 0501 01/06/20 0504  NA 136 135 132* 136 139  K 4.1 4.1 4.2 4.3 3.9  CL 109 109 105 107 110  CO2 21* 16* 18* 20* 22  GLUCOSE 167* 191* 262* 170* 162*  BUN 27* 26* 23 20 16   CREATININE 0.99 1.07* 0.96 1.09* 0.97  CALCIUM 7.8* 8.1* 8.0* 8.5* 8.6*   GFR: Estimated Creatinine Clearance: 58.2 mL/min (by C-G formula based on SCr of 0.97 mg/dL). Liver Function Tests: No results for input(s): AST, ALT, ALKPHOS, BILITOT, PROT, ALBUMIN in the last 168 hours. No results for input(s): LIPASE, AMYLASE in the last 168 hours. No results for input(s): AMMONIA in the  last 168 hours. Coagulation Profile: No results for input(s): INR, PROTIME in the last 168 hours. Cardiac Enzymes: No results for input(s): CKTOTAL, CKMB, CKMBINDEX, TROPONINI in the last 168 hours. BNP (last 3 results) No results for input(s): PROBNP in the last 8760 hours. HbA1C: No results for input(s): HGBA1C in the last 72 hours. CBG: Recent Labs  Lab 01/06/20 1152 01/06/20 1654 01/06/20 2205 01/07/20 0753 01/07/20 1130  GLUCAP 206* 149* 188* 151* 219*   Lipid Profile: No results for input(s): CHOL, HDL, LDLCALC, TRIG, CHOLHDL, LDLDIRECT in the last 72 hours. Thyroid Function Tests: No results for input(s): TSH, T4TOTAL, FREET4,  T3FREE, THYROIDAB in the last 72 hours. Anemia Panel: No results for input(s): VITAMINB12, FOLATE, FERRITIN, TIBC, IRON, RETICCTPCT in the last 72 hours. Sepsis Labs: Recent Labs  Lab 01/02/20 1112 01/02/20 1205 01/02/20 1411 01/02/20 1602 01/02/20 1834 01/03/20 1006  PROCALCITON <0.10  --   --   --   --   --   LATICACIDVEN  --    < > 3.4* 2.6* 2.5* 1.9   < > = values in this interval not displayed.    Recent Results (from the past 240 hour(s))  Blood culture (routine x 2)     Status: None   Collection Time: 01/02/20 12:05 PM   Specimen: BLOOD  Result Value Ref Range Status   Specimen Description BLOOD BLOOD LEFT ARM  Final   Special Requests   Final    BOTTLES DRAWN AEROBIC AND ANAEROBIC Blood Culture results may not be optimal due to an inadequate volume of blood received in culture bottles   Culture   Final    NO GROWTH 5 DAYS Performed at [facility], [address] [place], [place] [address]    Report Status 01/07/2020 FINAL  Final  Blood culture (routine x 2)     Status: None   Collection Time: 01/02/20 12:05 PM   Specimen: BLOOD  Result Value Ref Range Status   Specimen Description BLOOD BLOOD LEFT HAND  Final   Special Requests   Final    BOTTLES DRAWN AEROBIC AND ANAEROBIC Blood Culture results may not be optimal due to an inadequate volume of blood received in culture bottles   Culture   Final    NO GROWTH 5 DAYS Performed at [facility], [address] [place], [place] [address]    Report Status 01/07/2020 FINAL  Final  SARS CORONAVIRUS 2 (TAT 6-24 HRS) Nasopharyngeal Nasopharyngeal Swab     Status: None   Collection Time: 01/02/20  1:36 PM   Specimen: Nasopharyngeal Swab  Result Value Ref Range Status   SARS Coronavirus 2 NEGATIVE NEGATIVE Final    Comment: (NOTE) SARS-CoV-2 target nucleic acids are NOT DETECTED. The SARS-CoV-2 RNA is generally detectable in upper and lower respiratory specimens during the acute phase of infection.  Negative results do not preclude SARS-CoV-2 infection, do not rule out co-infections with other pathogens, and should not be used as the sole basis for treatment or other patient management decisions. Negative results must be combined with clinical observations, patient history, and epidemiological information. The expected result is Negative. Fact Sheet for Patients: SugarRoll.be Fact Sheet for Healthcare Providers: https://www.woods-mathews.com/ This test is not yet approved or cleared by the [place] FDA and  has been authorized for detection and/or diagnosis of SARS-CoV-2 by FDA under an Emergency Use Authorization (EUA). This EUA will remain  in effect (meaning this test can be used) for the duration of the COVID-19 declaration  under Section 56 4(b)(1) of the Act, 21 U.S.C. section 360bbb-3(b)(1), unless the authorization is terminated or revoked sooner. Performed at [facility], [place] [address], [place], [place] [address]          Radiology Studies: No results found.      Scheduled Meds: . allopurinol  100 mg Oral Daily  . dextromethorphan-guaiFENesin  1 tablet Oral BID  . DULoxetine  30 mg Oral Daily  . enoxaparin (LOVENOX) injection  40 mg Subcutaneous Q12H  . ferrous sulfate  325 mg Oral BID WC  . insulin aspart  0-5 Units Subcutaneous QHS  . insulin aspart  0-9 Units Subcutaneous TID WC  . losartan  25 mg Oral Daily  . mometasone-formoterol  2 puff Inhalation BID  . pantoprazole  40 mg Oral Daily  . simvastatin  20 mg Oral Daily  . sodium chloride flush  10-40 mL Intracatheter Q12H   Continuous Infusions: . cefTRIAXone (ROCEPHIN)  IV 2 g (01/07/20 1107)  . vancomycin 750 mg (01/06/20 1759)    Assessment & Plan:   Principal Problem:   Cellulitis of right arm Active Problems:   CKD (chronic kidney disease), stage IIIa   Type II diabetes mellitus with renal manifestations (HCC)   Hyperlipidemia    Pulmonary fibrosis (HCC)   Sepsis (HCC)   Asthma   HTN (hypertension)   Cellulitis   Sepsis due to cellulitis of right [id number] meets critical for sepsis with leukocytosis, tachycardia, elevated lactic acid. Currently hemodynamically stable. Slowly improving Blood cultures negative to date Lactic acidosis resolved. Soft tissue ultrasound was obtained due to fluctuating edema of right distal forearm which was negative for any abscess.  Venous Doppler of right upper extremity -negative for DVT. -Continue empiric antibiotics with vancomycin and Rocephin.  Leukocytosis.  Patient has chronic leukocytosis which worsened with infection.  She follow-up with hematology who were advising some recent work-up. Currently leukocytosis improving.  We will continue IV antibiotics  CKD (chronic kidney disease), stage IIIa:Stable. -Continue to monitor. -Avoid nephrotoxins.  Type II diabetes mellitus with renal manifestations (HCC):Last A1c 8.4 on 11/18/2019, poorly controlled. Patient is taking Metformin and Amaryl at home -SSI  Hyperlipidemia -Zocor  Pulmonary fibrosisand asthma:Stable -Bronchodilators,as needed Mucinex.  Hypertension.  Blood pressure within goal. -Home dose of Cozaar was held due to concern of hypotension with sepsis. -We will resume Cozaar at 25 mg daily increase to home dose if needed.  Incidental finding of cystic pancreatic lesion.  Patient has CT abdomen done during her prior admission in February 2021 secondary to fall resulted in multiple rib fractures.  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  MRI with pancreatic protocol was advised.  It was mentioned on her prior discharge summary.  No mention during her follow-up visit with her PCP or hematologist.  Patient was not aware of any findings.  Discussed with radiologist and they were recommending outpatient pancreatic protocol MRI for follow-up.  We will communicate  with her oncologist and PCP on discharge for further follow-up and management.   DVT prophylaxis: Lovenox Code Status: Full Family Communication: None at bedside Disposition Plan: Possible DC in 1 to 2 days if right upper extremity cellulitis improves Barrier: Still requires IV antibiotics for right upper extremity cellulitis as she has failed outpatient therapy.  Possible DC in 1 to 2 days depending on improvement.       LOS: 4 days   Time spent: 45 minutes with more than 50% COC    Shikira Folino  Kurtis Bushman, MD [facility] Pager [phone]  If 7PM-7AM, please contact night-coverage [email or web address] Password [facility] 01/07/2020, 2:07 PM Patient ID: ERMINIE MCELMURRAY, female   DOB: [birth year], 69 y.o.   MRN: [id number]

## 2020-01-08 LAB — GLUCOSE, CAPILLARY
Glucose-Capillary: 130 mg/dL — ABNORMAL HIGH (ref 70–99)
Glucose-Capillary: 223 mg/dL — ABNORMAL HIGH (ref 70–99)

## 2020-01-08 MED ORDER — LOSARTAN POTASSIUM 25 MG PO TABS: 50.0000 mg | ORAL_TABLET | Freq: Every day | ORAL | 0 refills | Status: DC

## 2020-01-08 MED ORDER — LEVOFLOXACIN 500 MG PO TABS: 500.0000 mg | ORAL_TABLET | Freq: Every day | ORAL | 0 refills | Status: AC

## 2020-01-08 MED ORDER — VANCOMYCIN HCL 750 MG/150ML IV SOLN
750.0000 mg | INTRAVENOUS | Status: DC
  Administered 2020-01-08: 750 mg via INTRAVENOUS
  Filled 2020-01-08 (×2): qty 150

## 2020-01-08 NOTE — Progress Notes (Signed)
IV removed before discharge. Went over discharge instructions with patient. Patient did not have any questions and understood. Patients daughter came to get patient. Patient was transferred down by wheelchair via transport staff.

## 2020-01-08 NOTE — Care Management Important Message (Signed)
Important Message  Patient Details  Name: [name] [name] MRN: [id number] Date of Birth: Jul 14, 1951   Medicare Important Message Given:  Yes     Juliann Pulse A Irfan Veal 01/08/2020, 11:56 AM

## 2020-01-08 NOTE — Consult Note (Signed)
Pharmacy Antibiotic Note  ALINAH NIENOW is a 69 y.o. female admitted on 01/02/2020 with Cellulitis .  Pharmacy has been consulted for Vancomycin dosing. Pt also ordered ceftriaxone 2 g IV q24h.   Patient was taking keflex prior to admission.   Vancomycin 1000mg  IV x 2 doses and ceftriaxone ordered in ED.   Plan: Continue Vancomycin 750mg  q24h  Will check levels 4/2-4/3 if pt doesn't discharge   Pharmacy will continue to monitor renal function and adjust dose as needed. SCr in AM (no Scr since 3/31).   Will continue Ceftriaxone 2g q24h  Height: 4\' 11"  (149.9 cm) Weight: 96.6 kg (213 lb) IBW/kg (Calculated) : 43.2  Temp (24hrs), Avg:98 F (36.7 C), Min:97.7 F (36.5 C), Max:98.5 F (36.9 C)  Recent Labs  Lab 01/02/20 1112 01/02/20 1205 01/02/20 1411 01/02/20 1602 01/02/20 1834 01/03/20 0631 01/03/20 1006 01/04/20 0541 01/04/20 1346 01/05/20 0501 01/06/20 0504 01/07/20 0926  WBC   < >  --   --   --   --  23.1*  --  18.3*  --  12.9* 11.5* 11.5*  CREATININE   < >  --   --   --   --  0.99  --  1.07* 0.96 1.09* 0.97  --   LATICACIDVEN  --  2.5* 3.4* 2.6* 2.5*  --  1.9  --   --   --   --   --    < > = values in this interval not displayed.    Estimated Creatinine Clearance: 58.2 mL/min (by C-G formula based on SCr of 0.97 mg/dL).    Allergies  Allergen Reactions  . Acetaminophen-Codeine Other (See Comments)    Other reaction(s): Unknown   . Codeine Nausea And Vomiting  . Furosemide Other (See Comments)  . Penicillin V Potassium     Other reaction(s): Unknown  . Quinine     Other reaction(s): Unknown  . Tramadol     Other reaction(s): Unknown  . Tylenol [Acetaminophen]     Tylenol-Codeine: Dizziness    Antimicrobials this admission: 3/27 vancomycin >>  3/27 Ceftriaxone >>   Microbiology results: 3/27 BCx: NGTD x2  Thank you for allowing pharmacy to be a part of this patient's care.  Lu Duffel, PharmD, BCPS Clinical Pharmacist 01/08/2020 7:20  AM

## 2020-01-08 NOTE — Discharge Summary (Signed)
[name] [name] [id number] DOB: March 30, 1951 DOA: 01/02/2020  PCP: Tracie Harrier, MD  Admit date: 01/02/2020 Discharge date: 01/08/2020  Admitted From: Home Disposition: Home  Recommendations for Outpatient Follow-up:  1. Follow up with PCP in 1 week, needs to discuss pancreatic mass. 2. Please obtain BMP/CBC in one week      Discharge Condition:Stable CODE STATUS: Full Diet recommendation: Carb controlled Brief/Interim Summary: [name] [name] is a 69 y.o. female with medical history significant of hypertension, hyperlipidemia, diabetes mellitus, COPD, asthma, TIA, GERD, depression, pulmonary fibrosis, iron deficiency anemia, CKD stage III, who presents with right forearm pain.Patient states that she has been having right forearm pain and swelling for almost 3 weeks. Patient states 2 weeks ago at the orthopedic clinic and was given tapered steroids and antibiotics. Patient states she took Keflex and Medrol Dosepak. She states that her arm pain initially improved, but returned back after she finished taking medicine.  The pain is constant, sharp, 10 out of 10 severity. pt was found to have WBC 26.0, lactic acid 2.5.X-ray of right arm is negative for fracture.  Blood cultures to date was negative.  Upper extremity ultrasound found No evidence of DVT within the right upper extremity.  She was started on IV vancomycin and Rocephin.  Her RUE has improved today, denies pain or swelling.  Able to move hands.  She reports feeling well and wants to go home.     discharge Diagnoses:  Principal Problem:   Cellulitis of right arm Active Problems:   CKD (chronic kidney disease), stage IIIa   Type II diabetes mellitus with renal manifestations (HCC)   Hyperlipidemia   Pulmonary fibrosis (HCC)   Sepsis (HCC)   Asthma   HTN (hypertension)   Cellulitis    Discharge Instructions  Discharge Instructions    Call MD for:  temperature >100.4   Complete by: As directed    Diet - low sodium heart  healthy   Complete by: As directed    Increase activity slowly   Complete by: As directed      Allergies as of 01/08/2020      Reactions   Acetaminophen-codeine Other (See Comments)   Other reaction(s): Unknown   Codeine Nausea And Vomiting   Furosemide Other (See Comments)   Penicillin V Potassium    Other reaction(s): Unknown   Quinine    Other reaction(s): Unknown   Tramadol    Other reaction(s): Unknown   Tylenol [acetaminophen]    Tylenol-Codeine: Dizziness      Medication List    TAKE these medications   albuterol 108 (90 Base) MCG/ACT inhaler Commonly known as: VENTOLIN HFA Inhale 2 puffs into the lungs every 6 (six) hours as needed for wheezing or shortness of breath.   allopurinol 100 MG tablet Commonly known as: ZYLOPRIM Take 100 mg by mouth daily.   budesonide-formoterol 160-4.5 MCG/ACT inhaler Commonly known as: SYMBICORT Inhale 2 puffs into the lungs 2 (two) times daily.   cyclobenzaprine 5 MG tablet Commonly known as: FLEXERIL Take 5 mg by mouth 2 (two) times daily as needed for muscle spasms.   diclofenac Sodium 1 % Gel Commonly known as: VOLTAREN Apply 2 g topically 4 (four) times daily as needed (rib cage pain).   DULoxetine 30 MG capsule Commonly known as: CYMBALTA Take 30 mg by mouth daily.   esomeprazole 40 MG capsule Commonly known as: NEXIUM Take 40 mg by mouth daily.   ferrous sulfate 325 (65 FE) MG tablet Take 1 tablet (325 mg  total) by mouth 2 (two) times daily with a meal.   glimepiride 2 MG tablet Commonly known as: AMARYL Take 2 mg by mouth every morning.   levofloxacin 500 MG tablet Commonly known as: LEVAQUIN Take 1 tablet (500 mg total) by mouth daily for 2 days.   losartan 25 MG tablet Commonly known as: COZAAR Take 2 tablets (50 mg total) by mouth daily. What changed: medication strength   metFORMIN 500 MG tablet Commonly known as: GLUCOPHAGE Take 500 mg by mouth 2 (two) times daily with a meal.   oxyCODONE 5 MG  immediate release tablet Commonly known as: Roxicodone Take 1 tablet (5 mg total) by mouth every 4 (four) hours as needed for severe pain. What changed: when to take this   predniSONE 2.5 MG tablet Commonly known as: DELTASONE Take 2.5 mg by mouth daily.   simvastatin 20 MG tablet Commonly known as: ZOCOR Take 20 mg by mouth daily.      Follow-up Information    Tracie Harrier, MD Follow up in 1 week(s).   Specialty: Internal Medicine Why: pancreatic mass workup and RUE cellulitis Contact information: [place] [address] [phone]          Allergies  Allergen Reactions  . Acetaminophen-Codeine Other (See Comments)    Other reaction(s): Unknown   . Codeine Nausea And Vomiting  . Furosemide Other (See Comments)  . Penicillin V Potassium     Other reaction(s): Unknown  . Quinine     Other reaction(s): Unknown  . Tramadol     Other reaction(s): Unknown  . Tylenol [Acetaminophen]     Tylenol-Codeine: Dizziness    Consultations:     Procedures/Studies: DG Forearm Right  Result Date: 01/02/2020 CLINICAL DATA:  Pain without trauma x2 weeks EXAM: RIGHT FOREARM - 2 VIEW COMPARISON:  None. FINDINGS: There is no evidence of fracture or other focal bone lesions. Soft tissues are unremarkable. IMPRESSION: Negative. Electronically Signed   By: Lucrezia Europe M.D.   On: 01/02/2020 11:40   US Venous Img Upper Uni Right(DVT)  Result Date: 01/02/2020 CLINICAL DATA:  Cellulitis of the right upper extremity EXAM: RIGHT UPPER EXTREMITY VENOUS DOPPLER ULTRASOUND TECHNIQUE: Gray-scale sonography with graded compression, as well as color Doppler and duplex ultrasound were performed to evaluate the upper extremity deep venous system from the level of the subclavian vein and including the jugular, axillary, basilic, radial, ulnar and upper cephalic vein. Spectral Doppler was utilized to evaluate flow at rest and with distal augmentation  maneuvers. COMPARISON:  None. FINDINGS: Contralateral Subclavian Vein: Respiratory phasicity is normal and symmetric with the symptomatic side. No evidence of thrombus. Normal compressibility. Internal Jugular Vein: No evidence of thrombus. Normal compressibility, respiratory phasicity and response to augmentation. Subclavian Vein: No evidence of thrombus. Normal compressibility, respiratory phasicity and response to augmentation. Axillary Vein: No evidence of thrombus. Normal compressibility, respiratory phasicity and response to augmentation. Cephalic Vein: No evidence of thrombus. Normal compressibility, respiratory phasicity and response to augmentation. Basilic Vein: No evidence of thrombus. Normal compressibility, respiratory phasicity and response to augmentation. Brachial Veins: No evidence of thrombus. Normal compressibility, respiratory phasicity and response to augmentation. Radial Veins: No evidence of thrombus. Normal compressibility, respiratory phasicity and response to augmentation. Ulnar Veins: No evidence of thrombus. Normal compressibility, respiratory phasicity and response to augmentation. Venous Reflux:  None visualized. Other Findings:  None visualized. IMPRESSION: No evidence of DVT within the right upper extremity. Electronically Signed   By: Constance Holster  M.D.   On: 01/02/2020 19:20   ECHOCARDIOGRAM COMPLETE  Result Date: 01/06/2020    ECHOCARDIOGRAM REPORT   Patient Name:   [name] [name] Date of Exam: 01/05/2020 Medical Rec #:  [id number]     Height:       59.0 in Accession #:    [id number]    Weight:       213.0 lb Date of Birth:  1951/03/28     BSA:          1.894 m Patient Age:    61 years      BP:           156/74 mmHg Patient Gender: F             HR:           102 bpm. Exam Location:  [facility] Procedure: 2D Echo, Color Doppler and Cardiac Doppler Indications:     R06.00 Dyspnea  History:         Patient has prior history of Echocardiogram examinations, most                  recent  12/21/2019. TIA; Risk Factors:Hypertension, Dyslipidemia                  and Diabetes.  Sonographer:     Charmayne Sheer RDCS (AE) Referring Phys:  [id number] [facility] AMIN Diagnosing Phys: Yolonda Kida MD  Sonographer Comments: Suboptimal apical window. Image acquisition challenging due to patient body habitus. IMPRESSIONS  1. Left ventricular ejection fraction, by estimation, is 60 to 65%. The left ventricle has normal function. The left ventricle has no regional wall motion abnormalities. Left ventricular diastolic parameters are consistent with Grade I diastolic dysfunction (impaired relaxation).  2. Right ventricular systolic function is normal. The right ventricular size is normal. There is moderately elevated pulmonary artery systolic pressure.  3. The mitral valve is normal in structure. Mild mitral valve regurgitation.  4. The aortic valve is grossly normal. Aortic valve regurgitation is mild to moderate. Mild to moderate aortic valve sclerosis/calcification is present, without any evidence of aortic stenosis. FINDINGS  Left Ventricle: Left ventricular ejection fraction, by estimation, is 60 to 65%. The left ventricle has normal function. The left ventricle has no regional wall motion abnormalities. The left ventricular internal cavity size was normal in size. There is  no left ventricular hypertrophy. Left ventricular diastolic parameters are consistent with Grade I diastolic dysfunction (impaired relaxation). Right Ventricle: The right ventricular size is normal. No increase in right ventricular wall thickness. Right ventricular systolic function is normal. There is moderately elevated pulmonary artery systolic pressure. The tricuspid regurgitant velocity is 3.03 m/s, and with an assumed right atrial pressure of 10 mmHg, the estimated right ventricular systolic pressure is 123456 mmHg. Left Atrium: Left atrial size was normal in size. Right Atrium: Right atrial size was normal in size. Pericardium: There is no  evidence of pericardial effusion. Mitral Valve: The mitral valve is normal in structure. Mild mitral valve regurgitation. MV peak gradient, 5.6 mmHg. The mean mitral valve gradient is 3.0 mmHg. Tricuspid Valve: The tricuspid valve is normal in structure. Tricuspid valve regurgitation is mild. Aortic Valve: The aortic valve is grossly normal. Aortic valve regurgitation is mild to moderate. Aortic regurgitation PHT measures 423 msec. Mild to moderate aortic valve sclerosis/calcification is present, without any evidence of aortic stenosis. Aortic valve mean gradient measures 19.0 mmHg. Aortic valve peak gradient measures 36.6 mmHg. Aortic valve area, by VTI measures 1.04  cm. Pulmonic Valve: The pulmonic valve was grossly normal. Pulmonic valve regurgitation is not visualized. Aorta: The aortic root is normal in size and structure. IAS/Shunts: The atrial septum is grossly normal.  LEFT VENTRICLE PLAX 2D LVIDd:         3.52 cm  Diastology LVIDs:         2.47 cm  LV e' lateral:   12.50 cm/s LV PW:         1.20 cm  LV E/e' lateral: 7.1 LV IVS:        0.91 cm  LV e' medial:    13.80 cm/s LVOT diam:     1.90 cm  LV E/e' medial:  6.4 LV SV:         46 LV SV Index:   24 LVOT Area:     2.84 cm  LEFT ATRIUM             Index LA diam:        3.20 cm 1.69 cm/m LA Vol (A2C):   31.6 ml 16.68 ml/m LA Vol (A4C):   48.8 ml 25.76 ml/m LA Biplane Vol: 39.1 ml 20.64 ml/m  AORTIC VALVE                    PULMONIC VALVE AV Area (Vmax):    0.90 cm     PV Vmax:       1.31 m/s AV Area (Vmean):   0.94 cm     PV Vmean:      83.800 cm/s AV Area (VTI):     1.04 cm     PV VTI:        0.167 m AV Vmax:           302.50 cm/s  PV Peak grad:  6.9 mmHg AV Vmean:          197.500 cm/s PV Mean grad:  3.0 mmHg AV VTI:            0.440 m AV Peak Grad:      36.6 mmHg AV Mean Grad:      19.0 mmHg LVOT Vmax:         95.80 cm/s LVOT Vmean:        65.400 cm/s LVOT VTI:          0.161 m LVOT/AV VTI ratio: 0.37 AI PHT:            423 msec  AORTA Ao Root  diam: 3.00 cm MITRAL VALVE                TRICUSPID VALVE MV Area (PHT): 5.66 cm     TR Peak grad:   36.7 mmHg MV Peak grad:  5.6 mmHg     TR Vmax:        303.00 cm/s MV Mean grad:  3.0 mmHg MV Vmax:       1.18 m/s     SHUNTS MV Vmean:      74.7 cm/s    Systemic VTI:  0.16 m MV Decel Time: 134 msec     Systemic Diam: 1.90 cm MV E velocity: 88.30 cm/s MV A velocity: 113.00 cm/s MV E/A ratio:  0.78 Dwayne D Callwood MD Electronically signed by Yolonda Kida MD Signature Date/Time: 01/06/2020/8:03:40 AM    Final    [place] RT UPPER EXTREM LTD SOFT TISSUE NON VASCULAR  Result Date: 01/03/2020 CLINICAL DATA:  Swelling, redness and tenderness of the dorsal aspect of the right hand and  wrist. EXAM: ULTRASOUND RIGHT UPPER EXTREMITY LIMITED TECHNIQUE: Ultrasound examination of the upper extremity soft tissues was performed in the area of clinical concern. COMPARISON:  None. FINDINGS: There is subcutaneous edema on the dorsum of the wrist and hand but there is no definable abscess or mass. No appreciable tenosynovitis. IMPRESSION: Subcutaneous edema in the dorsum of the hand and wrist without a definable abscess. The finding is consistent with cellulitis. Electronically Signed   By: Lorriane Shire M.D.   On: 01/03/2020 14:15      Subjective: No complaints today. Reports RUE much better, no pain and swelling decreased.  Discharge Exam: Vitals:   01/08/20 0100 01/08/20 0738  BP:  118/76  Pulse:  96  Resp: 13 17  Temp:  98.3 F (36.8 C)  SpO2:  100%   Vitals:   01/07/20 2300 01/07/20 2355 01/08/20 0100 01/08/20 0738  BP:  124/77  118/76  Pulse:  97  96  Resp: (!) 34 20 13 17   Temp:  98.5 F (36.9 C)  98.3 F (36.8 C)  TempSrc:  Oral    SpO2:  99%  100%  Weight:      Height:        General: Pt is alert, awake, not in acute distress Cardiovascular: RRR, S1/S2 +, no rubs, no gallops Respiratory: CTA bilaterally, no wheezing, no rhonchi Abdominal: Soft, NT, ND, bowel sounds + Extremities: RUE  decrease erythema and swelling. No pain with movement. NO LE edema, no cyanosis    The results of significant diagnostics from this hospitalization (including imaging, microbiology, ancillary and laboratory) are listed below for reference.     Microbiology: Recent Results (from the past 240 hour(s))  Blood culture (routine x 2)     Status: None   Collection Time: 01/02/20 12:05 PM   Specimen: BLOOD  Result Value Ref Range Status   Specimen Description BLOOD BLOOD LEFT ARM  Final   Special Requests   Final    BOTTLES DRAWN AEROBIC AND ANAEROBIC Blood Culture results may not be optimal due to an inadequate volume of blood received in culture bottles   Culture   Final    NO GROWTH 5 DAYS Performed at [facility], [address] [place], [place] [address]    Report Status 01/07/2020 FINAL  Final  Blood culture (routine x 2)     Status: None   Collection Time: 01/02/20 12:05 PM   Specimen: BLOOD  Result Value Ref Range Status   Specimen Description BLOOD BLOOD LEFT HAND  Final   Special Requests   Final    BOTTLES DRAWN AEROBIC AND ANAEROBIC Blood Culture results may not be optimal due to an inadequate volume of blood received in culture bottles   Culture   Final    NO GROWTH 5 DAYS Performed at [facility], [address], [place], [place] [address]    Report Status 01/07/2020 FINAL  Final  SARS CORONAVIRUS 2 (TAT 6-24 HRS) Nasopharyngeal Nasopharyngeal Swab     Status: None   Collection Time: 01/02/20  1:36 PM   Specimen: Nasopharyngeal Swab  Result Value Ref Range Status   SARS Coronavirus 2 NEGATIVE NEGATIVE Final    Comment: (NOTE) SARS-CoV-2 target nucleic acids are NOT DETECTED. The SARS-CoV-2 RNA is generally detectable in upper and lower respiratory specimens during the acute phase of infection. Negative results do not preclude SARS-CoV-2 infection, do not rule out co-infections with other pathogens, and should not be used as the sole basis for  treatment or other patient management decisions. Negative results must be combined with clinical observations, patient history, and epidemiological information. The expected result is Negative. Fact Sheet for Patients: SugarRoll.be Fact Sheet for Healthcare Providers: https://www.woods-mathews.com/ This test is not yet approved or cleared by the [place] FDA and  has been authorized for detection and/or diagnosis of SARS-CoV-2 by FDA under an Emergency Use Authorization (EUA). This EUA will remain  in effect (meaning this test can be used) for the duration of the COVID-19 declaration under Section 56 4(b)(1) of the Act, 21 U.S.C. section 360bbb-3(b)(1), unless the authorization is terminated or revoked sooner. Performed at [facility], [place] [address] [place], [place] [address]      Labs: BNP (last 3 results) No results for input(s): BNP in the last 8760 hours. Basic Metabolic Panel: Recent Labs  Lab 01/03/20 0631 01/04/20 0541 01/04/20 1346 01/05/20 0501 01/06/20 0504  NA 136 135 132* 136 139  K 4.1 4.1 4.2 4.3 3.9  CL 109 109 105 107 110  CO2 21* 16* 18* 20* 22  GLUCOSE 167* 191* 262* 170* 162*  BUN 27* 26* 23 20 16   CREATININE 0.99 1.07* 0.96 1.09* 0.97  CALCIUM 7.8* 8.1* 8.0* 8.5* 8.6*   Liver Function Tests: No results for input(s): AST, ALT, ALKPHOS, BILITOT, PROT, ALBUMIN in the last 168 hours. No results for input(s): LIPASE, AMYLASE in the last 168 hours. No results for input(s): AMMONIA in the last 168 hours. CBC: Recent Labs  Lab 01/02/20 1112 01/02/20 1112 01/03/20 0631 01/04/20 0541 01/05/20 0501 01/06/20 0504 01/07/20 0926  WBC 26.0*   < > 23.1* 18.3* 12.9* 11.5* 11.5*  NEUTROABS 21.0*  --   --   --  10.0*  --   --   HGB 12.2   < > 10.4* 10.0* 9.4* 9.9* 9.7*  HCT 39.0   < > 33.2* 32.1* 29.7* 31.5* 30.6*  MCV 89.7   < > 90.5 91.7 89.2 90.3 88.4  PLT 273   < > 229 223 213 220 228   < > = values  in this interval not displayed.   Cardiac Enzymes: No results for input(s): CKTOTAL, CKMB, CKMBINDEX, TROPONINI in the last 168 hours. BNP: Invalid input(s): POCBNP CBG: Recent Labs  Lab 01/07/20 1130 01/07/20 1624 01/07/20 2209 01/08/20 0744 01/08/20 1132  GLUCAP 219* 148* 225* 130* 223*   D-Dimer No results for input(s): DDIMER in the last 72 hours. Hgb A1c No results for input(s): HGBA1C in the last 72 hours. Lipid Profile No results for input(s): CHOL, HDL, LDLCALC, TRIG, CHOLHDL, LDLDIRECT in the last 72 hours. Thyroid function studies No results for input(s): TSH, T4TOTAL, T3FREE, THYROIDAB in the last 72 hours.  Invalid input(s): FREET3 Anemia work up No results for input(s): VITAMINB12, FOLATE, FERRITIN, TIBC, IRON, RETICCTPCT in the last 72 hours. Urinalysis    Component Value Date/Time   COLORURINE YELLOW (A) 01/02/2020 1259   APPEARANCEUR CLEAR (A) 01/02/2020 1259   LABSPEC 1.025 01/02/2020 1259   PHURINE 5.0 01/02/2020 1259   GLUCOSEU 150 (A) 01/02/2020 1259   HGBUR NEGATIVE 01/02/2020 1259   BILIRUBINUR NEGATIVE 01/02/2020 1259   KETONESUR NEGATIVE 01/02/2020 1259   PROTEINUR 30 (A) 01/02/2020 1259   NITRITE NEGATIVE 01/02/2020 1259   LEUKOCYTESUR NEGATIVE 01/02/2020 1259   Sepsis Labs Invalid input(s): PROCALCITONIN,  WBC,  LACTICIDVEN Microbiology Recent Results (from the past 240 hour(s))  Blood culture (routine x 2)     Status: None   Collection Time: 01/02/20 12:05  PM   Specimen: BLOOD  Result Value Ref Range Status   Specimen Description BLOOD BLOOD LEFT ARM  Final   Special Requests   Final    BOTTLES DRAWN AEROBIC AND ANAEROBIC Blood Culture results may not be optimal due to an inadequate volume of blood received in culture bottles   Culture   Final    NO GROWTH 5 DAYS Performed at [facility], [address] [place], [place] [address]    Report Status 01/07/2020 FINAL  Final  Blood culture (routine x 2)     Status: None    Collection Time: 01/02/20 12:05 PM   Specimen: BLOOD  Result Value Ref Range Status   Specimen Description BLOOD BLOOD LEFT HAND  Final   Special Requests   Final    BOTTLES DRAWN AEROBIC AND ANAEROBIC Blood Culture results may not be optimal due to an inadequate volume of blood received in culture bottles   Culture   Final    NO GROWTH 5 DAYS Performed at [facility], [address] [place], [place] [address]    Report Status 01/07/2020 FINAL  Final  SARS CORONAVIRUS 2 (TAT 6-24 HRS) Nasopharyngeal Nasopharyngeal Swab     Status: None   Collection Time: 01/02/20  1:36 PM   Specimen: Nasopharyngeal Swab  Result Value Ref Range Status   SARS Coronavirus 2 NEGATIVE NEGATIVE Final    Comment: (NOTE) SARS-CoV-2 target nucleic acids are NOT DETECTED. The SARS-CoV-2 RNA is generally detectable in upper and lower respiratory specimens during the acute phase of infection. Negative results do not preclude SARS-CoV-2 infection, do not rule out co-infections with other pathogens, and should not be used as the sole basis for treatment or other patient management decisions. Negative results must be combined with clinical observations, patient history, and epidemiological information. The expected result is Negative. Fact Sheet for Patients: SugarRoll.be Fact Sheet for Healthcare Providers: https://www.woods-mathews.com/ This test is not yet approved or cleared by the [place] FDA and  has been authorized for detection and/or diagnosis of SARS-CoV-2 by FDA under an Emergency Use Authorization (EUA). This EUA will remain  in effect (meaning this test can be used) for the duration of the COVID-19 declaration under Section 56 4(b)(1) of the Act, 21 U.S.C. section 360bbb-3(b)(1), unless the authorization is terminated or revoked sooner. Performed at [facility], [place] [address], [place], [place] [address]    Sepsis due to  cellulitis of right [id number] meets critical for sepsis with leukocytosis, tachycardia, elevated lactic acid. Currently hemodynamically stable. Slowly improving Blood cultures negative to dateLactic acidosis resolved. Soft tissue ultrasound was obtained due to fluctuating edema of right distal forearm which was negative for any abscess. Venous Doppler of right upper extremity -negative for DVT. Was on vanco and rocephin  Leukocytosis.Patient has chronic leukocytosis which worsened with infection. She follow-up with hematology who were advising some recent work-up. Currently leukocytosis improving.  We will continue IV antibiotics  CKD (chronic kidney disease), stage IIIa:Stable. -Continue to monitor. -Avoid nephrotoxins.  Type II diabetes mellitus with renal manifestations (HCC):Last A1c 8.4 on 11/18/2019, poorly controlled. Patient is taking Metformin and Amaryl at home -SSI  Hyperlipidemia -Zocor  Pulmonary fibrosisand asthma:Stable -Bronchodilators,as needed Mucinex.  Hypertension.Blood pressure within goal. -Home dose of Cozaar was held due to concern of hypotension with sepsis. -We will resume Cozaar at 25 mg daily increase to home dose if needed.  Incidental finding of cystic pancreatic lesion.Patient has CT abdomen done during her prior admission in  February 2021 secondary to fall resulted in multiple rib fractures. 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 MRI with pancreatic protocol was advised. It was mentioned on her prior discharge summary. No mention during her follow-up visit with her PCP or hematologist. Patient was not aware of any findings. Discussed with radiologist and they were recommending outpatient pancreatic protocol MRI for follow-up.Will need to f/u with  PCP on discharge for further follow-up and management.  Time coordinating discharge: Over 30 minutes  SIGNED:   Nolberto Hanlon,  MD  [facility] 01/08/2020, 2:18 PM Pager   If 7PM-7AM, please contact night-coverage [email or web address] Password [email or web address]

## 2020-01-27 ENCOUNTER — Other Ambulatory Visit: Payer: Self-pay | Admitting: Student

## 2020-01-27 DIAGNOSIS — M12811 Other specific arthropathies, not elsewhere classified, right shoulder: Secondary | ICD-10-CM

## 2020-01-27 DIAGNOSIS — M7582 Other shoulder lesions, left shoulder: Secondary | ICD-10-CM

## 2020-01-27 DIAGNOSIS — M25521 Pain in right elbow: Secondary | ICD-10-CM

## 2020-02-09 ENCOUNTER — Other Ambulatory Visit: Payer: Self-pay | Admitting: Family Medicine

## 2020-02-09 DIAGNOSIS — R1084 Generalized abdominal pain: Secondary | ICD-10-CM

## 2020-02-09 DIAGNOSIS — K8689 Other specified diseases of pancreas: Secondary | ICD-10-CM

## 2020-02-11 ENCOUNTER — Ambulatory Visit
Admission: RE | Admit: 2020-02-11 | Discharge: 2020-02-11 | Disposition: A | Discharge status: Home / Self Care | Payer: Medicare Other | Source: Ambulatory Visit | Attending: Student | Admitting: Student

## 2020-02-11 ENCOUNTER — Other Ambulatory Visit: Payer: Self-pay

## 2020-02-11 DIAGNOSIS — M25521 Pain in right elbow: Secondary | ICD-10-CM | POA: Insufficient documentation

## 2020-02-11 DIAGNOSIS — M7582 Other shoulder lesions, left shoulder: Secondary | ICD-10-CM | POA: Diagnosis present

## 2020-02-11 DIAGNOSIS — M12811 Other specific arthropathies, not elsewhere classified, right shoulder: Secondary | ICD-10-CM

## 2020-02-25 ENCOUNTER — Other Ambulatory Visit: Payer: Self-pay

## 2020-02-25 ENCOUNTER — Ambulatory Visit
Admission: RE | Admit: 2020-02-25 | Discharge: 2020-02-25 | Disposition: A | Discharge status: Home / Self Care | Payer: Medicare Other | Source: Ambulatory Visit | Attending: Family Medicine | Admitting: Family Medicine

## 2020-02-25 ENCOUNTER — Other Ambulatory Visit: Payer: Self-pay | Admitting: Family Medicine

## 2020-02-25 DIAGNOSIS — R1084 Generalized abdominal pain: Secondary | ICD-10-CM | POA: Insufficient documentation

## 2020-02-25 DIAGNOSIS — K8689 Other specified diseases of pancreas: Secondary | ICD-10-CM

## 2020-02-25 MED ORDER — IOHEXOL 300 MG/ML  SOLN: 100.0000 mL | Freq: Once | INTRAMUSCULAR | Status: DC | PRN

## 2020-02-25 NOTE — Progress Notes (Signed)
Unsuccessful attempt to [facility] for PIV for CT.  CT RN to call MD to do scan without contrast due to pt anxiety with IV attempts. Accessed vein, pt jumped and pulled away yelling to take it out, at that time, vein blew and unusable.

## 2020-03-21 ENCOUNTER — Other Ambulatory Visit: Payer: Self-pay | Admitting: Internal Medicine

## 2020-03-21 DIAGNOSIS — Z1231 Encounter for screening mammogram for malignant neoplasm of breast: Secondary | ICD-10-CM

## 2020-03-23 ENCOUNTER — Ambulatory Visit: Payer: Medicare Other | Admitting: Internal Medicine

## 2020-03-23 ENCOUNTER — Ambulatory Visit: Payer: Medicare Other

## 2020-03-23 ENCOUNTER — Other Ambulatory Visit: Payer: Medicare Other

## 2020-03-25 ENCOUNTER — Inpatient Hospital Stay: Payer: Medicare Other

## 2020-03-25 ENCOUNTER — Inpatient Hospital Stay: Payer: Medicare Other | Admitting: Internal Medicine

## 2020-04-01 ENCOUNTER — Inpatient Hospital Stay: Payer: Medicare Other

## 2020-04-01 ENCOUNTER — Encounter: Payer: Self-pay | Admitting: Internal Medicine

## 2020-04-01 ENCOUNTER — Inpatient Hospital Stay: Payer: Medicare Other | Admitting: Internal Medicine

## 2020-04-01 ENCOUNTER — Telehealth: Payer: Self-pay | Admitting: Internal Medicine

## 2020-04-01 NOTE — Telephone Encounter (Signed)
Patient phoned on this date and stated that she was not feeling well and would not be able to attend her appt. Appts rescheduled to 04-04-20.

## 2020-04-01 NOTE — Progress Notes (Signed)
Patient called/ pre- screened for appoinment with oncologist. Expresses no concerns or complaints at this time.

## 2020-04-04 ENCOUNTER — Inpatient Hospital Stay: Payer: Medicare Other

## 2020-04-04 ENCOUNTER — Ambulatory Visit
Admission: RE | Admit: 2020-04-04 | Discharge: 2020-04-04 | Disposition: A | Discharge status: Home / Self Care | Payer: Medicare Other | Source: Ambulatory Visit | Attending: Internal Medicine | Admitting: Internal Medicine

## 2020-04-04 ENCOUNTER — Encounter: Payer: Self-pay | Admitting: Internal Medicine

## 2020-04-04 ENCOUNTER — Inpatient Hospital Stay: Payer: Medicare Other | Attending: Internal Medicine | Admitting: Internal Medicine

## 2020-04-04 ENCOUNTER — Other Ambulatory Visit: Payer: Self-pay

## 2020-04-04 DIAGNOSIS — N1832 Chronic kidney disease, stage 3b: Secondary | ICD-10-CM | POA: Insufficient documentation

## 2020-04-04 DIAGNOSIS — D649 Anemia, unspecified: Secondary | ICD-10-CM

## 2020-04-04 DIAGNOSIS — Z87891 Personal history of nicotine dependence: Secondary | ICD-10-CM | POA: Diagnosis not present

## 2020-04-04 DIAGNOSIS — D631 Anemia in chronic kidney disease: Secondary | ICD-10-CM | POA: Diagnosis present

## 2020-04-04 DIAGNOSIS — E669 Obesity, unspecified: Secondary | ICD-10-CM | POA: Diagnosis not present

## 2020-04-04 DIAGNOSIS — I129 Hypertensive chronic kidney disease with stage 1 through stage 4 chronic kidney disease, or unspecified chronic kidney disease: Secondary | ICD-10-CM | POA: Insufficient documentation

## 2020-04-04 DIAGNOSIS — D509 Iron deficiency anemia, unspecified: Secondary | ICD-10-CM | POA: Insufficient documentation

## 2020-04-04 DIAGNOSIS — E1122 Type 2 diabetes mellitus with diabetic chronic kidney disease: Secondary | ICD-10-CM | POA: Insufficient documentation

## 2020-04-04 DIAGNOSIS — Z1231 Encounter for screening mammogram for malignant neoplasm of breast: Secondary | ICD-10-CM | POA: Diagnosis present

## 2020-04-04 DIAGNOSIS — Z993 Dependence on wheelchair: Secondary | ICD-10-CM | POA: Insufficient documentation

## 2020-04-04 LAB — COMPREHENSIVE METABOLIC PANEL
ALT: 12 U/L (ref 0–44)
AST: 23 U/L (ref 15–41)
Albumin: 3.6 g/dL (ref 3.5–5.0)
Alkaline Phosphatase: 74 U/L (ref 38–126)
Anion gap: 15 (ref 5–15)
BUN: 20 mg/dL (ref 8–23)
CO2: 21 mmol/L — ABNORMAL LOW (ref 22–32)
Calcium: 9.2 mg/dL (ref 8.9–10.3)
Chloride: 103 mmol/L (ref 98–111)
Creatinine, Ser: 1.28 mg/dL — ABNORMAL HIGH (ref 0.44–1.00)
GFR calc Af Amer: 50 mL/min — ABNORMAL LOW (ref >60)
GFR calc non Af Amer: 44 mL/min — ABNORMAL LOW (ref >60)
Glucose, Bld: 149 mg/dL — ABNORMAL HIGH (ref 70–99)
Potassium: 3.7 mmol/L (ref 3.5–5.1)
Sodium: 139 mmol/L (ref 135–145)
Total Bilirubin: 0.8 mg/dL (ref 0.3–1.2)
Total Protein: 7.5 g/dL (ref 6.5–8.1)

## 2020-04-04 LAB — IRON AND TIBC
Iron: 38 ug/dL (ref 28–170)
Saturation Ratios: 11 % (ref 10.4–31.8)
TIBC: 335 ug/dL (ref 250–450)
UIBC: 297 ug/dL

## 2020-04-04 LAB — CBC WITH DIFFERENTIAL/PLATELET
Abs Immature Granulocytes: 0.11 10*3/uL — ABNORMAL HIGH (ref 0.00–0.07)
Basophils Absolute: 0.1 10*3/uL (ref 0.0–0.1)
Basophils Relative: 1 %
Eosinophils Absolute: 0.7 10*3/uL — ABNORMAL HIGH (ref 0.0–0.5)
Eosinophils Relative: 5 %
HCT: 36.9 % (ref 36.0–46.0)
Hemoglobin: 11.7 g/dL — ABNORMAL LOW (ref 12.0–15.0)
Immature Granulocytes: 1 %
Lymphocytes Relative: 20 %
Lymphs Abs: 2.4 10*3/uL (ref 0.7–4.0)
MCH: 28.1 pg (ref 26.0–34.0)
MCHC: 31.7 g/dL (ref 30.0–36.0)
MCV: 88.5 fL (ref 80.0–100.0)
Monocytes Absolute: 1 10*3/uL (ref 0.1–1.0)
Monocytes Relative: 9 %
Neutro Abs: 7.7 10*3/uL (ref 1.7–7.7)
Neutrophils Relative %: 64 %
Platelets: 348 10*3/uL (ref 150–400)
RBC: 4.17 MIL/uL (ref 3.87–5.11)
RDW: 16.4 % — ABNORMAL HIGH (ref 11.5–15.5)
WBC: 12 10*3/uL — ABNORMAL HIGH (ref 4.0–10.5)
nRBC: 0 % (ref 0.0–0.2)

## 2020-04-04 LAB — FERRITIN: Ferritin: 66 ng/mL (ref 11–307)

## 2020-04-04 NOTE — Progress Notes (Signed)
[name] NOTE  Patient Care Team: Tracie Harrier, MD as PCP - General (Internal Medicine)  CHIEF COMPLAINTS/PURPOSE OF CONSULTATION: Anemia   HEMATOLOGY HISTORY  #Chronic kidney disease-stage III/ ANEMIA: EGD [~10 y]/colonoscopy-postponed in april;[March 2021- Hb ~10.3; Iron sat- 15%] ; recommend PO iron; HOLD IV Iron.   # CKD [Dr.Lateef]-GFR 68s.    HISTORY OF PRESENTING ILLNESS:  [name] [name] 69 y.o.  female patient with iron deficient anemia-question CKD versus others is here for follow-up.  Patient patient states that she has been diligently taking iron pills once a day.  Fatigue improved.  Not resolved no major side effects noted.  No abdominal pain discomfort dyspepsia.   Review of Systems  Constitutional: Positive for malaise/fatigue. Negative for chills, diaphoresis, fever and weight loss.  HENT: Negative for nosebleeds and sore throat.   Eyes: Negative for double vision.  Respiratory: Negative for cough, hemoptysis, sputum production, shortness of breath and wheezing.   Cardiovascular: Negative for chest pain, palpitations, orthopnea and leg swelling.  Gastrointestinal: Negative for abdominal pain, blood in stool, constipation, diarrhea, heartburn, melena, nausea and vomiting.  Genitourinary: Negative for dysuria, frequency and urgency.  Musculoskeletal: Positive for back pain and joint pain.  Skin: Negative.  Negative for itching and rash.  Neurological: Negative for dizziness, tingling, focal weakness, weakness and headaches.  Endo/Heme/Allergies: Does not bruise/bleed easily.  Psychiatric/Behavioral: Negative for depression. The patient is not nervous/anxious and does not have insomnia.     MEDICAL HISTORY:  Past Medical History:  Diagnosis Date  . Anemia   . Asthma   . Chronic airway obstruction ([place])   . Diabetes mellitus without complication ([place])   . GERD (gastroesophageal reflux disease)   . History of adenomatous polyp of  colon   . History of bone density study   . Hyperlipidemia   . Hypertension   . Idiopathic peripheral neuropathy   . Morbid obesity ([place])   . Osteoporosis   . Plantar fascial fibromatosis   . Pulmonary fibrosis ([place])   . Sleep apnea    Hypersomnia with sleep apnea  . TIA (transient ischemic attack)     SURGICAL HISTORY: Past Surgical History:  Procedure Laterality Date  . ABDOMINAL HYSTERECTOMY    . BACK SURGERY    . BLADDER SUSPENSION    . CESAREAN SECTION    . COLONOSCOPY    . DILATION AND CURETTAGE OF UTERUS    . ESOPHAGOGASTRODUODENOSCOPY    . LUMBAR LAMINECTOMY/DECOMPRESSION MICRODISCECTOMY N/A 10/15/2016   Procedure: L3-L5 Laminectomy ;  Surgeon: Deetta Perla, MD;  Location: [facility] ORS;  Service: Neurosurgery;  Laterality: N/A;  . LUMBAR WOUND DEBRIDEMENT N/A 11/05/2016   Procedure: LUMBAR WOUND DEBRIDEMENT;  Surgeon: Deetta Perla, MD;  Location: [facility] ORS;  Service: Neurosurgery;  Laterality: N/A;  . TUBAL LIGATION      SOCIAL HISTORY: Social History   Socioeconomic History  . Marital status: Single    Spouse name: Not on file  . Number of children: Not on file  . Years of education: Not on file  . Highest education level: Not on file  Occupational History  . Not on file  Tobacco Use  . Smoking status: Former Research scientist (life sciences)  . Smokeless tobacco: Never Used  Vaping Use  . Vaping Use: Never used  Substance and Sexual Activity  . Alcohol use: No  . Drug use: No  . Sexual activity: Not on file  Other Topics Concern  . Not on file  Social History Narrative   [place]; walks with walker/gait instability; quit smoking 20 years ago; no alcohol.    Social Determinants of Health   Financial Resource Strain:   . Difficulty of Paying Living Expenses:   Food Insecurity:   . Worried About Charity fundraiser in the Last Year:   . Arboriculturist in the Last Year:   Transportation Needs:   . Film/video editor (Medical):   Marland Kitchen Lack of Transportation  (Non-Medical):   Physical Activity:   . Days of Exercise per Week:   . Minutes of Exercise per Session:   Stress:   . Feeling of Stress :   Social Connections:   . Frequency of Communication with Friends and Family:   . Frequency of Social Gatherings with Friends and Family:   . Attends Religious Services:   . Active Member of Clubs or Organizations:   . Attends Archivist Meetings:   Marland Kitchen Marital Status:   Intimate Partner Violence:   . Fear of Current or Ex-Partner:   . Emotionally Abused:   Marland Kitchen Physically Abused:   . Sexually Abused:     FAMILY HISTORY: Family History  Problem Relation Age of Onset  . Stroke Mother   . Heart attack Mother   . Breast cancer Sister 38  . Stroke Sister   . Heart attack Sister   . Breast cancer Cousin        maternal side 60's    ALLERGIES:  is allergic to acetaminophen-codeine, codeine, furosemide, penicillin v potassium, quinine, tramadol, and tylenol [acetaminophen].  MEDICATIONS:  Current Outpatient Medications  Medication Sig Dispense Refill  . albuterol (VENTOLIN HFA) 108 (90 Base) MCG/ACT inhaler Inhale 2 puffs into the lungs every 6 (six) hours as needed for wheezing or shortness of breath. 6.7 g 1  . allopurinol (ZYLOPRIM) 100 MG tablet Take 100 mg by mouth daily.    Marland Kitchen allopurinol (ZYLOPRIM) 100 MG tablet Take 1 tablet by mouth daily.    . budesonide-formoterol (SYMBICORT) 160-4.5 MCG/ACT inhaler Inhale 2 puffs into the lungs 2 (two) times daily.    . clotrimazole-betamethasone (LOTRISONE) cream Apply topically.    . cyclobenzaprine (FLEXERIL) 5 MG tablet Take 5 mg by mouth 2 (two) times daily as needed for muscle spasms.    . diclofenac Sodium (VOLTAREN) 1 % GEL Apply 2 g topically 4 (four) times daily as needed (rib cage pain). 100 g 0  . doxycycline (VIBRAMYCIN) 100 MG capsule Take 100 mg by mouth 2 (two) times daily.    . DULoxetine (CYMBALTA) 30 MG capsule Take 30 mg by mouth daily.    Marland Kitchen esomeprazole (NEXIUM) 40 MG  capsule Take 40 mg by mouth daily.     . ferrous sulfate 325 (65 FE) MG tablet Take 1 tablet (325 mg total) by mouth 2 (two) times daily with a meal. 60 tablet 1  . glimepiride (AMARYL) 2 MG tablet Take 2 mg by mouth every morning.    Marland Kitchen losartan (COZAAR) 25 MG tablet Take 2 tablets (50 mg total) by mouth daily. 30 tablet 0  . metFORMIN (GLUCOPHAGE) 500 MG tablet Take 500 mg by mouth 2 (two) times daily with a meal.    . mometasone-formoterol (DULERA) 100-5 MCG/ACT AERO Inhale into the lungs.    Marland Kitchen oxyCODONE (ROXICODONE) 5 MG immediate release tablet Take 1 tablet (5 mg total) by mouth every 4 (four) hours as needed for severe pain. (Patient taking differently: Take 5 mg by mouth 2 (two) times daily  as needed for severe pain. ) 30 tablet 0  . simvastatin (ZOCOR) 20 MG tablet Take 20 mg by mouth daily.    . predniSONE (DELTASONE) 2.5 MG tablet Take 2.5 mg by mouth daily.  (Patient not taking: Reported on 04/01/2020)     No current facility-administered medications for this visit.      PHYSICAL EXAMINATION:   Vitals:   04/04/20 1018  BP: 123/78  Pulse: (!) 112  Resp: 16  Temp: (!) 97.1 F (36.2 C)  SpO2: 100%   Filed Weights   04/04/20 1018  Weight: 197 lb (89.4 kg)    Physical Exam Constitutional:      Comments: Obese.  In a wheelchair.  HENT:     Head: Normocephalic and atraumatic.     Mouth/Throat:     Pharynx: No oropharyngeal exudate.  Eyes:     Pupils: Pupils are equal, round, and reactive to light.  Cardiovascular:     Rate and Rhythm: Normal rate and regular rhythm.  Pulmonary:     Effort: Pulmonary effort is normal. No respiratory distress.     Breath sounds: Normal breath sounds. No wheezing.  Abdominal:     General: Bowel sounds are normal. There is no distension.     Palpations: Abdomen is soft. There is no mass.     Tenderness: There is no abdominal tenderness. There is no guarding or rebound.  Musculoskeletal:        General: No tenderness. Normal range of  motion.     Cervical back: Normal range of motion and neck supple.  Skin:    General: Skin is warm.  Neurological:     Mental Status: She is alert and oriented to person, place, and time.  Psychiatric:        Mood and Affect: Affect normal.     LABORATORY DATA:  I have reviewed the data as listed Lab Results  Component Value Date   WBC 12.0 (H) 04/04/2020   HGB 11.7 (L) 04/04/2020   HCT 36.9 04/04/2020   MCV 88.5 04/04/2020   PLT 348 04/04/2020   Recent Labs    11/14/19 1040 11/14/19 1040 11/18/19 1005 11/19/19 0451 01/05/20 0501 01/06/20 0504 04/04/20 0958  NA 140   < > 141   < > 136 139 139  K 4.4   < > 4.6   < > 4.3 3.9 3.7  CL 104   < > 106   < > 107 110 103  CO2 22   < > 22   < > 20* 22 21*  GLUCOSE 209*   < > 174*   < > 170* 162* 149*  BUN 28*   < > 43*   < > 20 16 20   CREATININE 1.52*   < > 2.20*   < > 1.09* 0.97 1.28*  CALCIUM 9.2   < > 8.7*   < > 8.5* 8.6* 9.2  GFRNONAA 35*   < > 23*   < > 53* >60 44*  GFRAA 41*   < > 26*   < > >60 >60 50*  PROT 7.2  --  6.8  --   --   --  7.5  ALBUMIN 3.8  --  3.5  --   --   --  3.6  AST 23  --  22  --   --   --  23  ALT 19  --  16  --   --   --  12  ALKPHOS 82  --  73  --   --   --  74  BILITOT 1.0  --  0.6  --   --   --  0.8   < > = values in this interval not displayed.     No results found.  Anemia due to stage 3b chronic kidney disease    Normocytic anemia #Anemia secondary to chronic kidney disease-[nephrology office; March 2021]-iron saturation 15% hemoglobin 10.3; patient is minimally symptomatic.  On PO iron; improving.  Hold off  # ? Etiology-No obvious GI further evaluation follow up with GI re: scopes. Discussed with pt's daughter also.   # CKD- III-GFR 40-50s-diabetes/hypertension-followed by nephrology [Dr.Lateef].  # DISPOSITION: # No venofer today [? needles] # Follow up in 4 months- MD- labs- cbc/cmp/; Possible venofer--Dr.B   All questions were answered. The patient knows to call the  clinic with any problems, questions or concerns.    Cammie Sickle, MD 04/04/2020 12:30 PM

## 2020-04-04 NOTE — Assessment & Plan Note (Addendum)
#  Anemia secondary to chronic kidney disease-[nephrology office; March 2021]-iron saturation 15% hemoglobin 10.3; patient is minimally symptomatic.  On PO iron; improving.  Hold off  # ? Etiology-No obvious GI further evaluation follow up with GI re: scopes. Discussed with pt's daughter also.   # CKD- III-GFR 40-50s-diabetes/hypertension-followed by nephrology [Dr.Lateef].  # DISPOSITION: # No venofer today [? needles] # Follow up in 4 months- MD- labs- cbc/cmp/; Possible venofer--Dr.B

## 2020-04-05 LAB — MULTIPLE MYELOMA PANEL, SERUM
Albumin SerPl Elph-Mcnc: 3.3 g/dL (ref 2.9–4.4)
Albumin/Glob SerPl: 1.1 (ref 0.7–1.7)
Alpha 1: 0.3 g/dL (ref 0.0–0.4)
Alpha2 Glob SerPl Elph-Mcnc: 0.8 g/dL (ref 0.4–1.0)
B-Globulin SerPl Elph-Mcnc: 1.2 g/dL (ref 0.7–1.3)
Gamma Glob SerPl Elph-Mcnc: 0.9 g/dL (ref 0.4–1.8)
Globulin, Total: 3.2 g/dL (ref 2.2–3.9)
IgA: 219 mg/dL (ref 87–352)
IgG (Immunoglobin G), Serum: 905 mg/dL (ref 586–1602)
IgM (Immunoglobulin M), Srm: 128 mg/dL (ref 26–217)
Total Protein ELP: 6.5 g/dL (ref 6.0–8.5)

## 2020-04-05 LAB — KAPPA/LAMBDA LIGHT CHAINS
Kappa free light chain: 41.4 mg/L — ABNORMAL HIGH (ref 3.3–19.4)
Kappa, lambda light chain ratio: 1.57 (ref 0.26–1.65)
Lambda free light chains: 26.4 mg/L — ABNORMAL HIGH (ref 5.7–26.3)

## 2020-04-27 ENCOUNTER — Other Ambulatory Visit: Payer: Self-pay | Admitting: Surgery

## 2020-04-28 ENCOUNTER — Encounter: Payer: Self-pay | Admitting: Surgery

## 2020-04-28 ENCOUNTER — Other Ambulatory Visit: Payer: Self-pay

## 2020-04-28 ENCOUNTER — Encounter
Admission: RE | Admit: 2020-04-28 | Discharge: 2020-04-28 | Disposition: A | Discharge status: Home / Self Care | Payer: Medicare Other | Source: Ambulatory Visit | Attending: Surgery | Admitting: Surgery

## 2020-04-28 DIAGNOSIS — Z01812 Encounter for preprocedural laboratory examination: Secondary | ICD-10-CM | POA: Insufficient documentation

## 2020-04-28 HISTORY — DX: Chronic kidney disease, unspecified: N18.9

## 2020-04-28 NOTE — Pre-Procedure Instructions (Signed)
Erby Pian, MD - 01/28/2020 9:45 AM EDT Formatting of this note is different from the original.  Follow-up  History of Present Illness: [name] [name] is a 69 y.o. female presents to clinic for recheck. Status quo. No worse. Was at [facility] with seven broken ribs after a fall. symbicort was switch then to [facility] No cardac sxs, + trace edema.  Struggling to loose weight.   Current Medications:  Current Outpatient Medications  Medication Sig Dispense Refill  . albuterol 90 mcg/actuation inhaler Inhale 2 inhalations into the lungs every 6 (six) hours as needed for Wheezing 6.7 Inhaler 4  . alendronate (FOSAMAX) 70 MG tablet  . allopurinoL (ZYLOPRIM) 100 MG tablet TAKE 1 TABLET BY MOUTH EVERY DAY 90 tablet 1  . budesonide-formoteroL (SYMBICORT) 160-4.5 mcg/actuation inhaler Inhale 2 inhalations into the lungs 2 (two) times daily 3 Inhaler 1  . clotrimazole-betamethasone (LOTRISONE) 1-0.05 % cream Apply topically 2 (two) times daily 45 g 2  . cyclobenzaprine (FLEXERIL) 5 MG tablet Take 1 tablet (5 mg total) by mouth 2 (two) times daily as needed for Muscle spasms 60 tablet 5  . diclofenac (VOLTAREN) 1 % topical gel  . DULoxetine (CYMBALTA) 30 MG DR capsule TAKE 1 CAPSULE BY MOUTH EVERY DAY 90 capsule 1  . esomeprazole (NEXIUM) 40 MG DR capsule TAKE 1 CAPSULE BY MOUTH EVERY DAY 90 capsule 1  . ferrous sulfate 325 (65 FE) MG tablet Take by mouth  . glimepiride (AMARYL) 2 MG tablet TAKE 1 TABLET BY MOUTH EVERY DAY WITH BREAKFAST 90 tablet 1  . hydroCHLOROthiazide (MICROZIDE) 12.5 mg capsule Take by mouth  . iron polysaccharides (FERREX) 150 mg iron capsule Take 1 capsule (150 mg total) by mouth once daily 30 capsule 5  . losartan (COZAAR) 50 MG tablet TAKE 1 TABLET BY MOUTH EVERY DAY 90 tablet 1  . metFORMIN (GLUCOPHAGE) 500 MG tablet TAKE 1 TABLET BY MOUTH TWICE A DAY 180 tablet 1  . methylPREDNISolone (MEDROL DOSEPACK) 4 mg tablet Follow package directions. 21 tablet 0  . oxyCODONE  (ROXICODONE) 5 MG immediate release tablet Take 1 tablet (5 mg total) by mouth 2 (two) times daily as needed for Pain for up to 30 days 60 tablet 0  . simvastatin (ZOCOR) 20 MG tablet TAKE 1 TABLET BY MOUTH EVERY DAY EVERY NIGHT 90 tablet 1  . metFORMIN (GLUCOPHAGE) 1000 MG tablet Take 1 tablet (1,000 mg total) by mouth 2 (two) times daily with meals (Patient not taking: Reported on 11/27/2019 ) 60 tablet 5   No current facility-administered medications for this visit.   Problem List:  Patient Active Problem List  Diagnosis  . Obesity  . Hyperlipidemia  . DM2 (diabetes mellitus, type 2) (CMS-HCC)  . History of adenomatous polyp of colon  . DOE (dyspnea on exertion)  . Aortic stenosis  . Morbid obesity with BMI of 40.0-44.9, adult (CMS-HCC)  . Gastroesophageal reflux disease without esophagitis  . Right-sided low back pain with right-sided sciatica  . Low vitamin D level  . Complete tear of right rotator cuff  . Rotator cuff arthropathy, right  . Right lumbar radiculopathy  . Renal impairment  . CKD (chronic kidney disease) stage 3, GFR 30-59 ml/min  . Essential hypertension, benign  . Nonrheumatic aortic valve insufficiency  . Type 2 diabetes mellitus with stage 3 chronic kidney disease, without long-term current use of insulin (CMS-HCC)  . AVD (aortic valve disease)  . Chronic pain syndrome  . Chronic pain of right ankle  . Acute  renal failure superimposed on stage 3a chronic kidney disease (CMS-HCC)  . Asthma exacerbation, unspecified  . Closed rib fracture  . Fall  . SOB (shortness of breath) on exertion  . Pedal edema   History: Past Medical History:  Diagnosis Date  . Anemia  . Chronic airway obstruction, not elsewhere classified , unspecified (CMS-HCC)  . Essential hypertension, benign  . Gastroesophageal reflux disease without esophagitis 12/14/2014  . History of adenomatous polyp of colon 09/08/2014  . History of bone density study 09/19/2007  . Hypersomnia with  sleep apnea, unspecified  . Morbid obesity with BMI of 40.0-44.9, adult (CMS-HCC) 10/06/2014  . Obesity  . Osteoporosis, post-menopausal  . Other and unspecified hyperlipidemia  . Plantar fascial fibromatosis  . Pulmonary fibrosis (CMS-HCC)  . TIA (transient ischemic attack)  . Type II or unspecified type diabetes mellitus without mention of complication, not stated as uncontrolled (CMS-HCC)  Non-insulin dependent. PCP Dr. Ginette Pitman.  Marland Kitchen Unspecified hereditary and idiopathic peripheral neuropathy   Past Surgical History:  Procedure Laterality Date  . bladder suspension  . COLONOSCOPY 04/19/2008  Adenomatous Polyps: CBF 09/2011  . COLONOSCOPY 12/29/2014  Adenomatous Polyps: CBF 12/2017; Recall Ltr mailed 11/26/2017 (dh)  . DEBRIDEMENT SKIN/SUBCUTANEOUS TISSSUE N/A 11/16/2016  Procedure: DEBRIDEMENT, BACK; SUBCUTANEOUS TISSUE (INCLUDES EPIDERMIS AND DERMIS, IF PERFORMED); FIRST 20 SQ CM OR LESS; Surgeon: Malen Gauze, MD; Location: [facility]PERATING ROOMS; Service: Neurosurgery; Laterality: N/A;  . EGD 04/19/2008  No repeat per RTE  . EGD 12/29/2014  No repeat per RTE  . HYSTERECTOMY  . L3-5 Laminectomy 10/15/2016  Dr Deetta Perla  . SECONDARY CLOSURE SURGICAL WOUND/DEHISCENCE EXTENSIVE/COMPLICATED N/A 04/14/4127  Procedure: SECONDARY CLOSURE OF SURGICAL WOUND OR DEHISCENCE, EXTENSIVE OR COMPLICATED; Surgeon: Rocky Morel, MD; Location: [facility]PERATING RO[facility]S; Service: Plastic Surgery; Laterality: N/A;  . ulcer surgery  . vein stripping   Family History  Problem Relation Age of Onset  . Stroke Mother  . Myocardial Infarction (Heart attack) Mother  . No Known Problems Father  . Breast cancer Sister  . Stroke Sister  . Myocardial Infarction (Heart attack) Sister   Social History   Socioeconomic History  . Marital status: Single  Spouse name: Not on file  . Number of children: Not on file  . Years of education: Not on file  . Highest education level: Not on file  Occupational History   . Not on file  Tobacco Use  . Smoking status: Former Smoker  Types: Cigarettes  Quit date: 03/22/2000  Years since quitting: 19.8  . Smokeless tobacco: Never Used  Vaping Use  . Vaping Use: Never used  Substance and Sexual Activity  . Alcohol use: No  Alcohol/week: 0.0 standard drinks  . Drug use: No  . Sexual activity: Not Currently  Partners: Male  Other Topics Concern  . Not on file  Social History Narrative  . Not on file   Social Determinants of Health   Financial Resource Strain:  . Difficulty of Paying Living Expenses:  Food Insecurity:  . Worried About Charity fundraiser in the Last Year:  . Arboriculturist in the Last Year:  Transportation Needs:  . Film/video editor (Medical):  Marland Kitchen Lack of Transportation (Non-Medical):   Allergies:  Acetaminophen-codeine, Codeine, Lasix [furosemide], Qualaquin [quinine sulfate], Ultram [tramadol], and Penicillin v potassium  Review of Systems: As per above. Pretty much unchanged with the exception that her breathing is improved. No associated cardiopulmonary, GI, GU, dermatological symptoms today. No focal neurological symptoms or psychological changes.  Physical Exam: BP 126/84  Pulse 109  Wt 93.9 kg (207 lb)  SpO2 99%  BMI 41.81 kg/m 93.9 kg (207 lb) 99% General: NAD. Able to speak in complete sentences without cough or dyspnea, obese HEENT: Normocephalic, nontraumatic. Extraocular movements intact NECK: Supple. No JVD, nodes, thyromegaly CV: RRR no murmurs, gallops, rubs PULM: Normal respiratory effort, Clear to auscultation bilaterally without wheezing or crackles ABD: Soft, non tender EXTREMITIES: Trace edema, no cyanosis or Homans'signs SKIN: Fair turgor. No rashes LYMPHATIC: No nodes NEURO: No gross deficits PSYCH: Appropriate affect   Impression: Mild persistent asthma, over weight, need to loose weight.  Albuterol, low dose prednisone Dulera two puffs bid  Loose weight recommended and discussed.   Follow up in 4 months with spirometry    Electronically signed by Erby Pian, MD at 01/28/2020 10:48 AM EDT  Plan of Treatment - documented as of this encounter Upcoming Encounters Upcoming Encounters  Date Type Specialty Care Team Description  03/11/2020 Office Visit Gastroenterology Kathline Magic, MD  [address]  [place], [place] [address]  [phone]  [phone] ([address])    [facility] [place], [place]  Katheren Shams  [place], [place] [address]  [phone]  [phone] (Fax)    04/08/2020 Ancillary Orders Lab Azzie Glatter, MD  [address]  [place], [place] [address]  [phone]  [phone] ([phone]    04/15/2020 Office Visit Internal Medicine Azzie Glatter, MD  [address]  [place], [place] [address]  [phone]  [phone] ([phone]    06/23/2020 Office Visit Cardiology Isaias Cowman, MD  [place]  [facility]  [place], [place] [address]  [phone]  [phone] (Fax)    Goals - documented as of this encounter Goal Patient Goal Type Associated Problems Recent Progress Patient-Stated? Author  Blood Pressure < 130/80  Blood Pressure  On track (01/21/2019 10:48 AM EDT) No Yvonne Kendall, CMA  Note:   Formatting of this note might be different from the original. Current: 138/86   Follow my doctor's care plan  Lifestyle  On track (01/21/2019 10:48 AM EDT) No Yvonne Kendall, CMA  Note:   Formatting of this note might be different from the original. Follow my doctors treatment plan and follow-up as scheduled. Take all medications as prescribed and report any changes as necessary.    Lose Weight  Weight  Not on track (01/21/2019 10:48 AM EDT) No Yvonne Kendall, CMA  Note:   Formatting of this note might be different from the original. Ht:149.9 cm (4'  11") Wt:91.4 kg (201 lb 9.6 oz) HFS:FSEL surface area is 1.95 meters squared.  Goal weight: 190 lb Weight now:201 lb    Visit Diagnoses - documented in this encounter Diagnosis  Mild persistent asthma without complication - Primary   Weight disorder   Discontinued Medications - documented as of this encounter Medication Sig Discontinue Reason Start Date End Date  budesonide-formoteroL (SYMBICORT) 160-4.5 mcg/actuation inhaler  Inhale 2 inhalations into the lungs 2 (two) times daily Duplicate order 95/32/0233 01/28/2020  clotrimazole-betamethasone (LOTRISONE) 1-0.05 % cream  Apply topically 2 (two) times daily Reorder 05/25/2019 01/28/2020  Images Patient Demographics  Patient Address Communication Language Race / Ethnicity Marital Status  [address], [place] [address] [phone] [facility]) [phone] (Home) [email or web address] English (Preferred) Black or African American / Not Hispanic or Latino Single  Patient Contacts  Contact Name Contact Address Communication Relationship to Patient  Tiffney Nuttle Unknown  [phone] [facility]) Son or Daughter, Emergency Contact  Merrily Brittle Unknown [phone] [facility]) Son or Daughter, Emergency Contact  Document Information  Primary Care Provider Other Service Providers Document Coverage Dates  Rober Minion Lankin, MD (Nov. 09, 2016November 09, 2016 - Present) [phone] (Work) [phone] (Fax) [facility] [place], [place] [address] Internal Medicine [facility] [place], [place] [address]  Apr. 22, 2021April 22, 2021   [place] [address] [place], [place] [address]   Encounter Providers Encounter Date  Erby Pian, MD (Attending) [phone] (Work) [phone] (Fax) [place], [place] [address] Pulmonary Disease Apr. 22, 2021April 22, 2021    Show All  Sections

## 2020-04-28 NOTE — Patient Instructions (Addendum)
Your procedure is scheduled on: 05-05-20 THURSDAY Report to Same Day Surgery 2nd floor medical mall [facility] Entrance-take elevator on left to 2nd floor.  Check in with surgery information desk.) To find out your arrival time please call [phone] between 1PM - 3PM on 05-04-20 [facility]  Remember: Instructions that are not followed completely may result in serious medical risk, up to and including death, or upon the discretion of your surgeon and anesthesiologist your surgery may need to be rescheduled.    _x___ 1. Do not eat food after midnight the night before your procedure. NO GUM OR CANDY AFTER MIDNIGHT. You may drink WATER up to 2 hours before you are scheduled to arrive at the hospital for your procedure.  Do not drink WATER within 2 hours of your scheduled arrival to the hospital.  Type 1 and type 2 diabetics should only drink water.  ____GATORADE G2-FINISH DRINK 2 HOURS PRIOR TO ARRIVAL TIME TO HOSPITAL THE DAY OF YOUR SURGERY    __x__ 2. No Alcohol for 24 hours before or after surgery.   __x__3. No Smoking or e-cigarettes for 24 prior to surgery.  Do not use any chewable tobacco products for at least 6 hour prior to surgery   ____  4. Bring all medications with you on the day of surgery if instructed.    __x__ 5. Notify your doctor if there is any change in your medical condition     (cold, fever, infections).    x___6. On the morning of surgery brush your teeth with toothpaste and water.  You may rinse your mouth with mouth wash if you wish.  Do not swallow any toothpaste or mouthwash.   Do not wear jewelry, make-up, hairpins, clips or nail polish.  Do not wear lotions, powders, or perfumes.   Do not shave 48 hours prior to surgery. Men may shave face and neck.  Do not bring valuables to the hospital.    [facility] is not responsible for any belongings or valuables.               Contacts, dentures or bridgework may not be worn into surgery.  Leave your suitcase  in the car. After surgery it may be brought to your room.  For patients admitted to the hospital, discharge time is determined by your  treatment team.  _  Patients discharged the day of surgery will not be allowed to drive home.  You will need someone to drive you home and stay with you the night of your procedure.    Please read over the following fact sheets that you were given:   [facility] Preparing for Surgery/INCENTIVE SPIROMETER INSTRUCTIONS-YOU WILL BE GIVEN THE INCENTIVE SPIROMETER DEVICE THE DAY OF YOUR SURGERY  _x___ TAKE THE FOLLOWING MEDICATION THE MORNING OF SURGERY WITH A SMALL SIP OF WATER. These include:  1.  ALLOPURINOL (ZYLOPRIM)  2. CYMBALTA (DULOXETINE)  3. ZOCOR (SIMVASTATIN)  4. NEXIUM (ESOMEPRAZOLE)  5. TAKE AN EXTRA NEXIUM THE NIGHT BEFORE YOUR SURGERY  6.  ____Fleets enema or Magnesium Citrate as directed.   _x___ Use CHG WIPES/BENZOYL PEROXIDE GEL as directed on instruction sheet   ____ Use inhalers on the day of surgery and bring to hospital day of surgery  _X___ Stop Metformin 2 days prior to surgery-LAST DOSE ON 7-26 MONDAY    ____ Take 1/2 of usual insulin dose the night before surgery and none on the morning  surgery.   ____ Follow recommendations from Cardiologist, Pulmonologist or  PCP regarding  stopping Aspirin, Coumadin, Plavix ,Eliquis, Effient, or Pradaxa, and Pletal.  X____Stop Anti-inflammatories such as Advil, Aleve, Ibuprofen, Motrin, Naproxen, Naprosyn, Goodies powders or aspirin products NOW-OK to take Tylenol   ____ Stop supplements until after surgery.    ____ Bring C-Pap to the hospital.

## 2020-05-02 ENCOUNTER — Other Ambulatory Visit: Payer: Medicare Other

## 2020-05-02 ENCOUNTER — Ambulatory Visit: Payer: Medicare Other | Admitting: Podiatry

## 2020-05-03 ENCOUNTER — Encounter
Admission: RE | Admit: 2020-05-03 | Discharge: 2020-05-03 | Disposition: A | Discharge status: Home / Self Care | Payer: Medicare Other | Source: Ambulatory Visit | Attending: Surgery | Admitting: Surgery

## 2020-05-03 ENCOUNTER — Other Ambulatory Visit: Payer: Medicare Other

## 2020-05-03 ENCOUNTER — Other Ambulatory Visit: Payer: Self-pay

## 2020-05-03 DIAGNOSIS — Z01810 Encounter for preprocedural cardiovascular examination: Secondary | ICD-10-CM | POA: Insufficient documentation

## 2020-05-03 DIAGNOSIS — Z20822 Contact with and (suspected) exposure to covid-19: Secondary | ICD-10-CM | POA: Insufficient documentation

## 2020-05-03 DIAGNOSIS — Z01812 Encounter for preprocedural laboratory examination: Secondary | ICD-10-CM | POA: Insufficient documentation

## 2020-05-03 DIAGNOSIS — I4510 Unspecified right bundle-branch block: Secondary | ICD-10-CM | POA: Insufficient documentation

## 2020-05-03 DIAGNOSIS — I252 Old myocardial infarction: Secondary | ICD-10-CM | POA: Insufficient documentation

## 2020-05-03 LAB — CBC WITH DIFFERENTIAL/PLATELET
Abs Immature Granulocytes: 0.1 10*3/uL — ABNORMAL HIGH (ref 0.00–0.07)
Basophils Absolute: 0.1 10*3/uL (ref 0.0–0.1)
Basophils Relative: 1 %
Eosinophils Absolute: 0.9 10*3/uL — ABNORMAL HIGH (ref 0.0–0.5)
Eosinophils Relative: 8 %
HCT: 38.7 % (ref 36.0–46.0)
Hemoglobin: 12.4 g/dL (ref 12.0–15.0)
Immature Granulocytes: 1 %
Lymphocytes Relative: 20 %
Lymphs Abs: 2.4 10*3/uL (ref 0.7–4.0)
MCH: 29 pg (ref 26.0–34.0)
MCHC: 32 g/dL (ref 30.0–36.0)
MCV: 90.4 fL (ref 80.0–100.0)
Monocytes Absolute: 0.9 10*3/uL (ref 0.1–1.0)
Monocytes Relative: 8 %
Neutro Abs: 7.2 10*3/uL (ref 1.7–7.7)
Neutrophils Relative %: 62 %
Platelets: 307 10*3/uL (ref 150–400)
RBC: 4.28 MIL/uL (ref 3.87–5.11)
RDW: 16.2 % — ABNORMAL HIGH (ref 11.5–15.5)
WBC: 11.5 10*3/uL — ABNORMAL HIGH (ref 4.0–10.5)
nRBC: 0 % (ref 0.0–0.2)

## 2020-05-03 LAB — SARS CORONAVIRUS 2 (TAT 6-24 HRS): SARS Coronavirus 2: NEGATIVE

## 2020-05-03 LAB — COMPREHENSIVE METABOLIC PANEL
ALT: 11 U/L (ref 0–44)
AST: 20 U/L (ref 15–41)
Albumin: 3.7 g/dL (ref 3.5–5.0)
Alkaline Phosphatase: 82 U/L (ref 38–126)
Anion gap: 9 (ref 5–15)
BUN: 21 mg/dL (ref 8–23)
CO2: 22 mmol/L (ref 22–32)
Calcium: 9.4 mg/dL (ref 8.9–10.3)
Chloride: 106 mmol/L (ref 98–111)
Creatinine, Ser: 1.1 mg/dL — ABNORMAL HIGH (ref 0.44–1.00)
GFR calc Af Amer: >60 mL/min (ref >60)
GFR calc non Af Amer: 52 mL/min — ABNORMAL LOW (ref >60)
Glucose, Bld: 120 mg/dL — ABNORMAL HIGH (ref 70–99)
Potassium: 4.3 mmol/L (ref 3.5–5.1)
Sodium: 137 mmol/L (ref 135–145)
Total Bilirubin: 0.8 mg/dL (ref 0.3–1.2)
Total Protein: 7.1 g/dL (ref 6.5–8.1)

## 2020-05-03 LAB — SURGICAL PCR SCREEN
MRSA, PCR: NEGATIVE
Staphylococcus aureus: NEGATIVE

## 2020-05-05 ENCOUNTER — Other Ambulatory Visit: Payer: Self-pay

## 2020-05-05 ENCOUNTER — Inpatient Hospital Stay
Admission: RE | Admit: 2020-05-05 | Discharge: 2020-05-06 | DRG: 483 | Disposition: A | Discharge status: Home Health | Payer: Medicare Other | Attending: Surgery | Admitting: Surgery

## 2020-05-05 ENCOUNTER — Encounter
Admission: RE | Disposition: A | Discharge status: Home / Self Care | Payer: Self-pay | Source: Home / Self Care | Attending: Surgery

## 2020-05-05 ENCOUNTER — Encounter: Payer: Self-pay | Admitting: Surgery

## 2020-05-05 ENCOUNTER — Inpatient Hospital Stay: Payer: Medicare Other

## 2020-05-05 ENCOUNTER — Inpatient Hospital Stay: Payer: Medicare Other | Admitting: Anesthesiology

## 2020-05-05 ENCOUNTER — Inpatient Hospital Stay: Payer: Medicare Other | Admitting: Urgent Care

## 2020-05-05 DIAGNOSIS — Z823 Family history of stroke: Secondary | ICD-10-CM | POA: Diagnosis not present

## 2020-05-05 DIAGNOSIS — Z880 Allergy status to penicillin: Secondary | ICD-10-CM | POA: Diagnosis not present

## 2020-05-05 DIAGNOSIS — Z888 Allergy status to other drugs, medicaments and biological substances status: Secondary | ICD-10-CM

## 2020-05-05 DIAGNOSIS — G4733 Obstructive sleep apnea (adult) (pediatric): Secondary | ICD-10-CM | POA: Diagnosis present

## 2020-05-05 DIAGNOSIS — Z20822 Contact with and (suspected) exposure to covid-19: Secondary | ICD-10-CM | POA: Diagnosis present

## 2020-05-05 DIAGNOSIS — I129 Hypertensive chronic kidney disease with stage 1 through stage 4 chronic kidney disease, or unspecified chronic kidney disease: Secondary | ICD-10-CM | POA: Diagnosis present

## 2020-05-05 DIAGNOSIS — Z79899 Other long term (current) drug therapy: Secondary | ICD-10-CM

## 2020-05-05 DIAGNOSIS — Z803 Family history of malignant neoplasm of breast: Secondary | ICD-10-CM

## 2020-05-05 DIAGNOSIS — E1122 Type 2 diabetes mellitus with diabetic chronic kidney disease: Secondary | ICD-10-CM | POA: Diagnosis present

## 2020-05-05 DIAGNOSIS — Z8249 Family history of ischemic heart disease and other diseases of the circulatory system: Secondary | ICD-10-CM | POA: Diagnosis not present

## 2020-05-05 DIAGNOSIS — G4710 Hypersomnia, unspecified: Secondary | ICD-10-CM | POA: Diagnosis present

## 2020-05-05 DIAGNOSIS — M25519 Pain in unspecified shoulder: Secondary | ICD-10-CM

## 2020-05-05 DIAGNOSIS — K219 Gastro-esophageal reflux disease without esophagitis: Secondary | ICD-10-CM | POA: Diagnosis present

## 2020-05-05 DIAGNOSIS — Z6841 Body Mass Index (BMI) 40.0 and over, adult: Secondary | ICD-10-CM

## 2020-05-05 DIAGNOSIS — Z886 Allergy status to analgesic agent status: Secondary | ICD-10-CM | POA: Diagnosis not present

## 2020-05-05 DIAGNOSIS — E785 Hyperlipidemia, unspecified: Secondary | ICD-10-CM | POA: Diagnosis present

## 2020-05-05 DIAGNOSIS — N1832 Chronic kidney disease, stage 3b: Secondary | ICD-10-CM | POA: Diagnosis present

## 2020-05-05 DIAGNOSIS — Z7951 Long term (current) use of inhaled steroids: Secondary | ICD-10-CM

## 2020-05-05 DIAGNOSIS — J449 Chronic obstructive pulmonary disease, unspecified: Secondary | ICD-10-CM | POA: Diagnosis present

## 2020-05-05 DIAGNOSIS — Z87891 Personal history of nicotine dependence: Secondary | ICD-10-CM

## 2020-05-05 DIAGNOSIS — Z885 Allergy status to narcotic agent status: Secondary | ICD-10-CM | POA: Diagnosis not present

## 2020-05-05 DIAGNOSIS — Z7984 Long term (current) use of oral hypoglycemic drugs: Secondary | ICD-10-CM | POA: Diagnosis not present

## 2020-05-05 DIAGNOSIS — Z86010 Personal history of colonic polyps: Secondary | ICD-10-CM

## 2020-05-05 DIAGNOSIS — J8410 Pulmonary fibrosis, unspecified: Secondary | ICD-10-CM | POA: Diagnosis present

## 2020-05-05 DIAGNOSIS — M75121 Complete rotator cuff tear or rupture of right shoulder, not specified as traumatic: Principal | ICD-10-CM | POA: Diagnosis present

## 2020-05-05 DIAGNOSIS — Z8673 Personal history of transient ischemic attack (TIA), and cerebral infarction without residual deficits: Secondary | ICD-10-CM | POA: Diagnosis not present

## 2020-05-05 DIAGNOSIS — Z96611 Presence of right artificial shoulder joint: Secondary | ICD-10-CM

## 2020-05-05 HISTORY — DX: Dyspnea, unspecified: R06.00

## 2020-05-05 HISTORY — PX: REVERSE SHOULDER ARTHROPLASTY: SHX5054

## 2020-05-05 HISTORY — DX: Cardiac murmur, unspecified: R01.1

## 2020-05-05 LAB — URINALYSIS, COMPLETE (UACMP) WITH MICROSCOPIC
Bacteria, UA: NONE SEEN
Bilirubin Urine: NEGATIVE
Glucose, UA: 50 mg/dL — AB
Hgb urine dipstick: NEGATIVE
Ketones, ur: NEGATIVE mg/dL
Leukocytes,Ua: NEGATIVE
Nitrite: NEGATIVE
Protein, ur: NEGATIVE mg/dL
Specific Gravity, Urine: 1.018 (ref 1.005–1.030)
pH: 5 (ref 5.0–8.0)

## 2020-05-05 LAB — GLUCOSE, CAPILLARY
Glucose-Capillary: 148 mg/dL — ABNORMAL HIGH (ref 70–99)
Glucose-Capillary: 126 mg/dL — ABNORMAL HIGH (ref 70–99)
Glucose-Capillary: 146 mg/dL — ABNORMAL HIGH (ref 70–99)
Glucose-Capillary: 170 mg/dL — ABNORMAL HIGH (ref 70–99)

## 2020-05-05 LAB — TYPE AND SCREEN
ABO/RH(D): A POS
Antibody Screen: NEGATIVE

## 2020-05-05 SURGERY — ARTHROPLASTY, SHOULDER, TOTAL, REVERSE
Anesthesia: General | Site: Shoulder | Laterality: Right

## 2020-05-05 MED ORDER — KETOROLAC TROMETHAMINE 15 MG/ML IJ SOLN
15.0000 mg | Freq: Once | INTRAMUSCULAR | Status: AC
  Administered 2020-05-05: 15 mg via INTRAVENOUS

## 2020-05-05 MED ORDER — GLIMEPIRIDE 2 MG PO TABS
2.0000 mg | ORAL_TABLET | Freq: Every morning | ORAL | Status: DC
  Administered 2020-05-06: 2 mg via ORAL
  Filled 2020-05-05: qty 1

## 2020-05-05 MED ORDER — DULOXETINE HCL 30 MG PO CPEP
30.0000 mg | ORAL_CAPSULE | Freq: Every day | ORAL | Status: DC
  Administered 2020-05-06: 30 mg via ORAL
  Filled 2020-05-05: qty 1

## 2020-05-05 MED ORDER — CLINDAMYCIN PHOSPHATE 900 MG/50ML IV SOLN
INTRAVENOUS | Status: AC
  Filled 2020-05-05: qty 50

## 2020-05-05 MED ORDER — DROPERIDOL 2.5 MG/ML IJ SOLN: 0.6250 mg | Freq: Once | INTRAMUSCULAR | Status: DC | PRN

## 2020-05-05 MED ORDER — ENOXAPARIN SODIUM 40 MG/0.4ML ~~LOC~~ SOLN
40.0000 mg | SUBCUTANEOUS | Status: DC
  Administered 2020-05-06: 40 mg via SUBCUTANEOUS
  Filled 2020-05-05: qty 0.4

## 2020-05-05 MED ORDER — BISACODYL 10 MG RE SUPP: 10.0000 mg | Freq: Every day | RECTAL | Status: DC | PRN

## 2020-05-05 MED ORDER — BUPIVACAINE HCL (PF) 0.5 % IJ SOLN
INTRAMUSCULAR | Status: AC
  Filled 2020-05-05: qty 10

## 2020-05-05 MED ORDER — CLINDAMYCIN PHOSPHATE 600 MG/50ML IV SOLN
INTRAVENOUS | Status: AC
  Filled 2020-05-05: qty 50

## 2020-05-05 MED ORDER — BUPIVACAINE LIPOSOME 1.3 % IJ SUSP
INTRAMUSCULAR | Status: AC
  Filled 2020-05-05: qty 20

## 2020-05-05 MED ORDER — METOCLOPRAMIDE HCL 10 MG PO TABS: 5.0000 mg | ORAL_TABLET | Freq: Three times a day (TID) | ORAL | Status: DC | PRN

## 2020-05-05 MED ORDER — KETOROLAC TROMETHAMINE 15 MG/ML IJ SOLN
INTRAMUSCULAR | Status: AC
  Filled 2020-05-05: qty 1

## 2020-05-05 MED ORDER — LIDOCAINE HCL (PF) 2 % IJ SOLN
INTRAMUSCULAR | Status: AC
  Filled 2020-05-05: qty 5

## 2020-05-05 MED ORDER — MIDAZOLAM HCL 2 MG/2ML IJ SOLN: 1.0000 mg | Freq: Once | INTRAMUSCULAR | Status: AC

## 2020-05-05 MED ORDER — DIPHENHYDRAMINE HCL 12.5 MG/5ML PO ELIX: 12.5000 mg | ORAL_SOLUTION | ORAL | Status: DC | PRN

## 2020-05-05 MED ORDER — CYCLOBENZAPRINE HCL 10 MG PO TABS: 5.0000 mg | ORAL_TABLET | Freq: Two times a day (BID) | ORAL | Status: DC | PRN

## 2020-05-05 MED ORDER — CHLORHEXIDINE GLUCONATE 0.12 % MT SOLN
OROMUCOSAL | Status: AC
  Administered 2020-05-05: 15 mL via OROMUCOSAL
  Filled 2020-05-05: qty 15

## 2020-05-05 MED ORDER — MIDAZOLAM HCL 2 MG/2ML IJ SOLN
INTRAMUSCULAR | Status: AC
  Administered 2020-05-05: 1 mg via INTRAVENOUS
  Filled 2020-05-05: qty 2

## 2020-05-05 MED ORDER — METOCLOPRAMIDE HCL 5 MG/ML IJ SOLN: 5.0000 mg | Freq: Three times a day (TID) | INTRAMUSCULAR | Status: DC | PRN

## 2020-05-05 MED ORDER — CHLORHEXIDINE GLUCONATE 0.12 % MT SOLN: 15.0000 mL | Freq: Once | OROMUCOSAL | Status: AC

## 2020-05-05 MED ORDER — INSULIN ASPART 100 UNIT/ML ~~LOC~~ SOLN
0.0000 [IU] | Freq: Three times a day (TID) | SUBCUTANEOUS | Status: DC
  Administered 2020-05-05: 2 [IU] via SUBCUTANEOUS
  Filled 2020-05-05: qty 1

## 2020-05-05 MED ORDER — MOMETASONE FURO-FORMOTEROL FUM 100-5 MCG/ACT IN AERO
2.0000 | INHALATION_SPRAY | Freq: Every day | RESPIRATORY_TRACT | Status: DC | PRN
  Administered 2020-05-05: 2 via RESPIRATORY_TRACT
  Filled 2020-05-05 (×2): qty 8.8

## 2020-05-05 MED ORDER — ROCURONIUM BROMIDE 100 MG/10ML IV SOLN
INTRAVENOUS | Status: DC | PRN
  Administered 2020-05-05: 50 mg via INTRAVENOUS

## 2020-05-05 MED ORDER — PROPOFOL 10 MG/ML IV BOLUS
INTRAVENOUS | Status: AC
  Filled 2020-05-05: qty 20

## 2020-05-05 MED ORDER — PHENYLEPHRINE HCL (PRESSORS) 10 MG/ML IV SOLN
INTRAVENOUS | Status: AC
  Filled 2020-05-05: qty 1

## 2020-05-05 MED ORDER — ONDANSETRON HCL 4 MG/2ML IJ SOLN: 4.0000 mg | Freq: Four times a day (QID) | INTRAMUSCULAR | Status: DC | PRN

## 2020-05-05 MED ORDER — ACETAMINOPHEN 10 MG/ML IV SOLN
INTRAVENOUS | Status: DC | PRN
  Administered 2020-05-05: 1000 mg via INTRAVENOUS

## 2020-05-05 MED ORDER — SEVOFLURANE IN SOLN
RESPIRATORY_TRACT | Status: AC
  Filled 2020-05-05: qty 250

## 2020-05-05 MED ORDER — LIDOCAINE HCL (CARDIAC) PF 100 MG/5ML IV SOSY
PREFILLED_SYRINGE | INTRAVENOUS | Status: DC | PRN
  Administered 2020-05-05: 20 mg via INTRAVENOUS

## 2020-05-05 MED ORDER — GLYCOPYRROLATE 0.2 MG/ML IJ SOLN
INTRAMUSCULAR | Status: AC
  Filled 2020-05-05: qty 1

## 2020-05-05 MED ORDER — FENTANYL CITRATE (PF) 100 MCG/2ML IJ SOLN
INTRAMUSCULAR | Status: AC
  Administered 2020-05-05: 50 ug via INTRAVENOUS
  Filled 2020-05-05: qty 2

## 2020-05-05 MED ORDER — SODIUM CHLORIDE 0.9 % IV SOLN: INTRAVENOUS | Status: DC

## 2020-05-05 MED ORDER — ALLOPURINOL 100 MG PO TABS
100.0000 mg | ORAL_TABLET | ORAL | Status: DC
  Administered 2020-05-06: 100 mg via ORAL
  Filled 2020-05-05: qty 1

## 2020-05-05 MED ORDER — PROMETHAZINE HCL 25 MG/ML IJ SOLN: 6.2500 mg | INTRAMUSCULAR | Status: DC | PRN

## 2020-05-05 MED ORDER — TRANEXAMIC ACID 1000 MG/10ML IV SOLN
INTRAVENOUS | Status: AC
  Filled 2020-05-05: qty 10

## 2020-05-05 MED ORDER — GLYCOPYRROLATE 0.2 MG/ML IJ SOLN
INTRAMUSCULAR | Status: DC | PRN
  Administered 2020-05-05: .2 mg via INTRAVENOUS

## 2020-05-05 MED ORDER — ONDANSETRON HCL 4 MG/2ML IJ SOLN
INTRAMUSCULAR | Status: DC | PRN
  Administered 2020-05-05: 4 mg via INTRAVENOUS

## 2020-05-05 MED ORDER — SUGAMMADEX SODIUM 200 MG/2ML IV SOLN
INTRAVENOUS | Status: DC | PRN
  Administered 2020-05-05: 200 mg via INTRAVENOUS

## 2020-05-05 MED ORDER — DOCUSATE SODIUM 100 MG PO CAPS
100.0000 mg | ORAL_CAPSULE | Freq: Two times a day (BID) | ORAL | Status: DC
  Filled 2020-05-05 (×2): qty 1

## 2020-05-05 MED ORDER — FENTANYL CITRATE (PF) 100 MCG/2ML IJ SOLN: 50.0000 ug | Freq: Once | INTRAMUSCULAR | Status: AC

## 2020-05-05 MED ORDER — ACETAMINOPHEN 10 MG/ML IV SOLN
INTRAVENOUS | Status: AC
  Filled 2020-05-05: qty 100

## 2020-05-05 MED ORDER — BUPIVACAINE-EPINEPHRINE (PF) 0.5% -1:200000 IJ SOLN
INTRAMUSCULAR | Status: DC | PRN
  Administered 2020-05-05: 30 mL

## 2020-05-05 MED ORDER — BUPIVACAINE LIPOSOME 1.3 % IJ SUSP
INTRAMUSCULAR | Status: DC | PRN
Start: 2020-05-05 — End: 2020-05-05
  Administered 2020-05-05: 15 mL via PERINEURAL

## 2020-05-05 MED ORDER — CLINDAMYCIN PHOSPHATE 900 MG/50ML IV SOLN
900.0000 mg | INTRAVENOUS | Status: AC
  Administered 2020-05-05: 900 mg via INTRAVENOUS

## 2020-05-05 MED ORDER — CLOTRIMAZOLE-BETAMETHASONE 1-0.05 % EX CREA
1.0000 "application " | TOPICAL_CREAM | Freq: Two times a day (BID) | CUTANEOUS | Status: DC | PRN
  Filled 2020-05-05: qty 15

## 2020-05-05 MED ORDER — BUPIVACAINE HCL (PF) 0.5 % IJ SOLN
INTRAMUSCULAR | Status: DC | PRN
Start: 2020-05-05 — End: 2020-05-05
  Administered 2020-05-05: 5 mL via PERINEURAL

## 2020-05-05 MED ORDER — ONDANSETRON HCL 4 MG PO TABS: 4.0000 mg | ORAL_TABLET | Freq: Four times a day (QID) | ORAL | Status: DC | PRN

## 2020-05-05 MED ORDER — MEPERIDINE HCL 50 MG/ML IJ SOLN: 6.2500 mg | INTRAMUSCULAR | Status: DC | PRN

## 2020-05-05 MED ORDER — ORAL CARE MOUTH RINSE: 15.0000 mL | Freq: Once | OROMUCOSAL | Status: AC

## 2020-05-05 MED ORDER — ONDANSETRON HCL 4 MG/2ML IJ SOLN
INTRAMUSCULAR | Status: AC
  Filled 2020-05-05: qty 2

## 2020-05-05 MED ORDER — CLINDAMYCIN PHOSPHATE 600 MG/50ML IV SOLN
600.0000 mg | Freq: Four times a day (QID) | INTRAVENOUS | Status: AC
  Administered 2020-05-05 – 2020-05-06 (×3): 600 mg via INTRAVENOUS
  Filled 2020-05-05 (×2): qty 50

## 2020-05-05 MED ORDER — TRANEXAMIC ACID 1000 MG/10ML IV SOLN
INTRAVENOUS | Status: DC | PRN
  Administered 2020-05-05: 1000 mg via TOPICAL

## 2020-05-05 MED ORDER — PHENYLEPHRINE HCL (PRESSORS) 10 MG/ML IV SOLN
INTRAVENOUS | Status: DC | PRN
  Administered 2020-05-05: 100 ug via INTRAVENOUS

## 2020-05-05 MED ORDER — KETOROLAC TROMETHAMINE 15 MG/ML IJ SOLN
7.5000 mg | Freq: Four times a day (QID) | INTRAMUSCULAR | Status: DC
  Administered 2020-05-05 – 2020-05-06 (×3): 7.5 mg via INTRAVENOUS
  Filled 2020-05-05 (×3): qty 1

## 2020-05-05 MED ORDER — LOSARTAN POTASSIUM 50 MG PO TABS
50.0000 mg | ORAL_TABLET | Freq: Every day | ORAL | Status: DC
  Administered 2020-05-06: 50 mg via ORAL
  Filled 2020-05-05: qty 1

## 2020-05-05 MED ORDER — MAGNESIUM HYDROXIDE 400 MG/5ML PO SUSP: 30.0000 mL | Freq: Every day | ORAL | Status: DC | PRN

## 2020-05-05 MED ORDER — SIMVASTATIN 20 MG PO TABS
20.0000 mg | ORAL_TABLET | Freq: Every day | ORAL | Status: DC
  Administered 2020-05-06: 20 mg via ORAL
  Filled 2020-05-05 (×2): qty 1

## 2020-05-05 MED ORDER — LIDOCAINE HCL (PF) 1 % IJ SOLN
INTRAMUSCULAR | Status: AC
  Filled 2020-05-05: qty 5

## 2020-05-05 MED ORDER — FLEET ENEMA 7-19 GM/118ML RE ENEM: 1.0000 | ENEMA | Freq: Once | RECTAL | Status: DC | PRN

## 2020-05-05 MED ORDER — PROPOFOL 10 MG/ML IV BOLUS
INTRAVENOUS | Status: DC | PRN
  Administered 2020-05-05: 50 mg via INTRAVENOUS
  Administered 2020-05-05: 100 mg via INTRAVENOUS

## 2020-05-05 MED ORDER — ROCURONIUM BROMIDE 10 MG/ML (PF) SYRINGE
PREFILLED_SYRINGE | INTRAVENOUS | Status: AC
  Filled 2020-05-05: qty 10

## 2020-05-05 MED ORDER — ACETAMINOPHEN 325 MG PO TABS: 325.0000 mg | ORAL_TABLET | Freq: Four times a day (QID) | ORAL | Status: DC | PRN

## 2020-05-05 MED ORDER — BUPIVACAINE-EPINEPHRINE (PF) 0.5% -1:200000 IJ SOLN
INTRAMUSCULAR | Status: AC
  Filled 2020-05-05: qty 30

## 2020-05-05 MED ORDER — FERROUS SULFATE 325 (65 FE) MG PO TABS
325.0000 mg | ORAL_TABLET | Freq: Every day | ORAL | Status: DC
  Administered 2020-05-06: 325 mg via ORAL
  Filled 2020-05-05: qty 1

## 2020-05-05 MED ORDER — SODIUM CHLORIDE 0.9 % IV SOLN
INTRAVENOUS | Status: DC | PRN
  Administered 2020-05-05: 25 ug/min via INTRAVENOUS

## 2020-05-05 MED ORDER — METFORMIN HCL 500 MG PO TABS
500.0000 mg | ORAL_TABLET | Freq: Every day | ORAL | Status: DC
  Administered 2020-05-06: 500 mg via ORAL
  Filled 2020-05-05: qty 1

## 2020-05-05 MED ORDER — ACETAMINOPHEN 500 MG PO TABS
1000.0000 mg | ORAL_TABLET | Freq: Four times a day (QID) | ORAL | Status: DC
  Administered 2020-05-05 – 2020-05-06 (×3): 1000 mg via ORAL
  Filled 2020-05-05 (×3): qty 2

## 2020-05-05 MED ORDER — OXYCODONE HCL 5 MG PO TABS: 5.0000 mg | ORAL_TABLET | ORAL | Status: DC | PRN

## 2020-05-05 MED ORDER — PANTOPRAZOLE SODIUM 40 MG PO TBEC
40.0000 mg | DELAYED_RELEASE_TABLET | Freq: Every day | ORAL | Status: DC
  Administered 2020-05-06: 40 mg via ORAL
  Filled 2020-05-05: qty 1

## 2020-05-05 MED ORDER — FENTANYL CITRATE (PF) 100 MCG/2ML IJ SOLN
INTRAMUSCULAR | Status: AC
  Filled 2020-05-05: qty 2

## 2020-05-05 MED ORDER — SODIUM CHLORIDE FLUSH 0.9 % IV SOLN
INTRAVENOUS | Status: AC
  Filled 2020-05-05: qty 40

## 2020-05-05 MED ORDER — HYDROMORPHONE HCL 1 MG/ML IJ SOLN: 0.2500 mg | INTRAMUSCULAR | Status: DC | PRN

## 2020-05-05 SURGICAL SUPPLY — 73 items
APL PRP STRL LF DISP 70% ISPRP (MISCELLANEOUS) ×1
BASEPLATE GLENOSPHERE 25 (Plate) ×1 IMPLANT
BEARING HUMERAL SHLDER 36M STD (Shoulder) IMPLANT
BIT DRILL TWIST 2.7 (BIT) ×1 IMPLANT
BLADE SAW SAG 25X90X1.19 (BLADE) ×2 IMPLANT
BRNG HUM STD 36 RVRS SHLDR (Shoulder) ×1 IMPLANT
CANISTER SUCT 1200ML W/VALVE (MISCELLANEOUS) ×2 IMPLANT
CANISTER SUCT 3000ML PPV (MISCELLANEOUS) ×4 IMPLANT
CHLORAPREP W/TINT 26 (MISCELLANEOUS) ×2 IMPLANT
COOLER POLAR GLACIER W/PUMP (MISCELLANEOUS) ×2 IMPLANT
COVER BACK TABLE REUSABLE LG (DRAPES) ×2 IMPLANT
COVER WAND RF STERILE (DRAPES) ×2 IMPLANT
DRAPE 3/4 80X56 (DRAPES) ×4 IMPLANT
DRAPE IMP U-DRAPE 54X76 (DRAPES) ×4 IMPLANT
DRAPE INCISE IOBAN 66X45 STRL (DRAPES) ×4 IMPLANT
DRSG OPSITE POSTOP 4X12 (GAUZE/BANDAGES/DRESSINGS) ×1 IMPLANT
DRSG OPSITE POSTOP 4X8 (GAUZE/BANDAGES/DRESSINGS) ×2 IMPLANT
ELECT BLADE 6.5 EXT (BLADE) IMPLANT
ELECT CAUTERY BLADE 6.4 (BLADE) ×2 IMPLANT
GLENOID SPHERE STD STRL 36MM (Orthopedic Implant) ×1 IMPLANT
GLOVE BIO SURGEON STRL SZ7.5 (GLOVE) ×8 IMPLANT
GLOVE BIO SURGEON STRL SZ8 (GLOVE) ×8 IMPLANT
GLOVE BIOGEL PI IND STRL 8 (GLOVE) ×1 IMPLANT
GLOVE BIOGEL PI INDICATOR 8 (GLOVE) ×1
GLOVE INDICATOR 8.0 STRL GRN (GLOVE) ×2 IMPLANT
GOWN STRL REUS W/ TWL LRG LVL3 (GOWN DISPOSABLE) ×1 IMPLANT
GOWN STRL REUS W/ TWL XL LVL3 (GOWN DISPOSABLE) ×1 IMPLANT
GOWN STRL REUS W/TWL LRG LVL3 (GOWN DISPOSABLE) ×2
GOWN STRL REUS W/TWL XL LVL3 (GOWN DISPOSABLE) ×2
HOOD PEEL AWAY FLYTE STAYCOOL (MISCELLANEOUS) ×7 IMPLANT
ILLUMINATOR WAVEGUIDE N/F (MISCELLANEOUS) ×1 IMPLANT
KIT STABILIZATION SHOULDER (MISCELLANEOUS) ×2 IMPLANT
KIT TURNOVER KIT A (KITS) ×2 IMPLANT
MASK FACE SPIDER DISP (MASK) ×2 IMPLANT
MAT ABSORB  FLUID 56X50 GRAY (MISCELLANEOUS) ×2
MAT ABSORB FLUID 56X50 GRAY (MISCELLANEOUS) ×1 IMPLANT
NDL SAFETY ECLIPSE 18X1.5 (NEEDLE) ×1 IMPLANT
NDL SPNL 20GX3.5 QUINCKE YW (NEEDLE) ×1 IMPLANT
NEEDLE HYPO 18GX1.5 SHARP (NEEDLE) ×2
NEEDLE HYPO 22GX1.5 SAFETY (NEEDLE) ×2 IMPLANT
NEEDLE SPNL 20GX3.5 QUINCKE YW (NEEDLE) ×2 IMPLANT
NS IRRIG 500ML POUR BTL (IV SOLUTION) ×2 IMPLANT
PACK SHDR ARTHRO (MISCELLANEOUS) ×2 IMPLANT
PAD ARMBOARD 7.5X6 YLW CONV (MISCELLANEOUS) ×2 IMPLANT
PAD WRAPON POLAR SHDR UNIV (MISCELLANEOUS) ×1 IMPLANT
PENCIL SMOKE EVACUATOR (MISCELLANEOUS) ×2 IMPLANT
PIN THREADED REVERSE (PIN) ×1 IMPLANT
PULSAVAC PLUS IRRIG FAN TIP (DISPOSABLE) ×2
SCREW BONE LOCKING 4.75X35X3.5 (Screw) ×1 IMPLANT
SCREW BONE STRL 6.5MMX25MM (Screw) ×1 IMPLANT
SCREW LOCKING STRL 4.75X25X3.5 (Screw) ×2 IMPLANT
SCREW NON-LOCK 4.75MMX15MM (Screw) ×1 IMPLANT
SCREW NON-LOCK 4.75X20X3.5 (Screw) ×1 IMPLANT
SHOULDER HUMERAL BEAR 36M STD (Shoulder) ×2 IMPLANT
SLING ULTRA II M (MISCELLANEOUS) ×2 IMPLANT
SOL .9 NS 3000ML IRR  AL (IV SOLUTION) ×2
SOL .9 NS 3000ML IRR AL (IV SOLUTION) ×1
SOL .9 NS 3000ML IRR UROMATIC (IV SOLUTION) ×1 IMPLANT
SPONGE LAP 18X18 RF (DISPOSABLE) ×2 IMPLANT
STAPLER SKIN PROX 35W (STAPLE) ×2 IMPLANT
STEM HUMERAL STRL 10MMX55MM (Stem) ×1 IMPLANT
SUT ETHIBOND 0 MO6 C/R (SUTURE) ×2 IMPLANT
SUT FIBERWIRE #2 38 BLUE 1/2 (SUTURE) ×8
SUT VIC AB 0 CT1 36 (SUTURE) ×2 IMPLANT
SUT VIC AB 2-0 CT1 27 (SUTURE) ×4
SUT VIC AB 2-0 CT1 TAPERPNT 27 (SUTURE) ×2 IMPLANT
SUTURE FIBERWR #2 38 BLUE 1/2 (SUTURE) ×4 IMPLANT
SYR 10ML LL (SYRINGE) ×2 IMPLANT
SYR 30ML LL (SYRINGE) IMPLANT
TIP FAN IRRIG PULSAVAC PLUS (DISPOSABLE) ×1 IMPLANT
TRAY HUM MINI SHOULDER +0 40D (Shoulder) ×1 IMPLANT
WRAP SHOULDER HOT/COLD PACK (SOFTGOODS) IMPLANT
WRAPON POLAR PAD SHDR UNIV (MISCELLANEOUS) ×2

## 2020-05-05 NOTE — Anesthesia Procedure Notes (Signed)
Procedure Name: Intubation Date/Time: 05/05/2020 7:37 AM Performed by: Lerry Liner, CRNA Pre-anesthesia Checklist: Patient identified, Emergency Drugs available, Suction available, Patient being monitored and Timeout performed Patient Re-evaluated:Patient Re-evaluated prior to induction Oxygen Delivery Method: Circle system utilized Preoxygenation: Pre-oxygenation with 100% oxygen Induction Type: IV induction Ventilation: Mask ventilation without difficulty Laryngoscope Size: McGraph and 3 Grade View: Grade I Tube type: Oral Tube size: 6.5 mm Number of attempts: 1 Airway Equipment and Method: Stylet Placement Confirmation: ETT inserted through vocal cords under direct vision,  positive ETCO2 and breath sounds checked- equal and bilateral Secured at: 21 cm Tube secured with: Tape Dental Injury: Teeth and Oropharynx as per pre-operative assessment

## 2020-05-05 NOTE — H&P (Signed)
History of Present Illness: [name] [name] is a 69 y.o. female who presents today to undergo a left shoulder steroid injection in addition to a surgical history and physical for her upcoming right reverse total shoulder arthroplasty. Surgery scheduled Dr. Roland Rack on 05/05/2020. The patient denies any changes in her medical history since she was last evaluated. She denies any numbness or tingling to bilateral upper extremities. She reports significant difficulty with reaching above her head with bilateral arms. She states that the pain in the right shoulder is worse than the left. She denies any personal history of heart attack or stroke. She does have a history of a TIA and also does have a history of COPD. Denies any history of DVT. She denies any falls or trauma affecting her shoulder since she was last evaluated.  Past Medical History: . Anemia  . Chronic airway obstruction, not elsewhere classified , unspecified (CMS-HCC)  . Essential hypertension, benign  . Gastroesophageal reflux disease without esophagitis 12/14/2014  . History of adenomatous polyp of colon 09/08/2014  . History of bone density study 09/19/2007  . Hypersomnia with sleep apnea, unspecified  . Morbid obesity with BMI of 40.0-44.9, adult (CMS-HCC) 10/06/2014  . Obesity  . Osteoporosis, post-menopausal  . Other and unspecified hyperlipidemia  . Plantar fascial fibromatosis  . Pulmonary fibrosis (CMS-HCC)  . TIA (transient ischemic attack)  . Type II or unspecified type diabetes mellitus without mention of complication, not stated as uncontrolled (CMS-HCC)  Non-insulin dependent. PCP Dr. Ginette Pitman.  Marland Kitchen Unspecified hereditary and idiopathic peripheral neuropathy   Past Surgical History: . bladder suspension  . COLONOSCOPY 04/19/2008  Adenomatous Polyps: CBF 09/2011  . COLONOSCOPY 12/29/2014  Adenomatous Polyps: CBF 12/2017; Recall Ltr mailed 11/26/2017 (dh)  . DEBRIDEMENT SKIN/SUBCUTANEOUS TISSSUE N/A 11/16/2016  Procedure: DEBRIDEMENT,  BACK; SUBCUTANEOUS TISSUE (INCLUDES EPIDERMIS AND DERMIS, IF PERFORMED); FIRST 20 SQ CM OR LESS; Surgeon: Malen Gauze, MD; Location: [facility] OPERATING ROOMS; Service: Neurosurgery; Laterality: N/A;  . EGD 04/19/2008  No repeat per RTE  . EGD 12/29/2014  No repeat per RTE  . HYSTERECTOMY  . L3-5 Laminectomy 10/15/2016  Dr Deetta Perla  . SECONDARY CLOSURE SURGICAL WOUND/DEHISCENCE EXTENSIVE/COMPLICATED N/A 02/07/9766  Procedure: SECONDARY CLOSURE OF SURGICAL WOUND OR DEHISCENCE, EXTENSIVE OR COMPLICATED; Surgeon: Rocky Morel, MD; Location: [facility] OPERATING ROOMS; Service: Plastic Surgery; Laterality: N/A;  . ulcer surgery  . vein stripping   Past Family History: . Stroke Mother  . Myocardial Infarction (Heart attack) Mother  . No Known Problems Father  . Breast cancer Sister  . Stroke Sister  . Myocardial Infarction (Heart attack) Sister   Medications: . albuterol 90 mcg/actuation inhaler Inhale 2 inhalations into the lungs every 6 (six) hours as needed for Wheezing 6.7 Inhaler 4  . alendronate (FOSAMAX) 70 MG tablet Take 70 mg by mouth every 7 (seven) days  . allopurinoL (ZYLOPRIM) 100 MG tablet TAKE 1 TABLET BY MOUTH EVERY DAY 90 tablet 1  . clotrimazole-betamethasone (LOTRISONE) 1-0.05 % cream Apply topically 2 (two) times daily 45 g 2  . cyclobenzaprine (FLEXERIL) 5 MG tablet Take 1 tablet (5 mg total) by mouth 2 (two) times daily as needed for Muscle spasms 60 tablet 5  . diclofenac (VOLTAREN) 1 % topical gel Apply 2 g topically as needed  . DULoxetine (CYMBALTA) 30 MG DR capsule TAKE 1 CAPSULE BY MOUTH EVERY DAY 90 capsule 1  . esomeprazole (NEXIUM) 40 MG DR capsule TAKE 1 CAPSULE BY MOUTH EVERY DAY 90 capsule 1  . ferrous sulfate 325 (65  FE) MG tablet Take 325 mg by mouth daily with breakfast  . glimepiride (AMARYL) 2 MG tablet TAKE 1 TABLET BY MOUTH EVERY DAY WITH BREAKFAST 90 tablet 1  . hydroCHLOROthiazide (MICROZIDE) 12.5 mg capsule Take by mouth  . losartan (COZAAR) 50 MG  tablet TAKE 1 TABLET BY MOUTH EVERY DAY 90 tablet 1  . mometasone-formoterol (DULERA) 100-5 mcg/actuation inhaler Inhale 2 inhalations into the lungs 2 (two) times daily 1 Inhaler 12  . oxyCODONE (ROXICODONE) 5 MG immediate release tablet Take 1 tablet (5 mg total) by mouth 2 (two) times daily as needed for Pain for up to 30 days 60 tablet 0  . simvastatin (ZOCOR) 20 MG tablet TAKE 1 TABLET BY MOUTH EVERY DAY EVERY NIGHT 90 tablet 1   No current Epic-ordered facility-administered medications on file.   Allergies: . Acetaminophen-Codeine Dizziness and Other (See Comments)  Other reaction(s): Unknown  . Codeine Nausea, Dizziness and Nausea And Vomiting  . Lasix [Furosemide] Dizziness  . Qualaquin [Quinine Sulfate] Dizziness  . Ultram [Tramadol] Dizziness  . Penicillin V Potassium Vomiting  Remote, only N/V, nothing concerning for true allergy, okay for trial penicillins, other beta lactams based on history (infectious disease 11/2016).   Review of Systems:  A comprehensive 14 point ROS was performed, reviewed by me today, and the pertinent orthopaedic findings are documented in the HPI.  Physical Exam: BP 124/82 (BP Location: Left upper arm, Patient Position: Sitting, BP Cuff Size: Adult)  Ht 149.9 cm (4\' 11" )  Wt 88.1 kg (194 lb 3.2 oz)  BMI 39.22 kg/m  General/Constitutional: The patient appears to be well-nourished, well-developed, and in no acute distress. Neuro/Psych: Normal mood and affect, oriented to person, place and time. Eyes: Non-icteric. Pupils are equal, round, and reactive to light, and exhibit synchronous movement. ENT: Unremarkable. Lymphatic: No palpable adenopathy. Respiratory: Lungs clear to auscultation, Normal chest excursion, No wheezes and Non-labored breathing Cardiovascular: Regular rate and rhythm. No murmurs. and No edema, swelling or tenderness, except as noted in detailed exam. Integumentary: No impressive skin lesions present, except as noted in detailed  exam. Musculoskeletal: Unremarkable, except as noted in detailed exam.  Right shoulder exam: SKIN: normal SWELLING: none WARMTH: none LYMPH NODES: no adenopathy palpable CREPITUS: none TENDERNESS: Mildly tender over anterolateral shoulder ROM (active):  Forward flexion: 30 degrees Abduction: 20 degrees Internal rotation: Right buttock ROM (passive):  Forward flexion: 110 degrees Abduction: 100 degrees  ER/IR at 90 abd: 85 degrees / 50 degrees  She has mild pain at the extremes of all motions.  STRENGTH: Forward flexion: 2+/5 Abduction: 2+/5 External rotation: 3-3+/5 Internal rotation: 3+-4/5 Pain with RC testing: Mild pain with resisted forward flexion and abduction, more so than with internal and external rotation  STABILITY: Normal  SPECIAL TESTS: Luan Pulling' test: positive, mild Speed's test: negative Capsulitis - pain w/ passive ER: no Crossed arm test: Minimally positive Crank: Not evaluated Anterior apprehension: Negative Posterior apprehension: Not evaluated  She is neurovascularly intact to the right upper extremity.  Imaging: Shoulder Imaging, MRI: Right Shoulder: MRI Shoulder Cartilage: Partial thickness humeral head cartilage loss. Partial thickness glenoid cartilage loss. MRI Shoulder Rotator Cuff: Full thickness tear of the supraspinatus, infraspinatus, and subscapularis tendons. Significant muscle atrophy of all 3 muscles. Retracted to the glenoid. MRI Shoulder Labrum / Biceps: Severe degenerative tearing of labrum, chronically torn and retracted biceps tendon. MRI Shoulder Bone: Normal bone.  Impression: Rotator cuff arthropathy, right. Nontraumatic complete tear of right rotator cuff.  Plan:  1. Treatment options were discussed  today with the patient. 2. The patient was instructed on the risk and benefits of a right reverse total shoulder arthroplasty. The patient and daughter understand the risk and benefits and wished to proceed at this time.  Surgery is scheduled with Dr. Roland Rack on 05/05/2020. 3. This document will serve as a surgical history and physical for the patient. 4. The patient will follow-up per standard postop protocol. They can call the clinic they have any questions, new symptoms develop or symptoms worsen.  The procedure was discussed with the patient, as were the potential risks (including bleeding, infection, nerve and/or blood vessel injury, persistent or recurrent pain, failure of the hardware, dislocation, need for further surgery, blood clots, strokes, heart attacks and/or arhythmias, pneumonia, etc.) and benefits. The patient states her understanding and wishes to proceed.   H&P reviewed and patient re-examined. No changes.

## 2020-05-05 NOTE — Anesthesia Postprocedure Evaluation (Signed)
Anesthesia Post Note  Patient: [name] [name]  Procedure(s) Performed: REVERSE SHOULDER ARTHROPLASTY (Right Shoulder)  Patient location during evaluation: PACU Anesthesia Type: General Level of consciousness: awake and alert Pain management: pain level controlled (Good pain control w block) Vital Signs Assessment: post-procedure vital signs reviewed and stable Respiratory status: spontaneous breathing, nonlabored ventilation, respiratory function stable and patient connected to nasal cannula oxygen Cardiovascular status: blood pressure returned to baseline and stable Postop Assessment: no apparent nausea or vomiting Anesthetic complications: no   No complications documented.   Last Vitals:  Vitals:   05/05/20 1042 05/05/20 1150  BP: (!) 109/56 107/67  Pulse: 100 104  Resp: 22 20  Temp:    SpO2: 96% 94%    Last Pain:  Vitals:   05/05/20 1150  TempSrc:   PainSc: 0-No pain                 Alphonsus Sias

## 2020-05-05 NOTE — Transfer of Care (Signed)
Immediate Anesthesia Transfer of Care Note  Patient: [name] [name]  Procedure(s) Performed: REVERSE SHOULDER ARTHROPLASTY (Right Shoulder)  Patient Location: PACU  Anesthesia Type:General  Level of Consciousness: drowsy  Airway & Oxygen Therapy: Patient Spontanous Breathing and Patient connected to face mask oxygen  Post-op Assessment: Report given to RN  Post vital signs: stable  Last Vitals:  Vitals Value Taken Time  BP 108/64 05/05/20 0954  Temp    Pulse    Resp 20 05/05/20 0956  SpO2    Vitals shown include unvalidated device data.  Last Pain:  Vitals:   05/05/20 0646  TempSrc: Tympanic  PainSc: 5          Complications: No complications documented.

## 2020-05-05 NOTE — Op Note (Signed)
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  Post-Op Diagnosis:   Same  Procedure:   Reverse right total shoulder arthroplasty.  Surgeon:   Pascal Lux, MD  Assistant:   Cameron Proud, PA-C; Gabrielle Dare, PA-S  Anesthesia:   General endotracheal with an interscalene block using Exparel placed preoperatively by the anesthesiologist.  Findings:   As above.  Complications:   None  EBL:   200 cc  Fluids:   1000 cc crystalloid  UOP:   None  TT:   None  Drains:   None  Closure:   Staples  Implants:   All press-fit Bi[employer] Comprehensive system with a #10 micro-humeral stem, a 40 mm humeral tray with a standard insert, and a mini-base plate with a 36 mm glenosphere.  Brief Clinical Note:   The patient is a 69 year old female with a long history of progressively worsening pain and weakness of the right shoulder.  Her symptoms have persisted despite medications, activity modification, etc.  Her history and examination consistent with advanced rotator cuff arthropathy secondary to a massive irreparable rotator cuff tear. The patient presents at this time for a reverse right total shoulder arthroplasty.  Procedure:   The patient underwent placement of an interscalene block using Exparel by the anesthesiologist in the preoperative holding area before being brought into the operating room and lain in the supine position. The patient then underwent general endotracheal intubation and anesthesia before the patient was repositioned in the beach chair position using the beach chair positioner. The right shoulder and upper extremity were prepped with ChloraPrep solution before being draped sterilely. Preoperative antibiotics were administered. A standard anterior approach to the shoulder was made through an approximately 4-5 inch incision. The incision was carried down  through the subcutaneous tissues to expose the deltopectoral fascia. The interval between the deltoid and pectoralis muscles was identified and this plane developed, retracting the cephalic vein laterally with the deltoid muscle. The conjoined tendon was identified. Its lateral margin was dissected and the Kolbel self-retraining retractor inserted. The "three sisters" were identified and cauterized. Bursal tissues were removed to improve visualization. The inferior capsule was released with care after identifying and protecting the axillary nerve. The proximal humeral cut was made at approximately 25 of retroversion using the extra-medullary guide.   Attention was redirected to the glenoid. The labrum was debrided circumferentially before the center of the glenoid was marked with electrocautery. The guidewire was drilled into the glenoid neck using the appropriate guide. After verifying its position, it was overreamed with the mini-baseplate reamer to create a flat surface. The permanent mini-baseplate was impacted into place. It was stabilized with a 25 x 6.5 mm central screw and four peripheral screws. Locking screws were placed superiorly and inferiorly while nonlocking screws were placed anteriorly and posteriorly. The permanent 36 mm glenosphere was then impacted into place and its Morse taper locking mechanism verified using manual distraction.  Attention was directed to the humeral side. The humeral canal was reamed sequentially beginning with the end-cutting reamer then progressing from a 4 mm reamer up to a 10 mm reamer. This provided excellent circumferential chatter. The canal was broached beginning with a #7 broach and progressing to a #10 broach. This was left in place and a trial reduction performed using the standard trial humeral platform. The arm demonstrated excellent range of motion as the hand could be  brought across the chest to the opposite shoulder and brought to the top of the patient's  head and to the patient's ear. The shoulder appeared stable throughout this range of motion. The joint was dislocated and the trial components removed. The permanent #10 micro-stem was impacted into place with care taken to maintain the appropriate version. The permanent 40 mm humeral platform with the standard insert was put together on the back table and impacted into place. Again, the [facility] taper locking mechanism was verified using manual distraction. The shoulder was relocated using two finger pressure and again placed through a range of motion with the findings as described above.  The wound was copiously irrigated with sterile saline solution using the jet lavage system before a total of 30 cc of 0.5% Sensorcaine with epinephrine was injected into the pericapsular and peri-incisional tissues to help with postoperative analgesia. The deltopectoral interval was closed using #0 Vicryl interrupted sutures before the subcutaneous tissues were closed using 2-0 Vicryl interrupted sutures. The skin was closed using staples. Prior to closing the skin, 1 g of transexemic acid in 10 cc of normal saline was injected intra-articularly to help with postoperative bleeding. A sterile occlusive dressing was applied to the wound before the arm was placed into a shoulder immobilizer with an abduction pillow. A Polar Care system also was applied to the shoulder. The patient was then transferred back to a hospital bed before being awakened, extubated, and returned to the recovery room in satisfactory condition after tolerating the procedure well.

## 2020-05-05 NOTE — Anesthesia Procedure Notes (Addendum)
Anesthesia Regional Block: Interscalene brachial plexus block   Pre-Anesthetic Checklist: ,, timeout performed, Correct Patient, Correct Site, Correct Laterality, Correct Procedure, Correct Position, site marked, Risks and benefits discussed,  Surgical consent,  Pre-op evaluation,  At surgeon's request and post-op pain management  Laterality: Upper and Right  Prep: chloraprep       Needles:  Injection technique: Single-shot  Needle Type: Echogenic Stimulator Needle     Needle Length: 10cm  Needle Gauge: 21   Needle insertion depth: 6 cm   Additional Needles:   Procedures: Doppler guided,,,, ultrasound used (permanent image in chart),,,,  Motor weakness within 5 minutes.  Narrative:  Start time: 05/05/2020 7:15 AM End time: 05/05/2020 7:21 AM Injection made incrementally with aspirations every 5 mL.  Performed by: Personally  Anesthesiologist: Alphonsus Sias, MD  Additional Notes: Functioning IV was confirmed and O2 [place]/monitors were applied. Light sedation administered as required, patient responsive throughout. A 136mm 21ga EchoStim needle was used. Sterile prep and drape,hand hygiene and sterile gloves were used.  Negative aspiration and negative test dose prior to incremental administration of local anesthetic. 1% Lidocaine for skin wheal, 3 ml. Total LA: 96ml - Exparel 70ml & 0.5% Bupivicaine 5ml. U/S images stored in chart. The patient tolerated the procedure well.

## 2020-05-05 NOTE — Evaluation (Signed)
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  Clinical Impression  Pt was willing to work with PT but fatigued very quickly with modest activity and showed unsteadiness with SPC during 10 ft of ambulation.  She reports she uses walker all the time and gets very fatigued with simple in home ambulation at baseline.  Pt does have family that will be able to stay with her 24/7 and per today's performance she will very likely need considerable assist when she goes home.  Will trial ambulation with hemiwalker tomorrow as she was limited and unsteady with mobility/ambulation today.     Follow Up Recommendations Follow surgeon's recommendation for DC plan and follow-up therapies    Equipment Recommendations   (hemiwalker)    Recommendations for Other Services       Precautions / Restrictions Precautions Precautions: Shoulder Type of Shoulder Precautions: total Shoulder Interventions: Shoulder sling/immobilizer Required Braces or Orthoses: Sling Restrictions Weight Bearing Restrictions: Yes RUE Weight Bearing: Non weight bearing      Mobility  Bed Mobility Overal bed mobility: Needs Assistance Bed Mobility: Supine to Sit     Supine to sit: Min assist     General bed mobility comments: Pt needed HHA to get to sitting EOB, made good effort but unable to w/o assit  Transfers Overall transfer level: Needs assistance Equipment used: Straight cane Transfers: Sit to/from Stand Sit to Stand: Min assist         General transfer comment: Pt with general unsteadiness, difficutly with management of AD and light assist to maintain balance  Ambulation/Gait Ambulation/Gait assistance: Min guard;Min assist Gait Distance (Feet): 10 Feet Assistive device: Straight cane       General Gait Details: Pt was able to ambulate  ~10 ft, showed good effort but fatigued quickly, showed general unsteadiness w/o overt LOBs.  O2 remains in the high 90s, HR did increase to 100s and she reports significant fatigue with the effort.  Stairs            Wheelchair Mobility    Modified Rankin (Stroke Patients Only)       Balance Overall balance assessment: Needs assistance Sitting-balance support: Single extremity supported Sitting balance-Leahy Scale: Good     Standing balance support: Single extremity supported Standing balance-Leahy Scale: Fair                               Pertinent Vitals/Pain Pain Assessment: No/denies pain    Home Living Family/patient expects to be discharged to:: Private residence Living Arrangements: Children Available Help at Discharge: Family;Available 24 hours/day;Available PRN/intermittently Type of Home: Apartment Home Access:  (pt reports no steps to enter)     Home Layout: One level Home Equipment: Walker - 2 wheels;Cane - single point;Hand held shower head      Prior Function Level of Independence: Needs assistance   Gait / Transfers Assistance Needed: Pt reports she rarely walks more than 50 ft, gets very fatigued, uses walker most of the time  ADL's / Homemaking Assistance Needed: Pt reports difficulty with LB dressing sometimes and UB dressing frequently requiring dtr's assist; dtr/son assist with transportation and groceries; pt manages meds with weekly pill organizer        Hand Dominance        Extremity/Trunk Assessment   Upper Extremity  Assessment Upper Extremity Assessment:  (R in sling, L shouler elevation to ~75, grossly 3-/5)            Communication   Communication: No difficulties  Cognition Arousal/Alertness: Awake/alert Behavior During Therapy: WFL for tasks assessed/performed Overall Cognitive Status: Within Functional Limits for tasks assessed                                        General Comments       Exercises     Assessment/Plan    PT Assessment Patient needs continued PT services  PT Problem List Decreased strength;Decreased range of motion;Decreased activity tolerance;Decreased balance;Decreased mobility;Decreased coordination;Decreased knowledge of use of DME;Decreased safety awareness;Decreased knowledge of precautions;Pain       PT Treatment Interventions DME instruction;Gait training;Stair training;Functional mobility training;Therapeutic activities;Therapeutic exercise;Balance training;Neuromuscular re-education;Patient/family education    PT Goals (Current goals can be found in the Care Plan section)  Acute Rehab PT Goals Patient Stated Goal: go home PT Goal Formulation: With patient Time For Goal Achievement: 05/19/20 Potential to Achieve Goals: Fair    Frequency BID   Barriers to discharge        Co-evaluation               AM-PAC PT "6 Clicks" Mobility  Outcome Measure Help needed turning from your back to your side while in a flat bed without using bedrails?: A Little Help needed moving from lying on your back to sitting on the side of a flat bed without using bedrails?: A Little Help needed moving to and from a bed to a chair (including a wheelchair)?: A Little Help needed standing up from a chair using your arms (e.g., wheelchair or bedside chair)?: A Little Help needed to walk in hospital room?: A Lot Help needed climbing 3-5 steps with a railing? : A Lot 6 Click Score: 16    End of Session Equipment Utilized During Treatment: Gait belt Activity Tolerance: Patient limited by fatigue Patient left: in chair;with bed alarm set Nurse Communication: Mobility status PT Visit Diagnosis: Muscle weakness (generalized) (M62.81);Unsteadiness on feet (R26.81);Pain;Difficulty in walking, not elsewhere classified (R26.2) Pain - Right/Left: Right Pain - part of body: Shoulder    Time: 1635-1700 PT Time Calculation (min) (ACUTE ONLY): 25  min   Charges:   PT Evaluation $PT Eval Low Complexity: 1 Low PT Treatments $Therapeutic Activity: 8-22 mins        Kreg Shropshire, DPT 05/05/2020, 6:26 PM

## 2020-05-05 NOTE — Anesthesia Preprocedure Evaluation (Addendum)
Anesthesia Evaluation  Patient identified by MRN, date of birth, ID band Patient awake    Reviewed: Allergy & Precautions, H&P , NPO status , reviewed documented beta blocker date and time   Airway Mallampati: II  TM Distance: >3 FB Neck ROM: limited    Dental  (+) Upper Dentures, Missing   Pulmonary shortness of breath, asthma , sleep apnea , COPD, former smoker,  Counseled re: OSA with GA. Pt to be monitored tonight   Pulmonary exam normal        Cardiovascular hypertension, + DOE  Normal cardiovascular exam+ Valvular Problems/Murmurs   12/2019 ECHO 1. Left ventricular ejection fraction, by estimation, is 60 to 65%. The  left ventricle has normal function. The left ventricle has no regional  wall motion abnormalities. Left ventricular diastolic parameters are  consistent with Grade I diastolic  dysfunction (impaired relaxation).  2. Right ventricular systolic function is normal. The right ventricular  size is normal. There is moderately elevated pulmonary artery systolic  pressure.  3. The mitral valve is normal in structure. Mild mitral valve  regurgitation.  4. The aortic valve is grossly normal. Aortic valve regurgitation is mild  to moderate. Mild to moderate aortic valve sclerosis/calcification is  present, without any evidence of aortic stenosis.   RBBB on EKG   Neuro/Psych TIA Neuromuscular disease    GI/Hepatic GERD  Medicated and Controlled,  Endo/Other  diabetes  Renal/GU Renal disease     Musculoskeletal   Abdominal   Peds  Hematology  (+) Blood dyscrasia, anemia ,   Anesthesia Other Findings Past Medical History: No date: Anemia No date: Asthma No date: Chronic airway obstruction (HCC) No date: Chronic kidney disease     Comment:  F/U WITH DR LATEEF No date: Diabetes mellitus without complication (HCC) No date: Dyspnea     Comment:  WITH EXERTION No date: GERD (gastroesophageal reflux  disease) No date: Heart murmur     Comment:  ASYMPTOMATIC No date: History of adenomatous polyp of colon No date: History of bone density study No date: Hyperlipidemia No date: Hypertension No date: Idiopathic peripheral neuropathy No date: Morbid obesity (HCC) No date: Osteoporosis 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 No date: Pulmonary fibrosis (HCC) No date: Sleep apnea     Comment:  Hypersomnia with sleep apnea-DOES NOT USE CPAP No date: TIA (transient ischemic attack) Past Surgical History: No date: ABDOMINAL HYSTERECTOMY No date: BACK SURGERY No date: BLADDER SUSPENSION No date: CESAREAN SECTION No date: COLONOSCOPY No date: DILATION AND CURETTAGE OF UTERUS No date: ESOPHAGOGASTRODUODENOSCOPY 10/15/2016: LUMBAR LAMINECTOMY/DECOMPRESSION MICRODISCECTOMY; N/A     Comment:  Procedure: L3-L5 Laminectomy ;  Surgeon: Deetta Perla,               MD;  Location: [facility] ORS;  Service: Neurosurgery;                Laterality: N/A; 11/05/2016: LUMBAR WOUND DEBRIDEMENT; N/A     Comment:  Procedure: LUMBAR WOUND DEBRIDEMENT;  Surgeon: Deetta Perla, MD;  Location: [facility] ORS;  Service: Neurosurgery;                Laterality: N/A; No date: TUBAL LIGATION   Reproductive/Obstetrics                            Anesthesia Physical Anesthesia Plan  ASA: III  Anesthesia Plan: General   Post-op Pain  Management:  Regional for Post-op pain   Induction: Intravenous  PONV Risk Score and Plan: Ondansetron and Treatment may vary due to age or medical condition  Airway Management Planned: Oral ETT  Additional Equipment:   Intra-op Plan:   Post-operative Plan: Extubation in OR  Informed Consent: I have reviewed the patients History and Physical, chart, labs and discussed the procedure including the risks, benefits and alternatives for the proposed anesthesia with the patient or authorized representative who has indicated his/her understanding and  acceptance.     Dental Advisory Given  Plan Discussed with: CRNA  Anesthesia Plan Comments:        Anesthesia Quick Evaluation

## 2020-05-06 ENCOUNTER — Encounter: Payer: Self-pay | Admitting: Surgery

## 2020-05-06 LAB — BASIC METABOLIC PANEL
Anion gap: 9 (ref 5–15)
BUN: 17 mg/dL (ref 8–23)
CO2: 21 mmol/L — ABNORMAL LOW (ref 22–32)
Calcium: 7.8 mg/dL — ABNORMAL LOW (ref 8.9–10.3)
Chloride: 110 mmol/L (ref 98–111)
Creatinine, Ser: 1.11 mg/dL — ABNORMAL HIGH (ref 0.44–1.00)
GFR calc Af Amer: 60 mL/min — ABNORMAL LOW (ref >60)
GFR calc non Af Amer: 52 mL/min — ABNORMAL LOW (ref >60)
Glucose, Bld: 119 mg/dL — ABNORMAL HIGH (ref 70–99)
Potassium: 4.2 mmol/L (ref 3.5–5.1)
Sodium: 140 mmol/L (ref 135–145)

## 2020-05-06 LAB — CBC
HCT: 29.7 % — ABNORMAL LOW (ref 36.0–46.0)
Hemoglobin: 9.6 g/dL — ABNORMAL LOW (ref 12.0–15.0)
MCH: 28.7 pg (ref 26.0–34.0)
MCHC: 32.3 g/dL (ref 30.0–36.0)
MCV: 88.9 fL (ref 80.0–100.0)
Platelets: 233 10*3/uL (ref 150–400)
RBC: 3.34 MIL/uL — ABNORMAL LOW (ref 3.87–5.11)
RDW: 16.2 % — ABNORMAL HIGH (ref 11.5–15.5)
WBC: 9.9 10*3/uL (ref 4.0–10.5)
nRBC: 0 % (ref 0.0–0.2)

## 2020-05-06 LAB — GLUCOSE, CAPILLARY: Glucose-Capillary: 104 mg/dL — ABNORMAL HIGH (ref 70–99)

## 2020-05-06 LAB — HEMOGLOBIN A1C
Hgb A1c MFr Bld: 6.9 % — ABNORMAL HIGH (ref 4.8–5.6)
Mean Plasma Glucose: 151 mg/dL

## 2020-05-06 MED ORDER — OXYCODONE HCL 5 MG PO TABS: 5.0000 mg | ORAL_TABLET | ORAL | 0 refills | Status: DC | PRN

## 2020-05-06 MED ORDER — ASPIRIN EC 325 MG PO TBEC: 325.0000 mg | DELAYED_RELEASE_TABLET | Freq: Every day | ORAL | 0 refills | Status: DC

## 2020-05-06 NOTE — TOC Progression Note (Signed)
Transition of Care [facility]) - Progression Note    Patient Details  Name: [name] [name] MRN: [id number] Date of Birth: August 21, 1951  Transition of Care [facility]) CM/SW [place], RN Phone Number: 05/06/2020, 9:09 AM  Clinical Narrative:    Met with the patient to discuss DC plan and needs, she lives at home and her son lives with her, her daughter lives across the road, her family provides transportation, she will get a [employer] fast streamer from [employer] today before going home, [facility]. She is up to date with PCP and can afford her medicaitons   Expected Discharge Plan: [employer] Barriers to Discharge: Barriers Resolved  Expected Discharge Plan and Services Expected Discharge Plan: [place]   Discharge Planning Services: CM Consult   Living arrangements for the past 2 months: Single Family Home Expected Discharge Date: 05/06/20               DME Arranged: Gilford Rile hemi DME Agency: [employer] Date DME Agency Contacted: 05/06/20 Time DME Agency Contacted: [phone] Representative spoke with at DME Agency: Zack HH Arranged: PT, OT [place] Agency: [facility] (formerly [employer]) Date [place]: 05/06/20 Time [place]: [id number] Representative spoke with at [place]: [place] ([place]) Interventions    Readmission Risk Interventions Readmission Risk Prevention Plan 11/24/2019  Transportation Screening Complete  PCP or Specialist Appt within 3-5 Days Complete  HRI or [place] Complete  Social Work Consult for [place] Planning/Counseling [place] Not Applicable  Medication Review Press photographer) Complete  Some recent data might be hidden

## 2020-05-06 NOTE — Evaluation (Signed)
Occupational Therapy Evaluation Patient Details Name: [name] [name] MRN: [id number] DOB: 05-14-1951 Today's Date: 05/06/2020    History of Present Illness 69 y/o female s/p R reverse total shoudler replacement 7/29. PMHx includes TIA, DMII, HTN, GERD.   Clinical Impression   Patient was seen for an OT evaluation this date. Pt lives with her children and reports they can be there 24/7 if needed. Pt strongly encouraged to have 24/7 assist at home at least initially. Prior to surgery, pt was limited in activity tolerance and mobility, very limited 2/2 R shoulder deficits for about 1 year, including unable to drive over the past year. Dtr assisted pt with UB dressing and dtr/son provided transportation, groceries, cleaning, and meal prep. Pt was ambulating with rollator. Pt has orders for RUE to be immobilized and will be NWBing per MD. Patient presents with impaired strength/ROM, pain, and sensation to RUE. These impairments result in a decreased ability to perform self care tasks requiring MOD-MAX ASSIST for UB/LB dressing and bathing and MAX ASSIST for application of polar care, compression stockings, and sling/immobilizer. Pt instructed in polar care mgt, compression stockings mgt, sling/immobilizer mgt, ROM exercises for RUE (with instructions for no shoulder exercises until full sensation has returned), RUE precautions, adaptive strategies for bathing/dressing/toileting/grooming, positioning and considerations for sleep, and home/routines modifications to maximize falls prevention, safety, and independence. Handout provided. OT adjusted sling/immobilizer and polar care to improve comfort, optimize positioning, and to maximize skin integrity/safety. Pt verbalized understanding of all education/training provided. Pt will benefit from skilled OT services to address these limitations and improve independence in daily tasks. Recommend follow up per surgeon for additional OT services to continue therapy to  maximize return to PLOF, address home/routines modifications and safety, minimize falls risk, and minimize caregiver burden.      Follow Up Recommendations  Supervision/Assistance - 24 hour;Follow surgeon's recommendation for DC plan and follow-up therapies    Equipment Recommendations  3 in 1 bedside commode    Recommendations for Other Services       Precautions / Restrictions Precautions Precautions: Shoulder;Fall Type of Shoulder Precautions: total Shoulder Interventions: Shoulder sling/immobilizer Precaution Booklet Issued: Yes (comment) Required Braces or Orthoses: Sling Restrictions Weight Bearing Restrictions: Yes RUE Weight Bearing: Non weight bearing      Mobility Bed Mobility Overal bed mobility: Needs Assistance Bed Mobility: Supine to Sit     Supine to sit: HOB elevated;Min assist     General bed mobility comments: deferred, in recliner  Transfers Overall transfer level: Needs assistance Equipment used: Hemi-walker Transfers: Sit to/from Stand Sit to Stand: Min assist         General transfer comment: pt declined, stating she just walked with PT and was fatigued    Balance Overall balance assessment: Needs assistance Sitting-balance support: Feet supported;Single extremity supported Sitting balance-Leahy Scale: Good Sitting balance - Comments: no LOB in sitting   Standing balance support: Single extremity supported;During functional activity Standing balance-Leahy Scale: Fair Standing balance comment: pt requires constant CGA-min assist once fatigued to ambulat short distances with LUE holding AD                           ADL either performed or assessed with clinical judgement   ADL Overall ADL's : Needs assistance/impaired  General ADL Comments: Pt requires Mod+ Assist for UB and LB bathing and dressing, Min A for toileting, and Min A for ADL mobility; Max assist for shoulder  sling/immobilizer, compression stocking and polar care mgt; pt reports family able to provide needed level of assist     Vision Baseline Vision/History: Wears glasses Wears Glasses: At all times Patient Visual Report: No change from baseline       Perception     Praxis      Pertinent Vitals/Pain Pain Assessment: No/denies pain     Hand Dominance Right   Extremity/Trunk Assessment Upper Extremity Assessment Upper Extremity Assessment: RUE deficits/detail;LUE deficits/detail;Generalized weakness RUE Deficits / Details: in sling, impaired sensation to hand RUE Sensation: decreased light touch RUE Coordination: decreased fine motor;decreased gross motor LUE Deficits / Details: generally weak   Lower Extremity Assessment Lower Extremity Assessment: Generalized weakness       Communication Communication Communication: No difficulties   Cognition Arousal/Alertness: Awake/alert Behavior During Therapy: WFL for tasks assessed/performed Overall Cognitive Status: Within Functional Limits for tasks assessed                                 General Comments: Pt is A and O x 4 and cooperative and pleasant throughout.   General Comments       Exercises Other Exercises Other Exercises: Pt instructed in falls prevention, home/routines modifications, AE/DME, shoulder sling/immobilizer mgt, shoulder precautions and how to maintain during ADL, compression stocking mgt, and polar care mgt, positioning for sleep; handout provided to support recall and carryover with family/caregivers at home   Shoulder Instructions      [place] expects to be discharged to:: Private residence Living Arrangements: Children Available Help at Discharge: Family;Available 24 hours/day;Available PRN/intermittently Type of Home: Apartment Home Access:  (pt reports no steps to enter)     Home Layout: One level     Bathroom Shower/Tub: Tub/shower unit;Curtain   Armed forces operational officer: Standard     Home Equipment: Environmental consultant - 2 wheels;Cane - single point;Hand held shower head          Prior Functioning/Environment Level of Independence: Needs assistance  Gait / Transfers Assistance Needed: Pt reports she rarely walks more than 50 ft, gets very fatigued, uses walker most of the time ADL's / Homemaking Assistance Needed: Pt reports difficulty with LB dressing sometimes and UB dressing frequently requiring dtr's assist; dtr/son assist with transportation and groceries; pt manages meds with weekly pill organizer   Comments: Pt reports fall in February (verified by chart review and admission), no additional        OT Problem List: Decreased strength;Decreased coordination;Decreased range of motion;Impaired sensation;Decreased safety awareness;Decreased activity tolerance;Impaired balance (sitting and/or standing);Decreased knowledge of use of DME or AE;Obesity;Impaired UE functional use;Decreased knowledge of precautions      OT Treatment/Interventions: Self-care/ADL training;Therapeutic exercise;Therapeutic activities;DME and/or AE instruction;Patient/family education;Balance training    OT Goals(Current goals can be found in the care plan section) Acute Rehab OT Goals Patient Stated Goal: go home and get better so I can have the L shoulder done OT Goal Formulation: With patient Time For Goal Achievement: 05/20/20 Potential to Achieve Goals: Good ADL Goals Pt Will Perform Upper Body Dressing: with caregiver independent in assisting;sitting;with mod assist Pt Will Perform Lower Body Dressing: with mod assist;sit to/from stand;with caregiver independent in assisting Pt Will Transfer to Toilet: with min guard assist;ambulating;bedside commode Additional ADL Goal #1: Pt will  verbalize 100% of shoulder precautions and how to maintain during ADL tasks. Additional ADL Goal #2: Pt will independently instruct family/caregiver in polar care mgt and shoulder  sling/immobilizer mgt.  OT Frequency: Min 1X/week   Barriers to D/C:            Co-evaluation              AM-PAC OT "6 Clicks" Daily Activity     Outcome Measure Help from another person eating meals?: None Help from another person taking care of personal grooming?: A Little Help from another person toileting, which includes using toliet, bedpan, or urinal?: A Lot Help from another person bathing (including washing, rinsing, drying)?: A Lot Help from another person to put on and taking off regular upper body clothing?: A Lot Help from another person to put on and taking off regular lower body clothing?: A Lot 6 Click Score: 15   End of Session Nurse Communication: Other (comment) (needs green chair alarm box)  Activity Tolerance: Patient tolerated treatment well Patient left: in chair;with call bell/phone within reach;Other (comment) (shoulder sling/immobilizer, TED hose, and polar care in place; no chair alarm box present in room)  OT Visit Diagnosis: Other abnormalities of gait and mobility (R26.89);Repeated falls (R29.6);Muscle weakness (generalized) (M62.81)                Time: 0940-1001 OT Time Calculation (min): 21 min Charges:  OT General Charges $OT Visit: 1 Visit OT Evaluation $OT Eval Moderate Complexity: 1 Mod OT Treatments $Self Care/Home Management : 8-22 mins  Jeni Salles, MPH, MS, OTR/L ascom [phone] 05/06/20, 10:17 AM

## 2020-05-06 NOTE — Discharge Summary (Signed)
Physician Discharge Summary  Patient ID: [name] [name] MRN: [id number] DOB/AGE: [birth year] 69 y.o.  Admit date: 05/05/2020 Discharge date: 05/06/2020  Admission Diagnoses:  Status post reverse total shoulder replacement, right [Z96.611]  Discharge Diagnoses: Patient Active Problem List   Diagnosis Date Noted   Status post reverse total shoulder replacement, right 05/05/2020   Normocytic anemia 04/04/2020   Cellulitis 01/03/2020   Cellulitis of right arm 01/02/2020   Sepsis ([place]) 01/02/2020   Asthma 01/02/2020   HTN (hypertension) 01/02/2020   Anemia due to stage 3b chronic kidney disease 12/22/2019   Pedal edema 12/01/2019   Fall 11/18/2019   Closed rib fracture 11/18/2019   Pulmonary fibrosis (HCC)    Asthma exacerbation    Acute renal failure superimposed on stage 3a chronic kidney disease (HCC)    Chronic pain syndrome 09/15/2018   AVD (aortic valve disease) 10/07/2017   Benign essential hypertension 06/17/2017   Nonrheumatic aortic valve insufficiency 06/17/2017   CKD (chronic kidney disease), stage IIIa 05/20/2017   Wound dehiscence 11/05/2016   Lumbar stenosis 10/15/2016   Renal impairment 10/05/2016   Right lumbar radiculopathy 10/05/2016   Complete tear of right rotator cuff 11/28/2015   Rotator cuff arthropathy, right 11/28/2015   Low vitamin D level 02/28/2015   Gastroesophageal reflux disease without esophagitis 12/14/2014   Morbid obesity with BMI of 40.0-44.9, adult ([place]) 10/06/2014   Aortic stenosis 09/14/2014   DOE (dyspnea on exertion) 09/08/2014   History of adenomatous polyp of colon 09/08/2014   Type II diabetes mellitus with renal manifestations ([place]) 03/22/2014   Hyperlipidemia 03/22/2014    Past Medical History:  Diagnosis Date   Anemia    Asthma    Chronic airway obstruction (HCC)    Chronic kidney disease    F/U WITH DR LATEEF   Diabetes mellitus without complication (HCC)    Dyspnea    WITH  EXERTION   GERD (gastroesophageal reflux disease)    Heart murmur    ASYMPTOMATIC   History of adenomatous polyp of colon    History of bone density study    Hyperlipidemia    Hypertension    Idiopathic peripheral neuropathy    Morbid obesity ([place])    Osteoporosis    Plantar fascial fibromatosis    Pulmonary fibrosis ([place])    Sleep apnea    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   Consultants (if any):   Discharged Condition: Improved  Hospital Course: [name] [name] is an 69 y.o. female who was admitted 05/05/2020 with a diagnosis of a massive irreparable rotator cuff tear with advanced rotator cuff arthropathy of the right shoulder and went to the operating room on 05/05/2020 and underwent the above named procedures.    Surgeries: Procedure(s): REVERSE SHOULDER ARTHROPLASTY on 05/05/2020 Patient tolerated the surgery well. Taken to PACU where she was stabilized and then transferred to the orthopedic floor.  Started on Lovenox 40mg  q 24 hrs. Foot pumps applied bilaterally at 80 mm. Heels elevated on bed with rolled towels. No evidence of DVT. Negative [place]. Physical therapy started on day #1 for gait training and transfer. OT started day #1 for ADL and assisted devices.  Patient's IV was removed on POD1.  Implants: All press-fit [employer] Comprehensive system with a #10 micro-humeral stem, a 40 mm humeral tray with a standard insert, and a mini-base plate with a 36 mm glenosphere.  She was given perioperative antibiotics:  Anti-infectives (From  admission, onward)   Start     Dose/Rate Route Frequency Ordered Stop   05/05/20 1410  clindamycin (CLEOCIN) 600 MG/50ML IVPB       Note to Pharmacy: Tippett, Amy   : cabinet override      05/05/20 1410 05/06/20 0229   05/05/20 1400  clindamycin (CLEOCIN) IVPB 600 mg        600 mg 100 mL/hr over 30 Minutes Intravenous Every 6 hours 05/05/20 1056 05/06/20 0306    05/05/20 0617  clindamycin (CLEOCIN) 900 MG/50ML IVPB       Note to Pharmacy: Sylvester Harder   : cabinet override      05/05/20 0617 05/05/20 0749   05/05/20 0615  clindamycin (CLEOCIN) IVPB 900 mg        900 mg 100 mL/hr over 30 Minutes Intravenous On call to O.R. 05/05/20 8185 05/05/20 6314    .  She was given sequential compression devices, early ambulation, and Lovenox for DVT prophylaxis.  She benefited maximally from the hospital stay and there were no complications.    Recent vital signs:  Vitals:   05/06/20 0454 05/06/20 0805  BP: (!) 137/71 120/67  Pulse: 102 102  Resp:  16  Temp: 98.4 F (36.9 C) 98.2 F (36.8 C)  SpO2: 99% 98%    Recent laboratory studies:  Lab Results  Component Value Date   HGB 9.6 (L) 05/06/2020   HGB 12.4 05/03/2020   HGB 11.7 (L) 04/04/2020   Lab Results  Component Value Date   WBC 9.9 05/06/2020   PLT 233 05/06/2020   Lab Results  Component Value Date   INR 0.96 10/09/2016   Lab Results  Component Value Date   NA 140 05/06/2020   K 4.2 05/06/2020   CL 110 05/06/2020   CO2 21 (L) 05/06/2020   BUN 17 05/06/2020   CREATININE 1.11 (H) 05/06/2020   GLUCOSE 119 (H) 05/06/2020    Discharge Medications:   Allergies as of 05/06/2020      Reactions   Acetaminophen-codeine Other (See Comments)   Other reaction(s): Unknown   Codeine Nausea And Vomiting   Furosemide Other (See Comments)   Penicillin V Potassium    Other reaction(s): Unknown   Quinine    Other reaction(s): Unknown   Tramadol    Other reaction(s): Unknown   Tylenol [acetaminophen]    Tylenol-Codeine: Dizziness      Medication List    TAKE these medications   allopurinol 100 MG tablet Commonly known as: ZYLOPRIM Take 100 mg by mouth every morning.   aspirin EC 325 MG tablet Take 1 tablet (325 mg total) by mouth daily.   clotrimazole-betamethasone cream Commonly known as: LOTRISONE Apply 1 application topically 2 (two) times daily as needed (itching).    cyclobenzaprine 5 MG tablet Commonly known as: FLEXERIL Take 5 mg by mouth 2 (two) times daily as needed for muscle spasms.   DULoxetine 30 MG capsule Commonly known as: CYMBALTA Take 30 mg by mouth every morning.   esomeprazole 40 MG capsule Commonly known as: NEXIUM Take 40 mg by mouth every morning.   ferrous sulfate 325 (65 FE) MG tablet Take 1 tablet (325 mg total) by mouth 2 (two) times daily with a meal. What changed: when to take this   glimepiride 2 MG tablet Commonly known as: AMARYL Take 2 mg by mouth every morning.   losartan 50 MG tablet Commonly known as: COZAAR Take 50 mg by mouth every morning.   metFORMIN 500 MG  tablet Commonly known as: GLUCOPHAGE Take 500 mg by mouth daily with breakfast.   mometasone-formoterol 100-5 MCG/ACT Aero Commonly known as: DULERA Inhale 2 puffs into the lungs daily as needed for wheezing or shortness of breath.   oxyCODONE 5 MG immediate release tablet Commonly known as: Oxy IR/ROXICODONE Take 1-2 tablets (5-10 mg total) by mouth every 4 (four) hours as needed for moderate pain. What changed:   how much to take  reasons to take this   simvastatin 20 MG tablet Commonly known as: ZOCOR Take 20 mg by mouth every morning.      Diagnostic Studies: DG Shoulder Right Port  Result Date: 05/05/2020 CLINICAL DATA:  Postoperative evaluation. EXAM: PORTABLE RIGHT SHOULDER COMPARISON:  None. FINDINGS: There is no evidence of an acute fracture or dislocation. A radiopaque right shoulder replacement is seen without evidence of surrounding lucency to suggest the presence of hardware loosening or infection. Mild to moderate severity chronic and degenerative changes seen involving the right acromioclavicular joint. Mild soft tissue swelling is seen along the lateral aspect of the proximal right upper extremity. IMPRESSION: Right shoulder replacement without evidence of hardware complication or acute fracture. Electronically Signed   By:  Virgina Norfolk M.D.   On: 05/05/2020 10:17   [place] OR NERVE BLOCK-IMAGE ONLY [facility])  Result Date: 05/05/2020 There is no interpretation for this exam.  This order is for images obtained during a surgical procedure.  Please See "Surgeries" Tab for more information regarding the procedure.   Disposition: Will plan on discharge home today with HHPT.  Continue Aspirin 325mg  daily for 6 weeks.   Follow-up Information    Lattie Corns, PA-C Follow up in 14 day(s).   Specialty: Physician Assistant Why: Electa Sniff information: [place] [address] [phone]              Signed: Judson Roch PA-C 05/06/2020, 8:40 AM

## 2020-05-06 NOTE — Plan of Care (Signed)
°  Problem: Education: Goal: Knowledge of General Education information will improve Description: Including pain rating scale, medication(s)/side effects and non-pharmacologic comfort measures Outcome: Progressing   Problem: Clinical Measurements: Goal: Will remain free from infection Outcome: Progressing Goal: Respiratory complications will improve Outcome: Progressing Goal: Cardiovascular complication will be avoided Outcome: Progressing   Problem: Coping: Goal: Level of anxiety will decrease Outcome: Progressing

## 2020-05-06 NOTE — Progress Notes (Signed)
Subjective: 1 Day Post-Op Procedure(s) (LRB): REVERSE SHOULDER ARTHROPLASTY (Right) Patient reports pain as 0 on 0-10 scale.   Patient is well, and has had no acute complaints or problems Plan is to go Home after hospital stay. Negative for chest pain and shortness of breath Fever: no Gastrointestinal:Negative for nausea and vomiting  Objective: Vital signs in last 24 hours: Temp:  [97.4 F (36.3 C)-98.4 F (36.9 C)] 98.2 F (36.8 C) (07/30 0805) Pulse Rate:  [89-104] 102 (07/30 0805) Resp:  [16-24] 16 (07/30 0805) BP: (107-137)/(48-74) 120/67 (07/30 0805) SpO2:  [94 %-100 %] 98 % (07/30 0805)  Intake/Output from previous day:  Intake/Output Summary (Last 24 hours) at 05/06/2020 0837 Last data filed at 05/06/2020 0430 Gross per 24 hour  Intake 1604.06 ml  Output 200 ml  Net 1404.06 ml    Intake/Output this shift: No intake/output data recorded.  Labs: Recent Labs    05/03/20 1134 05/06/20 0401  HGB 12.4 9.6*   Recent Labs    05/03/20 1134 05/06/20 0401  WBC 11.5* 9.9  RBC 4.28 3.34*  HCT 38.7 29.7*  PLT 307 233   Recent Labs    05/03/20 1134 05/06/20 0401  NA 137 140  K 4.3 4.2  CL 106 110  CO2 22 21*  BUN 21 17  CREATININE 1.10* 1.11*  GLUCOSE 120* 119*  CALCIUM 9.4 7.8*   No results for input(s): LABPT, INR in the last 72 hours.   EXAM General - Patient is Alert, Appropriate and Oriented Extremity - ABD soft Intact pulses distally Dorsiflexion/Plantar flexion intact Incision: scant drainage No cellulitis present  Decreased sensation to light touch to the right upper extremity from recent interscalene block.  Able to flex and extend fingers. Dressing/Incision - blood tinged drainage Motor Function - intact, moving foot and toes well on exam.  Bowel sounds intact.  Past Medical History:  Diagnosis Date  . Anemia   . Asthma   . Chronic airway obstruction ([place])   . Chronic kidney disease    F/U WITH DR LATEEF  . Diabetes mellitus without  complication ([place])   . Dyspnea    WITH EXERTION  . GERD (gastroesophageal reflux disease)   . Heart murmur    ASYMPTOMATIC  . History of adenomatous polyp of colon   . History of bone density study   . Hyperlipidemia   . Hypertension   . Idiopathic peripheral neuropathy   . Morbid obesity ([place])   . Osteoporosis   . Plantar fascial fibromatosis   . Pulmonary fibrosis ([place])   . 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 TIA (transient ischemic attack)     Assessment/Plan: 1 Day Post-Op Procedure(s) (LRB): REVERSE SHOULDER ARTHROPLASTY (Right) Active Problems:   Status post reverse total shoulder replacement, right  Estimated body mass index is 40.13 kg/m as calculated from the following:   Height as of this encounter: 4\' 10"  (1.473 m).   Weight as of this encounter: 87.1 kg. Advance diet Up with therapy D/C IV fluids when tolerating po intake.  Labs reviewed this AM. Passing gas, continue to work on BM. Up with therapy today to continue to work on using a hemiwalker for ambulation.   Plan will be for discharge home today and begin HHPT.  DVT Prophylaxis - Lovenox, Foot Pumps and TED hose Non-weightbearing to the right arm.  Raquel Keir Viernes, PA-C [facility] Orthopaedic Surgery 05/06/2020, 8:37 AM

## 2020-05-06 NOTE — Discharge Instructions (Signed)
Diet: As you were doing prior to hospitalization  ? ?Shower:  May shower but keep the wounds dry, use an occlusive plastic wrap, NO SOAKING IN TUB.  If the bandage gets wet, change with a clean dry gauze. ? ?Dressing:  You may change your dressing as needed. Change the dressing with sterile gauze dressing.   ? ?Activity:  Increase activity slowly as tolerated, but follow the weight bearing instructions below.  No lifting or driving for 6 weeks. ? ?Weight Bearing:   Non-weightbearing to the right arm. ? ?To prevent constipation: you may use a stool softener such as - ? ?Colace (over the counter) 100 mg by mouth twice a day  ?Drink plenty of fluids (prune juice may be helpful) and high fiber foods ?Miralax (over the counter) for constipation as needed.   ? ?Itching:  If you experience itching with your medications, try taking only a single pain pill, or even half a pain pill at a time.  You may take up to 10 pain pills per day, and you can also use benadryl over the counter for itching or also to help with sleep.  ? ?Precautions:  If you experience chest pain or shortness of breath - call 911 immediately for transfer to the hospital emergency department!! ? ?If you develop a fever greater that 101 F, purulent drainage from wound, increased redness or drainage from wound, or calf pain-Call [facility]                                              ?Follow- Up Appointment:  Please call for an appointment to be seen in 2 weeks at [facility]  ?

## 2020-05-06 NOTE — Progress Notes (Signed)
Patient discharged with discharge instructions and follow up appts with personal belongings, IV removed without incident taken to vehicle by w/c to car where son is picking her up

## 2020-05-06 NOTE — Progress Notes (Signed)
Physical Therapy Treatment Patient Details Name: [name] [name] MRN: [id number] DOB: [birth year] Today's Date: 05/06/2020    History of Present Illness 69 y/o female s/p R reverse total shoudler replacement 7/29.    PT Comments    Pt was long sitting in bed upon arriving. She is A and O x 4 and agreeable to PT session. Pt requesting to go to BR to urinate. Pt was able to get out of L side of bed with increased time and min assist. Stood to hemi walker with min A and ambulated to BR. HR increased from 99 bpm to 133 bpm and sao2 > 96% throughout on rm air. Pt visible fatigued and profusely sweating with minimal activity. She does report she was very SOB at home PTA and will have family available 24/7 to assist. Pt will benefit from continued skilled PT at DC to address deficits and improve safe functional mobility.Follow surgeons rec at DC. Pt was seated in recliner with call bell in reach, and polar care reapplied with sling adjusted for comfort and improved positioning.    Follow Up Recommendations  Follow surgeon's recommendation for DC plan and follow-up therapies     Equipment Recommendations  Other (comment) (pt has recieved personal  hemiwalker)    Recommendations for Other Services       Precautions / Restrictions Precautions Precautions: Shoulder;Fall Type of Shoulder Precautions: total Shoulder Interventions: Shoulder sling/immobilizer Precaution Booklet Issued: No Required Braces or Orthoses: Sling Restrictions Weight Bearing Restrictions: Yes RUE Weight Bearing: Non weight bearing    Mobility  Bed Mobility Overal bed mobility: Needs Assistance Bed Mobility: Supine to Sit     Supine to sit: HOB elevated;Min assist     General bed mobility comments: Pt required increased time + min assist to exit L side of bed. pt resting HR 99bpm and sao2 98% on rm air.  Transfers Overall transfer level: Needs assistance Equipment used: Hemi-walker Transfers: Sit to/from  Stand Sit to Stand: Min assist         General transfer comment: Pt performed STS from EOB and from toilet with min assist. vcs for handplacement and increased fwd wt shift.  Ambulation/Gait Ambulation/Gait assistance: Min guard;Min assist Gait Distance (Feet): 20 Feet Assistive device: Hemi-walker Gait Pattern/deviations: Shuffle;Trunk flexed;Decreased stride length (decreased foot clearance during swing) Gait velocity: decreased   General Gait Details: Pt was able to ambulate to BR and return to recliner. very fatigued from minimal activity. HR 133bpm during ambulation with pt profusely sweeating. took ~ 4 minutes seated rest fro HR to drop to 110bpm. sao2 > 96% throughout on rm air.   Stairs             Wheelchair Mobility    Modified Rankin (Stroke Patients Only)       Balance Overall balance assessment: Needs assistance Sitting-balance support: Feet supported;Single extremity supported Sitting balance-Leahy Scale: Good Sitting balance - Comments: no LOB in sitting   Standing balance support: Single extremity supported;During functional activity Standing balance-Leahy Scale: Fair Standing balance comment: pt requires constant CGA-min assist once fatigued to ambulat short distances with LUE holding AD                            Cognition Arousal/Alertness: Awake/alert Behavior During Therapy: WFL for tasks assessed/performed Overall Cognitive Status: Within Functional Limits for tasks assessed  General Comments: Pt is A and O x 4 and cooperative and pleasant throughout.      Exercises      General Comments        Pertinent Vitals/Pain Pain Assessment: No/denies pain    Home Living                      Prior Function            PT Goals (current goals can now be found in the care plan section) Acute Rehab PT Goals Patient Stated Goal: go home Progress towards PT goals:  Progressing toward goals    Frequency    BID      PT Plan Current plan remains appropriate    Co-evaluation              AM-PAC PT "6 Clicks" Mobility   Outcome Measure  Help needed turning from your back to your side while in a flat bed without using bedrails?: A Little Help needed moving from lying on your back to sitting on the side of a flat bed without using bedrails?: A Little Help needed moving to and from a bed to a chair (including a wheelchair)?: A Little Help needed standing up from a chair using your arms (e.g., wheelchair or bedside chair)?: A Little Help needed to walk in hospital room?: A Lot Help needed climbing 3-5 steps with a railing? : A Lot 6 Click Score: 16    End of Session Equipment Utilized During Treatment: Gait belt Activity Tolerance: Patient limited by fatigue Patient left: in chair;with call bell/phone within reach Nurse Communication: Mobility status PT Visit Diagnosis: Muscle weakness (generalized) (M62.81);Unsteadiness on feet (R26.81);Pain;Difficulty in walking, not elsewhere classified (R26.2) Pain - Right/Left: Right Pain - part of body: Shoulder     Time: 3832-9191 PT Time Calculation (min) (ACUTE ONLY): 25 min  Charges:  $Gait Training: 8-22 mins $Therapeutic Activity: 8-22 mins                     Julaine Fusi PTA 05/06/20, 9:16 AM

## 2020-05-06 NOTE — Plan of Care (Signed)
  Problem: Education: Goal: Knowledge of General Education information will improve Description Including pain rating scale, medication(s)/side effects and non-pharmacologic comfort measures Outcome: Progressing   Problem: Health Behavior/Discharge Planning: Goal: Ability to manage health-related needs will improve Outcome: Progressing   Problem: Clinical Measurements: Goal: Ability to maintain clinical measurements within normal limits will improve Outcome: Progressing Goal: Will remain free from infection Outcome: Progressing Goal: Diagnostic test results will improve Outcome: Progressing Goal: Respiratory complications will improve Outcome: Progressing Goal: Cardiovascular complication will be avoided Outcome: Progressing   Problem: Activity: Goal: Risk for activity intolerance will decrease Outcome: Progressing   Problem: Nutrition: Goal: Adequate nutrition will be maintained Outcome: Progressing   Problem: Elimination: Goal: Will not experience complications related to bowel motility Outcome: Progressing Goal: Will not experience complications related to urinary retention Outcome: Progressing   Problem: Pain Managment: Goal: General experience of comfort will improve Outcome: Progressing   

## 2020-05-09 LAB — SURGICAL PATHOLOGY

## 2020-05-27 DIAGNOSIS — M65311 Trigger thumb, right thumb: Secondary | ICD-10-CM | POA: Insufficient documentation

## 2020-06-20 ENCOUNTER — Other Ambulatory Visit: Payer: Self-pay | Admitting: Family Medicine

## 2020-06-20 DIAGNOSIS — R222 Localized swelling, mass and lump, trunk: Secondary | ICD-10-CM

## 2020-06-27 ENCOUNTER — Ambulatory Visit
Admission: RE | Admit: 2020-06-27 | Discharge: 2020-06-27 | Disposition: A | Discharge status: Home / Self Care | Payer: Medicare Other | Source: Ambulatory Visit | Attending: Family Medicine | Admitting: Family Medicine

## 2020-06-27 ENCOUNTER — Other Ambulatory Visit: Payer: Self-pay

## 2020-06-27 DIAGNOSIS — R222 Localized swelling, mass and lump, trunk: Secondary | ICD-10-CM

## 2020-07-04 ENCOUNTER — Other Ambulatory Visit: Payer: Self-pay | Admitting: Internal Medicine

## 2020-07-04 DIAGNOSIS — R222 Localized swelling, mass and lump, trunk: Secondary | ICD-10-CM

## 2020-07-04 DIAGNOSIS — R1900 Intra-abdominal and pelvic swelling, mass and lump, unspecified site: Secondary | ICD-10-CM

## 2020-07-15 ENCOUNTER — Ambulatory Visit
Admission: RE | Admit: 2020-07-15 | Discharge: 2020-07-15 | Disposition: A | Discharge status: Home / Self Care | Payer: Medicare Other | Source: Ambulatory Visit | Attending: Internal Medicine | Admitting: Internal Medicine

## 2020-07-15 ENCOUNTER — Other Ambulatory Visit: Payer: Self-pay

## 2020-07-15 DIAGNOSIS — R222 Localized swelling, mass and lump, trunk: Secondary | ICD-10-CM | POA: Insufficient documentation

## 2020-07-15 DIAGNOSIS — R1900 Intra-abdominal and pelvic swelling, mass and lump, unspecified site: Secondary | ICD-10-CM | POA: Diagnosis present

## 2020-07-15 MED ORDER — GADOBUTROL 1 MMOL/ML IV SOLN
9.0000 mL | Freq: Once | INTRAVENOUS | Status: AC | PRN
  Administered 2020-07-15: 9 mL via INTRAVENOUS

## 2020-07-21 ENCOUNTER — Ambulatory Visit: Payer: Medicare Other

## 2020-07-29 ENCOUNTER — Other Ambulatory Visit: Payer: Self-pay

## 2020-07-29 DIAGNOSIS — D649 Anemia, unspecified: Secondary | ICD-10-CM

## 2020-07-29 DIAGNOSIS — D631 Anemia in chronic kidney disease: Secondary | ICD-10-CM

## 2020-07-29 DIAGNOSIS — N1832 Chronic kidney disease, stage 3b: Secondary | ICD-10-CM

## 2020-08-01 ENCOUNTER — Inpatient Hospital Stay: Payer: Medicare Other | Attending: Internal Medicine

## 2020-08-01 ENCOUNTER — Inpatient Hospital Stay: Payer: Medicare Other | Admitting: Internal Medicine

## 2020-08-01 ENCOUNTER — Inpatient Hospital Stay: Payer: Medicare Other

## 2020-08-13 ENCOUNTER — Emergency Department: Payer: Medicare Other

## 2020-08-13 ENCOUNTER — Encounter: Payer: Self-pay | Admitting: Emergency Medicine

## 2020-08-13 ENCOUNTER — Other Ambulatory Visit: Payer: Self-pay

## 2020-08-13 ENCOUNTER — Emergency Department
Admission: EM | Admit: 2020-08-13 | Discharge: 2020-08-13 | Disposition: A | Discharge status: Home / Self Care | Payer: Medicare Other | Attending: Emergency Medicine | Admitting: Emergency Medicine

## 2020-08-13 DIAGNOSIS — R000 Tachycardia, unspecified: Secondary | ICD-10-CM | POA: Diagnosis not present

## 2020-08-13 DIAGNOSIS — Z8673 Personal history of transient ischemic attack (TIA), and cerebral infarction without residual deficits: Secondary | ICD-10-CM | POA: Diagnosis not present

## 2020-08-13 DIAGNOSIS — K5790 Diverticulosis of intestine, part unspecified, without perforation or abscess without bleeding: Secondary | ICD-10-CM | POA: Diagnosis not present

## 2020-08-13 DIAGNOSIS — I129 Hypertensive chronic kidney disease with stage 1 through stage 4 chronic kidney disease, or unspecified chronic kidney disease: Secondary | ICD-10-CM | POA: Insufficient documentation

## 2020-08-13 DIAGNOSIS — E1122 Type 2 diabetes mellitus with diabetic chronic kidney disease: Secondary | ICD-10-CM | POA: Diagnosis not present

## 2020-08-13 DIAGNOSIS — R1110 Vomiting, unspecified: Secondary | ICD-10-CM | POA: Diagnosis present

## 2020-08-13 DIAGNOSIS — Z79899 Other long term (current) drug therapy: Secondary | ICD-10-CM | POA: Diagnosis not present

## 2020-08-13 DIAGNOSIS — Z7984 Long term (current) use of oral hypoglycemic drugs: Secondary | ICD-10-CM | POA: Insufficient documentation

## 2020-08-13 DIAGNOSIS — N2889 Other specified disorders of kidney and ureter: Secondary | ICD-10-CM | POA: Diagnosis not present

## 2020-08-13 DIAGNOSIS — R634 Abnormal weight loss: Secondary | ICD-10-CM | POA: Insufficient documentation

## 2020-08-13 DIAGNOSIS — I7090 Unspecified atherosclerosis: Secondary | ICD-10-CM | POA: Insufficient documentation

## 2020-08-13 DIAGNOSIS — Z87891 Personal history of nicotine dependence: Secondary | ICD-10-CM | POA: Diagnosis not present

## 2020-08-13 DIAGNOSIS — R1111 Vomiting without nausea: Secondary | ICD-10-CM

## 2020-08-13 DIAGNOSIS — N1832 Chronic kidney disease, stage 3b: Secondary | ICD-10-CM | POA: Insufficient documentation

## 2020-08-13 DIAGNOSIS — Z794 Long term (current) use of insulin: Secondary | ICD-10-CM | POA: Insufficient documentation

## 2020-08-13 DIAGNOSIS — Z7982 Long term (current) use of aspirin: Secondary | ICD-10-CM | POA: Diagnosis not present

## 2020-08-13 DIAGNOSIS — Z20822 Contact with and (suspected) exposure to covid-19: Secondary | ICD-10-CM | POA: Insufficient documentation

## 2020-08-13 DIAGNOSIS — J449 Chronic obstructive pulmonary disease, unspecified: Secondary | ICD-10-CM | POA: Diagnosis not present

## 2020-08-13 DIAGNOSIS — E860 Dehydration: Secondary | ICD-10-CM | POA: Diagnosis not present

## 2020-08-13 LAB — CBC
HCT: 36.8 % (ref 36.0–46.0)
Hemoglobin: 11.6 g/dL — ABNORMAL LOW (ref 12.0–15.0)
MCH: 28 pg (ref 26.0–34.0)
MCHC: 31.5 g/dL (ref 30.0–36.0)
MCV: 88.7 fL (ref 80.0–100.0)
Platelets: 299 10*3/uL (ref 150–400)
RBC: 4.15 MIL/uL (ref 3.87–5.11)
RDW: 16.1 % — ABNORMAL HIGH (ref 11.5–15.5)
WBC: 10 10*3/uL (ref 4.0–10.5)
nRBC: 0 % (ref 0.0–0.2)

## 2020-08-13 LAB — RESPIRATORY PANEL BY RT PCR (FLU A&B, COVID)
Influenza A by PCR: NEGATIVE
Influenza B by PCR: NEGATIVE
SARS Coronavirus 2 by RT PCR: NEGATIVE

## 2020-08-13 LAB — MAGNESIUM: Magnesium: 1.2 mg/dL — ABNORMAL LOW (ref 1.7–2.4)

## 2020-08-13 LAB — COMPREHENSIVE METABOLIC PANEL
ALT: 9 U/L (ref 0–44)
AST: 24 U/L (ref 15–41)
Albumin: 3.6 g/dL (ref 3.5–5.0)
Alkaline Phosphatase: 80 U/L (ref 38–126)
Anion gap: 16 — ABNORMAL HIGH (ref 5–15)
BUN: 17 mg/dL (ref 8–23)
CO2: 21 mmol/L — ABNORMAL LOW (ref 22–32)
Calcium: 10 mg/dL (ref 8.9–10.3)
Chloride: 101 mmol/L (ref 98–111)
Creatinine, Ser: 1.13 mg/dL — ABNORMAL HIGH (ref 0.44–1.00)
GFR, Estimated: 53 mL/min — ABNORMAL LOW (ref >60)
Glucose, Bld: 151 mg/dL — ABNORMAL HIGH (ref 70–99)
Potassium: 3.5 mmol/L (ref 3.5–5.1)
Sodium: 138 mmol/L (ref 135–145)
Total Bilirubin: 1 mg/dL (ref 0.3–1.2)
Total Protein: 6.9 g/dL (ref 6.5–8.1)

## 2020-08-13 LAB — HEMOGLOBIN A1C
Hgb A1c MFr Bld: 5.7 % — ABNORMAL HIGH (ref 4.8–5.6)
Mean Plasma Glucose: 116.89 mg/dL

## 2020-08-13 LAB — CBG MONITORING, ED: Glucose-Capillary: 140 mg/dL — ABNORMAL HIGH (ref 70–99)

## 2020-08-13 LAB — BLOOD GAS, VENOUS
Acid-base deficit: 1.3 mmol/L (ref 0.0–2.0)
Bicarbonate: 24.3 mmol/L (ref 20.0–28.0)
O2 Saturation: 62.5 %
Patient temperature: 37
pCO2, Ven: 43 mmHg — ABNORMAL LOW (ref 44.0–60.0)
pH, Ven: 7.36 (ref 7.250–7.430)
pO2, Ven: 34 mmHg (ref 32.0–45.0)

## 2020-08-13 LAB — TROPONIN I (HIGH SENSITIVITY): Troponin I (High Sensitivity): 14 ng/L (ref <18)

## 2020-08-13 LAB — LIPASE, BLOOD: Lipase: 29 U/L (ref 11–51)

## 2020-08-13 MED ORDER — ONDANSETRON HCL 4 MG PO TABS: 4.0000 mg | ORAL_TABLET | Freq: Three times a day (TID) | ORAL | 0 refills | Status: DC | PRN

## 2020-08-13 MED ORDER — MAGNESIUM SULFATE 2 GM/50ML IV SOLN
2.0000 g | Freq: Once | INTRAVENOUS | Status: AC
  Administered 2020-08-13: 2 g via INTRAVENOUS
  Filled 2020-08-13: qty 50

## 2020-08-13 MED ORDER — LACTATED RINGERS IV BOLUS
1000.0000 mL | Freq: Once | INTRAVENOUS | Status: AC
  Administered 2020-08-13: 1000 mL via INTRAVENOUS

## 2020-08-13 MED ORDER — ONDANSETRON 4 MG PO TBDP: 4.0000 mg | ORAL_TABLET | ORAL | Status: DC | PRN

## 2020-08-13 MED ORDER — INSULIN ASPART 100 UNIT/ML ~~LOC~~ SOLN
0.0000 [IU] | SUBCUTANEOUS | Status: DC
  Administered 2020-08-13: 2 [IU] via SUBCUTANEOUS
  Filled 2020-08-13: qty 1

## 2020-08-13 MED ORDER — IOHEXOL 300 MG/ML  SOLN
100.0000 mL | Freq: Once | INTRAMUSCULAR | Status: AC | PRN
  Administered 2020-08-13: 100 mL via INTRAVENOUS

## 2020-08-13 NOTE — Discharge Instructions (Signed)
Your Abdominal CT today showed: 1. No acute findings are noted in the abdomen or pelvis to account  for the patient's symptoms.  2. Extensive colonic diverticulosis without evidence of acute  diverticulitis at this time.  3. Normal appendix.  4. 1.4 x 1.0 cm indeterminate lesion in the upper pole of the right  kidney. Further characterization with nonemergent abdominal MRI with  and without IV gadolinium is recommended in the near future to  provide definitive characterization.  5. Multiple tiny cystic appearing lesions scattered throughout the  pancreas, with evidence of chronic pancreatitis, as above. 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  6. Aortic atherosclerosis.

## 2020-08-13 NOTE — ED Provider Notes (Signed)
[facility] Emergency Department Provider Note  ____________________________________________   First MD Initiated Contact with Patient 08/13/20 1024     (approximate)  I have reviewed the triage vital signs and the nursing notes.   HISTORY  Chief Complaint Emesis and Weight Loss   HPI [name] [name] is a 69 y.o. female with a past medical history of COPD, CKD, GERD, DM, pulmonary fibrosis, TIA, asthma, and anemia presents for assessment of slightly greater than 1 month of greater than 30 pound weight loss associated with daily nonbloody nonbilious vomiting.  Patient states anything she attempts to eat she vomits.  She is able to take her medicines and swallow some fluids.  She states she was going to see her PCP but her PCP referred to emergency room for initial valuation.  She has endorses approximate 1 week of cough denies any chest pain, shortness of breath, abdominal pain, constipation, diarrhea, dysuria, blood in her stool, blood in her urine, back pain, focal extremity pain, headache, earache, sore throat, or other acute complaints.  Denies EtOH use, significant NSAID use, tobacco use.         Past Medical History:  Diagnosis Date  . Anemia   . Asthma   . Chronic airway obstruction ([place])   . Chronic kidney disease    F/U WITH DR LATEEF  . Diabetes mellitus without complication ([place])   . Dyspnea    WITH EXERTION  . GERD (gastroesophageal reflux disease)   . Heart murmur    ASYMPTOMATIC  . History of adenomatous polyp of colon   . History of bone density study   . Hyperlipidemia   . Hypertension   . Idiopathic peripheral neuropathy   . Morbid obesity ([place])   . Osteoporosis   . Plantar fascial fibromatosis   . Pulmonary fibrosis ([place])   . 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 TIA (transient ischemic attack)     Patient Active Problem List   Diagnosis Date Noted  . Status post reverse total shoulder replacement,  right 05/05/2020  . Normocytic anemia 04/04/2020  . Cellulitis 01/03/2020  . Cellulitis of right arm 01/02/2020  . Sepsis ([place]) 01/02/2020  . Asthma 01/02/2020  . HTN (hypertension) 01/02/2020  . Anemia due to stage 3b chronic kidney disease ([place]) 12/22/2019  . Pedal edema 12/01/2019  . Fall 11/18/2019  . Closed rib fracture 11/18/2019  . Pulmonary fibrosis ([place])   . Asthma exacerbation   . Acute renal failure superimposed on stage 3a chronic kidney disease ([place])   . Chronic pain syndrome 09/15/2018  . AVD (aortic valve disease) 10/07/2017  . Benign essential hypertension 06/17/2017  . Nonrheumatic aortic valve insufficiency 06/17/2017  . CKD (chronic kidney disease), stage IIIa 05/20/2017  . Wound dehiscence 11/05/2016  . Lumbar stenosis 10/15/2016  . Renal impairment 10/05/2016  . Right lumbar radiculopathy 10/05/2016  . Complete tear of right rotator cuff 11/28/2015  . Rotator cuff arthropathy, right 11/28/2015  . Low vitamin D level 02/28/2015  . Gastroesophageal reflux disease without esophagitis 12/14/2014  . Morbid obesity with BMI of 40.0-44.9, adult ([place]) 10/06/2014  . Aortic stenosis 09/14/2014  . DOE (dyspnea on exertion) 09/08/2014  . History of adenomatous polyp of colon 09/08/2014  . Type II diabetes mellitus with renal manifestations ([place]) 03/22/2014  . Hyperlipidemia 03/22/2014    Past Surgical History:  Procedure Laterality Date  . ABDOMINAL HYSTERECTOMY    . BACK SURGERY    . BLADDER  SUSPENSION    . CESAREAN SECTION    . COLONOSCOPY    . DILATION AND CURETTAGE OF UTERUS    . ESOPHAGOGASTRODUODENOSCOPY    . LUMBAR LAMINECTOMY/DECOMPRESSION MICRODISCECTOMY N/A 10/15/2016   Procedure: L3-L5 Laminectomy ;  Surgeon: Deetta Perla, MD;  Location: [facility] ORS;  Service: Neurosurgery;  Laterality: N/A;  . LUMBAR WOUND DEBRIDEMENT N/A 11/05/2016   Procedure: LUMBAR WOUND DEBRIDEMENT;  Surgeon: Deetta Perla, MD;  Location: [facility] ORS;  Service: Neurosurgery;  Laterality:  N/A;  . REVERSE SHOULDER ARTHROPLASTY Right 05/05/2020   Procedure: REVERSE SHOULDER ARTHROPLASTY;  Surgeon: Corky Mull, MD;  Location: [facility] ORS;  Service: Orthopedics;  Laterality: Right;  . TUBAL LIGATION      Prior to Admission medications   Medication Sig Start Date End Date Taking? Authorizing Provider  allopurinol (ZYLOPRIM) 100 MG tablet Take 100 mg by mouth every morning.     [provider]  aspirin EC 325 MG tablet Take 1 tablet (325 mg total) by mouth daily. 05/06/20   Lattie Corns, PA-C  clotrimazole-betamethasone (LOTRISONE) cream Apply 1 application topically 2 (two) times daily as needed (itching).  01/28/20   [provider]  cyclobenzaprine (FLEXERIL) 5 MG tablet Take 5 mg by mouth 2 (two) times daily as needed for muscle spasms.    [provider]  DULoxetine (CYMBALTA) 30 MG capsule Take 30 mg by mouth every morning.  07/17/19   [provider]  esomeprazole (NEXIUM) 40 MG capsule Take 40 mg by mouth every morning.     [provider]  ferrous sulfate 325 (65 FE) MG tablet Take 1 tablet (325 mg total) by mouth 2 (two) times daily with a meal. Patient taking differently: Take 325 mg by mouth daily with breakfast.  11/24/19   Nicole Kindred A, DO  glimepiride (AMARYL) 2 MG tablet Take 2 mg by mouth every morning. 07/23/19   [provider]  losartan (COZAAR) 50 MG tablet Take 50 mg by mouth every morning.     [provider]  metFORMIN (GLUCOPHAGE) 500 MG tablet Take 500 mg by mouth daily with breakfast.     [provider]  mometasone-formoterol (DULERA) 100-5 MCG/ACT AERO Inhale 2 puffs into the lungs daily as needed for wheezing or shortness of breath.  01/28/20 01/27/21  [provider]  oxyCODONE (OXY IR/ROXICODONE) 5 MG immediate release tablet Take 1-2 tablets (5-10 mg total) by mouth every 4 (four) hours as needed for moderate pain. 05/06/20   Lattie Corns, PA-C  simvastatin  (ZOCOR) 20 MG tablet Take 20 mg by mouth every morning.     [provider]    Allergies Acetaminophen-codeine, Codeine, Furosemide, Penicillin v potassium, Quinine, Tramadol, and Tylenol [acetaminophen]  Family History  Problem Relation Age of Onset  . Stroke Mother   . Heart attack Mother   . Breast cancer Sister 19  . Stroke Sister   . Heart attack Sister   . Breast cancer Cousin        maternal side 4's    Social History Social History   Tobacco Use  . Smoking status: Former Smoker    Packs/day: 3.00    Years: 30.00    Pack years: 90.00    Types: Cigarettes    Quit date: 04/28/2000    Years since quitting: 20.3  . Smokeless tobacco: Never Used  Vaping Use  . Vaping Use: Never used  Substance Use Topics  . Alcohol use: No  . Drug use: No  Review of Systems  Review of Systems  Constitutional: Positive for malaise/fatigue and weight loss. Negative for chills and fever.  HENT: Negative for sore throat.   Eyes: Negative for pain.  Respiratory: Positive for cough. Negative for stridor.   Cardiovascular: Negative for chest pain.  Gastrointestinal: Positive for nausea and vomiting.  Genitourinary: Negative for dysuria.  Musculoskeletal: Negative for myalgias.  Skin: Negative for rash.  Neurological: Negative for seizures, loss of consciousness and headaches.  Psychiatric/Behavioral: Negative for suicidal ideas.  All other systems reviewed and are negative.     ____________________________________________   PHYSICAL EXAM:  VITAL SIGNS: ED Triage Vitals  Enc Vitals Group     BP 08/13/20 0901 (!) 153/71     Pulse Rate 08/13/20 0901 (!) 109     Resp 08/13/20 0901 16     Temp 08/13/20 0901 97.7 F (36.5 C)     Temp Source 08/13/20 0901 Oral     SpO2 08/13/20 0901 100 %     Weight 08/13/20 0902 173 lb (78.5 kg)     Height 08/13/20 0902 4\' 10"  (1.473 m)     Head Circumference --      Peak Flow --      Pain Score 08/13/20 0902 0     Pain Loc --       Pain Edu? --      Excl. in [place]? --    Vitals:   08/13/20 0901 08/13/20 1200  BP: (!) 153/71 (!) 122/59  Pulse: (!) 109 77  Resp: 16 (!) 23  Temp: 97.7 F (36.5 C)   SpO2: 100% 100%   Physical Exam Vitals and nursing note reviewed.  Constitutional:      General: She is not in acute distress.    Appearance: She is well-developed.  HENT:     Head: Normocephalic and atraumatic.     Right Ear: External ear normal.     Left Ear: External ear normal.     Nose: Nose normal.     Mouth/Throat:     Mouth: Mucous membranes are dry.  Eyes:     Conjunctiva/sclera: Conjunctivae normal.  Cardiovascular:     Rate and Rhythm: Regular rhythm. Tachycardia present.     Heart sounds: No murmur heard.   Pulmonary:     Effort: Pulmonary effort is normal. No respiratory distress.     Breath sounds: Normal breath sounds.  Abdominal:     Palpations: Abdomen is soft.     Tenderness: There is no abdominal tenderness.  Musculoskeletal:     Cervical back: Neck supple.  Skin:    General: Skin is warm and dry.     Capillary Refill: Capillary refill takes 2 to 3 seconds.  Neurological:     Mental Status: She is alert and oriented to person, place, and time.  Psychiatric:        Mood and Affect: Mood normal.      ____________________________________________   LABS (all labs ordered are listed, but only abnormal results are displayed)  Labs Reviewed  COMPREHENSIVE METABOLIC PANEL - Abnormal; Notable for the following components:      Result Value   CO2 21 (*)    Glucose, Bld 151 (*)    Creatinine, Ser 1.13 (*)    GFR, Estimated 53 (*)    Anion gap 16 (*)    All other components within normal limits  CBC - Abnormal; Notable for the following components:   Hemoglobin 11.6 (*)    RDW 16.1 (*)  All other components within normal limits  BLOOD GAS, VENOUS - Abnormal; Notable for the following components:   pCO2, Ven 43 (*)    All other components within normal limits  MAGNESIUM -  Abnormal; Notable for the following components:   Magnesium 1.2 (*)    All other components within normal limits  CBG MONITORING, ED - Abnormal; Notable for the following components:   Glucose-Capillary 140 (*)    All other components within normal limits  RESPIRATORY PANEL BY RT PCR (FLU A&B, COVID)  LIPASE, BLOOD  URINALYSIS, COMPLETE (UACMP) WITH MICROSCOPIC  HEMOGLOBIN A1C  TROPONIN I (HIGH SENSITIVITY)   ____________________________________________  EKG  Sinus rhythm with a ventricular rate of 97, normal axis, unremarkable intervals, T wave inversion in lead III and aVF nonspecific changes in V5 and V6 are all present on prior EKGs obtained in August.  No evidence of acute ischemia of significant Arrhythmia. ____________________________________________  RADIOLOGY  ED MD interpretation: Chest x-ray shows signs of focal consolidation, effusion, edema, or other acute intrathoracic process.  Official radiology report(s): DG Chest 2 View  Result Date: 08/13/2020 CLINICAL DATA:  Cough, nonsmoker, weight loss, vomiting EXAM: CHEST - 2 VIEW COMPARISON:  11/18/2019 chest radiograph. FINDINGS: Partially visualized right total shoulder arthroplasty. Stable cardiomediastinal silhouette with normal heart size. No pneumothorax. No pleural effusion. Lungs appear clear, with no acute consolidative airspace disease and no pulmonary edema. IMPRESSION: No active cardiopulmonary disease. Electronically Signed   By: Ilona Sorrel M.D.   On: 08/13/2020 12:18   CT ABDOMEN PELVIS W CONTRAST  Result Date: 08/13/2020 CLINICAL DATA:  69 year old female with history of vomiting and weight loss. Abdominal distension. EXAM: CT ABDOMEN AND PELVIS WITH CONTRAST TECHNIQUE: Multidetector CT imaging of the abdomen and pelvis was performed using the standard protocol following bolus administration of intravenous contrast. CONTRAST:  167mL OMNIPAQUE IOHEXOL 300 MG/ML  SOLN COMPARISON:  CT of the abdomen and pelvis  02/25/2020. FINDINGS: Lower chest: Aortic atherosclerosis. Hepatobiliary: No suspicious cystic or solid hepatic lesions. No intra or extrahepatic biliary ductal dilatation. Gallbladder is normal in appearance. Pancreas: Numerous coarse calcifications are noted in the head and uncinate process of the pancreas, likely sequela of chronic pancreatitis. Several low-attenuation lesions are noted in the pancreas in the pancreatic head (axial image 26 of series 2 measuring 1.1 x 0.5 cm), body (axial image 21 of series 2 measuring 1.0 x 0.7 cm), and distal body (axial image 25 of series 2 measuring 8 mm). No definite solid pancreatic mass. No peripancreatic fluid collections or inflammatory changes. Spleen: Unremarkable. Adrenals/Urinary Tract: In the upper pole of the right kidney (axial image 17 of series 2) there is a 1.4 x 1.0 cm low-intermediate attenuation lesion (23 HU) which may have some internal septations best appreciated on coronal image 58 of series 5, considered indeterminate. Left kidney and bilateral adrenal glands are normal in appearance. No hydroureteronephrosis. Urinary bladder is normal in appearance. Stomach/Bowel: Normal appearance of the stomach. No pathologic dilatation of small bowel or colon. Numerous colonic diverticulae are noted, particularly in the sigmoid colon, without surrounding inflammatory changes to suggest an acute diverticulitis at this time. Normal appendix. Vascular/Lymphatic: Aortic atherosclerosis, without evidence of aneurysm or dissection in the abdominal or pelvic vasculature. No lymphadenopathy noted in the abdomen or pelvis. Reproductive: Status post hysterectomy. Ovaries are not confidently identified may be surgically absent or atrophic. Other: No significant volume of ascites.  No pneumoperitoneum. Musculoskeletal: There are no aggressive appearing lytic or blastic lesions noted in the visualized portions  of the skeleton. IMPRESSION: 1. No acute findings are noted in the  abdomen or pelvis to account for the patient's symptoms. 2. Extensive colonic diverticulosis without evidence of acute diverticulitis at this time. 3. Normal appendix. 4. 1.4 x 1.0 cm indeterminate lesion in the upper pole of the right kidney. Further characterization with nonemergent abdominal MRI with and without IV gadolinium is recommended in the near future to provide definitive characterization. 5. Multiple tiny cystic appearing lesions scattered throughout the pancreas, with evidence of chronic pancreatitis, as above. These are favored to represent small pancreatic pseudocysts, however, attention at time of forthcoming abdominal MRI with and without IV gadolinium is recommended to better evaluate these lesions. 6. Aortic atherosclerosis. Electronically Signed   By: Vinnie Langton M.D.   On: 08/13/2020 12:10    ____________________________________________   PROCEDURES  Procedure(s) performed (including Critical Care):  .1-3 Lead EKG Interpretation Performed by: Lucrezia Starch, MD Authorized by: Lucrezia Starch, MD     Interpretation: normal     ECG rate assessment: normal     Rhythm: sinus rhythm     Ectopy: none     Conduction: normal       ____________________________________________   INITIAL IMPRESSION / ASSESSMENT AND PLAN / ED COURSE        Patient presents with above-stated exam for assessment of several months of daily vomiting associated with greater than 30 pound weight loss over the past month.  On arrival patient is tachycardic with heart rate of 109, hypertensive with a BP of 153/71 with otherwise stable vital signs on room air.  Differential includes but is not limited to infectious versus inflammatory gastritis, gastroparesis, partial SBO, gastrointestinal malignancy, other malignancy, worsening of anemia, poorly controlled diabetes, and other metabolic derangements.  No history of toxic ingestion or traumatic injury.  Patient appears dehydrated on exam but  not septic or toxic.  Other than a cough she denies any other acute infectious symptoms.  ECG and troponin are reassuring and not consistent with ACS or cardiac etiologies.  Dizziness noted to be low and this was repleted.  Patient appears clinically dehydrated which is given IV fluids.  No evidence on CMP of significant electrolyte or metabolic derangement or cholestasis.  Glucose is slightly elevated although there is no evidence of DKA on VBG.  Patient was given 1 dose of insulin in addition to IV fluids in the ED.  Suspect patient's slight anion gap secondary to some dehydration.  Lipase not consistent with pancreatic inflammation.   CT of the patient's abdomen pelvis shows no evidence of acute intraabdominopelvic process.  In addition her chest x-ray is unremarkable for evidence of pneumonia.  She is noted to have some incidental findings including right renal mass advised patient about recommended she follow-up with her PCP for.  Given improvement in vital signs likely secondary to some dehydration with otherwise reassuring exam and work-up and duration of symptoms I believe she is safe for discharge with plan for continued outpatient PCP evaluation.  Patient discharged stable condition.  Strict return precautions advised and discussed.   ____________________________________________   FINAL CLINICAL IMPRESSION(S) / ED DIAGNOSES  Final diagnoses:  Vomiting without nausea, intractability of vomiting not specified, unspecified vomiting type  Dehydration  Hypomagnesemia  Diverticulosis  Atherosclerosis  Right kidney mass    Medications  insulin aspart (novoLOG) injection 0-15 Units (2 Units Subcutaneous Given 08/13/20 1100)  ondansetron (ZOFRAN-ODT) disintegrating tablet 4 mg (has no administration in time range)  magnesium sulfate IVPB 2 g  50 mL (2 g Intravenous New Bag/Given 08/13/20 1153)  lactated ringers bolus 1,000 mL (1,000 mLs Intravenous New Bag/Given 08/13/20 1042)  iohexol  (OMNIPAQUE) 300 MG/ML solution 100 mL (100 mLs Intravenous Contrast Given 08/13/20 1122)     ED Discharge Orders    None       Note:  This document was prepared using Dragon voice recognition software and may include unintentional dictation errors.   Lucrezia Starch, MD 08/13/20 1240

## 2020-08-13 NOTE — ED Notes (Signed)
Pt transported to CT at this time.

## 2020-08-13 NOTE — ED Notes (Signed)
Pt IV fluids still infusing, will DC once finished.

## 2020-08-13 NOTE — ED Triage Notes (Signed)
Pt to ED via POV c/o vomiting and weight loss. Pt states that over the last few months she has lost about 20 pounds. Pt states that she called her PCP yesterday and they told her that she needed to come to the ED. Pt is in NAD.

## 2020-08-13 NOTE — ED Notes (Signed)
Pt states that she is unable to give urine sample at this time.

## 2020-08-17 ENCOUNTER — Other Ambulatory Visit: Payer: Self-pay

## 2020-08-17 ENCOUNTER — Encounter: Payer: Self-pay | Admitting: Emergency Medicine

## 2020-08-17 ENCOUNTER — Emergency Department
Admission: EM | Admit: 2020-08-17 | Discharge: 2020-08-17 | Disposition: A | Discharge status: Home / Self Care | Payer: Medicare Other | Attending: Emergency Medicine | Admitting: Emergency Medicine

## 2020-08-17 DIAGNOSIS — Z96611 Presence of right artificial shoulder joint: Secondary | ICD-10-CM | POA: Insufficient documentation

## 2020-08-17 DIAGNOSIS — Z79899 Other long term (current) drug therapy: Secondary | ICD-10-CM | POA: Insufficient documentation

## 2020-08-17 DIAGNOSIS — I129 Hypertensive chronic kidney disease with stage 1 through stage 4 chronic kidney disease, or unspecified chronic kidney disease: Secondary | ICD-10-CM | POA: Diagnosis not present

## 2020-08-17 DIAGNOSIS — E860 Dehydration: Secondary | ICD-10-CM | POA: Diagnosis not present

## 2020-08-17 DIAGNOSIS — E162 Hypoglycemia, unspecified: Secondary | ICD-10-CM

## 2020-08-17 DIAGNOSIS — Z794 Long term (current) use of insulin: Secondary | ICD-10-CM | POA: Insufficient documentation

## 2020-08-17 DIAGNOSIS — J45909 Unspecified asthma, uncomplicated: Secondary | ICD-10-CM | POA: Insufficient documentation

## 2020-08-17 DIAGNOSIS — R110 Nausea: Secondary | ICD-10-CM

## 2020-08-17 DIAGNOSIS — N183 Chronic kidney disease, stage 3 unspecified: Secondary | ICD-10-CM | POA: Insufficient documentation

## 2020-08-17 DIAGNOSIS — R739 Hyperglycemia, unspecified: Secondary | ICD-10-CM | POA: Insufficient documentation

## 2020-08-17 LAB — COMPREHENSIVE METABOLIC PANEL
ALT: 10 U/L (ref 0–44)
AST: 30 U/L (ref 15–41)
Albumin: 3.4 g/dL — ABNORMAL LOW (ref 3.5–5.0)
Alkaline Phosphatase: 83 U/L (ref 38–126)
Anion gap: 12 (ref 5–15)
BUN: 18 mg/dL (ref 8–23)
CO2: 20 mmol/L — ABNORMAL LOW (ref 22–32)
Calcium: 9.5 mg/dL (ref 8.9–10.3)
Chloride: 103 mmol/L (ref 98–111)
Creatinine, Ser: 1.53 mg/dL — ABNORMAL HIGH (ref 0.44–1.00)
GFR, Estimated: 37 mL/min — ABNORMAL LOW (ref >60)
Glucose, Bld: 269 mg/dL — ABNORMAL HIGH (ref 70–99)
Potassium: 3.9 mmol/L (ref 3.5–5.1)
Sodium: 135 mmol/L (ref 135–145)
Total Bilirubin: 1.2 mg/dL (ref 0.3–1.2)
Total Protein: 6.7 g/dL (ref 6.5–8.1)

## 2020-08-17 LAB — CBG MONITORING, ED
Glucose-Capillary: 101 mg/dL — ABNORMAL HIGH (ref 70–99)
Glucose-Capillary: 110 mg/dL — ABNORMAL HIGH (ref 70–99)
Glucose-Capillary: 68 mg/dL — ABNORMAL LOW (ref 70–99)
Glucose-Capillary: 76 mg/dL (ref 70–99)
Glucose-Capillary: 81 mg/dL (ref 70–99)
Glucose-Capillary: 91 mg/dL (ref 70–99)

## 2020-08-17 LAB — CBC WITH DIFFERENTIAL/PLATELET
Abs Immature Granulocytes: 0.03 10*3/uL (ref 0.00–0.07)
Basophils Absolute: 0.1 10*3/uL (ref 0.0–0.1)
Basophils Relative: 1 %
Eosinophils Absolute: 0.9 10*3/uL — ABNORMAL HIGH (ref 0.0–0.5)
Eosinophils Relative: 11 %
HCT: 34.4 % — ABNORMAL LOW (ref 36.0–46.0)
Hemoglobin: 10.9 g/dL — ABNORMAL LOW (ref 12.0–15.0)
Immature Granulocytes: 0 %
Lymphocytes Relative: 21 %
Lymphs Abs: 1.6 10*3/uL (ref 0.7–4.0)
MCH: 28 pg (ref 26.0–34.0)
MCHC: 31.7 g/dL (ref 30.0–36.0)
MCV: 88.4 fL (ref 80.0–100.0)
Monocytes Absolute: 0.9 10*3/uL (ref 0.1–1.0)
Monocytes Relative: 12 %
Neutro Abs: 4.2 10*3/uL (ref 1.7–7.7)
Neutrophils Relative %: 55 %
Platelets: 252 10*3/uL (ref 150–400)
RBC: 3.89 MIL/uL (ref 3.87–5.11)
RDW: 16.4 % — ABNORMAL HIGH (ref 11.5–15.5)
WBC: 7.7 10*3/uL (ref 4.0–10.5)
nRBC: 0 % (ref 0.0–0.2)

## 2020-08-17 MED ORDER — ONDANSETRON HCL 4 MG/2ML IJ SOLN
4.0000 mg | Freq: Once | INTRAMUSCULAR | Status: AC
  Administered 2020-08-17: 4 mg via INTRAVENOUS

## 2020-08-17 MED ORDER — ONDANSETRON 4 MG PO TBDP: ORAL_TABLET | ORAL | 2 refills | Status: DC

## 2020-08-17 MED ORDER — SODIUM CHLORIDE 0.9 % IV BOLUS
1000.0000 mL | Freq: Once | INTRAVENOUS | Status: AC
  Administered 2020-08-17: 1000 mL via INTRAVENOUS

## 2020-08-17 NOTE — ED Notes (Signed)
Pt given orange juice.

## 2020-08-17 NOTE — ED Triage Notes (Addendum)
Pt to ED via EMS from home c/o AMS.  Pt found to have CBG 40 by EMS.  Given 240mL D10 by EMS with CBG after of 102 and then 120.  Initially found to have slurred speech and facial droop but these have since resolved along with orientation and mentation.  Pt A&Ox4, chest rise even and unlabored, denies pain.  Hx DBM and took her metformin and amaryl.

## 2020-08-17 NOTE — ED Notes (Signed)
Son at bedside. Son outside room to notify RN that pt was vomiting. Pt had one episode of emesis, light orange in color. Pt denies any nausea prior to attempting to eat [place] sandwich. Pt and son state pt has been unable to eat in the past few weeks without taking ODT zofran. Pt denies any nausea following episode of emesis.

## 2020-08-17 NOTE — ED Notes (Signed)
Pt given additional 8 oz of orange juice and applesauce.

## 2020-08-17 NOTE — Discharge Instructions (Signed)
As we discussed, your work-up was generally reassuring today.  It seems that your blood glucose dropped too low because you have not been eating or drinking as much as usual due to your nausea, and your diabetes medicine dropped her blood glucose too much.  Please make sure to try to eat or drink something when taking her diabetes medicine.  I recommend you not take your medicine this morning.  We provided a prescription for nausea medicine which she can use according to label instructions and as needed to help with your symptoms.  I recommend you follow-up with your primary care doctor to discuss whether or not a change in your diabetes medicine would be appropriate given your decreased appetite and ability to eat consistently.  You may want to try drinking nutrition supplements such as Ensure, particularly when you are taking your diabetes medicines.    Return to the emergency department if you develop new or worsening symptoms that concern you.

## 2020-08-17 NOTE — ED Provider Notes (Signed)
[facility] Emergency Department Provider Note  ____________________________________________   First MD Initiated Contact with Patient 08/17/20 [phone]     (approximate)  I have reviewed the triage vital signs and the nursing notes.   HISTORY  Chief Complaint Hypoglycemia    HPI [name] [name] is a 69 y.o. female with medical history as listed below and recent ongoing issues with persistent nausea.  She presents tonight by EMS for altered mental status.  She took her diabetes medicines tonight (glimepiride and Metformin) but did not have much to eat because of the ongoing issues with nausea.  Reportedly her family said that she is confused and not acting right.  EMS found that her blood sugar was about 40 and after receiving  250 mL of D10 by EMS, her symptoms completely resolved.  She initially reportedly had some facial droop but that resolved after the dextrose administration.  The patient is oriented, calm, cooperative, and reports no symptoms other than some nausea which she has been having for an extended period of time.  The onset of the symptoms was acute and severe and the D10 made it better, nothing particular made it worse.  She has no numbness nor weakness in her extremities, no word finding difficulties, no lack of coordination.        Past Medical History:  Diagnosis Date  . Anemia   . Asthma   . Chronic airway obstruction ([place])   . Chronic kidney disease    F/U WITH DR LATEEF  . Diabetes mellitus without complication ([place])   . Dyspnea    WITH EXERTION  . GERD (gastroesophageal reflux disease)   . Heart murmur    ASYMPTOMATIC  . History of adenomatous polyp of colon   . History of bone density study   . Hyperlipidemia   . Hypertension   . Idiopathic peripheral neuropathy   . Morbid obesity ([place])   . Osteoporosis   . Plantar fascial fibromatosis   . Pulmonary fibrosis ([place])   . Sleep apnea    Hypersomnia with sleep apnea-DOES NOT USE  CPAP  . TIA (transient ischemic attack)     Patient Active Problem List   Diagnosis Date Noted  . 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 Normocytic anemia 04/04/2020  . Cellulitis 01/03/2020  . Cellulitis of right arm 01/02/2020  . Sepsis ([place]) 01/02/2020  . Asthma 01/02/2020  . HTN (hypertension) 01/02/2020  . Anemia due to stage 3b chronic kidney disease ([place]) 12/22/2019  . Pedal edema 12/01/2019  . Fall 11/18/2019  . Closed rib fracture 11/18/2019  . Pulmonary fibrosis ([place])   . Asthma exacerbation   . Acute renal failure superimposed on stage 3a chronic kidney disease ([place])   . Chronic pain syndrome 09/15/2018  . AVD (aortic valve disease) 10/07/2017  . Benign essential hypertension 06/17/2017  . Nonrheumatic aortic valve insufficiency 06/17/2017  . CKD (chronic kidney disease), stage IIIa 05/20/2017  . Wound dehiscence 11/05/2016  . Lumbar stenosis 10/15/2016  . Renal impairment 10/05/2016  . Right lumbar radiculopathy 10/05/2016  . Complete tear of right rotator cuff 11/28/2015  . Rotator cuff arthropathy, right 11/28/2015  . Low vitamin D level 02/28/2015  . Gastroesophageal reflux disease without esophagitis 12/14/2014  . Morbid obesity with BMI of 40.0-44.9, adult ([place]) 10/06/2014  . Aortic stenosis 09/14/2014  . DOE (dyspnea on exertion) 09/08/2014  . History of adenomatous polyp of colon 09/08/2014  . Type II diabetes mellitus with renal manifestations ([place])  03/22/2014  . Hyperlipidemia 03/22/2014    Past Surgical History:  Procedure Laterality Date  . ABDOMINAL HYSTERECTOMY    . BACK SURGERY    . BLADDER SUSPENSION    . CESAREAN SECTION    . COLONOSCOPY    . DILATION AND CURETTAGE OF UTERUS    . ESOPHAGOGASTRODUODENOSCOPY    . LUMBAR LAMINECTOMY/DECOMPRESSION MICRODISCECTOMY N/A 10/15/2016   Procedure: L3-L5 Laminectomy ;  Surgeon: Deetta Perla, MD;  Location: [facility] ORS;  Service: Neurosurgery;  Laterality: N/A;  . LUMBAR WOUND  DEBRIDEMENT N/A 11/05/2016   Procedure: LUMBAR WOUND DEBRIDEMENT;  Surgeon: Deetta Perla, MD;  Location: [facility] ORS;  Service: Neurosurgery;  Laterality: N/A;  . REVERSE SHOULDER ARTHROPLASTY Right 05/05/2020   Procedure: REVERSE SHOULDER ARTHROPLASTY;  Surgeon: Corky Mull, MD;  Location: [facility] ORS;  Service: Orthopedics;  Laterality: Right;  . TUBAL LIGATION      Prior to Admission medications   Medication Sig Start Date End Date Taking? Authorizing Provider  allopurinol (ZYLOPRIM) 100 MG tablet Take 100 mg by mouth every morning.     [provider]  aspirin EC 325 MG tablet Take 1 tablet (325 mg total) by mouth daily. 05/06/20   Lattie Corns, PA-C  clotrimazole-betamethasone (LOTRISONE) cream Apply 1 application topically 2 (two) times daily as needed (itching).  01/28/20   [provider]  cyclobenzaprine (FLEXERIL) 5 MG tablet Take 5 mg by mouth 2 (two) times daily as needed for muscle spasms.    [provider]  DULoxetine (CYMBALTA) 30 MG capsule Take 30 mg by mouth every morning.  07/17/19   [provider]  esomeprazole (NEXIUM) 40 MG capsule Take 40 mg by mouth every morning.     [provider]  ferrous sulfate 325 (65 FE) MG tablet Take 1 tablet (325 mg total) by mouth 2 (two) times daily with a meal. Patient taking differently: Take 325 mg by mouth daily with breakfast.  11/24/19   Nicole Kindred A, DO  glimepiride (AMARYL) 2 MG tablet Take 2 mg by mouth every morning. 07/23/19   [provider]  losartan (COZAAR) 50 MG tablet Take 50 mg by mouth every morning.     [provider]  metFORMIN (GLUCOPHAGE) 500 MG tablet Take 500 mg by mouth daily with breakfast.     [provider]  mometasone-formoterol (DULERA) 100-5 MCG/ACT AERO Inhale 2 puffs into the lungs daily as needed for wheezing or shortness of breath.  01/28/20 01/27/21  [provider]  ondansetron (ZOFRAN ODT) 4 MG disintegrating tablet Allow  1-2 tablets to dissolve in your mouth every 8 hours as needed for nausea/vomiting 08/17/20   Hinda Kehr, MD  ondansetron (ZOFRAN) 4 MG tablet Take 1 tablet (4 mg total) by mouth every 8 (eight) hours as needed for up to 10 doses for nausea or vomiting. 08/13/20   Lucrezia Starch, MD  oxyCODONE (OXY IR/ROXICODONE) 5 MG immediate release tablet Take 1-2 tablets (5-10 mg total) by mouth every 4 (four) hours as needed for moderate pain. 05/06/20   Lattie Corns, PA-C  simvastatin (ZOCOR) 20 MG tablet Take 20 mg by mouth every morning.     [provider]    Allergies Acetaminophen-codeine, Codeine, Furosemide, Penicillin v potassium, Quinine, Tramadol, and Tylenol [acetaminophen]  Family History  Problem Relation Age of Onset  . Stroke Mother   . Heart attack Mother   . Breast cancer Sister 56  . Stroke Sister   . Heart attack Sister   .  Breast cancer Cousin        maternal side 72's    Social History Social History   Tobacco Use  . Smoking status: Former Smoker    Packs/day: 3.00    Years: 30.00    Pack years: 90.00    Types: Cigarettes    Quit date: 04/28/2000    Years since quitting: 20.3  . Smokeless tobacco: Never Used  Vaping Use  . Vaping Use: Never used  Substance Use Topics  . Alcohol use: No  . Drug use: No    Review of Systems Constitutional: No fever/chills Eyes: No visual changes. ENT: No sore throat. Cardiovascular: Denies chest pain. Respiratory: Denies shortness of breath. Gastrointestinal: No abdominal pain.  Chronic nausea, occasional vomiting.  No diarrhea.  No constipation. Genitourinary: Negative for dysuria. Musculoskeletal: Negative for neck pain.  Negative for back pain. Integumentary: Negative for rash. Neurological: Transient altered mental status, improved with dextrose negative for headaches, focal weakness or numbness.   ____________________________________________   PHYSICAL EXAM:  VITAL SIGNS: ED Triage Vitals  [08/17/20 0045]  Enc Vitals Group     BP 122/61     Pulse Rate 99     Resp (!) 27     Temp 98 F (36.7 C)     Temp Source Oral     SpO2 100 %     Weight 78.5 kg (173 lb)     Height 1.499 m (4\' 11" )     Head Circumference      Peak Flow      Pain Score 0     Pain Loc      Pain Edu?      Excl. in [place]?     Constitutional: Alert and oriented.  Eyes: Conjunctivae are normal.  Head: Atraumatic. Nose: No congestion/rhinnorhea. Mouth/Throat: Patient is wearing a mask. Neck: No stridor.  No meningeal signs.   Cardiovascular: Normal rate, regular rhythm. Good peripheral circulation. Grossly normal heart sounds. Respiratory: Normal respiratory effort.  No retractions. Gastrointestinal: Soft and nontender. No distention.  Musculoskeletal: No lower extremity tenderness nor edema. No gross deformities of extremities. Neurologic:  Normal speech and language. No gross focal neurologic deficits are appreciated.  Skin:  Skin is warm, dry and intact.  Skin coloration appears consistent with vitiligo. Psychiatric: Mood and affect are normal. Speech and behavior are normal.  ____________________________________________   LABS (all labs ordered are listed, but only abnormal results are displayed)  Labs Reviewed  COMPREHENSIVE METABOLIC PANEL - Abnormal; Notable for the following components:      Result Value   CO2 20 (*)    Glucose, Bld 269 (*)    Creatinine, Ser 1.53 (*)    Albumin 3.4 (*)    GFR, Estimated 37 (*)    All other components within normal limits  CBC WITH DIFFERENTIAL/PLATELET - Abnormal; Notable for the following components:   Hemoglobin 10.9 (*)    HCT 34.4 (*)    RDW 16.4 (*)    Eosinophils Absolute 0.9 (*)    All other components within normal limits  CBG MONITORING, ED - Abnormal; Notable for the following components:   Glucose-Capillary 68 (*)    All other components within normal limits  CBG MONITORING, ED - Abnormal; Notable for the following components:    Glucose-Capillary 110 (*)    All other components within normal limits  CBG MONITORING, ED - Abnormal; Notable for the following components:   Glucose-Capillary 101 (*)    All other components within normal limits  URINALYSIS, ROUTINE W REFLEX MICROSCOPIC  CBG MONITORING, ED  CBG MONITORING, ED  CBG MONITORING, ED   ____________________________________________  EKG  ED ECG REPORT I, Hinda Kehr, the attending physician, personally viewed and interpreted this ECG.  Date: 08/17/2020 EKG Time: 00: 42 Rate: 100 Rhythm: Borderline sinus tachycardia QRS Axis: Right axis deviation Intervals: normal ST/T Wave abnormalities: Non-specific ST segment / T-wave changes, but no clear evidence of acute ischemia. Narrative Interpretation: no definitive evidence of acute ischemia; does not meet STEMI criteria.   ____________________________________________  RADIOLOGY I, Hinda Kehr, personally viewed and evaluated these images (plain radiographs) as part of my medical decision making, as well as reviewing the written report by the radiologist.  ED MD interpretation: No indication for emergent imaging  Official radiology report(s): No results found.  ____________________________________________   PROCEDURES   Procedure(s) performed (including Critical Care):  .1-3 Lead EKG Interpretation Performed by: Hinda Kehr, MD Authorized by: Hinda Kehr, MD     Interpretation: normal     ECG rate:  90   ECG rate assessment: normal     Rhythm: sinus rhythm     Ectopy: none     Conduction: normal       ____________________________________________   INITIAL IMPRESSION / MDM / ASSESSMENT AND PLAN / ED COURSE  As part of my medical decision making, I reviewed the following data within the [place] notes reviewed and incorporated, Labs reviewed , EKG interpreted , Old chart reviewed and Notes from prior ED visits   Differential diagnosis includes, but  is not limited to, accidental diabetes medication side effect due to decreased oral intake, other medication or drug side effect, CVA, electrolyte or metabolic abnormality.  The patient is on the cardiac monitor to evaluate for evidence of arrhythmia and/or significant heart rate changes.     Clinical Course as of Aug 17 606  Wed Aug 17, 2020  2355 Patient has been doing well over more than 5 hours observation.  She has tolerated some oral intake and her blood glucose is maintaining at an appropriate level.  Her last check was 101.  She is well-appearing and in no distress, watching TV, no focal neurological deficits, no alteration of awareness.  She is comfortable with the plan for discharge and outpatient follow-up.  She already has plans to see GI but I encouraged her to also follow-up with her primary care doctor to ask if modification to her diabetes regimen would be appropriate given her decreased oral intake.  I also encouraged her to drink plenty of fluids and to try nutritional supplements such as Ensure given that she is not able to eat as much as she has in the past.  She understands and agrees with the plan and I gave my usual and customary return precautions.   [CF]    Clinical Course User Index [CF] Hinda Kehr, MD     ____________________________________________  FINAL CLINICAL IMPRESSION(S) / ED DIAGNOSES  Final diagnoses:  Hypoglycemia  Dehydration  Chronic nausea     MEDICATIONS GIVEN DURING THIS VISIT:  Medications  ondansetron (ZOFRAN) injection 4 mg (4 mg Intravenous Given 08/17/20 0209)  sodium chloride 0.9 % bolus 1,000 mL (1,000 mLs Intravenous New Bag/Given 08/17/20 0226)     ED Discharge Orders         Ordered    ondansetron (ZOFRAN ODT) 4 MG disintegrating tablet        08/17/20 0606          *Please  note:  [name] [name] was evaluated in Emergency Department on 08/17/2020 for the symptoms described in the history of present illness. She was  evaluated in the context of the global COVID-19 pandemic, which necessitated consideration that the patient might be at risk for infection with the SARS-CoV-2 virus that causes COVID-19. Institutional protocols and algorithms that pertain to the evaluation of patients at risk for COVID-19 are in a state of rapid change based on information released by regulatory bodies including the CDC and federal and state organizations. These policies and algorithms were followed during the patient's care in the ED.  Some ED evaluations and interventions may be delayed as a result of limited staffing during and after the pandemic.*  Note:  This document was prepared using Dragon voice recognition software and may include unintentional dictation errors.   Hinda Kehr, MD 08/17/20 [phone]

## 2020-08-17 NOTE — ED Notes (Signed)
Pt completed drinking 8 oz of orange juice prior to second CBG check.

## 2020-08-26 ENCOUNTER — Other Ambulatory Visit
Admission: RE | Admit: 2020-08-26 | Discharge: 2020-08-26 | Disposition: A | Discharge status: Home / Self Care | Payer: Medicare Other | Source: Ambulatory Visit | Attending: Gastroenterology | Admitting: Gastroenterology

## 2020-08-26 ENCOUNTER — Other Ambulatory Visit: Payer: Self-pay

## 2020-08-26 DIAGNOSIS — Z20822 Contact with and (suspected) exposure to covid-19: Secondary | ICD-10-CM | POA: Diagnosis not present

## 2020-08-26 DIAGNOSIS — Z01812 Encounter for preprocedural laboratory examination: Secondary | ICD-10-CM | POA: Diagnosis present

## 2020-08-27 LAB — SARS CORONAVIRUS 2 (TAT 6-24 HRS): SARS Coronavirus 2: NEGATIVE

## 2020-08-29 ENCOUNTER — Encounter: Payer: Self-pay | Admitting: *Deleted

## 2020-08-30 ENCOUNTER — Ambulatory Visit: Payer: Medicare Other | Admitting: Certified Registered Nurse Anesthetist

## 2020-08-30 ENCOUNTER — Encounter: Payer: Self-pay | Admitting: *Deleted

## 2020-08-30 ENCOUNTER — Ambulatory Visit
Admission: RE | Admit: 2020-08-30 | Discharge: 2020-08-30 | Disposition: A | Discharge status: Home / Self Care | Payer: Medicare Other | Attending: Gastroenterology | Admitting: Gastroenterology

## 2020-08-30 ENCOUNTER — Encounter
Admission: RE | Disposition: A | Discharge status: Home / Self Care | Payer: Self-pay | Source: Home / Self Care | Attending: Gastroenterology

## 2020-08-30 DIAGNOSIS — D122 Benign neoplasm of ascending colon: Secondary | ICD-10-CM | POA: Insufficient documentation

## 2020-08-30 DIAGNOSIS — Z7951 Long term (current) use of inhaled steroids: Secondary | ICD-10-CM | POA: Diagnosis not present

## 2020-08-30 DIAGNOSIS — Z886 Allergy status to analgesic agent status: Secondary | ICD-10-CM | POA: Diagnosis not present

## 2020-08-30 DIAGNOSIS — Z7983 Long term (current) use of bisphosphonates: Secondary | ICD-10-CM | POA: Insufficient documentation

## 2020-08-30 DIAGNOSIS — K2289 Other specified disease of esophagus: Secondary | ICD-10-CM | POA: Insufficient documentation

## 2020-08-30 DIAGNOSIS — R634 Abnormal weight loss: Secondary | ICD-10-CM | POA: Insufficient documentation

## 2020-08-30 DIAGNOSIS — Z880 Allergy status to penicillin: Secondary | ICD-10-CM | POA: Diagnosis not present

## 2020-08-30 DIAGNOSIS — Z86010 Personal history of colonic polyps: Secondary | ICD-10-CM | POA: Insufficient documentation

## 2020-08-30 DIAGNOSIS — K449 Diaphragmatic hernia without obstruction or gangrene: Secondary | ICD-10-CM | POA: Diagnosis not present

## 2020-08-30 DIAGNOSIS — Z888 Allergy status to other drugs, medicaments and biological substances status: Secondary | ICD-10-CM | POA: Diagnosis not present

## 2020-08-30 DIAGNOSIS — Z885 Allergy status to narcotic agent status: Secondary | ICD-10-CM | POA: Insufficient documentation

## 2020-08-30 DIAGNOSIS — Z79899 Other long term (current) drug therapy: Secondary | ICD-10-CM | POA: Diagnosis not present

## 2020-08-30 DIAGNOSIS — R1310 Dysphagia, unspecified: Secondary | ICD-10-CM | POA: Insufficient documentation

## 2020-08-30 DIAGNOSIS — K2980 Duodenitis without bleeding: Secondary | ICD-10-CM | POA: Insufficient documentation

## 2020-08-30 DIAGNOSIS — Z7984 Long term (current) use of oral hypoglycemic drugs: Secondary | ICD-10-CM | POA: Insufficient documentation

## 2020-08-30 DIAGNOSIS — K2950 Unspecified chronic gastritis without bleeding: Secondary | ICD-10-CM | POA: Insufficient documentation

## 2020-08-30 DIAGNOSIS — K640 First degree hemorrhoids: Secondary | ICD-10-CM | POA: Insufficient documentation

## 2020-08-30 DIAGNOSIS — K5730 Diverticulosis of large intestine without perforation or abscess without bleeding: Secondary | ICD-10-CM | POA: Diagnosis not present

## 2020-08-30 DIAGNOSIS — Z8673 Personal history of transient ischemic attack (TIA), and cerebral infarction without residual deficits: Secondary | ICD-10-CM | POA: Diagnosis not present

## 2020-08-30 HISTORY — PX: COLONOSCOPY WITH PROPOFOL: SHX5780

## 2020-08-30 HISTORY — PX: ESOPHAGOGASTRODUODENOSCOPY (EGD) WITH PROPOFOL: SHX5813

## 2020-08-30 LAB — GLUCOSE, CAPILLARY: Glucose-Capillary: 90 mg/dL (ref 70–99)

## 2020-08-30 SURGERY — COLONOSCOPY WITH PROPOFOL
Anesthesia: General

## 2020-08-30 MED ORDER — PHENYLEPHRINE HCL (PRESSORS) 10 MG/ML IV SOLN
INTRAVENOUS | Status: DC | PRN
  Administered 2020-08-30: 50 ug via INTRAVENOUS
  Administered 2020-08-30: 100 ug via INTRAVENOUS
  Administered 2020-08-30: 150 ug via INTRAVENOUS
  Administered 2020-08-30 (×2): 100 ug via INTRAVENOUS
  Administered 2020-08-30: 150 ug via INTRAVENOUS
  Administered 2020-08-30: 100 ug via INTRAVENOUS

## 2020-08-30 MED ORDER — EPHEDRINE 5 MG/ML INJ
INTRAVENOUS | Status: AC
  Filled 2020-08-30: qty 10

## 2020-08-30 MED ORDER — PROPOFOL 500 MG/50ML IV EMUL
INTRAVENOUS | Status: DC | PRN
  Administered 2020-08-30: 140 ug/kg/min via INTRAVENOUS

## 2020-08-30 MED ORDER — FENTANYL CITRATE (PF) 100 MCG/2ML IJ SOLN
INTRAMUSCULAR | Status: AC
  Filled 2020-08-30: qty 2

## 2020-08-30 MED ORDER — IPRATROPIUM-ALBUTEROL 0.5-2.5 (3) MG/3ML IN SOLN
RESPIRATORY_TRACT | Status: AC
  Administered 2020-08-30: 3 mL via RESPIRATORY_TRACT
  Filled 2020-08-30: qty 3

## 2020-08-30 MED ORDER — LIDOCAINE HCL (CARDIAC) PF 100 MG/5ML IV SOSY
PREFILLED_SYRINGE | INTRAVENOUS | Status: DC | PRN
  Administered 2020-08-30: 80 mg via INTRAVENOUS

## 2020-08-30 MED ORDER — IPRATROPIUM-ALBUTEROL 0.5-2.5 (3) MG/3ML IN SOLN: 3.0000 mL | Freq: Once | RESPIRATORY_TRACT | Status: AC

## 2020-08-30 MED ORDER — FENTANYL CITRATE (PF) 100 MCG/2ML IJ SOLN
INTRAMUSCULAR | Status: DC | PRN
  Administered 2020-08-30 (×2): 25 ug via INTRAVENOUS

## 2020-08-30 MED ORDER — GLYCOPYRROLATE 0.2 MG/ML IJ SOLN
INTRAMUSCULAR | Status: DC | PRN
  Administered 2020-08-30 (×2): .1 mg via INTRAVENOUS

## 2020-08-30 MED ORDER — PROPOFOL 10 MG/ML IV BOLUS
INTRAVENOUS | Status: DC | PRN
  Administered 2020-08-30: 30 mg via INTRAVENOUS
  Administered 2020-08-30: 20 mg via INTRAVENOUS
  Administered 2020-08-30: 30 mg via INTRAVENOUS
  Administered 2020-08-30: 20 mg via INTRAVENOUS

## 2020-08-30 MED ORDER — EPHEDRINE SULFATE 50 MG/ML IJ SOLN
INTRAMUSCULAR | Status: DC | PRN
  Administered 2020-08-30: 10 mg via INTRAVENOUS

## 2020-08-30 MED ORDER — SODIUM CHLORIDE 0.9 % IV SOLN: INTRAVENOUS | Status: DC | PRN

## 2020-08-30 MED ORDER — SODIUM CHLORIDE 0.9 % IV SOLN: INTRAVENOUS | Status: DC

## 2020-08-30 NOTE — Anesthesia Procedure Notes (Signed)
Procedure Name: [name] [name] Performed by: Lily Peer, Kenita Bines, CRNA Pre-anesthesia Checklist: Patient identified, Emergency Drugs available, Suction available and Patient being monitored Patient Re-evaluated:Patient Re-evaluated prior to induction Oxygen Delivery Method: Simple face mask

## 2020-08-30 NOTE — Anesthesia Postprocedure Evaluation (Signed)
Anesthesia Post Note  Patient: TAHNI PORCHIA  Procedure(s) Performed: COLONOSCOPY WITH PROPOFOL (N/A ) ESOPHAGOGASTRODUODENOSCOPY (EGD) WITH PROPOFOL (N/A )  Patient location during evaluation: Endoscopy Anesthesia Type: General Level of consciousness: awake and alert and oriented Pain management: pain level controlled Vital Signs Assessment: post-procedure vital signs reviewed and stable Respiratory status: spontaneous breathing, nonlabored ventilation and respiratory function stable Cardiovascular status: blood pressure returned to baseline and stable Postop Assessment: no signs of nausea or vomiting Anesthetic complications: no   No complications documented.   Last Vitals:  Vitals:   08/30/20 1031 08/30/20 1041  BP: 118/73 128/87  Pulse:    Resp: (!) 23 (!) 21  Temp:    SpO2:      Last Pain:  Vitals:   08/30/20 1041  TempSrc:   PainSc: 0-No pain                 Frenchie Pribyl

## 2020-08-30 NOTE — Transfer of Care (Signed)
Immediate Anesthesia Transfer of Care Note  Patient: [name] [name]  Procedure(s) Performed: COLONOSCOPY WITH PROPOFOL (N/A ) ESOPHAGOGASTRODUODENOSCOPY (EGD) WITH PROPOFOL (N/A )  Patient Location: PACU and Endoscopy Unit  Anesthesia Type:General  Level of Consciousness: drowsy  Airway & Oxygen Therapy: Patient Spontanous Breathing and Patient connected to face mask oxygen  Post-op Assessment: Report given to RN and Post -op Vital signs reviewed and stable  Post vital signs: Reviewed and stable  Last Vitals:  Vitals Value Taken Time  BP 110/62   Temp    Pulse 94   Resp 18   SpO2 100     Last Pain:  Vitals:   08/30/20 1011  TempSrc:   PainSc: 0-No pain         Complications: No complications documented.

## 2020-08-30 NOTE — Anesthesia Preprocedure Evaluation (Signed)
Anesthesia Evaluation  Patient identified by MRN, date of birth, ID band Patient awake    Reviewed: Allergy & Precautions, NPO status , Patient's Chart, lab work & pertinent test results  History of Anesthesia Complications Negative for: history of anesthetic complications  Airway Mallampati: II  TM Distance: >3 FB Neck ROM: Full    Dental  (+) Poor Dentition, Missing, Upper Dentures, Loose   Pulmonary asthma , COPD,  COPD inhaler, former smoker,    breath sounds clear to auscultation- rhonchi (-) wheezing      Cardiovascular hypertension, (-) CAD, (-) Past MI, (-) Cardiac Stents and (-) CABG  Rhythm:Regular Rate:Normal - Systolic murmurs and - Diastolic murmurs    Neuro/Psych neg Seizures negative psych ROS   GI/Hepatic Neg liver ROS, GERD  ,  Endo/Other  diabetes, Oral Hypoglycemic Agents  Renal/GU CRFRenal disease     Musculoskeletal negative musculoskeletal ROS (+)   Abdominal (+) + obese,   Peds  Hematology  (+) anemia ,   Anesthesia Other Findings Past Medical History: No date: Anemia No date: Asthma No date: Chronic airway obstruction (HCC) No date: Chronic kidney disease     Comment:  F/U WITH DR LATEEF No date: Diabetes mellitus without complication (HCC) No date: Dyspnea     Comment:  WITH EXERTION No date: GERD (gastroesophageal reflux disease) No date: Heart murmur     Comment:  ASYMPTOMATIC No date: History of adenomatous polyp of colon No date: History of bone density study No date: Hyperlipidemia No date: Hypertension No date: Idiopathic peripheral neuropathy No date: Morbid obesity ([place]) No date: Osteoporosis 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 No date: Pulmonary fibrosis (HCC) No date: TIA (transient ischemic attack)   Reproductive/Obstetrics                             Anesthesia Physical Anesthesia Plan  ASA: III  Anesthesia Plan: General   Post-op  Pain Management:    Induction: Intravenous  PONV Risk Score and Plan: 2 and Propofol infusion  Airway Management Planned: Natural Airway  Additional Equipment:   Intra-op Plan:   Post-operative Plan:   Informed Consent: I have reviewed the patients History and Physical, chart, labs and discussed the procedure including the risks, benefits and alternatives for the proposed anesthesia with the patient or authorized representative who has indicated his/her understanding and acceptance.     Dental advisory given  Plan Discussed with: CRNA and Anesthesiologist  Anesthesia Plan Comments:         Anesthesia Quick Evaluation

## 2020-08-30 NOTE — Op Note (Addendum)
[facility] Gastroenterology Patient Name: [name] [name] Procedure Date: 08/30/2020 9:33 AM MRN: [id number] Account #: [id number] Date of Birth: 1951-05-08 Admit Type: Outpatient Age: 69 Room: [facility] ENDO ROOM 1 Gender: Female Note Status: Supervisor Override Procedure:             Colonoscopy Indications:           High risk colon cancer surveillance: Personal history                         of colonic polyps Providers:             Andrey Farmer MD, MD Referring MD:          Tracie Harrier, MD (Referring MD) Medicines:             Monitored Anesthesia Care Complications:         No immediate complications. Estimated blood loss:                         Minimal. Procedure:             Pre-Anesthesia Assessment:                        - Prior to the procedure, a History and Physical was                         performed, and patient medications and allergies were                         reviewed. The patient is competent. The risks and                         benefits of the procedure and the sedation options and                         risks were discussed with the patient. All questions                         were answered and informed consent was obtained.                         Patient identification and proposed procedure were                         verified by the physician, the nurse, the anesthetist                         and the technician in the endoscopy suite. Mental                         Status Examination: alert and oriented. Airway                         Examination: normal oropharyngeal airway and neck                         mobility. Respiratory Examination: clear to  auscultation. CV Examination: normal. Prophylactic                         Antibiotics: The patient does not require prophylactic                         antibiotics. Prior Anticoagulants: The patient has                         taken no previous  anticoagulant or antiplatelet                         agents. ASA Grade Assessment: III - A patient with                         severe systemic disease. After reviewing the risks and                         benefits, the patient was deemed in satisfactory                         condition to undergo the procedure. The anesthesia                         plan was to use monitored anesthesia care (MAC).                         Immediately prior to administration of medications,                         the patient was re-assessed for adequacy to receive                         sedatives. The heart rate, respiratory rate, oxygen                         saturations, blood pressure, adequacy of pulmonary                         ventilation, and response to care were monitored                         throughout the procedure. The physical status of the                         patient was re-assessed after the procedure.                        After obtaining informed consent, the colonoscope was                         passed under direct vision. Throughout the procedure,                         the patient's blood pressure, pulse, and oxygen                         saturations were monitored continuously. The  Colonoscope was introduced through the anus and                         advanced to the the cecum, identified by appendiceal                         orifice and ileocecal valve. The colonoscopy was                         performed without difficulty. The patient tolerated                         the procedure well. The quality of the bowel                         preparation was fair. Findings:      The perianal and digital rectal examinations were normal.      Semi-solid stool was found in the ascending colon, precluding       visualization.      A 2 mm polyp was found in the ascending colon. The polyp was sessile.       The polyp was removed with a jumbo cold  forceps. The polyp was removed       with a cold snare. Resection and retrieval were complete. Estimated       blood loss was minimal.      Many small and large-mouthed diverticula were found in the sigmoid       colon, descending colon, transverse colon and ascending colon.      Non-bleeding internal hemorrhoids were found during retroflexion. The       hemorrhoids were Grade I (internal hemorrhoids that do not prolapse).      The exam was otherwise without abnormality on direct and retroflexion       views. Multiple attempts made to intubate TI but was unsuccesful. Impression:            - Preparation of the colon was fair.                        - Stool in the ascending colon.                        - One 2 mm polyp in the ascending colon, removed with                         a cold snare and removed with a jumbo cold forceps.                         Resected and retrieved.                        - Diverticulosis in the sigmoid colon, in the                         descending colon, in the transverse colon and in the                         ascending colon.                        -  Non-bleeding internal hemorrhoids.                        - The examination was otherwise normal on direct and                         retroflexion views. Recommendation:        - Discharge patient to home.                        - Resume previous diet.                        - Continue present medications.                        - Await pathology results.                        - Repeat colonoscopy in 1 year because the bowel                         preparation was suboptimal.                        - Return to referring physician as previously                         scheduled. Continue work-up for nausea/vomiting/early                         satiety including possible gastroparesis evaluation.                         If unrevealing would consider CTE to evaluate for                         small bowel  pathology/strictures given difficulty                         intubating TI. Procedure Code(s):     --- Professional ---                        [phone], Colonoscopy, flexible; with removal of                         tumor(s), polyp(s), or other lesion(s) by snare                         technique Diagnosis Code(s):     --- Professional ---                        K64.0, First degree hemorrhoids                        K63.5, Polyp of colon                        R63.4, Abnormal weight loss                        K57.30, Diverticulosis of  large intestine without                         perforation or abscess without bleeding CPT copyright 2019 American [employer]. All rights reserved. The codes documented in this report are preliminary and upon coder review may  be revised to meet current compliance requirements. Andrey Farmer, MD Andrey Farmer MD, MD 08/30/2020 10:16:15 AM Number of Addenda: 0 Note Initiated On: 08/30/2020 9:33 AM Scope Withdrawal Time: 0 hours 8 minutes 12 seconds  Total Procedure Duration: 0 hours 15 minutes 2 seconds  Estimated Blood Loss:  Estimated blood loss was minimal.      [facility]

## 2020-08-30 NOTE — H&P (Signed)
Outpatient short stay form Pre-procedure 08/30/2020 9:26 AM Raylene Miyamoto MD, MPH  Primary Physician: Dr. Ginette Pitman  Reason for visit:  Weight loss/nausea/vomiting  History of present illness:   69 y/o lady with history of DM and adenomatous polyps here for EGD/Colonoscopy for weight loss/nausea/vomiting. No family history of GI malignancies. No blood thinners, history of hysterectomy. No smoking/alcohol.   No current facility-administered medications for this encounter.  Medications Prior to Admission  Medication Sig Dispense Refill Last Dose   albuterol (VENTOLIN HFA) 108 (90 Base) MCG/ACT inhaler Inhale 2 puffs into the lungs every 6 (six) hours as needed for wheezing or shortness of breath.   Past Week at Unknown time   alendronate (FOSAMAX) 70 MG tablet Take 70 mg by mouth once a week. Take with a full glass of water on an empty stomach.   Past Week at Unknown time   allopurinol (ZYLOPRIM) 100 MG tablet Take 100 mg by mouth every morning.    Past Week at Unknown time   clotrimazole-betamethasone (LOTRISONE) cream Apply 1 application topically 2 (two) times daily as needed (itching).    Past Week at Unknown time   cyclobenzaprine (FLEXERIL) 5 MG tablet Take 5 mg by mouth 2 (two) times daily as needed for muscle spasms.   Past Week at Unknown time   DULoxetine (CYMBALTA) 30 MG capsule Take 30 mg by mouth every morning.    Past Week at Unknown time   esomeprazole (NEXIUM) 40 MG capsule Take 40 mg by mouth every morning.    Past Week at Unknown time   ferrous sulfate 325 (65 FE) MG tablet Take 1 tablet (325 mg total) by mouth 2 (two) times daily with a meal. (Patient taking differently: Take 325 mg by mouth daily with breakfast. ) 60 tablet 1 Past Week at Unknown time   glimepiride (AMARYL) 2 MG tablet Take 2 mg by mouth every morning.   Past Week at Unknown time   hydrochlorothiazide (MICROZIDE) 12.5 MG capsule Take 12.5 mg by mouth daily.   Past Week at Unknown time   losartan  (COZAAR) 50 MG tablet Take 50 mg by mouth every morning.    Past Week at Unknown time   metFORMIN (GLUCOPHAGE) 500 MG tablet Take 500 mg by mouth daily with breakfast.    Past Week at Unknown time   mometasone-formoterol (DULERA) 100-5 MCG/ACT AERO Inhale 2 puffs into the lungs daily as needed for wheezing or shortness of breath.    Past Week at Unknown time   ondansetron (ZOFRAN ODT) 4 MG disintegrating tablet Allow 1-2 tablets to dissolve in your mouth every 8 hours as needed for nausea/vomiting 30 tablet 2 Past Week at Unknown time   ondansetron (ZOFRAN) 4 MG tablet Take 1 tablet (4 mg total) by mouth every 8 (eight) hours as needed for up to 10 doses for nausea or vomiting. 10 tablet 0 Past Week at Unknown time   oxyCODONE (OXY IR/ROXICODONE) 5 MG immediate release tablet Take 1-2 tablets (5-10 mg total) by mouth every 4 (four) hours as needed for moderate pain. 60 tablet 0 Past Week at Unknown time   simvastatin (ZOCOR) 20 MG tablet Take 20 mg by mouth every morning.    Past Week at Unknown time   aspirin EC 325 MG tablet Take 1 tablet (325 mg total) by mouth daily. (Patient not taking: Reported on 08/30/2020) 30 tablet 0 Not Taking at Unknown time     Allergies  Allergen Reactions   Acetaminophen-Codeine Other (See Comments)  Other reaction(s): Unknown    Codeine Nausea And Vomiting   Furosemide Other (See Comments)   Penicillin V Potassium     Other reaction(s): Unknown   Quinine     Other reaction(s): Unknown   Tramadol     Other reaction(s): Unknown   Tylenol [Acetaminophen]     Tylenol-Codeine: Dizziness     Past Medical History:  Diagnosis Date   Anemia    Asthma    Chronic airway obstruction (HCC)    Chronic kidney disease    F/U WITH DR LATEEF   Diabetes mellitus without complication (HCC)    Dyspnea    WITH EXERTION   GERD (gastroesophageal reflux disease)    Heart murmur    ASYMPTOMATIC   History of adenomatous polyp of colon     History of bone density study    Hyperlipidemia    Hypertension    Idiopathic peripheral neuropathy    Morbid obesity (HCC)    Osteoporosis    Plantar fascial fibromatosis    Pulmonary fibrosis (HCC)    TIA (transient ischemic attack)     Review of systems:  Otherwise negative.    Physical Exam  Gen: Alert, oriented. Appears stated age.  HEENT: PERRLA. Lungs: No respiratory distress CV: RRR Abd: soft, benign, no masses. Ext: No edema.     Planned procedures: Proceed with EGD/colonoscopy. The patient understands the nature of the planned procedure, indications, risks, alternatives and potential complications including but not limited to bleeding, infection, perforation, damage to internal organs and possible oversedation/side effects from anesthesia. The patient agrees and gives consent to proceed.  Please refer to procedure notes for findings, recommendations and patient disposition/instructions.     Raylene Miyamoto MD, MPH Gastroenterology 08/30/2020  9:26 AM

## 2020-08-30 NOTE — Op Note (Signed)
[facility] Gastroenterology Patient Name: [name] [name] Procedure Date: 08/30/2020 9:34 AM MRN: [id number] Account #: [id number] Date of Birth: [birth year] Admit Type: Outpatient Age: 69 Room: [facility] ENDO ROOM 1 Gender: Female Note Status: Finalized Procedure:             Upper GI endoscopy Indications:           Dyspepsia, Dysphagia, Nausea with vomiting, Weight loss Providers:             Andrey Farmer MD, MD Medicines:             Monitored Anesthesia Care Complications:         No immediate complications. Estimated blood loss:                         Minimal. Procedure:             Pre-Anesthesia Assessment:                        - Prior to the procedure, a History and Physical was                         performed, and patient medications and allergies were                         reviewed. The patient is competent. The risks and                         benefits of the procedure and the sedation options and                         risks were discussed with the patient. All questions                         were answered and informed consent was obtained.                         Patient identification and proposed procedure were                         verified by the physician, the nurse, the anesthetist                         and the technician in the endoscopy suite. Mental                         Status Examination: alert and oriented. Airway                         Examination: normal oropharyngeal airway and neck                         mobility. Respiratory Examination: clear to                         auscultation. CV Examination: normal. Prophylactic                         Antibiotics: The patient does not require prophylactic  antibiotics. Prior Anticoagulants: The patient has                         taken no previous anticoagulant or antiplatelet                         agents. ASA Grade Assessment: III - A patient with                          severe systemic disease. After reviewing the risks and                         benefits, the patient was deemed in satisfactory                         condition to undergo the procedure. The anesthesia                         plan was to use monitored anesthesia care (MAC).                         Immediately prior to administration of medications,                         the patient was re-assessed for adequacy to receive                         sedatives. The heart rate, respiratory rate, oxygen                         saturations, blood pressure, adequacy of pulmonary                         ventilation, and response to care were monitored                         throughout the procedure. The physical status of the                         patient was re-assessed after the procedure.                        After obtaining informed consent, the endoscope was                         passed under direct vision. Throughout the procedure,                         the patient's blood pressure, pulse, and oxygen                         saturations were monitored continuously. The Endoscope                         was introduced through the mouth, and advanced to the                         second part of duodenum. The upper GI endoscopy was  accomplished without difficulty. The patient tolerated                         the procedure well. Findings:      Localized mild mucosal variance characterized by nodularity was found in       the lower third of the esophagus. Biopsies were taken with a cold       forceps for histology. Estimated blood loss was minimal.      A small hiatal hernia was present.      The exam of the esophagus was otherwise normal.      Diffuse mild inflammation characterized by erythema was found in the       gastric antrum. Biopsies were taken with a cold forceps for histology.       Estimated blood loss was minimal.      Localized  mild inflammation characterized by erythema was found in the       duodenal bulb.      The exam of the duodenum was otherwise normal. Impression:            - Esophageal mucosal variant. Biopsied.                        - Small hiatal hernia.                        - Gastritis. Biopsied.                        - Duodenitis. Recommendation:        - Discharge patient to home.                        - Resume previous diet.                        - Continue present medications.                        - Await pathology results.                        - Return to referring physician as previously                         scheduled. Procedure Code(s):     --- Professional ---                        [phone], Esophagogastroduodenoscopy, flexible,                         transoral; with biopsy, single or multiple Diagnosis Code(s):     --- Professional ---                        K22.8, Other specified diseases of esophagus                        K44.9, Diaphragmatic hernia without obstruction or                         gangrene  K29.70, Gastritis, unspecified, without bleeding                        K29.80, Duodenitis without bleeding                        R10.13, Epigastric pain                        R13.10, Dysphagia, unspecified                        R11.2, Nausea with vomiting, unspecified                        R63.4, Abnormal weight loss CPT copyright 2019 American Medical Association. All rights reserved. The codes documented in this report are preliminary and upon coder review may  be revised to meet current compliance requirements. Andrey Farmer, MD Andrey Farmer MD, MD 08/30/2020 10:11:22 AM Number of Addenda: 0 Note Initiated On: 08/30/2020 9:34 AM Estimated Blood Loss:  Estimated blood loss was minimal.      [facility]

## 2020-08-30 NOTE — Interval H&P Note (Signed)
History and Physical Interval Note:  08/30/2020 9:31 AM  [name] [name]  has presented today for surgery, with the diagnosis of HX ADEN POLYPS.  The various methods of treatment have been discussed with the patient and family. After consideration of risks, benefits and other options for treatment, the patient has consented to  Procedure(s): COLONOSCOPY WITH PROPOFOL (N/A) ESOPHAGOGASTRODUODENOSCOPY (EGD) WITH PROPOFOL (N/A) as a surgical intervention.  The patient's history has been reviewed, patient examined, no change in status, stable for surgery.  I have reviewed the patient's chart and labs.  Questions were answered to the patient's satisfaction.     Lesly Rubenstein  Ok to proceed with EGD/Colonoscopy

## 2020-09-02 LAB — SURGICAL PATHOLOGY

## 2020-09-05 ENCOUNTER — Other Ambulatory Visit: Payer: Self-pay

## 2020-09-05 ENCOUNTER — Emergency Department
Admission: EM | Admit: 2020-09-05 | Discharge: 2020-09-05 | Disposition: A | Discharge status: Home / Self Care | Payer: Medicare Other | Attending: Emergency Medicine | Admitting: Emergency Medicine

## 2020-09-05 ENCOUNTER — Encounter: Payer: Self-pay | Admitting: Gastroenterology

## 2020-09-05 DIAGNOSIS — Z5321 Procedure and treatment not carried out due to patient leaving prior to being seen by health care provider: Secondary | ICD-10-CM | POA: Diagnosis not present

## 2020-09-05 DIAGNOSIS — R112 Nausea with vomiting, unspecified: Secondary | ICD-10-CM | POA: Insufficient documentation

## 2020-09-05 LAB — COMPREHENSIVE METABOLIC PANEL
ALT: 10 U/L (ref 0–44)
AST: 26 U/L (ref 15–41)
Albumin: 3.8 g/dL (ref 3.5–5.0)
Alkaline Phosphatase: 89 U/L (ref 38–126)
Anion gap: 11 (ref 5–15)
BUN: 12 mg/dL (ref 8–23)
CO2: 24 mmol/L (ref 22–32)
Calcium: 8.8 mg/dL — ABNORMAL LOW (ref 8.9–10.3)
Chloride: 104 mmol/L (ref 98–111)
Creatinine, Ser: 1.1 mg/dL — ABNORMAL HIGH (ref 0.44–1.00)
GFR, Estimated: 55 mL/min — ABNORMAL LOW (ref >60)
Glucose, Bld: 125 mg/dL — ABNORMAL HIGH (ref 70–99)
Potassium: 3.9 mmol/L (ref 3.5–5.1)
Sodium: 139 mmol/L (ref 135–145)
Total Bilirubin: 0.6 mg/dL (ref 0.3–1.2)
Total Protein: 7.1 g/dL (ref 6.5–8.1)

## 2020-09-05 LAB — CBC
HCT: 37.5 % (ref 36.0–46.0)
Hemoglobin: 11.9 g/dL — ABNORMAL LOW (ref 12.0–15.0)
MCH: 27.8 pg (ref 26.0–34.0)
MCHC: 31.7 g/dL (ref 30.0–36.0)
MCV: 87.6 fL (ref 80.0–100.0)
Platelets: 344 10*3/uL (ref 150–400)
RBC: 4.28 MIL/uL (ref 3.87–5.11)
RDW: 17.6 % — ABNORMAL HIGH (ref 11.5–15.5)
WBC: 8.1 10*3/uL (ref 4.0–10.5)
nRBC: 0 % (ref 0.0–0.2)

## 2020-09-05 LAB — LIPASE, BLOOD: Lipase: 26 U/L (ref 11–51)

## 2020-09-05 NOTE — ED Triage Notes (Signed)
Patient c/o nausea/vomiting after eating for months. Has been seen by GI with unremarkable results. Given ODT zofran for at home. Denies pain. Patient stated she wanted her gallbladder evaluated

## 2020-09-09 ENCOUNTER — Encounter: Payer: Self-pay | Admitting: Medical Oncology

## 2020-09-09 ENCOUNTER — Other Ambulatory Visit: Payer: Self-pay

## 2020-09-09 ENCOUNTER — Emergency Department
Admission: EM | Admit: 2020-09-09 | Discharge: 2020-09-09 | Disposition: A | Discharge status: Home / Self Care | Payer: Medicare Other | Attending: Emergency Medicine | Admitting: Emergency Medicine

## 2020-09-09 DIAGNOSIS — N1832 Chronic kidney disease, stage 3b: Secondary | ICD-10-CM | POA: Insufficient documentation

## 2020-09-09 DIAGNOSIS — E162 Hypoglycemia, unspecified: Secondary | ICD-10-CM | POA: Diagnosis present

## 2020-09-09 DIAGNOSIS — J45901 Unspecified asthma with (acute) exacerbation: Secondary | ICD-10-CM | POA: Diagnosis not present

## 2020-09-09 DIAGNOSIS — Z7982 Long term (current) use of aspirin: Secondary | ICD-10-CM | POA: Diagnosis not present

## 2020-09-09 DIAGNOSIS — Z7984 Long term (current) use of oral hypoglycemic drugs: Secondary | ICD-10-CM | POA: Insufficient documentation

## 2020-09-09 DIAGNOSIS — E11649 Type 2 diabetes mellitus with hypoglycemia without coma: Secondary | ICD-10-CM | POA: Diagnosis not present

## 2020-09-09 DIAGNOSIS — Z87891 Personal history of nicotine dependence: Secondary | ICD-10-CM | POA: Insufficient documentation

## 2020-09-09 DIAGNOSIS — I129 Hypertensive chronic kidney disease with stage 1 through stage 4 chronic kidney disease, or unspecified chronic kidney disease: Secondary | ICD-10-CM | POA: Diagnosis not present

## 2020-09-09 DIAGNOSIS — Z79899 Other long term (current) drug therapy: Secondary | ICD-10-CM | POA: Diagnosis not present

## 2020-09-09 DIAGNOSIS — Z96611 Presence of right artificial shoulder joint: Secondary | ICD-10-CM | POA: Diagnosis not present

## 2020-09-09 DIAGNOSIS — E1122 Type 2 diabetes mellitus with diabetic chronic kidney disease: Secondary | ICD-10-CM | POA: Insufficient documentation

## 2020-09-09 LAB — BASIC METABOLIC PANEL
Anion gap: 14 (ref 5–15)
BUN: 13 mg/dL (ref 8–23)
CO2: 18 mmol/L — ABNORMAL LOW (ref 22–32)
Calcium: 8.5 mg/dL — ABNORMAL LOW (ref 8.9–10.3)
Chloride: 108 mmol/L (ref 98–111)
Creatinine, Ser: 0.91 mg/dL (ref 0.44–1.00)
GFR, Estimated: >60 mL/min (ref >60)
Glucose, Bld: 96 mg/dL (ref 70–99)
Potassium: 4.1 mmol/L (ref 3.5–5.1)
Sodium: 140 mmol/L (ref 135–145)

## 2020-09-09 LAB — CBG MONITORING, ED
Glucose-Capillary: 82 mg/dL (ref 70–99)
Glucose-Capillary: 60 mg/dL — ABNORMAL LOW (ref 70–99)
Glucose-Capillary: 60 mg/dL — ABNORMAL LOW (ref 70–99)
Glucose-Capillary: 150 mg/dL — ABNORMAL HIGH (ref 70–99)
Glucose-Capillary: 128 mg/dL — ABNORMAL HIGH (ref 70–99)
Glucose-Capillary: 87 mg/dL (ref 70–99)
Glucose-Capillary: 87 mg/dL (ref 70–99)

## 2020-09-09 LAB — CBC
HCT: 35.6 % — ABNORMAL LOW (ref 36.0–46.0)
Hemoglobin: 11.1 g/dL — ABNORMAL LOW (ref 12.0–15.0)
MCH: 27.7 pg (ref 26.0–34.0)
MCHC: 31.2 g/dL (ref 30.0–36.0)
MCV: 88.8 fL (ref 80.0–100.0)
Platelets: 279 10*3/uL (ref 150–400)
RBC: 4.01 MIL/uL (ref 3.87–5.11)
RDW: 17.3 % — ABNORMAL HIGH (ref 11.5–15.5)
WBC: 8.5 10*3/uL (ref 4.0–10.5)
nRBC: 0 % (ref 0.0–0.2)

## 2020-09-09 MED ORDER — DEXTROSE 50 % IV SOLN
INTRAVENOUS | Status: AC
  Filled 2020-09-09: qty 50

## 2020-09-09 NOTE — Progress Notes (Signed)
Inpatient Diabetes Program Recommendations  AACE/ADA: New Consensus Statement on Inpatient Glycemic Control  Target Ranges:  Prepandial:   less than 140 mg/dL      Peak postprandial:   less than 180 mg/dL (1-2 hours)      Critically ill patients:  140 - 180 mg/dL   Results for [name] [name], [name] [name] (MRN [id number]) as of 09/09/2020 13:42  Ref. Range 09/09/2020 10:22 09/09/2020 12:12  Glucose-Capillary Latest Ref Range: 70 - 99 mg/dL 82 60 (L)  Results for [name] [name], [name] [name] (MRN [id number]) as of 09/09/2020 13:42  Ref. Range 08/13/2020 10:35  Hemoglobin A1C Latest Ref Range: 4.8 - 5.6 % 5.7 (H)   Review of Glycemic Control  Diabetes history: DM2 Outpatient Diabetes medications: Metformin 500 mg QAM, Amaryl 2 mg QAM Current orders for Inpatient glycemic control: None; CBG monitoring  Inpatient Diabetes Program Recommendations:    Outpatient DM medications: Due to recent episodes of hypoglycemia, A1C of 5.7% (average glucose of 117 mg/dl), and increased risk of hypoglycemia with sulfonylureas, would discontinue Amaryl outpatient. Would continue Metformin and have patient follow up with PCP regarding glycemic control.   NOTE: Per chart, patient had office visit with Dr. Ginette Pitman on 08/23/20 for DM management and it was noted that patient was experiencing hypoglycemia, A1C was 5.8%, lost approximately 10 lbs since last weight, and off Prednisone. Per note, patient was advised to decrease Amaryl to 1 mg daily. Patient currently in the Emergency Department with hypoglycemia. Would recommend discontinuing Amaryl outpatient due to risk of hypoglycemia. Would continue Metformin due to low risk of hypoglycemia and have patient follow up with PCP regarding glycemic control.  Thanks, Barnie Alderman, RN, MSN, CDE Diabetes Coordinator Inpatient Diabetes Program [phone] (Team Pager from 8am to 5pm)

## 2020-09-09 NOTE — ED Triage Notes (Signed)
Pt from home via ems- reports that she had low blood glucose at home. EMS arrived and glucose was 42. Pt reports since her stroke she has not been eating as well as usually and has still been taking her same dose of metformin that has been causing her sugar to drop. Pt had appt with PCP scheduled today to do some med adjustments. Pt arrives A/o x 4. No complaints.

## 2020-09-09 NOTE — ED Notes (Signed)
Provider notified. Gave pt 2 orange juices and applesauce.

## 2020-09-09 NOTE — Discharge Instructions (Addendum)
Please hold your diabetes medications (glimepride, metformin) until you discuss with Dr. Ginette Pitman.

## 2020-09-09 NOTE — ED Provider Notes (Signed)
[facility] Emergency Department Provider Note   ____________________________________________    I have reviewed the triage vital signs and the nursing notes.   HISTORY  Chief Complaint Hypoglycemia     HPI [name] [name] is [name] 69 y.o. female with history of diabetes who reports decreased p.o. intake for the last several months, recently restarted taking her Metformin again and felt lightheaded and woozy today.  EMS was called on found her glucose to be 40, she was given D10 with rapid improvement.  Patient reports she feels fine now and is ready to go home.  Denies fevers chills cough.  No dysuria.  No nausea or vomiting.  Reportedly had PCP appointment to discuss these issues today Past Medical History:  Diagnosis Date  . Anemia   . Asthma   . Chronic airway obstruction ([place])   . Chronic kidney disease    F/U WITH DR LATEEF  . Diabetes mellitus without complication ([place])   . Dyspnea    WITH EXERTION  . GERD (gastroesophageal reflux disease)   . Heart murmur    ASYMPTOMATIC  . History of adenomatous polyp of colon   . History of bone density study   . Hyperlipidemia   . Hypertension   . Idiopathic peripheral neuropathy   . Morbid obesity ([name])   . Osteoporosis   . Plantar fascial fibromatosis   . Pulmonary fibrosis ([place])   . TIA (transient ischemic attack)     Patient Active Problem List   Diagnosis Date Noted  . 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 Normocytic anemia 04/04/2020  . Cellulitis 01/03/2020  . Cellulitis of right arm 01/02/2020  . Sepsis ([place]) 01/02/2020  . Asthma 01/02/2020  . HTN (hypertension) 01/02/2020  . Anemia due to stage 3b chronic kidney disease ([place]) 12/22/2019  . Pedal edema 12/01/2019  . Fall 11/18/2019  . Closed rib fracture 11/18/2019  . Pulmonary fibrosis ([place])   . Asthma exacerbation   . Acute renal failure superimposed on stage 3a chronic kidney disease ([place])   .  Chronic pain syndrome 09/15/2018  . AVD (aortic valve disease) 10/07/2017  . Benign essential hypertension 06/17/2017  . Nonrheumatic aortic valve insufficiency 06/17/2017  . CKD (chronic kidney disease), stage IIIa 05/20/2017  . Wound dehiscence 11/05/2016  . Lumbar stenosis 10/15/2016  . Renal impairment 10/05/2016  . Right lumbar radiculopathy 10/05/2016  . Complete tear of right rotator cuff 11/28/2015  . Rotator cuff arthropathy, right 11/28/2015  . Low vitamin D level 02/28/2015  . Gastroesophageal reflux disease without esophagitis 12/14/2014  . Morbid obesity with BMI of 40.0-44.9, adult ([place]) 10/06/2014  . Aortic stenosis 09/14/2014  . DOE (dyspnea on exertion) 09/08/2014  . History of adenomatous polyp of colon 09/08/2014  . Type II diabetes mellitus with renal manifestations ([place]) 03/22/2014  . Hyperlipidemia 03/22/2014    Past Surgical History:  Procedure Laterality Date  . ABDOMINAL HYSTERECTOMY    . BACK SURGERY    . BLADDER SUSPENSION    . CESAREAN SECTION    . COLONOSCOPY    . COLONOSCOPY WITH PROPOFOL N/[name] 08/30/2020   Procedure: COLONOSCOPY WITH PROPOFOL;  Surgeon: Lesly Rubenstein, MD;  Location: [facility] ENDOSCOPY;  Service: Endoscopy;  Laterality: N/[name];  . DILATION AND CURETTAGE OF UTERUS    . ESOPHAGOGASTRODUODENOSCOPY    . ESOPHAGOGASTRODUODENOSCOPY (EGD) WITH PROPOFOL N/[name] 08/30/2020   Procedure: ESOPHAGOGASTRODUODENOSCOPY (EGD) WITH PROPOFOL;  Surgeon: Lesly Rubenstein, MD;  Location: [facility] ENDOSCOPY;  Service: Endoscopy;  Laterality: N/[name];  . LUMBAR LAMINECTOMY/DECOMPRESSION MICRODISCECTOMY N/[name] 10/15/2016   Procedure: L3-L5 Laminectomy ;  Surgeon: Deetta Perla, MD;  Location: [facility] ORS;  Service: Neurosurgery;  Laterality: N/[name];  . LUMBAR WOUND DEBRIDEMENT N/[name] 11/05/2016   Procedure: LUMBAR WOUND DEBRIDEMENT;  Surgeon: Deetta Perla, MD;  Location: [facility] ORS;  Service: Neurosurgery;  Laterality: N/[name];  . REVERSE SHOULDER ARTHROPLASTY Right 05/05/2020   Procedure:  REVERSE SHOULDER ARTHROPLASTY;  Surgeon: Corky Mull, MD;  Location: [facility] ORS;  Service: Orthopedics;  Laterality: Right;  . TUBAL LIGATION      Prior to Admission medications   Medication Sig Start Date End Date Taking? Authorizing Provider  albuterol (VENTOLIN HFA) 108 (90 Base) MCG/ACT inhaler Inhale 2 puffs into the lungs every 6 (six) hours as needed for wheezing or shortness of breath.    [provider]  alendronate (FOSAMAX) 70 MG tablet Take 70 mg by mouth once [name] week. Take with [name] full glass of water on an empty stomach.    [provider]  allopurinol (ZYLOPRIM) 100 MG tablet Take 100 mg by mouth every morning.     [provider]  aspirin EC 325 MG tablet Take 1 tablet (325 mg total) by mouth daily. Patient not taking: Reported on 08/30/2020 05/06/20   [name] Corns, PA-C  clotrimazole-betamethasone (LOTRISONE) cream Apply 1 application topically 2 (two) times daily as needed (itching).  01/28/20   [provider]  cyclobenzaprine (FLEXERIL) 5 MG tablet Take 5 mg by mouth 2 (two) times daily as needed for muscle spasms.    [provider]  DULoxetine (CYMBALTA) 30 MG capsule Take 30 mg by mouth every morning.  07/17/19   [provider]  esomeprazole (NEXIUM) 40 MG capsule Take 40 mg by mouth every morning.     [provider]  ferrous sulfate 325 (65 FE) MG tablet Take 1 tablet (325 mg total) by mouth 2 (two) times daily with [name] meal. Patient taking differently: Take 325 mg by mouth daily with breakfast.  11/24/19   [name] Kindred A, DO  glimepiride (AMARYL) 2 MG tablet Take 2 mg by mouth every morning. 07/23/19   [provider]  hydrochlorothiazide (MICROZIDE) 12.5 MG capsule Take 12.5 mg by mouth daily.    [provider]  losartan (COZAAR) 50 MG tablet Take 50 mg by mouth every morning.     [provider]  metFORMIN (GLUCOPHAGE) 500 MG tablet Take 500 mg by mouth daily with breakfast.      [provider]  mometasone-formoterol (DULERA) 100-5 MCG/ACT AERO Inhale 2 puffs into the lungs daily as needed for wheezing or shortness of breath.  01/28/20 01/27/21  [provider]  ondansetron (ZOFRAN ODT) 4 MG disintegrating tablet Allow 1-2 tablets to dissolve in your mouth every 8 hours as needed for nausea/vomiting 08/17/20   Hinda Kehr, MD  ondansetron (ZOFRAN) 4 MG tablet Take 1 tablet (4 mg total) by mouth every 8 (eight) hours as needed for up to 10 doses for nausea or vomiting. 08/13/20   Lucrezia Starch, MD  oxyCODONE (OXY IR/ROXICODONE) 5 MG immediate release tablet Take 1-2 tablets (5-10 mg total) by mouth every 4 (four) hours as needed for moderate pain. 05/06/20   [name] Corns, PA-C  simvastatin (ZOCOR) 20 MG tablet Take 20 mg by mouth every morning.     [provider]     Allergies Acetaminophen-codeine, Codeine, Furosemide, Penicillin v potassium, Quinine, Tramadol, and Tylenol [acetaminophen]  Family History  Problem  Relation Age of Onset  . Stroke Mother   . Heart attack Mother   . Breast cancer Sister 62  . Stroke Sister   . Heart attack Sister   . Breast cancer Cousin        maternal side 28's    Social History Social History   Tobacco Use  . Smoking status: Former Smoker    Packs/day: 3.00    Years: 30.00    Pack years: 90.00    Types: Cigarettes    Quit date: 04/28/2000    Years since quitting: 20.3  . Smokeless tobacco: Never Used  Vaping Use  . Vaping Use: Never used  Substance Use Topics  . Alcohol use: No  . Drug use: No    Review of Systems  Constitutional: No fever/chills Eyes: No visual changes.  ENT: No sore throat. Cardiovascular: Denies chest pain. Respiratory: Denies shortness of breath. Gastrointestinal: No abdominal pain.   Genitourinary: Negative for dysuria. Musculoskeletal: Negative for back pain. Skin: Negative for rash. Neurological: Negative for  headaches   ____________________________________________   PHYSICAL EXAM:  VITAL SIGNS: ED Triage Vitals  Enc Vitals Group     BP 09/09/20 1031 (!) 126/54     Pulse Rate 09/09/20 1031 90     Resp 09/09/20 1031 16     Temp 09/09/20 1031 98.2 F (36.8 C)     Temp Source 09/09/20 1031 Oral     SpO2 09/09/20 1031 96 %     Weight 09/09/20 1032 74 kg (163 lb 2.3 oz)     Height 09/09/20 1032 1.499 m (4\' 11" )     Head Circumference --      Peak Flow --      Pain Score 09/09/20 1032 0     Pain Loc --      Pain Edu? --      Excl. in [place]? --     Constitutional: Alert and oriented. No acute distress. Pleasant and interactive  Nose: No congestion/rhinnorhea. Mouth/Throat: Mucous membranes are moist.   Neck:  Painless ROM Cardiovascular: Normal rate, regular rhythm. Grossly normal heart sounds.  Good peripheral circulation. Respiratory: Normal respiratory effort.  No retractions. Lungs CTAB. Gastrointestinal: Soft and nontender. No distention.  No CVA tenderness.  Musculoskeletal: No lower extremity tenderness nor edema.  Warm and well perfused Neurologic:  Normal speech and language. No gross focal neurologic deficits are appreciated.  Skin:  Skin is warm, dry and intact. No rash noted. Psychiatric: Mood and affect are normal. Speech and behavior are normal.  ____________________________________________   LABS (all labs ordered are listed, but only abnormal results are displayed)  Labs Reviewed  BASIC METABOLIC PANEL - Abnormal; Notable for the following components:      Result Value   CO2 18 (*)    Calcium 8.5 (*)    All other components within normal limits  CBC - Abnormal; Notable for the following components:   Hemoglobin 11.1 (*)    HCT 35.6 (*)    RDW 17.3 (*)    All other components within normal limits  CBG MONITORING, ED - Abnormal; Notable for the following components:   Glucose-Capillary 60 (*)    All other components within normal limits  CBG MONITORING, ED  - Abnormal; Notable for the following components:   Glucose-Capillary 60 (*)    All other components within normal limits  CBG MONITORING, ED   ____________________________________________  EKG  None ____________________________________________  RADIOLOGY  None ____________________________________________   PROCEDURES  Procedure(s) performed:  No  Procedures   Critical Care performed: No ____________________________________________   INITIAL IMPRESSION / ASSESSMENT AND PLAN / ED COURSE  Pertinent labs & imaging results that were available during my care of the patient were reviewed by me and considered in my medical decision making (see chart for details).  Patient presents with reported hypoglycemia, after EMS treatment patient's glucose within normal range.  Given juice upon arrival with only mild improvement.  We will continue to monitor in the emergency department, pending labs  Lab work overall reassuring, normal white blood cell count, normal electrolytes overall.  Patient's glucose dropped to 60 again, asymptomatic, encouraged additional p.o. intake but patient reports she is not hungry.  Did give amp of D50  We will continue to monitor    ____________________________________________   FINAL CLINICAL IMPRESSION(S) / ED DIAGNOSES  Final diagnoses:  None        Note:  This document was prepared using Dragon voice recognition software and may include unintentional dictation errors.   Lavonia Drafts, MD 09/09/20 [phone]

## 2020-09-09 NOTE — ED Notes (Signed)
Pt drinking OJ and eating applesauce. No further needs expressed by pt.

## 2020-09-09 NOTE — ED Provider Notes (Signed)
Patient observed not having any additional hypoglycemic episodes.  She is tolerating p.o.  Will instruct her to hold her diabetic medications until she can discuss further with her PCP.  Patient agreeable to plan.   Merlyn Lot, MD 09/09/20 [phone]

## 2020-11-03 ENCOUNTER — Other Ambulatory Visit: Payer: Self-pay | Admitting: Internal Medicine

## 2020-11-03 DIAGNOSIS — Z1231 Encounter for screening mammogram for malignant neoplasm of breast: Secondary | ICD-10-CM

## 2020-11-22 ENCOUNTER — Other Ambulatory Visit: Payer: Self-pay | Admitting: Family Medicine

## 2020-11-22 DIAGNOSIS — M19012 Primary osteoarthritis, left shoulder: Secondary | ICD-10-CM

## 2020-11-22 DIAGNOSIS — G8929 Other chronic pain: Secondary | ICD-10-CM

## 2020-11-22 DIAGNOSIS — M12812 Other specific arthropathies, not elsewhere classified, left shoulder: Secondary | ICD-10-CM

## 2020-11-22 DIAGNOSIS — M75102 Unspecified rotator cuff tear or rupture of left shoulder, not specified as traumatic: Secondary | ICD-10-CM

## 2020-11-25 ENCOUNTER — Telehealth: Payer: Self-pay | Admitting: *Deleted

## 2020-11-25 NOTE — Telephone Encounter (Signed)
Contacted regarding lung screening referral. Patient reports recent weight loss and smoking cessation > 20 years ago. Discussed that while patient is not eligible for lung cancer screening she could likely benefit from imaging for her symptoms. Patient verbalizes understanding. [place], Dr. Linton Ham nurse notified of the above.

## 2020-11-28 ENCOUNTER — Other Ambulatory Visit: Payer: Self-pay | Admitting: Internal Medicine

## 2020-11-28 DIAGNOSIS — R634 Abnormal weight loss: Secondary | ICD-10-CM

## 2020-11-28 DIAGNOSIS — J4489 Other specified chronic obstructive pulmonary disease: Secondary | ICD-10-CM

## 2020-11-28 DIAGNOSIS — N289 Disorder of kidney and ureter, unspecified: Secondary | ICD-10-CM

## 2020-11-28 DIAGNOSIS — J449 Chronic obstructive pulmonary disease, unspecified: Secondary | ICD-10-CM

## 2020-11-28 DIAGNOSIS — Z87891 Personal history of nicotine dependence: Secondary | ICD-10-CM

## 2020-11-28 DIAGNOSIS — Z6828 Body mass index (BMI) 28.0-28.9, adult: Secondary | ICD-10-CM

## 2020-11-30 ENCOUNTER — Other Ambulatory Visit: Payer: Self-pay

## 2020-11-30 ENCOUNTER — Ambulatory Visit
Admission: RE | Admit: 2020-11-30 | Discharge: 2020-11-30 | Disposition: A | Discharge status: Home / Self Care | Payer: Medicare Other | Source: Ambulatory Visit | Attending: Family Medicine | Admitting: Family Medicine

## 2020-11-30 DIAGNOSIS — J449 Chronic obstructive pulmonary disease, unspecified: Secondary | ICD-10-CM | POA: Diagnosis present

## 2020-11-30 DIAGNOSIS — M75102 Unspecified rotator cuff tear or rupture of left shoulder, not specified as traumatic: Secondary | ICD-10-CM | POA: Diagnosis present

## 2020-11-30 DIAGNOSIS — R634 Abnormal weight loss: Secondary | ICD-10-CM | POA: Insufficient documentation

## 2020-11-30 DIAGNOSIS — Z87891 Personal history of nicotine dependence: Secondary | ICD-10-CM | POA: Diagnosis present

## 2020-11-30 DIAGNOSIS — G8929 Other chronic pain: Secondary | ICD-10-CM | POA: Insufficient documentation

## 2020-11-30 DIAGNOSIS — N289 Disorder of kidney and ureter, unspecified: Secondary | ICD-10-CM | POA: Insufficient documentation

## 2020-11-30 DIAGNOSIS — M25512 Pain in left shoulder: Secondary | ICD-10-CM | POA: Diagnosis present

## 2020-11-30 DIAGNOSIS — Z6828 Body mass index (BMI) 28.0-28.9, adult: Secondary | ICD-10-CM | POA: Insufficient documentation

## 2020-11-30 DIAGNOSIS — M19012 Primary osteoarthritis, left shoulder: Secondary | ICD-10-CM | POA: Insufficient documentation

## 2020-11-30 DIAGNOSIS — M12812 Other specific arthropathies, not elsewhere classified, left shoulder: Secondary | ICD-10-CM | POA: Insufficient documentation

## 2020-12-22 ENCOUNTER — Ambulatory Visit: Payer: Medicare Other | Admitting: Dermatology

## 2021-02-02 ENCOUNTER — Other Ambulatory Visit (HOSPITAL_COMMUNITY): Payer: Self-pay | Admitting: Gastroenterology

## 2021-02-02 ENCOUNTER — Other Ambulatory Visit: Payer: Self-pay | Admitting: Gastroenterology

## 2021-02-02 DIAGNOSIS — R634 Abnormal weight loss: Secondary | ICD-10-CM

## 2021-02-02 DIAGNOSIS — R6881 Early satiety: Secondary | ICD-10-CM

## 2021-02-08 ENCOUNTER — Other Ambulatory Visit: Payer: Self-pay | Admitting: Surgery

## 2021-02-14 ENCOUNTER — Encounter
Admission: RE | Admit: 2021-02-14 | Discharge: 2021-02-14 | Disposition: A | Discharge status: Home / Self Care | Payer: Medicare Other | Source: Ambulatory Visit | Attending: Surgery | Admitting: Surgery

## 2021-02-14 ENCOUNTER — Other Ambulatory Visit: Payer: Self-pay

## 2021-02-14 DIAGNOSIS — Z01818 Encounter for other preprocedural examination: Secondary | ICD-10-CM | POA: Insufficient documentation

## 2021-02-14 DIAGNOSIS — Z01810 Encounter for preprocedural cardiovascular examination: Secondary | ICD-10-CM

## 2021-02-14 LAB — CBC WITH DIFFERENTIAL/PLATELET
Abs Immature Granulocytes: 0.07 10*3/uL (ref 0.00–0.07)
Basophils Absolute: 0.1 10*3/uL (ref 0.0–0.1)
Basophils Relative: 1 %
Eosinophils Absolute: 0.3 10*3/uL (ref 0.0–0.5)
Eosinophils Relative: 3 %
HCT: 30.7 % — ABNORMAL LOW (ref 36.0–46.0)
Hemoglobin: 9.6 g/dL — ABNORMAL LOW (ref 12.0–15.0)
Immature Granulocytes: 1 %
Lymphocytes Relative: 17 %
Lymphs Abs: 2.1 10*3/uL (ref 0.7–4.0)
MCH: 26.5 pg (ref 26.0–34.0)
MCHC: 31.3 g/dL (ref 30.0–36.0)
MCV: 84.8 fL (ref 80.0–100.0)
Monocytes Absolute: 0.8 10*3/uL (ref 0.1–1.0)
Monocytes Relative: 7 %
Neutro Abs: 8.7 10*3/uL — ABNORMAL HIGH (ref 1.7–7.7)
Neutrophils Relative %: 71 %
Platelets: 467 10*3/uL — ABNORMAL HIGH (ref 150–400)
RBC: 3.62 MIL/uL — ABNORMAL LOW (ref 3.87–5.11)
RDW: 14.8 % (ref 11.5–15.5)
WBC: 12 10*3/uL — ABNORMAL HIGH (ref 4.0–10.5)
nRBC: 0 % (ref 0.0–0.2)

## 2021-02-14 LAB — COMPREHENSIVE METABOLIC PANEL
ALT: 9 U/L (ref 0–44)
AST: 17 U/L (ref 15–41)
Albumin: 3.5 g/dL (ref 3.5–5.0)
Alkaline Phosphatase: 72 U/L (ref 38–126)
Anion gap: 10 (ref 5–15)
BUN: 18 mg/dL (ref 8–23)
CO2: 25 mmol/L (ref 22–32)
Calcium: 8.9 mg/dL (ref 8.9–10.3)
Chloride: 104 mmol/L (ref 98–111)
Creatinine, Ser: 1.29 mg/dL — ABNORMAL HIGH (ref 0.44–1.00)
GFR, Estimated: 45 mL/min — ABNORMAL LOW (ref >60)
Glucose, Bld: 61 mg/dL — ABNORMAL LOW (ref 70–99)
Potassium: 3.7 mmol/L (ref 3.5–5.1)
Sodium: 139 mmol/L (ref 135–145)
Total Bilirubin: 0.5 mg/dL (ref 0.3–1.2)
Total Protein: 7 g/dL (ref 6.5–8.1)

## 2021-02-14 LAB — SURGICAL PCR SCREEN
MRSA, PCR: NEGATIVE
Staphylococcus aureus: NEGATIVE

## 2021-02-14 NOTE — Progress Notes (Signed)
  Perioperative Services Pre-Admission/Anesthesia Testing   Date: 02/14/21 Name: [name] [name] MRN:   [id number]  Re: Consideration of preoperative prophylactic antibiotic change   Request sent to: Poggi, Marshall Cork, MD (routed and/or faxed via [facility])  Planned Surgical Procedure(s):     Notes: 1. Patient has a documented allergy to PCN VK only  . The actual reaction to PCN is unknown  2. Received cephalosporin with no documented complications . CEPHALEXIN received on 07/31/2015 . CEFTRIAXONE received on 01/02/2020  3. Screened as appropriate for cephalosporin use during medication reconciliation . No immediate angioedema, dysphagia, SOB, anaphylaxis symptoms. . No severe rash involving mucous membranes or skin necrosis. . No hospital admissions related to side effects of PCN/cephalosporin use.  . No documented reaction to PCN or cephalosporin in the last 10 years.  Request:  As an evidence based approach to reducing the rate of incidence for post-operative SSI and the development of MDROs, could an agent with narrower coverage for preoperative prophylaxis in this patient's upcoming surgical course be considered?   1. Currently ordered preoperative prophylactic ABX: clindamycin.   2. Specifically requesting change to cephalosporin (CEFAZOLIN).   3. Please communicate decision with me and I will change the orders in Epic as per your direction.   Things to consider:  Many patients report that they were "allergic" to PCN earlier in life, however this does not translate into a true lifelong allergy. Patients can lose sensitivity to specific IgE antibodies over time if PCN is avoided (Kleris & Lugar, 2019).   Up to 10% of the adult population and 15% of hospitalized patients report an allergy to PCN, however clinical studies suggest that 90% of those reporting an allergy can tolerate PCN antibiotics (Kleris & Lugar, 2019).   Cross-sensitivity between PCN and cephalosporins has been  documented as being as high as 10%, however this estimation included data believed to have been collected in a setting where there was contamination. Newer data suggests that the prevalence of cross-sensitivity between PCN and cephalosporins is actually estimated to be closer to 1% (Hermanides et al., 2018).    Patients labeled as PCN allergic, whether they are truly allergic or not, have been found to have inferior outcomes in terms of rates of serious infection, and these patients tend to have longer hospital stays ([place], 2019).   Treatment related secondary infections, such as Clostridioides difficile, have been linked to the improper use of broad spectrum antibiotics in patients improperly labeled as PCN allergic (Kleris & Lugar, 2019).   Anaphylaxis from cephalosporins is rare and the evidence suggests that there is no increased risk of an anaphylactic type reaction when cephalosporins are used in a PCN allergic patient (Pichichero, 2006).  Citations: Hermanides J, Lemkes BA, Prins Pearla Dubonnet MW, Terreehorst I. Presumed ?-Lactam Allergy and Cross-reactivity in the Operating Theater: A Practical Approach. Anesthesiology. 2018 Aug;129(2):335-342. doi: 10.1097/ALN.0000000000002252. PMID: 74081448.  Kleris, [place]., & Lugar, P. L. (2019). Things We Do For No Reason: Failing to Question a Penicillin Allergy History. Journal of hospital medicine, 14(10), [phone]. Advance online publication. https://www.wallace-middleton.info/  Pichichero, M. E. (2006). Cephalosporins can be prescribed safely for penicillin-allergic patients. Journal of family medicine, 55(2), 106-112. Accessed: https://cdn.mdedge.com/files/s12fs-public/Document/September-2017/5502JFP_AppliedEvidence1.pdf   Honor Loh, MSN, APRN, FNP-C, CEN [facility]  Peri-operative Services Nurse Practitioner FAX: [phone] 02/14/21 1:17 PM

## 2021-02-14 NOTE — Patient Instructions (Signed)
Your procedure is scheduled on: Thursday 02/23/21.  Report to THE FIRST FLOOR REGISTRATION DESK IN THE MEDICAL MALL ON THE MORNING OF SURGERY FIRST, THEN YOU WILL CHECK IN AT THE SURGERY INFORMATION DESK LOCATED OUTSIDE THE SAME DAY SURGERY DEPARTMENT LOCATED ON 2ND FLOOR MEDICAL MALL ENTRANCE.   To find out your arrival time please call [phone] between 1PM - 3PM on Wednesday 02/22/21.   Remember: Instructions that are not followed completely may result in serious medical risk, up to and including death, or upon the discretion of your surgeon and anesthesiologist your surgery may need to be rescheduled.     __X__ 1. Do not eat food after midnight the night before your procedure.                 No gum chewing or hard candies. You may drink clear liquids up to 2 hours                 before you are scheduled to arrive for your surgery- DO NOT drink clear                 liquids within 2 hours of the start of your surgery.                 Clear Liquids include:  water, apple juice without pulp, clear carbohydrate                 drink such as Clearfast or Gatorade, Black Coffee or Tea (Do not add                 milk or creamer to coffee or tea).  ** Dr. Roland Rack would like for you to drink the Clear Pre-Surgery Ensure 2 hours prior to your arrival time on the morning of your surgery.   __X__2.  On the morning of surgery brush your teeth with toothpaste and water, you may rinse your mouth with mouthwash if you wish.  Do not swallow any toothpaste or mouthwash.    __X__ 3.  No Alcohol for 24 hours before or after surgery.  __X__ 4.  Do Not Smoke or use e-cigarettes For 24 Hours Prior to Your Surgery.                 Do not use any chewable tobacco products for at least 6 hours prior to                 surgery.  __X__5.  Notify your doctor if there is any change in your medical condition      (cold, fever, infections).      Do NOT wear jewelry, make-up, hairpins, clips or nail  polish. Do NOT wear lotions, powders, or perfumes.  Do NOT shave 48 hours prior to surgery. Men may shave face and neck. Do NOT bring valuables to the hospital.     [facility] is not responsible for any belongings or valuables.   Contacts, dentures/partials or body piercings may not be worn into surgery. Bring a case for your contacts, glasses or hearing aids, a denture cup will be supplied.  Leave your suitcase in the car. After surgery it may be brought to your room.   For patients admitted to the hospital, discharge time is determined by your treatment team.     __X__ Take these medicines the morning of surgery with A SIP OF WATER:     1. albuterol (VENTOLIN HFA) 108 (90 Base) MCG/ACT inhaler  __X__ Use CHG Soap as directed.  __X__ Use inhalers on the day of surgery. Also bring the inhaler with you to the hospital on the morning of surgery.   __X__ Stop Anti-inflammatories 7 days before surgery such as Advil, Ibuprofen, Motrin, BC or Goodies Powder, Naprosyn, Naproxen, Aleve, Aspirin, Meloxicam. May take Tylenol and oxyCODONE (OXY IR/ROXICODONE) if needed for pain or discomfort.   __X__Do not start taking any new herbal supplements or vitamins prior to your procedure.     Wear comfortable clothing (specific to your surgery type) to the hospital.  Plan for stool softeners for home use; pain medications have a tendency to cause constipation. You can also help prevent constipation by eating foods high in fiber such as fruits and vegetables and drinking plenty of fluids as your diet allows.  After surgery, you can prevent lung complications by doing breathing exercises.Take deep breaths and cough every 1-2 hours. Your doctor may order a device called an Incentive Spirometer to help you take deep breaths.  Please call the [place] Department at [phone] if you have any questions about these instructions.

## 2021-02-15 NOTE — Progress Notes (Signed)
  Perioperative Services Pre-Admission/Anesthesia Testing     Date: 02/15/21  Name: ETHELMAE RINGEL MRN:   [id number]  Re: Change in [place] for upcoming surgery   Case: [id number] Date/Time: 02/23/21 1242   Procedure: REVERSE SHOULDER ARTHROPLASTY (Left Shoulder)   Anesthesia type: Choice   Pre-op diagnosis:      Nontraumatic complete tear of left rotator cuff M75.122     Rotator cuff arthropathy, left M12.812   Location: [facility] OR ROOM 03 / [place] ORS FOR ANESTHESIA GROUP   Surgeons: Corky Mull, MD    Primary attending surgeon was consulted regarding consideration of therapeutic change in antimicrobial agent being used for preoperative prophylaxis in this patient's upcoming surgical case. Following analysis of the risk versus benefits, Dr. Roland Rack, Marshall Cork, MD advising that it would be acceptable to discontinue the ordered clindamycin and place an order for cefazolin 2 gm IV on call to the OR. Orders for this patient were amended by me following collaborative conversation with attending surgeon.  Honor Loh, MSN, APRN, FNP-C, CEN [facility]  Peri-operative Services Nurse Practitioner Phone: [phone] 02/15/21 11:18 AM

## 2021-02-21 ENCOUNTER — Other Ambulatory Visit: Payer: Self-pay

## 2021-02-21 ENCOUNTER — Other Ambulatory Visit
Admission: RE | Admit: 2021-02-21 | Discharge: 2021-02-21 | Disposition: A | Discharge status: Home / Self Care | Payer: Medicare Other | Source: Ambulatory Visit | Attending: Surgery | Admitting: Surgery

## 2021-02-21 DIAGNOSIS — Z20822 Contact with and (suspected) exposure to covid-19: Secondary | ICD-10-CM | POA: Insufficient documentation

## 2021-02-21 DIAGNOSIS — Z01812 Encounter for preprocedural laboratory examination: Secondary | ICD-10-CM | POA: Insufficient documentation

## 2021-02-21 LAB — SARS CORONAVIRUS 2 (TAT 6-24 HRS): SARS Coronavirus 2: NEGATIVE

## 2021-02-23 ENCOUNTER — Inpatient Hospital Stay
Admission: RE | Admit: 2021-02-23 | Discharge: 2021-02-25 | DRG: 483 | Disposition: A | Discharge status: Home Health | Payer: Medicare Other | Attending: Surgery | Admitting: Surgery

## 2021-02-23 ENCOUNTER — Encounter: Payer: Self-pay | Admitting: Surgery

## 2021-02-23 ENCOUNTER — Inpatient Hospital Stay: Payer: Medicare Other | Admitting: Urgent Care

## 2021-02-23 ENCOUNTER — Other Ambulatory Visit: Payer: Self-pay

## 2021-02-23 ENCOUNTER — Inpatient Hospital Stay: Payer: Medicare Other

## 2021-02-23 ENCOUNTER — Encounter
Admission: RE | Disposition: A | Discharge status: Home / Self Care | Payer: Self-pay | Source: Home / Self Care | Attending: Surgery

## 2021-02-23 DIAGNOSIS — Z96612 Presence of left artificial shoulder joint: Secondary | ICD-10-CM

## 2021-02-23 DIAGNOSIS — M75122 Complete rotator cuff tear or rupture of left shoulder, not specified as traumatic: Secondary | ICD-10-CM | POA: Diagnosis present

## 2021-02-23 DIAGNOSIS — Z8249 Family history of ischemic heart disease and other diseases of the circulatory system: Secondary | ICD-10-CM | POA: Diagnosis not present

## 2021-02-23 DIAGNOSIS — Z20822 Contact with and (suspected) exposure to covid-19: Secondary | ICD-10-CM | POA: Diagnosis present

## 2021-02-23 DIAGNOSIS — Z885 Allergy status to narcotic agent status: Secondary | ICD-10-CM

## 2021-02-23 DIAGNOSIS — J8410 Pulmonary fibrosis, unspecified: Secondary | ICD-10-CM | POA: Diagnosis present

## 2021-02-23 DIAGNOSIS — I10 Essential (primary) hypertension: Secondary | ICD-10-CM | POA: Diagnosis present

## 2021-02-23 DIAGNOSIS — M810 Age-related osteoporosis without current pathological fracture: Secondary | ICD-10-CM | POA: Diagnosis present

## 2021-02-23 DIAGNOSIS — Z9109 Other allergy status, other than to drugs and biological substances: Secondary | ICD-10-CM

## 2021-02-23 DIAGNOSIS — Z8673 Personal history of transient ischemic attack (TIA), and cerebral infarction without residual deficits: Secondary | ICD-10-CM

## 2021-02-23 DIAGNOSIS — Z803 Family history of malignant neoplasm of breast: Secondary | ICD-10-CM | POA: Diagnosis not present

## 2021-02-23 DIAGNOSIS — Z96611 Presence of right artificial shoulder joint: Secondary | ICD-10-CM | POA: Diagnosis present

## 2021-02-23 DIAGNOSIS — E785 Hyperlipidemia, unspecified: Secondary | ICD-10-CM | POA: Diagnosis present

## 2021-02-23 DIAGNOSIS — Z79899 Other long term (current) drug therapy: Secondary | ICD-10-CM | POA: Diagnosis not present

## 2021-02-23 DIAGNOSIS — J449 Chronic obstructive pulmonary disease, unspecified: Secondary | ICD-10-CM | POA: Diagnosis present

## 2021-02-23 DIAGNOSIS — Z87891 Personal history of nicotine dependence: Secondary | ICD-10-CM

## 2021-02-23 DIAGNOSIS — Z823 Family history of stroke: Secondary | ICD-10-CM

## 2021-02-23 HISTORY — PX: REVERSE SHOULDER ARTHROPLASTY: SHX5054

## 2021-02-23 SURGERY — ARTHROPLASTY, SHOULDER, TOTAL, REVERSE
Anesthesia: General | Site: Shoulder | Laterality: Left

## 2021-02-23 MED ORDER — SODIUM CHLORIDE 0.9 % IV SOLN
INTRAVENOUS | Status: DC | PRN
  Administered 2021-02-23: 60 mL

## 2021-02-23 MED ORDER — ORAL CARE MOUTH RINSE: 15.0000 mL | Freq: Once | OROMUCOSAL | Status: AC

## 2021-02-23 MED ORDER — FENTANYL CITRATE (PF) 100 MCG/2ML IJ SOLN
INTRAMUSCULAR | Status: AC
  Administered 2021-02-23: 25 ug via INTRAVENOUS
  Filled 2021-02-23: qty 2

## 2021-02-23 MED ORDER — SUGAMMADEX SODIUM 200 MG/2ML IV SOLN
INTRAVENOUS | Status: DC | PRN
  Administered 2021-02-23: 200 mg via INTRAVENOUS

## 2021-02-23 MED ORDER — METOCLOPRAMIDE HCL 5 MG/ML IJ SOLN: 5.0000 mg | Freq: Three times a day (TID) | INTRAMUSCULAR | Status: DC | PRN

## 2021-02-23 MED ORDER — ONDANSETRON HCL 4 MG/2ML IJ SOLN: 4.0000 mg | Freq: Four times a day (QID) | INTRAMUSCULAR | Status: DC | PRN

## 2021-02-23 MED ORDER — DOCUSATE SODIUM 100 MG PO CAPS
100.0000 mg | ORAL_CAPSULE | Freq: Two times a day (BID) | ORAL | Status: DC
  Administered 2021-02-23: 100 mg via ORAL
  Filled 2021-02-23 (×4): qty 1

## 2021-02-23 MED ORDER — FAMOTIDINE 20 MG PO TABS: 20.0000 mg | ORAL_TABLET | Freq: Once | ORAL | Status: AC

## 2021-02-23 MED ORDER — CEFAZOLIN SODIUM-DEXTROSE 2-4 GM/100ML-% IV SOLN
2.0000 g | Freq: Four times a day (QID) | INTRAVENOUS | Status: AC
  Administered 2021-02-23 – 2021-02-24 (×2): 2 g via INTRAVENOUS
  Filled 2021-02-23 (×5): qty 100

## 2021-02-23 MED ORDER — KETOROLAC TROMETHAMINE 15 MG/ML IJ SOLN
INTRAMUSCULAR | Status: AC
  Administered 2021-02-23: 15 mg via INTRAVENOUS
  Filled 2021-02-23: qty 1

## 2021-02-23 MED ORDER — LIDOCAINE HCL (CARDIAC) PF 100 MG/5ML IV SOSY
PREFILLED_SYRINGE | INTRAVENOUS | Status: DC | PRN
  Administered 2021-02-23: 60 mg via INTRAVENOUS

## 2021-02-23 MED ORDER — MAGNESIUM HYDROXIDE 400 MG/5ML PO SUSP: 30.0000 mL | Freq: Every day | ORAL | Status: DC | PRN

## 2021-02-23 MED ORDER — MIDAZOLAM HCL 2 MG/2ML IJ SOLN
INTRAMUSCULAR | Status: DC | PRN
  Administered 2021-02-23: 2 mg via INTRAVENOUS

## 2021-02-23 MED ORDER — TRANEXAMIC ACID 1000 MG/10ML IV SOLN
INTRAVENOUS | Status: AC
  Filled 2021-02-23: qty 10

## 2021-02-23 MED ORDER — FENTANYL CITRATE (PF) 100 MCG/2ML IJ SOLN
INTRAMUSCULAR | Status: DC | PRN
  Administered 2021-02-23 (×2): 50 ug via INTRAVENOUS

## 2021-02-23 MED ORDER — ACETAMINOPHEN 10 MG/ML IV SOLN
INTRAVENOUS | Status: DC | PRN
  Administered 2021-02-23: 1000 mg via INTRAVENOUS

## 2021-02-23 MED ORDER — ACETAMINOPHEN 500 MG PO TABS
1000.0000 mg | ORAL_TABLET | Freq: Four times a day (QID) | ORAL | Status: AC
  Administered 2021-02-23 – 2021-02-24 (×2): 1000 mg via ORAL
  Filled 2021-02-23 (×3): qty 2

## 2021-02-23 MED ORDER — DEXAMETHASONE SODIUM PHOSPHATE 10 MG/ML IJ SOLN
INTRAMUSCULAR | Status: DC | PRN
  Administered 2021-02-23: 10 mg via INTRAVENOUS

## 2021-02-23 MED ORDER — ASPIRIN EC 325 MG PO TBEC
325.0000 mg | DELAYED_RELEASE_TABLET | Freq: Every day | ORAL | Status: DC
  Filled 2021-02-23 (×2): qty 1

## 2021-02-23 MED ORDER — SODIUM CHLORIDE FLUSH 0.9 % IV SOLN
INTRAVENOUS | Status: AC
  Filled 2021-02-23: qty 40

## 2021-02-23 MED ORDER — CHLORHEXIDINE GLUCONATE 0.12 % MT SOLN
OROMUCOSAL | Status: AC
  Administered 2021-02-23: 15 mL via OROMUCOSAL
  Filled 2021-02-23: qty 15

## 2021-02-23 MED ORDER — SODIUM CHLORIDE 0.9 % IV SOLN
INTRAVENOUS | Status: DC | PRN
  Administered 2021-02-23: 50 ug/min via INTRAVENOUS

## 2021-02-23 MED ORDER — PROPOFOL 10 MG/ML IV BOLUS
INTRAVENOUS | Status: DC | PRN
  Administered 2021-02-23: 20 mg via INTRAVENOUS
  Administered 2021-02-23: 120 mg via INTRAVENOUS

## 2021-02-23 MED ORDER — ACETAMINOPHEN 10 MG/ML IV SOLN
INTRAVENOUS | Status: AC
  Filled 2021-02-23: qty 100

## 2021-02-23 MED ORDER — ENOXAPARIN SODIUM 40 MG/0.4ML IJ SOSY
40.0000 mg | PREFILLED_SYRINGE | INTRAMUSCULAR | Status: DC
  Administered 2021-02-24 – 2021-02-25 (×2): 40 mg via SUBCUTANEOUS
  Filled 2021-02-23 (×2): qty 0.4

## 2021-02-23 MED ORDER — LACTATED RINGERS IV SOLN: INTRAVENOUS | Status: DC

## 2021-02-23 MED ORDER — SODIUM CHLORIDE 0.9 % IV SOLN: INTRAVENOUS | Status: DC | PRN

## 2021-02-23 MED ORDER — METOCLOPRAMIDE HCL 10 MG PO TABS: 5.0000 mg | ORAL_TABLET | Freq: Three times a day (TID) | ORAL | Status: DC | PRN

## 2021-02-23 MED ORDER — FENTANYL CITRATE (PF) 100 MCG/2ML IJ SOLN
INTRAMUSCULAR | Status: AC
  Filled 2021-02-23: qty 2

## 2021-02-23 MED ORDER — ONDANSETRON HCL 4 MG/2ML IJ SOLN: 4.0000 mg | Freq: Once | INTRAMUSCULAR | Status: DC | PRN

## 2021-02-23 MED ORDER — KETOROLAC TROMETHAMINE 15 MG/ML IJ SOLN: 15.0000 mg | Freq: Once | INTRAMUSCULAR | Status: AC

## 2021-02-23 MED ORDER — ROCURONIUM BROMIDE 100 MG/10ML IV SOLN
INTRAVENOUS | Status: DC | PRN
  Administered 2021-02-23: 50 mg via INTRAVENOUS

## 2021-02-23 MED ORDER — ONDANSETRON HCL 4 MG PO TABS
4.0000 mg | ORAL_TABLET | Freq: Four times a day (QID) | ORAL | Status: DC | PRN
Start: 2021-02-23 — End: 2021-02-25
  Administered 2021-02-25: 4 mg via ORAL
  Filled 2021-02-23: qty 1

## 2021-02-23 MED ORDER — ACETAMINOPHEN 325 MG PO TABS
325.0000 mg | ORAL_TABLET | Freq: Four times a day (QID) | ORAL | Status: DC | PRN
  Filled 2021-02-23: qty 2

## 2021-02-23 MED ORDER — ALBUTEROL SULFATE HFA 108 (90 BASE) MCG/ACT IN AERS
2.0000 | INHALATION_SPRAY | Freq: Four times a day (QID) | RESPIRATORY_TRACT | Status: DC | PRN
  Filled 2021-02-23: qty 6.7

## 2021-02-23 MED ORDER — CHLORHEXIDINE GLUCONATE 0.12 % MT SOLN: 15.0000 mL | Freq: Once | OROMUCOSAL | Status: AC

## 2021-02-23 MED ORDER — PHENYLEPHRINE HCL (PRESSORS) 10 MG/ML IV SOLN
INTRAVENOUS | Status: DC | PRN
  Administered 2021-02-23: 200 ug via INTRAVENOUS

## 2021-02-23 MED ORDER — TRAMADOL HCL 50 MG PO TABS
50.0000 mg | ORAL_TABLET | Freq: Four times a day (QID) | ORAL | Status: DC | PRN
Start: 2021-02-23 — End: 2021-02-25

## 2021-02-23 MED ORDER — FAMOTIDINE 20 MG PO TABS
ORAL_TABLET | ORAL | Status: AC
  Administered 2021-02-23: 20 mg via ORAL
  Filled 2021-02-23: qty 1

## 2021-02-23 MED ORDER — TRANEXAMIC ACID 1000 MG/10ML IV SOLN
INTRAVENOUS | Status: DC | PRN
  Administered 2021-02-23: 1000 mg via TOPICAL

## 2021-02-23 MED ORDER — CEFAZOLIN SODIUM-DEXTROSE 2-4 GM/100ML-% IV SOLN
INTRAVENOUS | Status: AC
  Administered 2021-02-24: 2 g via INTRAVENOUS
  Filled 2021-02-23: qty 100

## 2021-02-23 MED ORDER — FLEET ENEMA 7-19 GM/118ML RE ENEM: 1.0000 | ENEMA | Freq: Once | RECTAL | Status: DC | PRN

## 2021-02-23 MED ORDER — KETOROLAC TROMETHAMINE 15 MG/ML IJ SOLN: 7.5000 mg | Freq: Four times a day (QID) | INTRAMUSCULAR | Status: DC

## 2021-02-23 MED ORDER — FENTANYL CITRATE (PF) 100 MCG/2ML IJ SOLN
25.0000 ug | INTRAMUSCULAR | Status: AC | PRN
  Administered 2021-02-23 (×6): 25 ug via INTRAVENOUS

## 2021-02-23 MED ORDER — LOSARTAN POTASSIUM 50 MG PO TABS
50.0000 mg | ORAL_TABLET | ORAL | Status: DC
  Administered 2021-02-25: 50 mg via ORAL
  Filled 2021-02-23 (×2): qty 1

## 2021-02-23 MED ORDER — DIPHENHYDRAMINE HCL 12.5 MG/5ML PO ELIX: 12.5000 mg | ORAL_SOLUTION | ORAL | Status: DC | PRN

## 2021-02-23 MED ORDER — CLOTRIMAZOLE 1 % EX CREA
TOPICAL_CREAM | Freq: Two times a day (BID) | CUTANEOUS | Status: DC
  Filled 2021-02-23: qty 15

## 2021-02-23 MED ORDER — SODIUM CHLORIDE 0.9 % IV SOLN: INTRAVENOUS | Status: DC

## 2021-02-23 MED ORDER — HYDROMORPHONE HCL 1 MG/ML IJ SOLN: 0.2500 mg | INTRAMUSCULAR | Status: DC | PRN

## 2021-02-23 MED ORDER — CEFAZOLIN SODIUM-DEXTROSE 2-4 GM/100ML-% IV SOLN
2.0000 g | Freq: Once | INTRAVENOUS | Status: AC
  Administered 2021-02-23: 2 g via INTRAVENOUS

## 2021-02-23 MED ORDER — OXYCODONE HCL 5 MG PO TABS
5.0000 mg | ORAL_TABLET | ORAL | Status: DC | PRN
  Administered 2021-02-24: 5 mg via ORAL
  Administered 2021-02-25 (×2): 10 mg via ORAL
  Filled 2021-02-23 (×2): qty 2
  Filled 2021-02-23: qty 1

## 2021-02-23 MED ORDER — BISACODYL 10 MG RE SUPP: 10.0000 mg | Freq: Every day | RECTAL | Status: DC | PRN

## 2021-02-23 MED ORDER — BUPIVACAINE-EPINEPHRINE (PF) 0.5% -1:200000 IJ SOLN
INTRAMUSCULAR | Status: AC
  Filled 2021-02-23: qty 30

## 2021-02-23 MED ORDER — KETOROLAC TROMETHAMINE 15 MG/ML IJ SOLN
7.5000 mg | Freq: Four times a day (QID) | INTRAMUSCULAR | Status: AC
  Administered 2021-02-24 (×3): 7.5 mg via INTRAVENOUS
  Filled 2021-02-23 (×3): qty 1

## 2021-02-23 MED ORDER — ONDANSETRON HCL 4 MG/2ML IJ SOLN
INTRAMUSCULAR | Status: DC | PRN
  Administered 2021-02-23: 4 mg via INTRAVENOUS

## 2021-02-23 MED ORDER — BUPIVACAINE LIPOSOME 1.3 % IJ SUSP
INTRAMUSCULAR | Status: AC
  Filled 2021-02-23: qty 20

## 2021-02-23 MED ORDER — MOMETASONE FURO-FORMOTEROL FUM 100-5 MCG/ACT IN AERO
2.0000 | INHALATION_SPRAY | Freq: Two times a day (BID) | RESPIRATORY_TRACT | Status: DC | PRN
  Filled 2021-02-23: qty 8.8

## 2021-02-23 MED ORDER — MIDAZOLAM HCL 2 MG/2ML IJ SOLN
INTRAMUSCULAR | Status: AC
  Filled 2021-02-23: qty 2

## 2021-02-23 SURGICAL SUPPLY — 76 items
APL PRP STRL LF DISP 70% ISPRP (MISCELLANEOUS) ×1
BASEPLATE AUG MED W-TAPER (Plate) ×1 IMPLANT
BEARING HUMERAL SHLDER 36M STD (Shoulder) IMPLANT
BIT DRILL 2.7 W/STOP DISP (BIT) ×1 IMPLANT
BIT DRILL TWIST 2.7 (BIT) ×1 IMPLANT
BLADE SAW SAG 25X90X1.19 (BLADE) ×2 IMPLANT
BNDG COHESIVE 4X5 TAN STRL (GAUZE/BANDAGES/DRESSINGS) ×2 IMPLANT
BRNG HUM STD 36 RVRS SHLDR (Shoulder) ×1 IMPLANT
BSPLAT GLND MED AUG TPR ADPR (Plate) ×1 IMPLANT
CHLORAPREP W/TINT 26 (MISCELLANEOUS) ×2 IMPLANT
COOLER POLAR GLACIER W/PUMP (MISCELLANEOUS) ×2 IMPLANT
COVER BACK TABLE REUSABLE LG (DRAPES) ×2 IMPLANT
COVER WAND RF STERILE (DRAPES) ×2 IMPLANT
DRAPE 3/4 80X56 (DRAPES) ×4 IMPLANT
DRAPE IMP U-DRAPE 54X76 (DRAPES) ×4 IMPLANT
DRAPE INCISE IOBAN 66X45 STRL (DRAPES) ×4 IMPLANT
DRSG OPSITE POSTOP 4X8 (GAUZE/BANDAGES/DRESSINGS) ×2 IMPLANT
ELECT BLADE 6.5 EXT (BLADE) IMPLANT
ELECT CAUTERY BLADE 6.4 (BLADE) ×2 IMPLANT
ELECT REM PT RETURN 9FT ADLT (ELECTROSURGICAL) ×2
ELECTRODE REM PT RTRN 9FT ADLT (ELECTROSURGICAL) ×1 IMPLANT
GAUZE XEROFORM 1X8 LF (GAUZE/BANDAGES/DRESSINGS) ×2 IMPLANT
GLENOID SPHERE STD STRL 36MM (Orthopedic Implant) ×1 IMPLANT
GLOVE SRG 8 PF TXTR STRL LF DI (GLOVE) ×2 IMPLANT
GLOVE SURG ENC MOIS LTX SZ7.5 (GLOVE) ×8 IMPLANT
GLOVE SURG ENC MOIS LTX SZ8 (GLOVE) ×8 IMPLANT
GLOVE SURG UNDER LTX SZ8 (GLOVE) ×2 IMPLANT
GLOVE SURG UNDER POLY LF SZ8 (GLOVE) ×4
GOWN STRL REUS W/ TWL LRG LVL3 (GOWN DISPOSABLE) ×1 IMPLANT
GOWN STRL REUS W/ TWL XL LVL3 (GOWN DISPOSABLE) ×1 IMPLANT
GOWN STRL REUS W/TWL LRG LVL3 (GOWN DISPOSABLE) ×2
GOWN STRL REUS W/TWL XL LVL3 (GOWN DISPOSABLE) ×2
HOOD PEEL AWAY FLYTE STAYCOOL (MISCELLANEOUS) ×6 IMPLANT
ILLUMINATOR WAVEGUIDE N/F (MISCELLANEOUS) IMPLANT
IV NS IRRIG 3000ML ARTHROMATIC (IV SOLUTION) ×2 IMPLANT
KIT STABILIZATION SHOULDER (MISCELLANEOUS) ×2 IMPLANT
KIT TURNOVER KIT A (KITS) ×2 IMPLANT
MANIFOLD NEPTUNE II (INSTRUMENTS) ×2 IMPLANT
MASK FACE SPIDER DISP (MASK) ×2 IMPLANT
MAT ABSORB  FLUID 56X50 GRAY (MISCELLANEOUS) ×2
MAT ABSORB FLUID 56X50 GRAY (MISCELLANEOUS) ×1 IMPLANT
NDL SAFETY ECLIPSE 18X1.5 (NEEDLE) ×1 IMPLANT
NDL SPNL 20GX3.5 QUINCKE YW (NEEDLE) ×1 IMPLANT
NEEDLE HYPO 18GX1.5 SHARP (NEEDLE) ×2
NEEDLE HYPO 22GX1.5 SAFETY (NEEDLE) ×2 IMPLANT
NEEDLE SPNL 20GX3.5 QUINCKE YW (NEEDLE) ×2 IMPLANT
NS IRRIG 500ML POUR BTL (IV SOLUTION) ×2 IMPLANT
PACK ARTHROSCOPY SHOULDER (MISCELLANEOUS) ×2 IMPLANT
PAD ARMBOARD 7.5X6 YLW CONV (MISCELLANEOUS) ×2 IMPLANT
PAD WRAPON POLAR SHDR UNIV (MISCELLANEOUS) ×1 IMPLANT
PENCIL SMOKE EVACUATOR (MISCELLANEOUS) ×2 IMPLANT
PIN THREADED REVERSE (PIN) ×1 IMPLANT
PULSAVAC PLUS IRRIG FAN TIP (DISPOSABLE) ×2
REAMER GUIDE BUSHING SURG DISP (MISCELLANEOUS) ×1 IMPLANT
REAMER GUIDE W/SCREW AUG (MISCELLANEOUS) ×1 IMPLANT
SCREW BONE LOCKING 4.75X30X3.5 (Screw) ×1 IMPLANT
SCREW BONE LOCKING 4.75X35X3.5 (Screw) ×1 IMPLANT
SCREW BONE STRL 6.5MMX25MM (Screw) ×1 IMPLANT
SCREW LOCKING 4.75MMX15MM (Screw) ×2 IMPLANT
SHOULDER HUMERAL BEAR 36M STD (Shoulder) ×2 IMPLANT
SLING ULTRA II M (MISCELLANEOUS) ×2 IMPLANT
SPONGE LAP 18X18 RF (DISPOSABLE) ×2 IMPLANT
STAPLER SKIN PROX 35W (STAPLE) ×2 IMPLANT
STEM HUMERAL STRL 9MX55MM (Stem) ×1 IMPLANT
SUT ETHIBOND 0 MO6 C/R (SUTURE) ×2 IMPLANT
SUT FIBERWIRE #2 38 BLUE 1/2 (SUTURE) ×6
SUT VIC AB 0 CT1 36 (SUTURE) ×2 IMPLANT
SUT VIC AB 2-0 CT1 27 (SUTURE) ×4
SUT VIC AB 2-0 CT1 TAPERPNT 27 (SUTURE) ×2 IMPLANT
SUTURE FIBERWR #2 38 BLUE 1/2 (SUTURE) ×4 IMPLANT
SYR 10ML LL (SYRINGE) ×2 IMPLANT
SYR 30ML LL (SYRINGE) ×2 IMPLANT
TIP FAN IRRIG PULSAVAC PLUS (DISPOSABLE) ×1 IMPLANT
TOWEL OR 17X26 4PK STRL BLUE (TOWEL DISPOSABLE) ×2 IMPLANT
TRAY HUM REV SHOULDER STD +6 (Shoulder) ×1 IMPLANT
WRAPON POLAR PAD SHDR UNIV (MISCELLANEOUS) ×2

## 2021-02-23 NOTE — Anesthesia Preprocedure Evaluation (Addendum)
Anesthesia Evaluation  Patient identified by MRN, date of birth, ID band Patient awake    Reviewed: Allergy & Precautions, H&P , NPO status , reviewed documented beta blocker date and time   Airway Mallampati: II  TM Distance: >3 FB Neck ROM: limited    Dental  (+) Upper Dentures, Missing   Pulmonary shortness of breath and with exertion, asthma , sleep apnea , COPD, former smoker,  Counseled re: OSA with GA. Pt to be monitored tonight   Pulmonary exam normal        Cardiovascular hypertension, + DOE  Normal cardiovascular exam+ Valvular Problems/Murmurs AS   12/2019 ECHO 1. Left ventricular ejection fraction, by estimation, is 60 to 65%. The  left ventricle has normal function. The left ventricle has no regional  wall motion abnormalities. Left ventricular diastolic parameters are  consistent with Grade I diastolic  dysfunction (impaired relaxation).  2. Right ventricular systolic function is normal. The right ventricular  size is normal. There is moderately elevated pulmonary artery systolic  pressure.  3. The mitral valve is normal in structure. Mild mitral valve  regurgitation.  4. The aortic valve is grossly normal. Aortic valve regurgitation is mild  to moderate. Mild to moderate aortic valve sclerosis/calcification is  present, without any evidence of aortic stenosis.   RBBB on EKG   Neuro/Psych TIA Neuromuscular disease negative psych ROS   GI/Hepatic GERD  Medicated and Controlled,  Endo/Other  diabetes  Renal/GU Renal disease  negative genitourinary   Musculoskeletal   Abdominal   Peds negative pediatric ROS (+)  Hematology  (+) Blood dyscrasia, anemia ,   Anesthesia Other Findings Past Medical History: No date: Anemia No date: Asthma No date: Chronic airway obstruction (HCC) No date: Chronic kidney disease     Comment:  F/U WITH DR LATEEF No date: Diabetes mellitus without complication  (HCC) No date: Dyspnea     Comment:  WITH EXERTION No date: GERD (gastroesophageal reflux disease) No date: Heart murmur     Comment:  ASYMPTOMATIC No date: History of adenomatous polyp of colon No date: History of bone density study No date: Hyperlipidemia No date: Hypertension No date: Idiopathic peripheral neuropathy No date: Morbid obesity (HCC) No date: Osteoporosis 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 No date: Pulmonary fibrosis (HCC) No date: Sleep apnea     Comment:  Hypersomnia with sleep apnea-DOES NOT USE CPAP No date: TIA (transient ischemic attack) Past Surgical History: No date: ABDOMINAL HYSTERECTOMY No date: BACK SURGERY No date: BLADDER SUSPENSION No date: CESAREAN SECTION No date: COLONOSCOPY No date: DILATION AND CURETTAGE OF UTERUS No date: ESOPHAGOGASTRODUODENOSCOPY 10/15/2016: LUMBAR LAMINECTOMY/DECOMPRESSION MICRODISCECTOMY; N/A     Comment:  Procedure: L3-L5 Laminectomy ;  Surgeon: Deetta Perla,               MD;  Location: [facility];  Service: Neurosurgery;                Laterality: N/A; 11/05/2016: LUMBAR WOUND DEBRIDEMENT; N/A     Comment:  Procedure: LUMBAR WOUND DEBRIDEMENT;  Surgeon: Deetta Perla, MD;  Location: [facility];  Service: Neurosurgery;                Laterality: N/A; No date: TUBAL LIGATION   Reproductive/Obstetrics                             Anesthesia Physical  Anesthesia Plan  ASA: III  Anesthesia Plan: General   Post-op Pain Management:    Induction: Intravenous  PONV Risk Score and Plan: Ondansetron and Treatment may vary due to age or medical condition  Airway Management Planned: Oral ETT  Additional Equipment:   Intra-op Plan:   Post-operative Plan: Extubation in OR  Informed Consent: I have reviewed the patients History and Physical, chart, labs and discussed the procedure including the risks, benefits and alternatives for the proposed anesthesia with the patient or  authorized representative who has indicated his/her understanding and acceptance.     Dental Advisory Given  Plan Discussed with: CRNA  Anesthesia Plan Comments: (Patient reports recent right rib irritation with pain and increased when coughing.  With the patient's history of COPD, Pulmonary fibrosis and recent injury to ribs, I do not think a regional block on the opposite side would be in her best interests.  I discussed the case with the surgeon who wishes to go ahead without the block  And order some Incentive spirometry to minimize potential for post op resp. Issues.)       Anesthesia Quick Evaluation

## 2021-02-23 NOTE — H&P (Signed)
History of Present Illness:  [name] [name] is a 70 y.o. female who presents for evaluation and treatment of her progressively worsening left shoulder pain, stiffness, and weakness. The patient was last seen for the symptoms by myself last year. At the time, her right shoulder symptoms were more severe so she underwent a reverse right total shoulder arthroplasty, from which she is doing well. Over the past several months, her left shoulder symptoms have worsened. She followed up with Cameron Proud, PA-C, in February, 2022. Repeat x-rays of her shoulder obtained at this visit showed progression of her left glenohumeral degenerative changes. Therefore, the patient was sent for a CT scan of the shoulder and advised to follow-up with me to discuss potential surgical intervention. The patient notes significant pain in her shoulder which she rates at 10/10, and for which she has been taking oxycodone and Tylenol as necessary with limited benefit. She localizes the pain to the anterior and anterolateral aspects of her shoulder. She has pain with any attempted active or passive motion of the shoulder. She is unable to raise her arm even to shoulder level. She has pain at night. She denies any recent reinjury to the shoulder, and denies any numbness or paresthesias down her arm to her hand.  Current Outpatient Medications: . ADVAIR HFA 115-21 mcg/actuation inhaler INHALE 2 INHALATIONS INTO THE LUNGS EVERY 12 (TWELVE) HOURS TO REPLACE DULERA  . albuterol 90 mcg/actuation inhaler Inhale 2 inhalations into the lungs every 6 (six) hours as needed for Wheezing 6.7 Inhaler 4  . alcohol swabs PadM Apply 100 each topically once daily 100 each 1  . blood glucose diagnostic test strip 1 each (1 strip total) once daily Use as instructed. 100 each 1  . blood glucose meter kit as directed 1 each 1  . clotrimazole-betamethasone (LOTRISONE) 1-0.05 % cream APPLY TO AFFECTED AREA TWICE A DAY 45 g 2  . lancets Use 1 each once daily Use  as instructed. 100 each 3  . losartan (COZAAR) 50 MG tablet TAKE 1 TABLET BY MOUTH EVERY DAY 90 tablet 1  . oxyCODONE (ROXICODONE) 5 MG immediate release tablet Take 1 tablet (5 mg total) by mouth 2 (two) times daily as needed for Pain for up to 30 days 60 tablet 0  . mirtazapine (REMERON) 15 MG tablet Take 1 tablet (15 mg total) by mouth nightly for 180 days 90 tablet 1  . mometasone-formoterol (DULERA) 100-5 mcg/actuation inhaler Inhale 2 inhalations into the lungs 2 (two) times daily (Patient not taking: Reported on 02/03/2021) 1 Inhaler 12  . predniSONE (DELTASONE) 10 MG tablet 2 tabs daily for 4 days, 1 tab daily for 4 days (Patient not taking: No sig reported) 12 tablet 0  . simvastatin (ZOCOR) 20 MG tablet TAKE 1 TABLET BY MOUTH EVERY DAY EVERY NIGHT (Patient not taking: No sig reported) 90 tablet 1   Allergies:  . Codeine Dizziness and Other (Nausea And Vomiting)  . Lasix [Furosemide] Dizziness  . Qualaquin [Quinine Sulfate] Dizziness  . Ultram [Tramadol] Dizziness  . Penicillin V Potassium Vomiting  Remote, only N/V, nothing concerning for true allergy, okay for trial penicillins, other beta lactams based on history (infectious disease 11/2016).   Past Medical History:  . Anemia  . Chronic airway obstruction, not elsewhere classified , unspecified (CMS-HCC)  . Essential hypertension, benign  . Gastroesophageal reflux disease without esophagitis 12/14/2014  . History of adenomatous polyp of colon 09/08/2014  . History of bone density study 09/19/2007  . Hypersomnia with sleep  apnea, unspecified  . Morbid obesity with BMI of 40.0-44.9, adult (CMS-HCC) 10/06/2014  . Obesity  . Osteoporosis, post-menopausal  . Other and unspecified hyperlipidemia  . Plantar fascial fibromatosis  . Pulmonary fibrosis (CMS-HCC)  . TIA (transient ischemic attack)  . Type II or unspecified type diabetes mellitus without mention of complication, not stated as uncontrolled (CMS-HCC)  Non-insulin dependent.  PCP Dr. Ginette Pitman.  Marland Kitchen Unspecified hereditary and idiopathic peripheral neuropathy   Past Surgical History:  . bladder suspension  . COLONOSCOPY 04/19/2008 (Adenomatous Polyps: CBF 09/2011)  . COLONOSCOPY 12/29/2014 (Adenomatous Polyps: CBF 12/2017; Recall Ltr mailed 11/26/2017)  . COLONOSCOPY 08/30/2020 (Tubular adenoma/Poor prep/Repeat 33yrCTL)  . DEBRIDEMENT SKIN/SUBCUTANEOUS TISSSUE N/A 11/16/2016  Procedure: DEBRIDEMENT, BACK; SUBCUTANEOUS TISSUE (INCLUDES EPIDERMIS AND DERMIS, IF PERFORMED); FIRST 20 SQ CM OR LESS; Surgeon: SMalen Gauze MD; Location: [facility]; Service: Neurosurgery; Laterality: N/A;  . EGD 04/19/2008 (No repeat per RTE)  . EGD 12/29/2014 (No repeat per RTE)  . EGD 08/30/2020 (Gastritis/Duodenitis/Repeat as needed/CTL)  . HYSTERECTOMY  . L3-5 Laminectomy 10/15/2016 (Dr SDeetta Perla  . Reverse right total shoulder arthroplasty.. Right 05/05/2020 (Dr. PRoland Rack  . SECONDARY CLOSURE SURGICAL WOUND/DEHISCENCE EXTENSIVE/COMPLICATED N/A [id number] Procedure: SECONDARY CLOSURE OF SURGICAL WOUND OR DEHISCENCE, EXTENSIVE OR COMPLICATED; Surgeon: DRocky Morel MD; Location: [facility]; Service: Plastic Surgery; Laterality: N/A;  . ulcer surgery  . vein stripping   Family History:  . Stroke Mother  . Myocardial Infarction (Heart attack) Mother  . No Known Problems Father  . Breast cancer Sister  . Stroke Sister  . Myocardial Infarction (Heart attack) Sister   Social History:   Socioeconomic History:  .Marland KitchenMarital status: Single  Tobacco Use  . Smoking status: Former Smoker  Types: Cigarettes  Quit date: 03/22/2000  Years since quitting: 20.8  . Smokeless tobacco: Never Used  Vaping Use  . Vaping Use: Never used  Substance and Sexual Activity  . Alcohol use: No  Alcohol/week: 0.0 standard drinks  . Drug use: No  . Sexual activity: Not Currently  Partners: Male   Review of Systems:  A comprehensive 14 point ROS was performed, reviewed, and the  pertinent orthopaedic findings are documented in the HPI.  Physical Exam: Vitals:  02/03/21 1148  BP: 122/76  Weight: 65.3 kg (144 lb)  Height: 149.9 cm (_0 )  PainSc: 10-Worst pain ever  PainLoc: Shoulder   General/Constitutional: The patient appears to be well-nourished, well-developed, and in no acute distress. Neuro/Psych: Normal mood and affect, oriented to person, place and time. Eyes: Non-icteric. Pupils are equal, round, and reactive to light, and exhibit synchronous movement. ENT: Unremarkable. Lymphatic: No palpable adenopathy. Respiratory: Lungs clear to auscultation, Normal chest excursion, No wheezes and Non-labored breathing Cardiovascular: Regular rate and rhythm with 2+ holosystolic ejection murmur and No edema, swelling or tenderness, except as noted in detailed exam. Integumentary: No impressive skin lesions present, except as noted in detailed exam. Musculoskeletal: Unremarkable, except as noted in detailed exam.  Left shoulder exam: SKIN: Normal SWELLING: None WARMTH: None LYMPH NODES: No adenopathy palpable CREPITUS: Moderate glenohumeral crepitance TENDERNESS: Mild-moderately tender over anterolateral shoulder ROM (active):  Forward flexion: 70 degrees Abduction: 65 degrees Internal rotation: Left buttock ROM (passive):  Forward flexion: 90 degrees Abduction: 80 degrees  ER/IR at 90 abd: 65 degrees/30 degrees  She experiences moderate to severe pain with all motions.  STRENGTH: Forward flexion: 3/5 Abduction: 3/5 External rotation: 3-3+/5 Internal rotation: 3+-4/5 Pain with RC testing: Moderate pain with resisted forward flexion and abduction,  mild pain with internal and external rotation  STABILITY: Normal  SPECIAL TESTS: Luan Pulling' test: Moderately positive Speed's test: Not evaluated Capsulitis - pain w/ passive ER: No Crossed arm test: Minimally positive Crank: Not evaluated Anterior apprehension: Negative Posterior apprehension: Not  evaluated  She remains neurovascularly intact to the left upper extremity.  X-rays/MRI/Lab data:  Recent AP and Y-scapular views of the left shoulder are available for review and have been reviewed by myself. These films demonstrate severe degenerative changes of the glenohumeral joint with flattening of the humeral head and subchondral cystic changes of both the humerus and glenoid. The humeral head is high riding and abutting the undersurface of the acromion. There is evidence of an old malunited fracture of the proximal humeral shaft, but no new acute bony abnormalities are identified.  A recent CT scan of the left shoulder also is available for review. By report, the scan demonstrates "severe glenohumeral arthritis with chronic fragmentation and flattening of the superomedial humeral head" as well as a "high riding humeral head with severe supraspinatus and infraspinatus muscle atrophy". Again, these films were reviewed by myself and discussed with the patient.  Assessment: 1. Nontraumatic complete tear of left rotator cuff.  2. Rotator cuff arthropathy, left.   Plan: The treatment options were discussed with the patient. In addition, patient educational materials were provided regarding the diagnosis and treatment options. The patient is quite frustrated by her symptoms and functional limitations, and is ready to consider more aggressive treatment options. Therefore, I have recommended a surgical procedure, specifically a reverse left total shoulder arthroplasty. The procedure was discussed with the patient, as were the potential risks (including bleeding, infection, nerve and/or blood vessel injury, persistent or recurrent pain, loosening and/or failure of the components, dislocation, need for further surgery, blood clots, strokes, heart attacks and/or arhythmias, pneumonia, etc.) and benefits. The patient states his/her understanding and wishes to proceed. All of the patient's questions and  concerns were answered. She can call any time with further concerns. She will follow up post-surgery, routine.   H&P reviewed and patient re-examined. No changes.

## 2021-02-23 NOTE — Op Note (Signed)
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  Post-Op Diagnosis:   Same  Procedure:   Reverse left total shoulder arthroplasty.  Surgeon:   Pascal Lux, MD  Assistant:   Cameron Proud, PA-C  Anesthesia:   GET  Findings:   As above.  Complications:   None  EBL:    150 cc  Fluids:   600 cc crystalloid  UOP:   None  TT:   None  Drains:   None  Closure:   Staples  Implants:   All press-fit Biomet Comprehensive system with a #9 micro-humeral stem, a +6 mm offset 40 mm humeral tray with a standard insert, and a medium augmented mini-base plate with a 36 mm glenosphere.  Brief Clinical Note:   The patient is a 70 year old female with a long history of progressively worsening pain and weakness of her left shoulder. Her symptoms have progressed despite medications, activity modification, etc. Her history and examination are consistent with a massive irreparable rotator cuff tear with advanced cuff arthropathy, all of which were confirmed by preoperative x-rays and a CT scan. The patient presents at this time for a reverse left total shoulder arthroplasty.  Procedure:   The patient was brought into the operating room and lain in the supine position. The patient then underwent general endotracheal intubation and anesthesia before the patient was repositioned in the beach chair position using the beach chair positioner. The left shoulder and upper extremity were prepped with ChloraPrep solution before being draped sterilely. Preoperative antibiotics were administered. A timeout was performed to verify the appropriate surgical site.    A standard anterior approach to the shoulder was made through an approximately 4-5 inch incision. The incision was carried down through the subcutaneous tissues to expose the deltopectoral fascia. The interval between the deltoid and pectoralis  muscles was identified and this plane developed, retracting the cephalic vein laterally with the deltoid muscle. The conjoined tendon was identified. Its lateral margin was dissected and the Kolbel self-retraining retractor inserted. The "three sisters" were identified and cauterized. Bursal tissues were removed to improve visualization. The subscapularis tendon was released from its attachment to the lesser tuberosity 1 cm proximal to its insertion and several tagging sutures placed. The inferior capsule was released with care after identifying and protecting the axillary nerve. The proximal humeral cut was made at approximately 25 of retroversion using the extra-medullary guide.   Attention was redirected to the glenoid. The labrum was debrided circumferentially before the center of the glenoid was marked with electrocautery. Given the amount of superior glenoid erosion and cystic changes there were, as well as the fact that there was not that much glenoid bone to begin with, it was felt best to use a medium augmented baseplate. The guidewire was drilled into the glenoid neck using the appropriate guide. After verifying its position, it was overreamed with the mini-baseplate reamer to ream the inferior 50% of the glenoid.  The medium guide was positioned to verify that this indeed was the proper amount of correction before the inferior drill hole was placed. The augment drill guide was then positioned and the appropriate reaming of the superior portion of the glenoid performed.   The permanent medium augmented mini-baseplate was impacted into place. It was stabilized with a 25 x 6.5 mm central screw and four peripheral locking screws. The permanent 36 mm glenosphere was then impacted  into place and its Morse taper locking mechanism verified using manual distraction.  Attention was directed to the humeral side. The humeral canal was reamed sequentially beginning with the end-cutting reamer then progressing  from a 4 mm reamer up to a 10 mm reamer. This provided excellent circumferential chatter. The canal was broached beginning with a #7 broach and progressing to a #10 broach. This was left in place and a trial reduction performed using both the standard and +6 mm offset trial humeral platform. The shoulder proved too difficult to reduce even with the +6 mm offset trial platform. Therefore, an additional 3 to 4 mm of humeral cut was performed after removing the trial stem, maintaining the appropriate retroversion. The 9 mm broach was inserted and found to fit quite well. Therefore this was left in place and a repeat trial reduction performed using the +6 mm offset standard humeral platform. The arm demonstrated excellent range of motion as the hand could be brought across the chest to the opposite shoulder and brought to the top of the patient's head and to the patient's ear. The shoulder appeared stable throughout this range of motion. The joint was dislocated and the trial components removed. The permanent #9 micro-stem was impacted into place with care taken to maintain the appropriate version. The permanent +6 mm offset 40 mm humeral platform with the standard insert was put together on the back table and impacted into place. Again, the [facility] taper locking mechanism was verified using manual distraction. The shoulder was relocated using two finger pressure and again placed through a range of motion with the findings as described above.  The wound was copiously irrigated with sterile saline solution using the jet lavage system before a total of 20 cc of Exparel diluted out to 60 cc with normal saline and 30 cc of 0.5% Sensorcaine with epinephrine was injected into the pericapsular and peri-incisional tissues to help with postoperative analgesia. The subscapularis tendon was reapproximated using #2 FiberWire interrupted sutures. The deltopectoral interval was closed using #0 Vicryl interrupted sutures before the  subcutaneous tissues were closed using 2-0 Vicryl interrupted sutures. The skin was closed using staples. Prior to closing the skin, 1 g of transexemic acid in 10 cc of normal saline was injected intra-articularly to help with postoperative bleeding. A sterile occlusive dressing was applied to the wound before the arm was placed into a shoulder immobilizer with an abduction pillow. A Polar Care system also was applied to the shoulder. The patient was then transferred back to a hospital bed before being awakened, extubated, and returned to the recovery room in satisfactory condition after tolerating the procedure well.

## 2021-02-23 NOTE — Anesthesia Procedure Notes (Signed)
Procedure Name: Intubation Date/Time: 02/23/2021 11:17 AM Performed by: Aline Brochure, CRNA Pre-anesthesia Checklist: Patient identified, Patient being monitored, Timeout performed, Emergency Drugs available and Suction available Patient Re-evaluated:Patient Re-evaluated prior to induction Oxygen Delivery Method: Circle system utilized Preoxygenation: Pre-oxygenation with 100% oxygen Induction Type: IV induction Ventilation: Mask ventilation without difficulty Laryngoscope Size: Mac, 3 and McGraph Grade View: Grade I Tube type: Oral Tube size: 7.0 mm Number of attempts: 1 Airway Equipment and Method: Stylet and Video-laryngoscopy Placement Confirmation: ETT inserted through vocal cords under direct vision,  positive ETCO2 and breath sounds checked- equal and bilateral Secured at: 21 cm Tube secured with: Tape Dental Injury: Teeth and Oropharynx as per pre-operative assessment

## 2021-02-23 NOTE — Transfer of Care (Signed)
Immediate Anesthesia Transfer of Care Note  Patient: [name] [name]  Procedure(s) Performed: REVERSE SHOULDER ARTHROPLASTY (Left Shoulder)  Patient Location: PACU  Anesthesia Type:General  Level of Consciousness: sedated  Airway & Oxygen Therapy: Patient Spontanous Breathing and Patient connected to face mask oxygen  Post-op Assessment: Report given to RN and Post -op Vital signs reviewed and stable  Post vital signs: Reviewed and stable  Last Vitals:  Vitals Value Taken Time  BP 124/63 02/23/21 1347  Temp    Pulse 98 02/23/21 1346  Resp 12 02/23/21 1346  SpO2 100 % 02/23/21 1346    Last Pain:  Vitals:   02/23/21 1346  TempSrc:   PainSc: 0-No pain         Complications: No complications documented.

## 2021-02-24 ENCOUNTER — Encounter: Payer: Self-pay | Admitting: Surgery

## 2021-02-24 LAB — BASIC METABOLIC PANEL WITH GFR
Anion gap: 6 (ref 5–15)
BUN: 28 mg/dL — ABNORMAL HIGH (ref 8–23)
CO2: 21 mmol/L — ABNORMAL LOW (ref 22–32)
Calcium: 7.3 mg/dL — ABNORMAL LOW (ref 8.9–10.3)
Chloride: 112 mmol/L — ABNORMAL HIGH (ref 98–111)
Creatinine, Ser: 1.06 mg/dL — ABNORMAL HIGH (ref 0.44–1.00)
GFR, Estimated: 58 mL/min — ABNORMAL LOW
Glucose, Bld: 146 mg/dL — ABNORMAL HIGH (ref 70–99)
Potassium: 3.8 mmol/L (ref 3.5–5.1)
Sodium: 139 mmol/L (ref 135–145)

## 2021-02-24 LAB — CBC
HCT: 19.6 % — ABNORMAL LOW (ref 36.0–46.0)
Hemoglobin: 6.1 g/dL — ABNORMAL LOW (ref 12.0–15.0)
MCH: 26.2 pg (ref 26.0–34.0)
MCHC: 31.1 g/dL (ref 30.0–36.0)
MCV: 84.1 fL (ref 80.0–100.0)
Platelets: 306 10*3/uL (ref 150–400)
RBC: 2.33 MIL/uL — ABNORMAL LOW (ref 3.87–5.11)
RDW: 14.8 % (ref 11.5–15.5)
WBC: 12.5 10*3/uL — ABNORMAL HIGH (ref 4.0–10.5)
nRBC: 0 % (ref 0.0–0.2)

## 2021-02-24 LAB — HEMOGLOBIN AND HEMATOCRIT, BLOOD
HCT: 26.8 % — ABNORMAL LOW (ref 36.0–46.0)
Hemoglobin: 9 g/dL — ABNORMAL LOW (ref 12.0–15.0)

## 2021-02-24 LAB — PREPARE RBC (CROSSMATCH)

## 2021-02-24 MED ORDER — SODIUM CHLORIDE 0.9% IV SOLUTION: Freq: Once | INTRAVENOUS | Status: AC

## 2021-02-24 NOTE — Progress Notes (Signed)
Pt accidentally removed IV, RN assessed pt for new IV placement, pt requested RN to attempt after she eats. RN educated on the need for the IV and that she needs blood transfusion and antibiotics. Pt gave verbal understanding.

## 2021-02-24 NOTE — Anesthesia Postprocedure Evaluation (Signed)
Anesthesia Post Note  Patient: [name] [name]  Procedure(s) Performed: REVERSE SHOULDER ARTHROPLASTY (Left Shoulder)  Patient location during evaluation: PACU Anesthesia Type: General Level of consciousness: awake and alert and oriented Pain management: pain level controlled Vital Signs Assessment: post-procedure vital signs reviewed and stable Respiratory status: spontaneous breathing Cardiovascular status: blood pressure returned to baseline Anesthetic complications: no   No complications documented.   Last Vitals:  Vitals:   02/24/21 1303 02/24/21 1321  BP: 117/65 117/64  Pulse: 95 95  Resp: 18 16  Temp: 36.9 C 36.8 C  SpO2: 100% 99%    Last Pain:  Vitals:   02/24/21 1321  TempSrc: Oral  PainSc:                  Shakinah Navis

## 2021-02-24 NOTE — Progress Notes (Signed)
PT Cancellation Note:  Pt with low Hgb 6.1 with pending transfusion at this time. Will re-attempt evaluation later today.  Greggory Stallion, PT, DPT [phone]

## 2021-02-24 NOTE — Progress Notes (Signed)
  Subjective: 1 Day Post-Op Procedure(s) (LRB): REVERSE SHOULDER ARTHROPLASTY (Left) Patient reports pain as mild.   Patient is well but Hg down to 6.1 this AM. Plan is to go Home after hospital stay. Negative for chest pain and shortness of breath Fever: no Gastrointestinal:Negative for nausea and vomiting  Objective: Vital signs in last 24 hours: Temp:  [97.2 F (36.2 C)-98.1 F (36.7 C)] 97.8 F (36.6 C) (05/20 0443) Pulse Rate:  [25-99] 82 (05/20 0443) Resp:  [12-20] 18 (05/20 0443) BP: (71-135)/(37-81) 124/64 (05/20 0443) SpO2:  [88 %-100 %] 99 % (05/20 0443) Weight:  [64.4 kg] 64.4 kg (05/19 0921)  Intake/Output from previous day:  Intake/Output Summary (Last 24 hours) at 02/24/2021 0754 Last data filed at 02/24/2021 0317 Gross per 24 hour  Intake 658.3 ml  Output 150 ml  Net 508.3 ml    Intake/Output this shift: No intake/output data recorded.  Labs: Recent Labs    02/24/21 0431  HGB 6.1*   Recent Labs    02/24/21 0431  WBC 12.5*  RBC 2.33*  HCT 19.6*  PLT 306   Recent Labs    02/24/21 0431  NA 139  K 3.8  CL 112*  CO2 21*  BUN 28*  CREATININE 1.06*  GLUCOSE 146*  CALCIUM 7.3*   No results for input(s): LABPT, INR in the last 72 hours.   EXAM General - Patient is Alert, Appropriate and Oriented Extremity - ABD soft Incision: dressing C/D/I No cellulitis present  Patient is intact to light touch to the left arm this AM. Able to flex and extend fingers without pain Dressing/Incision - clean, dry, no drainage Motor Function - intact, moving foot and toes well on exam.  Abdomen soft to palpation with active bowel sounds. Negative homans to bilateral lower extremities.  Past Medical History:  Diagnosis Date  . Anemia   . Asthma   . Chronic airway obstruction ([place])   . Chronic kidney disease    F/U WITH DR LATEEF  . Diabetes mellitus without complication ([place])   . Dyspnea    WITH EXERTION  . GERD (gastroesophageal reflux disease)    . Heart murmur    ASYMPTOMATIC  . History of adenomatous polyp of colon   . History of bone density study   . Hyperlipidemia   . Hypertension   . Idiopathic peripheral neuropathy   . Morbid obesity ([place])   . Osteoporosis   . Plantar fascial fibromatosis   . Pulmonary fibrosis ([place])   . TIA (transient ischemic attack)     Assessment/Plan: 1 Day Post-Op Procedure(s) (LRB): REVERSE SHOULDER ARTHROPLASTY (Left) Active Problems:   Status post reverse arthroplasty of shoulder, left  Estimated body mass index is 28.68 kg/m as calculated from the following:   Height as of this encounter: 4\' 11"  (1.499 m).   Weight as of this encounter: 64.4 kg. Advance diet Up with therapy D/C IV fluids when tolerating po intake.  Labs reviewed this AM. Hg 6.1 this AM, will transfuse today. Up with therapy if able with transfusion. Begin working on BM. Plan will be for d/c home, likely tomorrow.  DVT Prophylaxis - Lovenox, Foot Pumps and TED hose Non-weightbearing to the left arm.  Raquel Chiara Coltrin, PA-C [facility] 02/24/2021, 7:54 AM

## 2021-02-24 NOTE — Progress Notes (Signed)
IV attempt x 1 - 20 g rac - pt unable to remain still, screamed and cried during insertion attempt as well- stated multiple times nurse was inserting iv incorrectly - attempt aborted and site covered with dry  Gauze and tape and IV team consulted .

## 2021-02-24 NOTE — Evaluation (Signed)
Occupational Therapy Evaluation Patient Details Name: [name] [name] MRN: [id number] DOB: 05-21-1951 Today's Date: 02/24/2021    History of Present Illness 70 y/o female s/p L reverse total shoulder replacement. PMH included TIA, DMII, HTN, and GERD.   Clinical Impression   Patient presenting with decreased I in self care, balance, functional mobility/transfer, endurance, and safety awareness.  Patient reports being mod I with use of RW PTA. Pt lives alone in an apartment but has family support. She uses public transportation or family assist with getting to appointments and IADL tasks. Pill box used to manage medications. Pt with hemoglobin of 6.1 and currently receiving blood transfusion. OT messaged MD via secure chat to initiate evaluation and education. Pt was able to verbalize correct information related to polar care and self care. OT continued education on these topics, proper sling use, and hand/wrist/elbow exercises with handout provided on topics. Pt felt comfortable with information and reports family will be present to assist at discharge.  Patient will benefit from acute OT to increase overall independence in the areas of ADLs, functional mobility, and safety awareness in order to safely discharge home.    Follow Up Recommendations  Supervision/Assistance - 24 hour;Home health OT    Equipment Recommendations  None recommended by OT       Precautions / Restrictions Precautions Precautions: Fall;Shoulder Shoulder Interventions: Shoulder sling/immobilizer;At all times;Off for dressing/bathing/exercises Required Braces or Orthoses: Sling Restrictions Weight Bearing Restrictions: Yes LUE Weight Bearing: Non weight bearing Other Position/Activity Restrictions: sling at all times      Mobility Bed Mobility Overal bed mobility: Needs Assistance Bed Mobility: Rolling;Supine to Sit;Sit to Supine Rolling: Min assist   Supine to sit: Min assist Sit to supine: Min assist         Transfers                 General transfer comment: deferred secondary to hemoglobin 6.1 and getting blood transfusion        ADL either performed or assessed with clinical judgement   ADL Overall ADL's : Needs assistance/impaired     Grooming: Wash/dry hands;Wash/dry face;Sitting;Set up                                 General ADL Comments: Pt will likely need min - mod A for dressing tasks and has assist from daughter at home. Pt reports plans for sink bathing at home for safety and to maintain precautions.     Vision Patient Visual Report: No change from baseline              Pertinent Vitals/Pain Pain Assessment: Faces Pain Score: 8  Pain Location: L shoulder Pain Descriptors / Indicators: Discomfort Pain Intervention(s): Limited activity within patient's tolerance;Premedicated before session;Repositioned     Hand Dominance Right   Extremity/Trunk Assessment Upper Extremity Assessment Upper Extremity Assessment: LUE deficits/detail LUE: Unable to fully assess due to immobilization   Lower Extremity Assessment Lower Extremity Assessment: Defer to PT evaluation       Communication Communication Communication: No difficulties   Cognition Arousal/Alertness: Awake/alert Behavior During Therapy: WFL for tasks assessed/performed Overall Cognitive Status: Within Functional Limits for tasks assessed  Home Living Family/patient expects to be discharged to:: Private residence Living Arrangements: Children Available Help at Discharge: Family;Available 24 hours/day;Available PRN/intermittently Type of Home: Apartment Home Access: Level entry     Home Layout: One level     Bathroom Shower/Tub: Tub/shower unit;Curtain   Biochemist, clinical: Standard     Home Equipment: Environmental consultant - 2 wheels;Cane - single point;Hand held shower head;Other (comment)   Additional Comments: hemi  walker      Prior Functioning/Environment Level of Independence: Needs assistance  Gait / Transfers Assistance Needed: Pt reports short distance ambulation ADL's / Homemaking Assistance Needed: Pt's son and daughter assist with IADL tasks. she uses public transportation or family takes her to appointments. Pt uses weekly pill organizer.            OT Problem List: Decreased strength;Impaired balance (sitting and/or standing);Pain;Decreased knowledge of precautions;Decreased safety awareness;Decreased activity tolerance;Decreased knowledge of use of DME or AE;Impaired UE functional use      OT Treatment/Interventions: Self-care/ADL training;Manual therapy;Therapeutic exercise;Patient/family education;Balance training;Energy conservation;Therapeutic activities;DME and/or AE instruction;Cognitive remediation/compensation    OT Goals(Current goals can be found in the care plan section) Acute Rehab OT Goals Patient Stated Goal: to go home OT Goal Formulation: With patient Time For Goal Achievement: 03/10/21 Potential to Achieve Goals: Good ADL Goals Pt Will Perform Grooming: with supervision;standing Pt Will Perform Upper Body Dressing: with supervision;standing Pt Will Transfer to Toilet: with supervision;ambulating Pt Will Perform Toileting - Clothing Manipulation and hygiene: with supervision;sit to/from stand  OT Frequency: Min 2X/week   Barriers to D/C:    none known at this time          AM-PAC OT "6 Clicks" Daily Activity     Outcome Measure Help from another person eating meals?: A Little Help from another person taking care of personal grooming?: A Little Help from another person toileting, which includes using toliet, bedpan, or urinal?: A Little Help from another person bathing (including washing, rinsing, drying)?: A Little Help from another person to put on and taking off regular upper body clothing?: A Little Help from another person to put on and taking off  regular lower body clothing?: A Little 6 Click Score: 18   End of Session Nurse Communication: Mobility status  Activity Tolerance: Patient tolerated treatment well Patient left: in bed;with call bell/phone within reach;with bed alarm set  OT Visit Diagnosis: Unsteadiness on feet (R26.81);Muscle weakness (generalized) (M62.81);Pain Pain - Right/Left: Left Pain - part of body: Shoulder                Time: 1275-1700 OT Time Calculation (min): 20 min Charges:  OT General Charges $OT Visit: 1 Visit OT Evaluation $OT Eval Low Complexity: 1 Low OT Treatments $Self Care/Home Management : 8-22 mins  Darleen Crocker, MS, OTR/L , CBIS ascom [phone]  02/24/21, 4:50 PM

## 2021-02-25 LAB — BASIC METABOLIC PANEL
Anion gap: 8 (ref 5–15)
BUN: 34 mg/dL — ABNORMAL HIGH (ref 8–23)
CO2: 23 mmol/L (ref 22–32)
Calcium: 8.3 mg/dL — ABNORMAL LOW (ref 8.9–10.3)
Chloride: 108 mmol/L (ref 98–111)
Creatinine, Ser: 0.92 mg/dL (ref 0.44–1.00)
GFR, Estimated: >60 mL/min (ref >60)
Glucose, Bld: 80 mg/dL (ref 70–99)
Potassium: 4 mmol/L (ref 3.5–5.1)
Sodium: 139 mmol/L (ref 135–145)

## 2021-02-25 LAB — CBC
HCT: 29 % — ABNORMAL LOW (ref 36.0–46.0)
Hemoglobin: 9.6 g/dL — ABNORMAL LOW (ref 12.0–15.0)
MCH: 27.4 pg (ref 26.0–34.0)
MCHC: 33.1 g/dL (ref 30.0–36.0)
MCV: 82.9 fL (ref 80.0–100.0)
Platelets: 352 10*3/uL (ref 150–400)
RBC: 3.5 MIL/uL — ABNORMAL LOW (ref 3.87–5.11)
RDW: 14.6 % (ref 11.5–15.5)
WBC: 13 10*3/uL — ABNORMAL HIGH (ref 4.0–10.5)
nRBC: 0 % (ref 0.0–0.2)

## 2021-02-25 MED ORDER — ONDANSETRON HCL 4 MG PO TABS: 4.0000 mg | ORAL_TABLET | Freq: Four times a day (QID) | ORAL | 0 refills | Status: DC | PRN

## 2021-02-25 MED ORDER — ASPIRIN 325 MG PO TBEC: 325.0000 mg | DELAYED_RELEASE_TABLET | Freq: Every day | ORAL | 0 refills | Status: DC

## 2021-02-25 MED ORDER — OXYCODONE HCL 5 MG PO TABS: 5.0000 mg | ORAL_TABLET | ORAL | 0 refills | Status: AC | PRN

## 2021-02-25 NOTE — Progress Notes (Signed)
  Subjective: 2 Days Post-Op Procedure(s) (LRB): REVERSE SHOULDER ARTHROPLASTY (Left) Patient reports pain as mild.   Patient is well, Hg up to 9.6 after undergoing transfusion of 2 units of PRBC yesterday. Plan is to go Home after hospital stay. Negative for chest pain and shortness of breath Fever: no Gastrointestinal:Negative for nausea and vomiting  Objective: Vital signs in last 24 hours: Temp:  [97.7 F (36.5 C)-98.5 F (36.9 C)] 98.5 F (36.9 C) (05/21 0831) Pulse Rate:  [80-95] 92 (05/21 0831) Resp:  [16-18] 18 (05/21 0831) BP: (111-138)/(62-73) 111/73 (05/21 0831) SpO2:  [99 %-100 %] 100 % (05/21 0443)  Intake/Output from previous day:  Intake/Output Summary (Last 24 hours) at 02/25/2021 0913 Last data filed at 02/24/2021 1848 Gross per 24 hour  Intake 1947.54 ml  Output --  Net 1947.54 ml    Intake/Output this shift: No intake/output data recorded.  Labs: Recent Labs    02/24/21 0431 02/24/21 2048 02/25/21 0453  HGB 6.1* 9.0* 9.6*   Recent Labs    02/24/21 0431 02/24/21 2048 02/25/21 0453  WBC 12.5*  --  13.0*  RBC 2.33*  --  3.50*  HCT 19.6* 26.8* 29.0*  PLT 306  --  352   Recent Labs    02/24/21 0431 02/25/21 0453  NA 139 139  K 3.8 4.0  CL 112* 108  CO2 21* 23  BUN 28* 34*  CREATININE 1.06* 0.92  GLUCOSE 146* 80  CALCIUM 7.3* 8.3*   No results for input(s): LABPT, INR in the last 72 hours.   EXAM General - Patient is Alert, Appropriate and Oriented Extremity - ABD soft Incision: dressing C/D/I No cellulitis present  Patient is intact to light touch to the left arm this AM. Able to flex and extend fingers without pain Dressing/Incision - clean, dry, no drainage Motor Function - intact, moving foot and toes well on exam.  Abdomen soft to palpation with active bowel sounds. Negative homans to bilateral lower extremities.  Past Medical History:  Diagnosis Date  . Anemia   . Asthma   . Chronic airway obstruction ([place])   .  Chronic kidney disease    F/U WITH DR LATEEF  . Diabetes mellitus without complication ([place])   . Dyspnea    WITH EXERTION  . GERD (gastroesophageal reflux disease)   . Heart murmur    ASYMPTOMATIC  . History of adenomatous polyp of colon   . History of bone density study   . Hyperlipidemia   . Hypertension   . Idiopathic peripheral neuropathy   . Morbid obesity ([place])   . Osteoporosis   . Plantar fascial fibromatosis   . Pulmonary fibrosis ([place])   . TIA (transient ischemic attack)     Assessment/Plan: 2 Days Post-Op Procedure(s) (LRB): REVERSE SHOULDER ARTHROPLASTY (Left) Active Problems:   Status post reverse arthroplasty of shoulder, left  Estimated body mass index is 28.68 kg/m as calculated from the following:   Height as of this encounter: 4\' 11"  (1.499 m).   Weight as of this encounter: 64.4 kg. Advance diet Up with therapy D/C IV fluids when tolerating po intake.  Labs reviewed this AM. Hg 9.6 this morning. Patient is passing gas without pain. Plan for discharge home today with HHPT.  DVT Prophylaxis - Lovenox, Foot Pumps and TED hose Non-weightbearing to the left arm.  Raquel Meline Russaw, PA-C [facility] 02/25/2021, 9:13 AM

## 2021-02-25 NOTE — TOC Transition Note (Signed)
Transition of Care [facility]) - CM/SW Discharge Note   Patient Details  Name: [name] [name] MRN: [id number] Date of Birth: 12/01/1950  [facility] CM/SW Contact:  Boris Sharper, LCSW Phone Number: 02/25/2021, 12:57 PM   Clinical Narrative:    Pt medically stable for discharge per MD. Pt will be transported home by her daughter. No DME needed at this time. Pt will be followed for [facility] PT and OT by [facility] home health.   Final next level of care: Home w Home Health Services Barriers to Discharge: No Barriers Identified   Patient Goals and CMS Choice   CMS Medicare.gov Compare Post Acute Care list provided to:: Patient Choice offered to / list presented to : Patient  Discharge Placement                  Name of family member notified: Tiffany Patient and family notified of of transfer: 02/25/21  Discharge Plan and Services                          HH Arranged: PT,OT [place]: [place] Date [place]: 02/25/21 Time [place]: 1194 Representative spoke with at [place]: [place] Pack  Social Determinants of Health ([place]) Interventions     Readmission Risk Interventions Readmission Risk Prevention Plan 11/24/2019  Transportation Screening Complete  PCP or Specialist Appt within 3-5 Days Complete  HRI or [place] Complete  Social Work Consult for [place] Planning/Counseling Complete  Palliative Care Screening Not Applicable  Medication Review Press photographer) Complete  Some recent data might be hidden

## 2021-02-25 NOTE — Plan of Care (Signed)

## 2021-02-25 NOTE — Discharge Instructions (Signed)
Diet: As you were doing prior to hospitalization  ° °Shower:  May shower but keep the wounds dry, use an occlusive plastic wrap, NO SOAKING IN TUB.  If the bandage gets wet, change with a clean dry gauze. ° °Dressing:  You may change your dressing as needed. Change the dressing with sterile gauze dressing.   ° °Activity:  Increase activity slowly as tolerated, but follow the weight bearing instructions below.  No lifting or driving for 6 weeks. ° °Weight Bearing:   Non-weightbearing to the left arm. ° °To prevent constipation: you may use a stool softener such as - ° °Colace (over the counter) 100 mg by mouth twice a day  °Drink plenty of fluids (prune juice may be helpful) and high fiber foods °Miralax (over the counter) for constipation as needed.   ° °Itching:  If you experience itching with your medications, try taking only a single pain pill, or even half a pain pill at a time.  You may take up to 10 pain pills per day, and you can also use benadryl over the counter for itching or also to help with sleep.  ° °Precautions:  If you experience chest pain or shortness of breath - call 911 immediately for transfer to the hospital emergency department!! ° °If you develop a fever greater that 101 F, purulent drainage from wound, increased redness or drainage from wound, or calf pain-Call [facility]                                              °Follow- Up Appointment:  Please call for an appointment to be seen in 2 weeks at [facility] °

## 2021-02-25 NOTE — Discharge Summary (Signed)
Physician Discharge Summary  Patient ID: [name] [name] MRN: [id number] DOB/AGE: 11-27-1950 70 y.o.  Admit date: 02/23/2021 Discharge date: 02/25/2021  Admission Diagnoses:  Status post reverse arthroplasty of shoulder, left [Z96.612]  Discharge Diagnoses: Patient Active Problem List   Diagnosis Date Noted  . Status post reverse arthroplasty of shoulder, left 02/23/2021  . 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 Normocytic anemia 04/04/2020  . Cellulitis 01/03/2020  . Cellulitis of right arm 01/02/2020  . Sepsis ([place]) 01/02/2020  . Asthma 01/02/2020  . HTN (hypertension) 01/02/2020  . Anemia due to stage 3b chronic kidney disease ([place]) 12/22/2019  . Pedal edema 12/01/2019  . Fall 11/18/2019  . Closed rib fracture 11/18/2019  . Pulmonary fibrosis ([place])   . Asthma exacerbation   . Acute renal failure superimposed on stage 3a chronic kidney disease ([place])   . Chronic pain syndrome 09/15/2018  . AVD (aortic valve disease) 10/07/2017  . Benign essential hypertension 06/17/2017  . Nonrheumatic aortic valve insufficiency 06/17/2017  . CKD (chronic kidney disease), stage IIIa 05/20/2017  . Wound dehiscence 11/05/2016  . Lumbar stenosis 10/15/2016  . Renal impairment 10/05/2016  . Right lumbar radiculopathy 10/05/2016  . Complete tear of right rotator cuff 11/28/2015  . Rotator cuff arthropathy, right 11/28/2015  . Low vitamin D level 02/28/2015  . Gastroesophageal reflux disease without esophagitis 12/14/2014  . Morbid obesity with BMI of 40.0-44.9, adult ([place]) 10/06/2014  . Aortic stenosis 09/14/2014  . DOE (dyspnea on exertion) 09/08/2014  . History of adenomatous polyp of colon 09/08/2014  . Type II diabetes mellitus with renal manifestations ([place]) 03/22/2014  . Hyperlipidemia 03/22/2014    Past Medical History:  Diagnosis Date  . Anemia   . Asthma   . Chronic airway obstruction ([place])   . Chronic kidney disease    F/U WITH DR LATEEF  .  Diabetes mellitus without complication ([place])   . Dyspnea    WITH EXERTION  . GERD (gastroesophageal reflux disease)   . Heart murmur    ASYMPTOMATIC  . History of adenomatous polyp of colon   . History of bone density study   . Hyperlipidemia   . Hypertension   . Idiopathic peripheral neuropathy   . Morbid obesity ([place])   . Osteoporosis   . Plantar fascial fibromatosis   . Pulmonary fibrosis ([place])   . TIA (transient ischemic attack)      Transfusion: None.   Consultants (if any):   Discharged Condition: Improved  Hospital Course: [name] [name] is an 70 y.o. female who was admitted 02/23/2021 with a diagnosis of a massive irreparable rotator cuff tear with advanced cuff arthropathy of the left shoulder and went to the operating room on 02/23/2021 and underwent the above named procedures.    Surgeries: Procedure(s): REVERSE SHOULDER ARTHROPLASTY on 02/23/2021 Patient tolerated the surgery well. Taken to PACU where she was stabilized and then transferred to the orthopedic floor.  Started on Lovenox 40mg  q 24 hrs. Foot pumps applied bilaterally at 80 mm. Heels elevated on bed with rolled towels. No evidence of DVT. Negative [place]. Physical therapy started on day #1 for gait training and transfer. OT started day #1 for ADL and assisted devices.  Patient's IV was removed on POD1.  On POD1 patient's hemoglobin was 6.1, she received two units of PRBC and resulting Hg was 9.6.  Denies any SOB, chest pain or dizziness.  Implants: All press-fit Biomet Comprehensive system with a #9 micro-humeral stem, a +6 mm offset 40  mm humeral tray with a standard insert, and a medium augmented mini-base plate with a 36 mm glenosphere.  She was given perioperative antibiotics:  Anti-infectives (From admission, onward)   Start     Dose/Rate Route Frequency Ordered Stop   02/23/21 2200  ceFAZolin (ANCEF) IVPB 2g/100 mL premix        2 g 200 mL/hr over 30 Minutes Intravenous Every 6 hours 02/23/21 2101  02/24/21 1226   02/23/21 0930  ceFAZolin (ANCEF) IVPB 2g/100 mL premix        2 g 200 mL/hr over 30 Minutes Intravenous  Once 02/23/21 0925 02/23/21 1127   02/23/21 0928  ceFAZolin (ANCEF) 2-4 GM/100ML-% IVPB       Note to Pharmacy: [place]   : cabinet override      02/23/21 0928 02/24/21 1226    .  She was given sequential compression devices, early ambulation, and Lovenox for DVT prophylaxis.  She benefited maximally from the hospital stay and there were no complications.    Recent vital signs:  Vitals:   02/25/21 0443 02/25/21 0831  BP: 137/67 111/73  Pulse: 82 92  Resp: 16 18  Temp: 98.3 F (36.8 C) 98.5 F (36.9 C)  SpO2: 100%     Recent laboratory studies:  Lab Results  Component Value Date   HGB 9.6 (L) 02/25/2021   HGB 9.0 (L) 02/24/2021   HGB 6.1 (L) 02/24/2021   Lab Results  Component Value Date   WBC 13.0 (H) 02/25/2021   PLT 352 02/25/2021   Lab Results  Component Value Date   INR 0.96 10/09/2016   Lab Results  Component Value Date   NA 139 02/25/2021   K 4.0 02/25/2021   CL 108 02/25/2021   CO2 23 02/25/2021   BUN 34 (H) 02/25/2021   CREATININE 0.92 02/25/2021   GLUCOSE 80 02/25/2021    Discharge Medications:   Allergies as of 02/25/2021      Reactions   Codeine Nausea And Vomiting   Furosemide Other (See Comments)   Penicillin V Potassium    Other reaction(s): Unknown   Quinine    Other reaction(s): Unknown   Tramadol    Other reaction(s): Unknown   Tylenol [acetaminophen]    Tylenol-Codeine: Dizziness      Medication List    TAKE these medications   albuterol 108 (90 Base) MCG/ACT inhaler Commonly known as: VENTOLIN HFA Inhale 2 puffs into the lungs every 6 (six) hours as needed for wheezing or shortness of breath.   aspirin 325 MG EC tablet Take 1 tablet (325 mg total) by mouth daily.   clotrimazole-betamethasone cream Commonly known as: LOTRISONE Apply 1 application topically 2 (two) times daily as needed  (itching).   ferrous sulfate 325 (65 FE) MG tablet Take 1 tablet (325 mg total) by mouth 2 (two) times daily with a meal.   losartan 50 MG tablet Commonly known as: COZAAR Take 50 mg by mouth every morning.   mometasone-formoterol 100-5 MCG/ACT Aero Commonly known as: DULERA Inhale 2 puffs into the lungs 2 (two) times daily as needed for wheezing or shortness of breath.   ondansetron 4 MG disintegrating tablet Commonly known as: Zofran ODT Allow 1-2 tablets to dissolve in your mouth every 8 hours as needed for nausea/vomiting   ondansetron 4 MG tablet Commonly known as: ZOFRAN Take 1 tablet (4 mg total) by mouth every 6 (six) hours as needed for nausea. What changed:   when to take this  reasons to  take this   oxyCODONE 5 MG immediate release tablet Commonly known as: Oxy IR/ROXICODONE Take 1-2 tablets (5-10 mg total) by mouth every 4 (four) hours as needed for moderate pain.       Diagnostic Studies: DG Shoulder Left Port  Result Date: 02/23/2021 CLINICAL DATA:  Status post total shoulder replacement EXAM: LEFT SHOULDER: 2 V COMPARISON:  CT left shoulder November 30, 2020 FINDINGS: Frontal and Y scapular images obtained. There is a total shoulder replacement on the left with prosthetic components well-seated. No acute fracture or dislocation. There is mild osteoarthritic change in the acromioclavicular joint. No erosion. There are overlying skin staples. Visualized left lung clear. Note prior fracture mid left humerus with remodeling. IMPRESSION: Total shoulder replacement on the left with prosthetic components well-seated. No fracture or dislocation. Mild osteoarthritic change in the acromioclavicular joint. Acute postoperative changes noted. Prior fracture with remodeling left proximal to mid humerus. Electronically Signed   By: Lowella Grip III M.D.   On: 02/23/2021 15:56   Disposition: Plan for discharge home today with HHPT.   Follow-up Information    Lattie Corns, PA-C Follow up in 14 day(s).   Specialty: Physician Assistant Why: Electa Sniff information: [place] [id number] [phone]              Signed: Judson Roch PA-C 02/25/2021, 9:16 AM

## 2021-02-25 NOTE — Evaluation (Signed)
Physical Therapy Evaluation Patient Details Name: [name] [name] [name] MRN: [id number] DOB: January 04, 1951 Today's Date: 02/25/2021   History of Present Illness  [name] [name] [name] is a 59yoF who comes to [facility] on 137 with progressive stiffness, weakness, and pain of the left shoulder. Pt has history of similar presentation on Rt shoulder now s/p TSA on Right. Pt underwents rTSA with Dr Roland Rack on 02/23/21. Pt with ABLA on POD Hb in 6s which precluded PT evaluation. PMH: TIA, DM2, HTN, GERD.  Clinical Impression  Pt admitted with above diagnosis. Pt currently with functional limitations due to the deficits listed below (see "PT Problem List"). Upon entry, pt in bed, awake and agreeable to participate. The pt is alert, pleasant, interactive, and able to provide info regarding prior level of function, both in tolerance and independence. Pt reports 10/10 Left shoulder pain. Min-modA for supine to/from sitting EOB, more for scooting up, minA transfers [name] [name] SPC, and minguard for 183ft AMB. Pt very SOB after AMB, also reports swimy headed. Patient's performance this date reveals decreased ability, independence, and tolerance in performing all basic mobility required for performance of activities of daily living, howeevr pt is not far from her baseline. She will have support from family at DC.  Pt requires additional DME, close physical assistance, and cues for safe participate in mobility. Pt will benefit from skilled PT intervention to increase independence and safety with basic mobility in preparation for discharge to the venue listed below.       Follow Up Recommendations Home health PT;Supervision for mobility/OOB;Supervision - Intermittent    Equipment Recommendations  None recommended by PT    Recommendations for Other Services       Precautions / Restrictions Precautions Precautions: Fall;Shoulder Shoulder Interventions: Shoulder sling/immobilizer;At all times;Off for dressing/bathing/exercises Required Braces or  Orthoses: Sling Restrictions LUE Weight Bearing: Non weight bearing      Mobility  Bed Mobility Overal bed mobility: Needs Assistance Bed Mobility: Supine to Sit;Sit to Supine     Supine to sit: Min assist Sit to supine: Mod assist   General bed mobility comments: can use HHA to pull self to sitting; modA of 2 LE back into bed, maxA for sliding toward [facility]    Transfers Overall transfer level: Needs assistance Equipment used: Straight cane Transfers: Sit to/from Stand Sit to Stand: Min assist         General transfer comment: minA lift assist  Ambulation/Gait Ambulation/Gait assistance: Min guard Gait Distance (Feet): 140 Feet Assistive device: Straight cane Gait Pattern/deviations: Shuffle     General Gait Details: quite dyspnic upon termination, SpO2 99-100%, HR 120bpm, full recovery of breathing in 3 minutes  Stairs            Wheelchair Mobility    Modified Rankin (Stroke Patients Only)       Balance Overall balance assessment: Modified Independent;No apparent balance deficits (not formally assessed)                                           Pertinent Vitals/Pain Pain Assessment: 0-10 Pain Score: 10-Worst pain ever Pain Location: Lt shoulder    Home Living Family/patient expects to be discharged to:: Private residence Living Arrangements: Alone Available Help at Discharge: Family;Available 24 hours/day;Available PRN/intermittently (DTR and SON with be helping at DC) Type of Home: Apartment Home Access: Level entry     Home Layout: One level Home Equipment:  Walker - 2 wheels;Cane - single point;Hand held shower head;Other (comment);Walker - 4 wheels Additional Comments: hemi walker    Prior Function Level of Independence: Needs assistance   Gait / Transfers Assistance Needed: has chronic back pain and leg pain which limites AMB at baseline  ADL's / Homemaking Assistance Needed: Pt's son and daughter assist with IADL  tasks. she uses public transportation or family takes her to appointments. Pt uses weekly pill organizer.        Hand Dominance   Dominant Hand: Right    Extremity/Trunk Assessment                Communication      Cognition Arousal/Alertness: Awake/alert Behavior During Therapy: WFL for tasks assessed/performed Overall Cognitive Status: Within Functional Limits for tasks assessed                                        General Comments      Exercises     Assessment/Plan    PT Assessment Patient needs continued PT services  PT Problem List Decreased strength;Decreased mobility;Decreased activity tolerance;Decreased range of motion       PT Treatment Interventions DME instruction;Gait training;Patient/family education;Therapeutic activities;Functional mobility training;Therapeutic exercise    PT Goals (Current goals can be found in the Care Plan section)  Acute Rehab PT Goals Patient Stated Goal: to go home PT Goal Formulation: With patient Time For Goal Achievement: 03/11/21 Potential to Achieve Goals: Good    Frequency 7X/week   Barriers to discharge        Co-evaluation               AM-PAC PT "6 Clicks" Mobility  Outcome Measure Help needed turning from your back to your side while in a flat bed without using bedrails?: Total Help needed moving from lying on your back to sitting on the side of a flat bed without using bedrails?: A Lot Help needed moving to and from a bed to a chair (including a wheelchair)?: A Lot Help needed standing up from a chair using your arms (e.g., wheelchair or bedside chair)?: A Lot Help needed to walk in hospital room?: A Little Help needed climbing 3-5 steps with a railing? : A Lot 6 Click Score: 12    End of Session Equipment Utilized During Treatment: Gait belt Activity Tolerance: Patient tolerated treatment well;Patient limited by fatigue Patient left: in bed;with call bell/phone within  reach;with SCD's reapplied   PT Visit Diagnosis: Difficulty in walking, not elsewhere classified (R26.2);Other abnormalities of gait and mobility (R26.89)    Time: 5784-6962 PT Time Calculation (min) (ACUTE ONLY): 27 min   Charges:   PT Evaluation $PT Eval Moderate Complexity: 1 Mod PT Treatments $Therapeutic Exercise: 8-22 mins        9:02 AM, 02/25/21 Etta Grandchild, PT, DPT Physical Therapist - [facility]  [phone] ([place])    [name] [name] [name] 02/25/2021, 9:00 AM

## 2021-02-25 NOTE — Progress Notes (Signed)
D/c education provided. Pt given hard scripts for oxy, Asprin, and Zofran. IV removed. Pt refusing to take polar care home. Pt has shoulder immobilizer in place. Pt assisted with getting dressed and collecting the rest of her belongings. Pt did have some nausea, PRN meds given at this time, pt still is ready and wants to go to her daughter [place] the medical [place], NT took pt out in [facility].

## 2021-02-26 LAB — TYPE AND SCREEN
ABO/RH(D): A POS
Antibody Screen: NEGATIVE
Unit division: 0
Unit division: 0

## 2021-02-26 LAB — BPAM RBC
Blood Product Expiration Date: 202206132359
Blood Product Expiration Date: 202206132359
ISSUE DATE / TIME: 202205201259
ISSUE DATE / TIME: 202205201611
Unit Type and Rh: 6200
Unit Type and Rh: 6200

## 2021-02-27 ENCOUNTER — Encounter: Payer: Self-pay | Admitting: Surgery

## 2021-02-27 LAB — SURGICAL PATHOLOGY

## 2021-04-07 ENCOUNTER — Ambulatory Visit: Admission: RE | Admit: 2021-04-07 | Payer: Medicare Other | Source: Ambulatory Visit

## 2021-04-28 ENCOUNTER — Encounter
Admission: RE | Admit: 2021-04-28 | Discharge: 2021-04-28 | Disposition: A | Discharge status: Home / Self Care | Payer: Medicare Other | Source: Ambulatory Visit | Attending: Gastroenterology | Admitting: Gastroenterology

## 2021-04-28 ENCOUNTER — Other Ambulatory Visit: Payer: Self-pay

## 2021-04-28 DIAGNOSIS — R6881 Early satiety: Secondary | ICD-10-CM

## 2021-04-28 DIAGNOSIS — R634 Abnormal weight loss: Secondary | ICD-10-CM

## 2021-05-11 ENCOUNTER — Other Ambulatory Visit: Payer: Self-pay

## 2021-05-11 ENCOUNTER — Ambulatory Visit
Admission: RE | Admit: 2021-05-11 | Discharge: 2021-05-11 | Disposition: A | Discharge status: Home / Self Care | Payer: Medicare Other | Source: Ambulatory Visit | Attending: Internal Medicine | Admitting: Internal Medicine

## 2021-05-11 DIAGNOSIS — Z1231 Encounter for screening mammogram for malignant neoplasm of breast: Secondary | ICD-10-CM | POA: Insufficient documentation

## 2021-06-27 ENCOUNTER — Other Ambulatory Visit: Payer: Self-pay

## 2021-06-27 ENCOUNTER — Other Ambulatory Visit: Payer: Self-pay | Admitting: Gastroenterology

## 2021-06-27 ENCOUNTER — Inpatient Hospital Stay
Admission: EM | Admit: 2021-06-27 | Discharge: 2021-06-30 | DRG: 812 | Disposition: A | Discharge status: Home / Self Care | Payer: Medicare Other | Attending: Family Medicine | Admitting: Family Medicine

## 2021-06-27 DIAGNOSIS — Z96611 Presence of right artificial shoulder joint: Secondary | ICD-10-CM | POA: Diagnosis present

## 2021-06-27 DIAGNOSIS — E11649 Type 2 diabetes mellitus with hypoglycemia without coma: Secondary | ICD-10-CM | POA: Diagnosis present

## 2021-06-27 DIAGNOSIS — Z87891 Personal history of nicotine dependence: Secondary | ICD-10-CM | POA: Diagnosis not present

## 2021-06-27 DIAGNOSIS — I129 Hypertensive chronic kidney disease with stage 1 through stage 4 chronic kidney disease, or unspecified chronic kidney disease: Secondary | ICD-10-CM | POA: Diagnosis present

## 2021-06-27 DIAGNOSIS — Z8249 Family history of ischemic heart disease and other diseases of the circulatory system: Secondary | ICD-10-CM

## 2021-06-27 DIAGNOSIS — E785 Hyperlipidemia, unspecified: Secondary | ICD-10-CM | POA: Diagnosis present

## 2021-06-27 DIAGNOSIS — Z79899 Other long term (current) drug therapy: Secondary | ICD-10-CM

## 2021-06-27 DIAGNOSIS — G894 Chronic pain syndrome: Secondary | ICD-10-CM | POA: Diagnosis present

## 2021-06-27 DIAGNOSIS — K2100 Gastro-esophageal reflux disease with esophagitis, without bleeding: Secondary | ICD-10-CM | POA: Diagnosis present

## 2021-06-27 DIAGNOSIS — E1122 Type 2 diabetes mellitus with diabetic chronic kidney disease: Secondary | ICD-10-CM | POA: Diagnosis present

## 2021-06-27 DIAGNOSIS — M810 Age-related osteoporosis without current pathological fracture: Secondary | ICD-10-CM | POA: Diagnosis present

## 2021-06-27 DIAGNOSIS — K449 Diaphragmatic hernia without obstruction or gangrene: Secondary | ICD-10-CM | POA: Diagnosis present

## 2021-06-27 DIAGNOSIS — D649 Anemia, unspecified: Secondary | ICD-10-CM | POA: Diagnosis present

## 2021-06-27 DIAGNOSIS — Z8673 Personal history of transient ischemic attack (TIA), and cerebral infarction without residual deficits: Secondary | ICD-10-CM

## 2021-06-27 DIAGNOSIS — Z7982 Long term (current) use of aspirin: Secondary | ICD-10-CM

## 2021-06-27 DIAGNOSIS — K862 Cyst of pancreas: Secondary | ICD-10-CM

## 2021-06-27 DIAGNOSIS — R42 Dizziness and giddiness: Secondary | ICD-10-CM | POA: Diagnosis present

## 2021-06-27 DIAGNOSIS — N1831 Chronic kidney disease, stage 3a: Secondary | ICD-10-CM | POA: Diagnosis present

## 2021-06-27 DIAGNOSIS — Z823 Family history of stroke: Secondary | ICD-10-CM | POA: Diagnosis not present

## 2021-06-27 DIAGNOSIS — K5730 Diverticulosis of large intestine without perforation or abscess without bleeding: Secondary | ICD-10-CM | POA: Diagnosis present

## 2021-06-27 DIAGNOSIS — J449 Chronic obstructive pulmonary disease, unspecified: Secondary | ICD-10-CM | POA: Diagnosis present

## 2021-06-27 DIAGNOSIS — J8410 Pulmonary fibrosis, unspecified: Secondary | ICD-10-CM | POA: Diagnosis present

## 2021-06-27 DIAGNOSIS — K2950 Unspecified chronic gastritis without bleeding: Secondary | ICD-10-CM | POA: Diagnosis present

## 2021-06-27 DIAGNOSIS — E1129 Type 2 diabetes mellitus with other diabetic kidney complication: Secondary | ICD-10-CM | POA: Diagnosis present

## 2021-06-27 DIAGNOSIS — Z79891 Long term (current) use of opiate analgesic: Secondary | ICD-10-CM

## 2021-06-27 DIAGNOSIS — D124 Benign neoplasm of descending colon: Secondary | ICD-10-CM | POA: Diagnosis present

## 2021-06-27 DIAGNOSIS — I10 Essential (primary) hypertension: Secondary | ICD-10-CM | POA: Diagnosis not present

## 2021-06-27 DIAGNOSIS — I350 Nonrheumatic aortic (valve) stenosis: Secondary | ICD-10-CM | POA: Diagnosis present

## 2021-06-27 DIAGNOSIS — Z20822 Contact with and (suspected) exposure to covid-19: Secondary | ICD-10-CM | POA: Diagnosis present

## 2021-06-27 DIAGNOSIS — D500 Iron deficiency anemia secondary to blood loss (chronic): Principal | ICD-10-CM | POA: Diagnosis present

## 2021-06-27 DIAGNOSIS — Z86010 Personal history of colonic polyps: Secondary | ICD-10-CM

## 2021-06-27 DIAGNOSIS — K648 Other hemorrhoids: Secondary | ICD-10-CM | POA: Diagnosis present

## 2021-06-27 DIAGNOSIS — K219 Gastro-esophageal reflux disease without esophagitis: Secondary | ICD-10-CM | POA: Diagnosis not present

## 2021-06-27 DIAGNOSIS — Z803 Family history of malignant neoplasm of breast: Secondary | ICD-10-CM

## 2021-06-27 DIAGNOSIS — Z96612 Presence of left artificial shoulder joint: Secondary | ICD-10-CM | POA: Diagnosis present

## 2021-06-27 LAB — URINALYSIS, COMPLETE (UACMP) WITH MICROSCOPIC
Bilirubin Urine: NEGATIVE
Glucose, UA: NEGATIVE mg/dL
Hgb urine dipstick: NEGATIVE
Ketones, ur: NEGATIVE mg/dL
Nitrite: NEGATIVE
Protein, ur: NEGATIVE mg/dL
RBC / HPF: NONE SEEN RBC/hpf (ref 0–5)
Specific Gravity, Urine: 1.015 (ref 1.005–1.030)
pH: 6 (ref 5.0–8.0)

## 2021-06-27 LAB — RETICULOCYTES
Immature Retic Fract: 14.2 % (ref 2.3–15.9)
RBC.: 2.64 MIL/uL — ABNORMAL LOW (ref 3.87–5.11)
Retic Count, Absolute: 48 10*3/uL (ref 19.0–186.0)
Retic Ct Pct: 1.8 % (ref 0.4–3.1)

## 2021-06-27 LAB — TROPONIN I (HIGH SENSITIVITY): Troponin I (High Sensitivity): 7 ng/L (ref <18)

## 2021-06-27 LAB — BASIC METABOLIC PANEL
Anion gap: 8 (ref 5–15)
BUN: 31 mg/dL — ABNORMAL HIGH (ref 8–23)
CO2: 23 mmol/L (ref 22–32)
Calcium: 8.6 mg/dL — ABNORMAL LOW (ref 8.9–10.3)
Chloride: 108 mmol/L (ref 98–111)
Creatinine, Ser: 1.05 mg/dL — ABNORMAL HIGH (ref 0.44–1.00)
GFR, Estimated: 58 mL/min — ABNORMAL LOW (ref >60)
Glucose, Bld: 140 mg/dL — ABNORMAL HIGH (ref 70–99)
Potassium: 3.8 mmol/L (ref 3.5–5.1)
Sodium: 139 mmol/L (ref 135–145)

## 2021-06-27 LAB — CBC
HCT: 18 % — ABNORMAL LOW (ref 36.0–46.0)
Hemoglobin: 5.2 g/dL — ABNORMAL LOW (ref 12.0–15.0)
MCH: 19.9 pg — ABNORMAL LOW (ref 26.0–34.0)
MCHC: 28.9 g/dL — ABNORMAL LOW (ref 30.0–36.0)
MCV: 69 fL — ABNORMAL LOW (ref 80.0–100.0)
Platelets: 450 10*3/uL — ABNORMAL HIGH (ref 150–400)
RBC: 2.61 MIL/uL — ABNORMAL LOW (ref 3.87–5.11)
RDW: 16.6 % — ABNORMAL HIGH (ref 11.5–15.5)
WBC: 9.6 10*3/uL (ref 4.0–10.5)
nRBC: 0 % (ref 0.0–0.2)

## 2021-06-27 LAB — PREPARE RBC (CROSSMATCH)

## 2021-06-27 LAB — IRON AND TIBC
Iron: 9 ug/dL — ABNORMAL LOW (ref 28–170)
Saturation Ratios: 2 % — ABNORMAL LOW (ref 10.4–31.8)
TIBC: 514 ug/dL — ABNORMAL HIGH (ref 250–450)
UIBC: 505 ug/dL

## 2021-06-27 LAB — FERRITIN: Ferritin: 6 ng/mL — ABNORMAL LOW (ref 11–307)

## 2021-06-27 LAB — CBG MONITORING, ED: Glucose-Capillary: 84 mg/dL (ref 70–99)

## 2021-06-27 MED ORDER — OXYCODONE HCL 5 MG PO TABS: 5.0000 mg | ORAL_TABLET | Freq: Four times a day (QID) | ORAL | Status: DC | PRN

## 2021-06-27 MED ORDER — PANTOPRAZOLE SODIUM 40 MG IV SOLR
40.0000 mg | Freq: Once | INTRAVENOUS | Status: DC
Start: 2021-06-27 — End: 2021-06-30
  Filled 2021-06-27: qty 40

## 2021-06-27 MED ORDER — LOSARTAN POTASSIUM 50 MG PO TABS
50.0000 mg | ORAL_TABLET | ORAL | Status: DC
  Administered 2021-06-28 – 2021-06-30 (×2): 50 mg via ORAL
  Filled 2021-06-27 (×2): qty 1

## 2021-06-27 MED ORDER — IRON DEXTRAN 50 MG/ML IJ SOLN: 25.0000 mg | Freq: Once | INTRAMUSCULAR | Status: DC

## 2021-06-27 MED ORDER — INSULIN ASPART 100 UNIT/ML IJ SOLN: 0.0000 [IU] | Freq: Every day | INTRAMUSCULAR | Status: DC

## 2021-06-27 MED ORDER — SODIUM CHLORIDE 0.9 % IV SOLN
500.0000 mg | Freq: Once | INTRAVENOUS | Status: AC
  Administered 2021-06-28: 500 mg via INTRAVENOUS
  Filled 2021-06-27: qty 10

## 2021-06-27 MED ORDER — FERROUS SULFATE 325 (65 FE) MG PO TABS
325.0000 mg | ORAL_TABLET | Freq: Two times a day (BID) | ORAL | Status: DC
  Administered 2021-06-28 – 2021-06-30 (×4): 325 mg via ORAL
  Filled 2021-06-27 (×4): qty 1

## 2021-06-27 MED ORDER — SODIUM CHLORIDE 0.9 % IV SOLN
25.0000 mg | Freq: Once | INTRAVENOUS | Status: AC
  Administered 2021-06-28: 25 mg via INTRAVENOUS
  Filled 2021-06-27: qty 0.5

## 2021-06-27 MED ORDER — INSULIN ASPART 100 UNIT/ML IJ SOLN
0.0000 [IU] | Freq: Three times a day (TID) | INTRAMUSCULAR | Status: DC
  Administered 2021-06-29: 1 [IU] via SUBCUTANEOUS
  Filled 2021-06-27: qty 1

## 2021-06-27 MED ORDER — SODIUM CHLORIDE 0.9 % IV SOLN: 10.0000 mL/h | Freq: Once | INTRAVENOUS | Status: DC

## 2021-06-27 NOTE — ED Provider Notes (Signed)
[facility] Emergency Department Provider Note  ____________________________________________   Event Date/Time   First MD Initiated Contact with Patient 06/27/21 1804     (approximate)  I have reviewed the triage vital signs and the nursing notes.   HISTORY  Chief Complaint Hypoglycemia    HPI [name] [name] is a 70 y.o. female with anemia, pancreatic cyst, CKD, diabetes who comes in with concerns for low hemoglobin. Pt reports feeling lightheaded today. That is intermittent, nothing makes it better or worse. Saw her PCP today and got a call saying to come to the ED for low hemoglobin. She denies known history of anemia but had to get 2 units of blood after shoulder surgery in may 2022.  She reports normal stool. No abd pain, NO vaginal bleeding, no blood in urine.  Patient reports being on iron previously but did not take it because it made her dizzy  Patient denies any falls recently.           Past Medical History:  Diagnosis Date   Anemia    Asthma    Chronic airway obstruction (HCC)    Chronic kidney disease    F/U WITH DR LATEEF   Diabetes mellitus without complication (HCC)    Dyspnea    WITH EXERTION   GERD (gastroesophageal reflux disease)    Heart murmur    ASYMPTOMATIC   History of adenomatous polyp of colon    History of bone density study    Hyperlipidemia    Hypertension    Idiopathic peripheral neuropathy    Morbid obesity (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   Normocytic anemia 04/04/2020   Cellulitis 01/03/2020   Cellulitis of right arm 01/02/2020   Sepsis ([place]) 01/02/2020   Asthma 01/02/2020   HTN (hypertension) 01/02/2020   Anemia due to stage 3b  chronic kidney disease ([place]) 12/22/2019   Pedal edema 12/01/2019   Fall 11/18/2019   Closed rib fracture 11/18/2019   Pulmonary fibrosis (HCC)    Asthma exacerbation    Acute renal failure superimposed on stage 3a chronic kidney disease ([place])    Chronic pain syndrome 09/15/2018   AVD (aortic valve disease) 10/07/2017   Benign essential hypertension 06/17/2017   Nonrheumatic aortic valve insufficiency 06/17/2017   CKD (chronic kidney disease), stage IIIa 05/20/2017   Wound dehiscence 11/05/2016   Lumbar stenosis 10/15/2016   Renal impairment 10/05/2016   Right lumbar radiculopathy 10/05/2016   Complete tear of right rotator cuff 11/28/2015   Rotator cuff arthropathy, right 11/28/2015   Low vitamin D level 02/28/2015   Gastroesophageal reflux disease without esophagitis 12/14/2014   Morbid obesity with BMI of 40.0-44.9, adult ([place]) 10/06/2014   Aortic stenosis 09/14/2014   DOE (dyspnea on exertion) 09/08/2014   History of adenomatous polyp of colon 09/08/2014   Type II diabetes mellitus with renal manifestations ([place]) 03/22/2014   Hyperlipidemia 03/22/2014    Past Surgical History:  Procedure Laterality Date   ABDOMINAL HYSTERECTOMY     BACK SURGERY     BLADDER SUSPENSION     CESAREAN SECTION     COLONOSCOPY     COLONOSCOPY WITH PROPOFOL N/A 08/30/2020   Procedure: COLONOSCOPY WITH PROPOFOL;  Surgeon: Lesly Rubenstein, MD;  Location: [facility] ENDOSCOPY;  Service: Endoscopy;  Laterality: N/A;   DILATION AND CURETTAGE OF UTERUS     ESOPHAGOGASTRODUODENOSCOPY     ESOPHAGOGASTRODUODENOSCOPY (EGD) WITH PROPOFOL N/A 08/30/2020   Procedure: ESOPHAGOGASTRODUODENOSCOPY (EGD) WITH PROPOFOL;  Surgeon: Lesly Rubenstein, MD;  Location: [facility] ENDOSCOPY;  Service: Endoscopy;  Laterality: N/A;   LUMBAR LAMINECTOMY/DECOMPRESSION MICRODISCECTOMY N/A 10/15/2016   Procedure: L3-L5 Laminectomy ;  Surgeon: Deetta Perla, MD;  Location: [facility] ORS;  Service: Neurosurgery;  Laterality: N/A;   LUMBAR  WOUND DEBRIDEMENT N/A 11/05/2016   Procedure: LUMBAR WOUND DEBRIDEMENT;  Surgeon: Deetta Perla, MD;  Location: [facility] ORS;  Service: Neurosurgery;  Laterality: N/A;   REVERSE SHOULDER ARTHROPLASTY Right 05/05/2020   Procedure: REVERSE SHOULDER ARTHROPLASTY;  Surgeon: Corky Mull, MD;  Location: [facility] ORS;  Service: Orthopedics;  Laterality: Right;   REVERSE SHOULDER ARTHROPLASTY Left 02/23/2021   Procedure: REVERSE SHOULDER ARTHROPLASTY;  Surgeon: Corky Mull, MD;  Location: [facility] ORS;  Service: Orthopedics;  Laterality: Left;   TUBAL LIGATION      Prior to Admission medications   Medication Sig Start Date End Date Taking? Authorizing Provider  albuterol (VENTOLIN HFA) 108 (90 Base) MCG/ACT inhaler Inhale 2 puffs into the lungs every 6 (six) hours as needed for wheezing or shortness of breath.    [provider]  aspirin EC 325 MG EC tablet Take 1 tablet (325 mg total) by mouth daily. 02/25/21   Lattie Corns, PA-C  clotrimazole-betamethasone (LOTRISONE) cream Apply 1 application topically 2 (two) times daily as needed (itching).  01/28/20   [provider]  ferrous sulfate 325 (65 FE) MG tablet Take 1 tablet (325 mg total) by mouth 2 (two) times daily with a meal. Patient not taking: Reported on 02/10/2021 11/24/19   Nicole Kindred A, DO  losartan (COZAAR) 50 MG tablet Take 50 mg by mouth every morning.     [provider]  mometasone-formoterol (DULERA) 100-5 MCG/ACT AERO Inhale 2 puffs into the lungs 2 (two) times daily as needed for wheezing or shortness of breath.    [provider]  ondansetron (ZOFRAN ODT) 4 MG disintegrating tablet Allow 1-2 tablets to dissolve in your mouth every 8 hours as needed for nausea/vomiting Patient not taking: No sig reported 08/17/20   Hinda Kehr, MD  ondansetron (ZOFRAN) 4 MG tablet Take 1 tablet (4 mg total) by mouth every 6 (six) hours as needed for nausea. 02/25/21   Lattie Corns, PA-C  oxyCODONE (OXY  IR/ROXICODONE) 5 MG immediate release tablet Take 1-2 tablets (5-10 mg total) by mouth every 4 (four) hours as needed for moderate pain. 02/25/21   Lattie Corns, PA-C    Allergies Codeine, Furosemide, Penicillin v potassium, Quinine, Tramadol, and Tylenol [acetaminophen]  Family History  Problem Relation Age of Onset   Stroke Mother    Heart attack Mother    Breast cancer Sister 41   Stroke Sister    Heart attack Sister    Breast cancer Cousin        maternal side 5's    Social History Social History   Tobacco Use   Smoking status: Former    Packs/day: 3.00    Years: 30.00    Pack years: 90.00    Types: Cigarettes    Quit date: 04/28/2000    Years since quitting: 21.1   Smokeless tobacco: Never  Vaping Use   Vaping Use: Never used  Substance Use Topics   Alcohol use: No  Drug use: No      Review of Systems Constitutional: No fever/chills Eyes: No visual changes. ENT: No sore throat. Cardiovascular: Denies chest pain. Lightheaded  Respiratory: Denies shortness of breath. Gastrointestinal: No abdominal pain.  No nausea, no vomiting.  No diarrhea.  No constipation. Genitourinary: Negative for dysuria. Musculoskeletal: Negative for back pain. Skin: Negative for rash. Neurological: Negative for headaches, focal weakness or numbness. All other ROS negative ____________________________________________   PHYSICAL EXAM:  VITAL SIGNS: ED Triage Vitals [06/27/21 1502]  Enc Vitals Group     BP (!) 154/49     Pulse Rate 75     Resp 18     Temp 98.3 F (36.8 C)     Temp Source Oral     SpO2 98 %     Weight 141 lb (64 kg)     Height 4\' 11"  (1.499 m)     Head Circumference      Peak Flow      Pain Score 0     Pain Loc      Pain Edu?      Excl. in [place]?     Constitutional: Alert and oriented. Well appearing and in no acute distress. Eyes: Conjunctivae are normal. EOMI. Head: Atraumatic. Nose: No congestion/rhinnorhea. Mouth/Throat: Mucous membranes  are moist.   Neck: No stridor. Trachea Midline. FROM Cardiovascular: Normal rate, regular rhythm. Grossly normal heart sounds.  Good peripheral circulation. Respiratory: Normal respiratory effort.  No retractions. Lungs CTAB. Gastrointestinal: Soft and nontender. No distention. No abdominal bruits.  Musculoskeletal: No lower extremity tenderness nor edema.  No joint effusions. Neurologic:  Normal speech and language. No gross focal neurologic deficits are appreciated.  Skin:  Skin is warm, dry and intact. No rash noted. Psychiatric: Mood and affect are normal. Speech and behavior are normal. GU: Deferred   ____________________________________________   LABS (all labs ordered are listed, but only abnormal results are displayed)  Labs Reviewed  BASIC METABOLIC PANEL - Abnormal; Notable for the following components:      Result Value   Glucose, Bld 140 (*)    BUN 31 (*)    Creatinine, Ser 1.05 (*)    Calcium 8.6 (*)    GFR, Estimated 58 (*)    All other components within normal limits  CBC - Abnormal; Notable for the following components:   RBC 2.61 (*)    Hemoglobin 5.2 (*)    HCT 18.0 (*)    MCV 69.0 (*)    MCH 19.9 (*)    MCHC 28.9 (*)    RDW 16.6 (*)    Platelets 450 (*)    All other components within normal limits  URINALYSIS, COMPLETE (UACMP) WITH MICROSCOPIC  CBG MONITORING, ED  CBG MONITORING, ED  TROPONIN I (HIGH SENSITIVITY)   ____________________________________________   ED ECG REPORT I, Vanessa [place], the attending physician, personally viewed and interpreted this ECG.  Normal sinus rate of 86, no ST elevations but T wave inversions in 3 aVF with an incomplete right bundle branch block  ____________________________________________     PROCEDURES  Procedure(s) performed (including Critical Care):  .Critical Care Performed by: Vanessa [place], MD Authorized by: Vanessa [place], MD   Critical care provider statement:    Critical care time (minutes):   45   Critical care was necessary to treat or prevent imminent or life-threatening deterioration of the following conditions: anemia requiring blood transfusions.   Critical care was time spent personally by me on the following activities:  Discussions with consultants, evaluation of patient's response to treatment, examination of patient, ordering and performing treatments and interventions, ordering and review of laboratory studies, ordering and review of radiographic studies, pulse oximetry, re-evaluation of patient's condition, obtaining history from patient or surrogate and review of old charts   ____________________________________________   INITIAL IMPRESSION / ASSESSMENT AND PLAN / ED COURSE  [name] [name] was evaluated in Emergency Department on 06/27/2021 for the symptoms described in the history of present illness. She was evaluated in the context of the global COVID-19 pandemic, which necessitated consideration that the patient might be at risk for infection with the SARS-CoV-2 virus that causes COVID-19. Institutional protocols and algorithms that pertain to the evaluation of patients at risk for COVID-19 are in a state of rapid change based on information released by regulatory bodies including the CDC and federal and state organizations. These policies and algorithms were followed during the patient's care in the ED.    Patient comes in with concerns for low hemoglobin.  Possibly secondary to iron deficiency versus GI bleed.  Patient denying black stools to suggest melena.  Patient given a PPI injection.  Unfortunate patient is in the hallway and I cannot do a rectal exam at this time.  We will have patient collect stool for sample.  She denies any bleeds to suggest retroperitoneal hemorrhage.  No other bleeding from other sources.  We will discussed with the hospital team for admission for work-up of her anemia and transfused 2 units of blood due to her symptomatic anemia        ____________________________________________   FINAL CLINICAL IMPRESSION(S) / ED DIAGNOSES   Final diagnoses:  Symptomatic anemia      MEDICATIONS GIVEN DURING THIS VISIT:  Medications  pantoprazole (PROTONIX) injection 40 mg (has no administration in time range)  0.9 %  sodium chloride infusion (has no administration in time range)     ED Discharge Orders     None        Note:  This document was prepared using Dragon voice recognition software and may include unintentional dictation errors.    Vanessa [place], MD 06/27/21 [phone]

## 2021-06-27 NOTE — ED Notes (Signed)
Type and Screen with BBID stickers and form sent to lab at this time. Purple and light green sent to lab at this time.

## 2021-06-27 NOTE — ED Notes (Signed)
BBID [id number] applied to this pt's R wrist above pt ID band

## 2021-06-27 NOTE — ED Triage Notes (Signed)
Pt reports she was at home and the doctors called her and told her that her blood sugar was 5.3  It currently is 83. She is feeling dizzy at this time.

## 2021-06-27 NOTE — H&P (Signed)
History and Physical    [name] [name] HUD:[id number] DOB: 10-12-1950 DOA: 06/27/2021  Referring MD/NP/PA:   PCP: Tracie Harrier, MD   Patient coming from:  The patient is coming from home.  At baseline, pt is independent for most of ADL.        Chief Complaint: dizziness and low hemoglobin level  HPI: [name] [name] is a 70 y.o. female with medical history significant of HTN, T2DM, GERD, iron deficiency anemia, asthma, chronic pain, presented to the ED with dizziness and low hemoglobin level.   Pt was seen by GI today found Hb low and sent to ED. Pt reports intermittent dizziness, especially when moving around or getting up.  She denies fever, chills, cough, chest pain, SOB, abdominal pain, nausea, vomiting, diarrhea or black stool.  She denies bleeding anywhere. She had EGD and colonoscopy in Nov 2021 due to dyspepsia, dysphagia, nausea with vomiting and weight loss. Found esophageal mucosal variant, small hiatal hernia and gastritis duodenitis, internal hemorrhoids, diverticula, 2 mm sessile polyp removed. Follow up endoscopy scheduled in December. Pt has not been taking iron supplement recently.  She denies taking NSAIDs. Her Hb was found to be 6.1 in May when she had shoulder surgery. Hb up to 9.6 after 2 units blood transfusion. Hb was 7.9 on 8/4 per care everywhere.   ED Course: pt was found to have Hb 5.2, Iron 9, TIBC 514, saturation ratio 2, ferritin 6, reticulocyte 14.2%. FOBT not done due to not able to do rectal exam while patient in hallway and no examining room available.   Review of Systems:   General: no fevers, chills, + dizziness HEENT: no blurry vision, hearing changes or sore throat Respiratory: no dyspnea, coughing, wheezing CV: no chest pain, no palpitations GI: no nausea, vomiting, abdominal pain, diarrhea, constipation GU: no dysuria, burning on urination, increased urinary frequency, hematuria  Ext: no leg edema Neuro: no unilateral weakness, numbness, or  tingling, no vision change or hearing loss Skin: no rash, no skin tear. MSK: No muscle spasm, no deformity, no limitation of range of movement in spin Heme: No easy bruising.  Travel history: No recent long distant travel.  Allergy:  Allergies  Allergen Reactions   Codeine Nausea And Vomiting   Furosemide Other (See Comments)   Penicillin V Potassium     Other reaction(s): Unknown   Quinine     Other reaction(s): Unknown   Tramadol     Other reaction(s): Unknown   Tylenol [Acetaminophen]     Tylenol-Codeine: Dizziness    Past Medical History:  Diagnosis Date   Anemia    Asthma    Chronic airway obstruction (HCC)    Chronic kidney disease    F/U WITH DR LATEEF   Diabetes mellitus without complication (HCC)    Dyspnea    WITH EXERTION   GERD (gastroesophageal reflux disease)    Heart murmur    ASYMPTOMATIC   History of adenomatous polyp of colon    History of bone density study    Hyperlipidemia    Hypertension    Idiopathic peripheral neuropathy    Morbid obesity (HCC)    Osteoporosis    Plantar fascial fibromatosis    Pulmonary fibrosis (HCC)    TIA (transient ischemic attack)     Past Surgical History:  Procedure Laterality Date   ABDOMINAL HYSTERECTOMY     BACK SURGERY     BLADDER SUSPENSION     CESAREAN SECTION     COLONOSCOPY  COLONOSCOPY WITH PROPOFOL N/A 08/30/2020   Procedure: COLONOSCOPY WITH PROPOFOL;  Surgeon: Lesly Rubenstein, MD;  Location: [facility] ENDOSCOPY;  Service: Endoscopy;  Laterality: N/A;   DILATION AND CURETTAGE OF UTERUS     ESOPHAGOGASTRODUODENOSCOPY     ESOPHAGOGASTRODUODENOSCOPY (EGD) WITH PROPOFOL N/A 08/30/2020   Procedure: ESOPHAGOGASTRODUODENOSCOPY (EGD) WITH PROPOFOL;  Surgeon: Lesly Rubenstein, MD;  Location: [facility] ENDOSCOPY;  Service: Endoscopy;  Laterality: N/A;   LUMBAR LAMINECTOMY/DECOMPRESSION MICRODISCECTOMY N/A 10/15/2016   Procedure: L3-L5 Laminectomy ;  Surgeon: Deetta Perla, MD;  Location: [facility] ORS;  Service:  Neurosurgery;  Laterality: N/A;   LUMBAR WOUND DEBRIDEMENT N/A 11/05/2016   Procedure: LUMBAR WOUND DEBRIDEMENT;  Surgeon: Deetta Perla, MD;  Location: [facility] ORS;  Service: Neurosurgery;  Laterality: N/A;   REVERSE SHOULDER ARTHROPLASTY Right 05/05/2020   Procedure: REVERSE SHOULDER ARTHROPLASTY;  Surgeon: Corky Mull, MD;  Location: [facility] ORS;  Service: Orthopedics;  Laterality: Right;   REVERSE SHOULDER ARTHROPLASTY Left 02/23/2021   Procedure: REVERSE SHOULDER ARTHROPLASTY;  Surgeon: Corky Mull, MD;  Location: [facility] ORS;  Service: Orthopedics;  Laterality: Left;   TUBAL LIGATION      Social History:  reports that she quit smoking about 21 years ago. Her smoking use included cigarettes. She has a 90.00 pack-year smoking history. She has never used smokeless tobacco. She reports that she does not drink alcohol and does not use drugs.  Family History:  Family History  Problem Relation Age of Onset   Stroke Mother    Heart attack Mother    Breast cancer Sister 63   Stroke Sister    Heart attack Sister    Breast cancer Cousin        maternal side 80's     Prior to Admission medications   Medication Sig Start Date End Date Taking? Authorizing Provider  albuterol (VENTOLIN HFA) 108 (90 Base) MCG/ACT inhaler Inhale 2 puffs into the lungs every 6 (six) hours as needed for wheezing or shortness of breath.   Yes [provider]  clotrimazole-betamethasone (LOTRISONE) cream Apply 1 application topically 2 (two) times daily as needed (itching).  01/28/20  Yes [provider]  hydrocortisone 2.5 % cream Apply 1 application topically 2 (two) times daily. (Apply to face as spot treatment)   Yes [provider]  iron polysaccharides (NIFEREX) 150 MG capsule Take 150 mg by mouth daily.   Yes [provider]  ketoconazole (NIZORAL) 2 % cream Apply 1 application topically 2 (two) times daily. (Apply to areas on face)   Yes [provider]  losartan (COZAAR) 50  MG tablet Take 50 mg by mouth every morning.    Yes [provider]  mometasone-formoterol (DULERA) 100-5 MCG/ACT AERO Inhale 2 puffs into the lungs 2 (two) times daily as needed for wheezing or shortness of breath.   Yes [provider]  oxyCODONE (OXY IR/ROXICODONE) 5 MG immediate release tablet Take 1-2 tablets (5-10 mg total) by mouth every 4 (four) hours as needed for moderate pain. Patient taking differently: Take 5 mg by mouth 2 (two) times daily as needed for moderate pain or severe pain. 02/25/21  Yes Lattie Corns, PA-C  aspirin EC 325 MG EC tablet Take 1 tablet (325 mg total) by mouth daily. Patient not taking: No sig reported 02/25/21   Lattie Corns, PA-C  ferrous sulfate 325 (65 FE) MG tablet Take 1 tablet (325 mg total) by mouth 2 (two) times daily with a meal. Patient not taking: No sig reported 11/24/19  Nicole Kindred A, DO  ondansetron (ZOFRAN ODT) 4 MG disintegrating tablet Allow 1-2 tablets to dissolve in your mouth every 8 hours as needed for nausea/vomiting Patient not taking: No sig reported 08/17/20   Hinda Kehr, MD  ondansetron (ZOFRAN) 4 MG tablet Take 1 tablet (4 mg total) by mouth every 6 (six) hours as needed for nausea. Patient not taking: No sig reported 02/25/21   Lattie Corns, PA-C    Physical Exam: Vitals:   06/27/21 1502 06/27/21 1916  BP: (!) 154/49 (!) 117/44  Pulse: 75 85  Resp: 18 18  Temp: 98.3 F (36.8 C)   TempSrc: Oral   SpO2: 98% (!) 82%  Weight: 64 kg   Height: 4\' 11"  (1.499 m)    General: Not in acute distress HEENT:       Eyes: PERRL, EOMI, no scleral icterus.       ENT: No discharge from the ears and nose,        Neck: No JVD, no bruit, no mass felt. Heme: No neck lymph node enlargement. Cardiac: S1/S2, RRR, No murmurs, No gallops or rubs. Respiratory: Good air movement bilaterally. No rales, wheezing, rhonchi or rubs. GI: Soft, nondistended, nontender, no rebound pain, no organomegaly, BS  present. GU: No hematuria Ext: No pitting leg edema bilaterally. 2+DP/PT pulse bilaterally. Musculoskeletal: No joint deformities, No joint redness or warmth, no limitation of ROM in spin. Skin: No rashes.  Neuro: Alert, oriented X3, cranial nerves II-XII grossly intact, no FND. Psych: Patient is not psychotic, no suicidal or hemocidal ideation.  Labs on Admission: I have personally reviewed following labs and imaging studies  CBC: Recent Labs  Lab 06/27/21 1507  WBC 9.6  HGB 5.2*  HCT 18.0*  MCV 69.0*  PLT 423*   Basic Metabolic Panel: Recent Labs  Lab 06/27/21 1507  NA 139  K 3.8  CL 108  CO2 23  GLUCOSE 140*  BUN 31*  CREATININE 1.05*  CALCIUM 8.6*   GFR: Estimated Creatinine Clearance: 41.7 mL/min (A) (by C-G formula based on SCr of 1.05 mg/dL (H)). Liver Function Tests: No results for input(s): AST, ALT, ALKPHOS, BILITOT, PROT, ALBUMIN in the last 168 hours. No results for input(s): LIPASE, AMYLASE in the last 168 hours. No results for input(s): AMMONIA in the last 168 hours. Coagulation Profile: No results for input(s): INR, PROTIME in the last 168 hours. Cardiac Enzymes: No results for input(s): CKTOTAL, CKMB, CKMBINDEX, TROPONINI in the last 168 hours. BNP (last 3 results) No results for input(s): PROBNP in the last 8760 hours. HbA1C: No results for input(s): HGBA1C in the last 72 hours. CBG: Recent Labs  Lab 06/27/21 1501  GLUCAP 84   Lipid Profile: No results for input(s): CHOL, HDL, LDLCALC, TRIG, CHOLHDL, LDLDIRECT in the last 72 hours. Thyroid Function Tests: No results for input(s): TSH, T4TOTAL, FREET4, T3FREE, THYROIDAB in the last 72 hours. Anemia Panel: Recent Labs    06/27/21 1849  FERRITIN 6*  TIBC 514*  IRON 9*  RETICCTPCT 1.8   Urine analysis:    Component Value Date/Time   COLORURINE YELLOW 06/27/2021 1824   APPEARANCEUR CLEAR 06/27/2021 1824   LABSPEC 1.015 06/27/2021 1824   PHURINE 6.0 06/27/2021 1824   GLUCOSEU  NEGATIVE 06/27/2021 1824   HGBUR NEGATIVE 06/27/2021 1824   BILIRUBINUR NEGATIVE 06/27/2021 1824   KETONESUR NEGATIVE 06/27/2021 1824   PROTEINUR NEGATIVE 06/27/2021 1824   NITRITE NEGATIVE 06/27/2021 1824   LEUKOCYTESUR SMALL (A) 06/27/2021 1824   Sepsis Labs: @LABRCNTIP (procalcitonin:4,lacticidven:4) )No  results found for this or any previous visit (from the past 240 hour(s)).   Radiological Exams on Admission: No results found.   Assessment/Plan Principal Problem:   Symptomatic anemia Active Problems:   Aortic stenosis   Benign essential hypertension   Chronic pain syndrome   Type II diabetes mellitus with renal manifestations (HCC)   Gastroesophageal reflux disease without esophagitis   History of adenomatous polyp of colon   HTN (hypertension)  [name] [name] is a 70 y.o. female with medical history significant of HTN, T2DM, GERD, iron deficiency anemia, asthma, chronic pain, presented with dizziness and low hemoglobin level.   Symptomatic anemia Iron deficiency anemia -- Hb 5.2 -- Iron 9, TIBC 514, saturation ratio 2, ferritin 6, reticulocyte 14.2%.  -- possible GIB, likely chronic: Hb 6.1 in May,  up to 9.6 after 2 units blood transfusion. down to 7.9 on 8/4  -- FOBT not done due to not able to do rectal exam while patient in hallway and no examining room available. FOBT ordered.  -- EGD and colonoscopy in Nov 2021: esophageal mucosal variant, small hiatal hernia and gastritis duodenitis, internal hemorrhoids, diverticula, 2 mm sessile polyp removed.  -- denies taking NSAIDs.  -- 2 units pRBC ordered in ED -- consider iron IV tomorrow -- GI consult  Benign essential hypertension --c/w losartan    Chronic pain syndrome --on as needed oxycodone    Type II diabetes mellitus  -- Monitor glucose and SSI  Asthma -- no exacerbation  DVT ppx: SCD Code Status: Full code Family Communication: None at bed side.          Disposition Plan:  Anticipate discharge back  to previous home environment Consults called:   Admission status: tele  Inpatient/tele         Date of Service 06/27/2021    [place] [facility]   If 7PM-7AM, please contact night-coverage [email or web address] Password [facility] 06/27/2021, 8:13 PM

## 2021-06-27 NOTE — ED Notes (Signed)
Received call from [facility] to clarify that patient was sent to ED for evaluation of symptomatic anemia, not hypoglycemia.  Blood work already drawn and sent.  Will continue to monitor.

## 2021-06-27 NOTE — ED Notes (Signed)
Pt does not wish to have another IV placed at this time.

## 2021-06-28 ENCOUNTER — Encounter: Payer: Self-pay | Admitting: Internal Medicine

## 2021-06-28 DIAGNOSIS — D649 Anemia, unspecified: Secondary | ICD-10-CM

## 2021-06-28 DIAGNOSIS — I10 Essential (primary) hypertension: Secondary | ICD-10-CM

## 2021-06-28 LAB — BASIC METABOLIC PANEL
Anion gap: 7 (ref 5–15)
BUN: 26 mg/dL — ABNORMAL HIGH (ref 8–23)
CO2: 24 mmol/L (ref 22–32)
Calcium: 8.6 mg/dL — ABNORMAL LOW (ref 8.9–10.3)
Chloride: 109 mmol/L (ref 98–111)
Creatinine, Ser: 0.82 mg/dL (ref 0.44–1.00)
GFR, Estimated: >60 mL/min (ref >60)
Glucose, Bld: 81 mg/dL (ref 70–99)
Potassium: 3.9 mmol/L (ref 3.5–5.1)
Sodium: 140 mmol/L (ref 135–145)

## 2021-06-28 LAB — SURGICAL PCR SCREEN
MRSA, PCR: NEGATIVE
Staphylococcus aureus: NEGATIVE

## 2021-06-28 LAB — IRON AND TIBC
Iron: 244 ug/dL — ABNORMAL HIGH (ref 28–170)
Saturation Ratios: 47 % — ABNORMAL HIGH (ref 10.4–31.8)
TIBC: 517 ug/dL — ABNORMAL HIGH (ref 250–450)
UIBC: 273 ug/dL

## 2021-06-28 LAB — CBC
HCT: 23.7 % — ABNORMAL LOW (ref 36.0–46.0)
Hemoglobin: 7.4 g/dL — ABNORMAL LOW (ref 12.0–15.0)
MCH: 23.4 pg — ABNORMAL LOW (ref 26.0–34.0)
MCHC: 31.2 g/dL (ref 30.0–36.0)
MCV: 75 fL — ABNORMAL LOW (ref 80.0–100.0)
Platelets: 384 10*3/uL (ref 150–400)
RBC: 3.16 MIL/uL — ABNORMAL LOW (ref 3.87–5.11)
RDW: 20.3 % — ABNORMAL HIGH (ref 11.5–15.5)
WBC: 8.6 10*3/uL (ref 4.0–10.5)
nRBC: 0 % (ref 0.0–0.2)

## 2021-06-28 LAB — GLUCOSE, CAPILLARY
Glucose-Capillary: 103 mg/dL — ABNORMAL HIGH (ref 70–99)
Glucose-Capillary: 105 mg/dL — ABNORMAL HIGH (ref 70–99)

## 2021-06-28 LAB — RETICULOCYTES
Immature Retic Fract: 19.3 % — ABNORMAL HIGH (ref 2.3–15.9)
RBC.: 3.15 MIL/uL — ABNORMAL LOW (ref 3.87–5.11)
Retic Count, Absolute: 31.2 10*3/uL (ref 19.0–186.0)
Retic Ct Pct: 1 % (ref 0.4–3.1)

## 2021-06-28 LAB — CBG MONITORING, ED
Glucose-Capillary: 85 mg/dL (ref 70–99)
Glucose-Capillary: 84 mg/dL (ref 70–99)

## 2021-06-28 LAB — FOLATE: Folate: 19.5 ng/mL (ref >5.9)

## 2021-06-28 LAB — HIV ANTIBODY (ROUTINE TESTING W REFLEX): HIV Screen 4th Generation wRfx: NONREACTIVE

## 2021-06-28 LAB — VITAMIN B12: Vitamin B-12: 514 pg/mL (ref 180–914)

## 2021-06-28 LAB — SARS CORONAVIRUS 2 (TAT 6-24 HRS): SARS Coronavirus 2: NEGATIVE

## 2021-06-28 LAB — FERRITIN: Ferritin: 6 ng/mL — ABNORMAL LOW (ref 11–307)

## 2021-06-28 MED ORDER — ONDANSETRON 4 MG PO TBDP
4.0000 mg | ORAL_TABLET | Freq: Once | ORAL | Status: AC | PRN
  Administered 2021-06-28: 4 mg via ORAL
  Filled 2021-06-28: qty 1

## 2021-06-28 MED ORDER — ONDANSETRON HCL 4 MG/2ML IJ SOLN: 4.0000 mg | Freq: Once | INTRAMUSCULAR | Status: DC | PRN

## 2021-06-28 MED ORDER — SODIUM CHLORIDE 0.9 % IV SOLN: INTRAVENOUS | Status: DC

## 2021-06-28 MED ORDER — BISACODYL 5 MG PO TBEC
20.0000 mg | DELAYED_RELEASE_TABLET | Freq: Once | ORAL | Status: AC
  Administered 2021-06-28: 20 mg via ORAL
  Filled 2021-06-28: qty 4

## 2021-06-28 MED ORDER — MUPIROCIN 2 % EX OINT
1.0000 "application " | TOPICAL_OINTMENT | Freq: Two times a day (BID) | CUTANEOUS | Status: DC
  Administered 2021-06-28: 1 via NASAL
  Filled 2021-06-28: qty 22

## 2021-06-28 MED ORDER — POLYETHYLENE GLYCOL 3350 17 GM/SCOOP PO POWD
1.0000 | Freq: Once | ORAL | Status: AC
  Administered 2021-06-28: 20:00:00 255 g via ORAL
  Filled 2021-06-28: qty 255

## 2021-06-28 NOTE — ED Notes (Signed)
Per pt had covid swab last night, called lab to verify, per Sonia Baller  swab en route to [facility]

## 2021-06-28 NOTE — Progress Notes (Addendum)
PROGRESS NOTE  [name] [name]    DOB: January 03, 1951, 70 y.o.  FYB:[id number]  PCP: Tracie Harrier, MD   Code Status: Full Code   DOA: 06/27/2021   LOS: 1  Brief Narrative of Current Hospitalization  [name] [name] is a 70 y.o. female with a PMH significant for HTN, type 2 diabetes, GERD, IDA, asthma, chronic pain. They presented from home to the ED on 06/27/2021 with dizziness and orthostatic symptoms for several days . In the ED, it was found that they had hemoglobin of 5.2. They were treated with 2 units packed red blood cells.  Patient was admitted to medicine service for further workup and management of symptomatic anemia as outlined in detail below.  06/28/21 -GI consulted and to intervene  Assessment & Plan  Principal Problem:   Symptomatic anemia Active Problems:   Aortic stenosis   Benign essential hypertension   Chronic pain syndrome   Type II diabetes mellitus with renal manifestations (HCC)   Gastroesophageal reflux disease without esophagitis   History of adenomatous polyp of colon   HTN (hypertension)  Symptomatic IDA- hgb 5.2 on admission. S/p 2 units pRBCs. Hgb 7.4 today. GI has been consulted. No active signs of bleeding. - keeping NPO. Possible bowel prep.  - f/u GI - CBC am - IV iron -Maintenance IV fluids until taking p.o.  Nausea-patient kept n.p.o. for possible GI intervention and is experiencing nausea and vomiting.  IV Zofran was given and resolved symptoms -Zofran as needed -Put in clear liquid diet if no procedure today  HTN- well controlled. Home meds include- losartan -Continue home meds  CKDIII- normal Cr this morning.  -   T2DM- diet controlled - CBGs with labs  HLD-no home statin therapy and no allergies in chart -Consider starting statin with PCP  Pulmonary fibrosis  Asthma- mild, intermittent - continue home dulera  Chronic pain-Home meds include Oxy IR as needed -Continue home as needed Oxy IR  DVT prophylaxis: SCDs Start: 06/27/21  1930   Diet:  Diet Orders (From admission, onward)     Start     Ordered   06/28/21 0001  Diet NPO time specified Except for: Sips with Meds  Diet effective midnight       Question:  Except for  Answer:  Sips with Meds   06/27/21 1936            Subjective 06/28/21    Pt reports nausea and vomiting has resolved with Zofran.  She feels very hungry.  Denies any blood in her vomit.  She had a bowel movement a few minutes before we spoke which was a little bit runny and did not look like it contained blood.  Disposition Plan & Communication  Status is: Inpatient  Remains inpatient appropriate because:Hemodynamically unstable  Dispo: The patient is from: Home              Anticipated d/c is to: Home              Patient currently is not medically stable to d/c.   Difficult to place patient No  Family Communication: N/A  Consults, Procedures, Significant Events  Consultants:  GI  Procedures/significant events:  Potentially endoscopy today or tomorrow  Antimicrobials:  Anti-infectives (From admission, onward)    None        Objective   Vitals:   06/28/21 0044 06/28/21 0130 06/28/21 0302 06/28/21 0448  BP: (!) 128/50 124/65 (!) 108/58 126/73  Pulse: 89 82 86 83  Resp:  18 17 15 15   Temp: 98.1 F (36.7 C) 98.1 F (36.7 C)    TempSrc: Oral Oral    SpO2: 94% 94% 100% 100%  Weight:      Height:        Intake/Output Summary (Last 24 hours) at 06/28/2021 0844 Last data filed at 06/28/2021 0410 Gross per 24 hour  Intake 759.5 ml  Output --  Net 759.5 ml   Filed Weights   06/27/21 1502  Weight: 64 kg    Patient BMI: Body mass index is 28.48 kg/m.   Physical Exam: General: awake, alert, NAD HEENT: atraumatic, clear conjunctiva, anicteric sclera, moist mucus membranes, hearing grossly normal Respiratory:  normal respiratory effort. Cardiovascular: normal S1/S2,  RRR, no JVD, murmurs, rubs, gallops, quick capillary refill  Gastrointestinal: soft, NT, ND, no  HSM felt, +bowel sounds. Nervous: A&O x4. no gross focal neurologic deficits, normal speech Extremities: moves all equally, no edema, normal tone Skin: dry, intact, normal temperature, normal color, No rashes, lesions or ulcers Psychiatry: normal mood, congruent affect, judgement and insight appear normal  Labs   I have personally reviewed following labs and imaging studies Admission on 06/27/2021  Component Date Value Ref Range Status   Glucose-Capillary 06/27/2021 84  70 - 99 mg/dL Final   Sodium 06/27/2021 139  135 - 145 mmol/L Final   Potassium 06/27/2021 3.8  3.5 - 5.1 mmol/L Final   Chloride 06/27/2021 108  98 - 111 mmol/L Final   CO2 06/27/2021 23  22 - 32 mmol/L Final   Glucose, Bld 06/27/2021 140 (A) 70 - 99 mg/dL Final   BUN 06/27/2021 31 (A) 8 - 23 mg/dL Final   Creatinine, Ser 06/27/2021 1.05 (A) 0.44 - 1.00 mg/dL Final   Calcium 06/27/2021 8.6 (A) 8.9 - 10.3 mg/dL Final   GFR, Estimated 06/27/2021 58 (A) >60 mL/min Final   Anion gap 06/27/2021 8  5 - 15 Final   WBC 06/27/2021 9.6  4.0 - 10.5 K/uL Final   RBC 06/27/2021 2.61 (A) 3.87 - 5.11 MIL/uL Final   Hemoglobin 06/27/2021 5.2 (A) 12.0 - 15.0 g/dL Final   HCT 06/27/2021 18.0 (A) 36.0 - 46.0 % Final   MCV 06/27/2021 69.0 (A) 80.0 - 100.0 fL Final   MCH 06/27/2021 19.9 (A) 26.0 - 34.0 pg Final   MCHC 06/27/2021 28.9 (A) 30.0 - 36.0 g/dL Final   RDW 06/27/2021 16.6 (A) 11.5 - 15.5 % Final   Platelets 06/27/2021 450 (A) 150 - 400 K/uL Final   nRBC 06/27/2021 0.0  0.0 - 0.2 % Final   Color, Urine 06/27/2021 YELLOW  YELLOW Final   APPearance 06/27/2021 CLEAR  CLEAR Final   Specific Gravity, Urine 06/27/2021 1.015  1.005 - 1.030 Final   pH 06/27/2021 6.0  5.0 - 8.0 Final   Glucose, UA 06/27/2021 NEGATIVE  NEGATIVE mg/dL Final   Hgb urine dipstick 06/27/2021 NEGATIVE  NEGATIVE Final   Bilirubin Urine 06/27/2021 NEGATIVE  NEGATIVE Final   Ketones, ur 06/27/2021 NEGATIVE  NEGATIVE mg/dL Final   Protein, ur 06/27/2021  NEGATIVE  NEGATIVE mg/dL Final   Nitrite 06/27/2021 NEGATIVE  NEGATIVE Final   Leukocytes,Ua 06/27/2021 SMALL (A) NEGATIVE Final   Squamous Epithelial / LPF 06/27/2021 0-5  0 - 5 Final   WBC, UA 06/27/2021 0-5  0 - 5 WBC/hpf Final   RBC / HPF 06/27/2021 NONE SEEN  0 - 5 RBC/hpf Final   Bacteria, UA 06/27/2021 RARE (A) NONE SEEN Final   Mucus 06/27/2021 PRESENT  Final   Glucose-Capillary 06/28/2021 85  70 - 99 mg/dL Final   Troponin I (High Sensitivity) 06/27/2021 7  <18 ng/L Final   Iron 06/27/2021 9 (A) 28 - 170 ug/dL Final   TIBC 06/27/2021 514 (A) 250 - 450 ug/dL Final   Saturation Ratios 06/27/2021 2 (A) 10.4 - 31.8 % Final   UIBC 06/27/2021 505  ug/dL Final   Retic Ct Pct 06/27/2021 1.8  0.4 - 3.1 % Final   RBC. 06/27/2021 2.64 (A) 3.87 - 5.11 MIL/uL Final   Retic Count, Absolute 06/27/2021 48.0  19.0 - 186.0 K/uL Final   Immature Retic Fract 06/27/2021 14.2  2.3 - 15.9 % Final   Ferritin 06/27/2021 6 (A) 11 - 307 ng/mL Final   Order Confirmation 06/27/2021    Final                   Value:ORDER PROCESSED BY BLOOD BANK Performed at [facility], [address] [place], [place] [address]    ABO/RH(D) 06/27/2021 A POS   Final   Antibody Screen 06/27/2021 NEG   Final   Sample Expiration 06/27/2021 06/30/2021,2359   Final   Unit Number 06/27/2021 [id number]   Final   Blood Component Type 06/27/2021 RED CELLS,LR   Final   Unit division 06/27/2021 00   Final   Status of Unit 06/27/2021 ISSUED   Final   Transfusion Status 06/27/2021 OK TO TRANSFUSE   Final   Crossmatch Result 06/27/2021 Compatible   Final   Unit Number 06/27/2021 [id number]   Final   Blood Component Type 06/27/2021 RBC LR PHER1   Final   Unit division 06/27/2021 00   Final   Status of Unit 06/27/2021 ISSUED   Final   Transfusion Status 06/27/2021 OK TO TRANSFUSE   Final   Crossmatch Result 06/27/2021    Final                   Value:Compatible Performed at [facility], [address] [place], [place] [address]    Sodium 06/28/2021 140  135 - 145 mmol/L Final   Potassium 06/28/2021 3.9  3.5 - 5.1 mmol/L Final   Chloride 06/28/2021 109  98 - 111 mmol/L Final   CO2 06/28/2021 24  22 - 32 mmol/L Final   Glucose, Bld 06/28/2021 81  70 - 99 mg/dL Final   BUN 06/28/2021 26 (A) 8 - 23 mg/dL Final   Creatinine, Ser 06/28/2021 0.82  0.44 - 1.00 mg/dL Final   Calcium 06/28/2021 8.6 (A) 8.9 - 10.3 mg/dL Final   GFR, Estimated 06/28/2021 >60  >60 mL/min Final   Anion gap 06/28/2021 7  5 - 15 Final   WBC 06/28/2021 8.6  4.0 - 10.5 K/uL Final   RBC 06/28/2021 3.16 (A) 3.87 - 5.11 MIL/uL Final   Hemoglobin 06/28/2021 7.4 (A) 12.0 - 15.0 g/dL Final   HCT 06/28/2021 23.7 (A) 36.0 - 46.0 % Final   MCV 06/28/2021 75.0 (A) 80.0 - 100.0 fL Final   MCH 06/28/2021 23.4 (A) 26.0 - 34.0 pg Final   MCHC 06/28/2021 31.2  30.0 - 36.0 g/dL Final   RDW 06/28/2021 20.3 (A) 11.5 - 15.5 % Final   Platelets 06/28/2021 384  150 - 400 K/uL Final   nRBC 06/28/2021 0.0  0.0 - 0.2 % Final   ISSUE DATE / TIME 06/27/2021 [id number]   Final   Blood Product Unit Number 06/27/2021 [id number]   Final   PRODUCT CODE 06/27/2021 [id number]  Final   Unit Type and Rh 06/27/2021 0600   Final   Blood Product Expiration Date 06/27/2021 [id number]   Final   ISSUE DATE / TIME 06/27/2021 [id number]   Final   Blood Product Unit Number 06/27/2021 [id number]   Final   PRODUCT CODE 06/27/2021 [id number]   Final   Unit Type and Rh 06/27/2021 0600   Final   Blood Product Expiration Date 06/27/2021 [id number]   Final   Folate 06/28/2021 19.5  >5.9 ng/mL Final   Iron 06/28/2021 244 (A) 28 - 170 ug/dL Final   TIBC 06/28/2021 517 (A) 250 - 450 ug/dL Final   Saturation Ratios 06/28/2021 47 (A) 10.4 - 31.8 % Final   UIBC 06/28/2021 273  ug/dL Final   Ferritin 06/28/2021 6 (A) 11 - 307 ng/mL Final   Retic Ct Pct 06/28/2021 1.0  0.4 - 3.1 % Final   RBC. 06/28/2021 3.15 (A) 3.87 - 5.11 MIL/uL Final   Retic  Count, Absolute 06/28/2021 31.2  19.0 - 186.0 K/uL Final   Immature Retic Fract 06/28/2021 19.3 (A) 2.3 - 15.9 % Final   Glucose-Capillary 06/28/2021 84  70 - 99 mg/dL Final     No results found for this or any previous visit (from the past 240 hour(s)).   Imaging Studies  No results found. Medications   Scheduled Meds:  ferrous sulfate  325 mg Oral BID WC   insulin aspart  0-5 Units Subcutaneous QHS   insulin aspart  0-9 Units Subcutaneous TID WC   losartan  50 mg Oral [id number]   pantoprazole (PROTONIX) IV  40 mg Intravenous Once   Continuous Infusions:  sodium chloride     iron dextran (INFED/DEXFERRUM) infusion     Followed by   iron dextran (INFED/DEXFERRUM) infusion       LOS: 1 day   Time spent: >80min   Richarda Osmond, DO [facility] 06/28/2021, 8:44 AM   To contact the [facility] Attending or Consulting provider for this patient: Check the care team in [facility] for a) attending/consulting [facility] provider listed and b) the [facility] team listed Log into [email or web address] and use [place]'s universal password to access. If you do not have the password, please contact the hospital operator. Locate the [facility] provider you are looking for under [facility] and page to a number that you can be directly reached. If you still have difficulty reaching the provider, please page the [facility] (Director on Call) for the Hospitalists listed on amion for assistance.

## 2021-06-28 NOTE — Consult Note (Signed)
GI Inpatient Consult Note  Reason for Consult: Symptomatic anemia      Attending Requesting Consult: Dr. Doristine Mango, MD  History of Present Illness: [name] [name] is a 70 y.o. female seen for evaluation of symptomatic anemia at the request of hospitalist - Dr. Doristine Mango. Pt has a PMH of HTN, GERD, T2DM, IDA, asthma, chronic pain syndrome, morbid obesity, pulmonary fibrosis, and CKD.  She presented to the [facility] ED yesterday afternoon for chief complaint of symptomatic anemia with dizziness and fatigue. She was seen as a follow-up at our office as an outpatient by Stephens November, NP for anemia where blood work showed hemoglobin 5.3 and was advised to present to the ED. Upon presentation to the ED, she was found to have severe iron-deficiency anemia with hemoglobin 5.2, ferritin 6, iron 9, TIBC 512, and iron sat 14%. Reticulocyte count was 14.2%. Digital rectal exam was deferred since patient was in the hallway and no room available but there was no overt gastrointestinal blood loss. She was transfused 2 units pRBCs with improvements of hemoglobin to 7.4. She also received IV iron infusion this afternoon. She denies any recent changes to her bowel habits. She has not noticed any hematochezia or melena. She denies vaginal bleeding, epistaxis, or hematemesis. She has been dealing with anemia for the last several months. She had hemoglobin 6.1 in May 2022 when she had shoulder surgery and required transfusion. Hemoglobin was 7.9 six weeks ago per labs in [place] and was 9.2 two months ago. She did have EGD/colonoscopy performed electively by Dr. Haig Prophet which showed suboptimal bowel preparation with semi-solid stool in ascending colon, one 2 mm polyp removed from ascending colon, pandiverticulosis. EGD showed reflux esophagitis, chronic gastritis, and duodenitis. A 1-year repeat colonoscopy was advised given suboptimal bowel prep. She denies any complaints of abdominal pain. She was having  issues with dyspepsia and nausea last year and that is why the EGD was done, but reports these symptoms have resolved. Her only complaint currently is fatigue and feeling tired all the time. She denies frequent use of NSAIDs. She takes Tylenol as needed for headaches. She does not take oral iron.    Summary of Last GI Procedures:  EGD 08/30/20 - esophageal mucosal variant with biopsies showing reflux esophagitis negative for intestinal metaplasia, small hiatal hernia, chronic gastritis, duodenitis  CSY 08/30/20 - semi-solid stool found in ascending colon precluding visualization, one 2 mm TA in ascending colon, pandiverticulosis. TI was not able to be intubated after multiple attempts.     Past Medical History:  Past Medical History:  Diagnosis Date   Anemia    Asthma    Chronic airway obstruction (HCC)    Chronic kidney disease    F/U WITH DR LATEEF   Diabetes mellitus without complication (HCC)    Dyspnea    WITH EXERTION   GERD (gastroesophageal reflux disease)    Heart murmur    ASYMPTOMATIC   History of adenomatous polyp of colon    History of bone density study    Hyperlipidemia    Hypertension    Idiopathic peripheral neuropathy    Morbid obesity (HCC)    Osteoporosis    Plantar fascial fibromatosis    Pulmonary fibrosis (HCC)    TIA (transient ischemic attack)     Problem List: Patient Active Problem List   Diagnosis Date Noted   Symptomatic anemia 06/27/2021   Status post reverse arthroplasty of shoulder, left 02/23/2021   Status post reverse total shoulder replacement, right  05/05/2020   Normocytic anemia 04/04/2020   Cellulitis 01/03/2020   Cellulitis of right arm 01/02/2020   Sepsis ([place]) 01/02/2020   Asthma 01/02/2020   HTN (hypertension) 01/02/2020   Anemia due to stage 3b chronic kidney disease ([place]) 12/22/2019   Pedal edema 12/01/2019   Fall 11/18/2019   Closed rib fracture 11/18/2019   Pulmonary fibrosis (HCC)    Asthma exacerbation    Acute  renal failure superimposed on stage 3a chronic kidney disease ([place])    Chronic pain syndrome 09/15/2018   AVD (aortic valve disease) 10/07/2017   Benign essential hypertension 06/17/2017   Nonrheumatic aortic valve insufficiency 06/17/2017   CKD (chronic kidney disease), stage IIIa 05/20/2017   Wound dehiscence 11/05/2016   Lumbar stenosis 10/15/2016   Renal impairment 10/05/2016   Right lumbar radiculopathy 10/05/2016   Complete tear of right rotator cuff 11/28/2015   Rotator cuff arthropathy, right 11/28/2015   Low vitamin D level 02/28/2015   Gastroesophageal reflux disease without esophagitis 12/14/2014   Morbid obesity with BMI of 40.0-44.9, adult ([place]) 10/06/2014   Aortic stenosis 09/14/2014   DOE (dyspnea on exertion) 09/08/2014   History of adenomatous polyp of colon 09/08/2014   Type II diabetes mellitus with renal manifestations ([place]) 03/22/2014   Hyperlipidemia 03/22/2014    Past Surgical History: Past Surgical History:  Procedure Laterality Date   ABDOMINAL HYSTERECTOMY     BACK SURGERY     BLADDER SUSPENSION     CESAREAN SECTION     COLONOSCOPY     COLONOSCOPY WITH PROPOFOL N/A 08/30/2020   Procedure: COLONOSCOPY WITH PROPOFOL;  Surgeon: Lesly Rubenstein, MD;  Location: [facility];  Service: Endoscopy;  Laterality: N/A;   DILATION AND CURETTAGE OF UTERUS     ESOPHAGOGASTRODUODENOSCOPY     ESOPHAGOGASTRODUODENOSCOPY (EGD) WITH PROPOFOL N/A 08/30/2020   Procedure: ESOPHAGOGASTRODUODENOSCOPY (EGD) WITH PROPOFOL;  Surgeon: Lesly Rubenstein, MD;  Location: [facility] ENDOSCOPY;  Service: Endoscopy;  Laterality: N/A;   LUMBAR LAMINECTOMY/DECOMPRESSION MICRODISCECTOMY N/A 10/15/2016   Procedure: L3-L5 Laminectomy ;  Surgeon: Deetta Perla, MD;  Location: [facility] ORS;  Service: Neurosurgery;  Laterality: N/A;   LUMBAR WOUND DEBRIDEMENT N/A 11/05/2016   Procedure: LUMBAR WOUND DEBRIDEMENT;  Surgeon: Deetta Perla, MD;  Location: [facility] ORS;  Service: Neurosurgery;  Laterality: N/A;    REVERSE SHOULDER ARTHROPLASTY Right 05/05/2020   Procedure: REVERSE SHOULDER ARTHROPLASTY;  Surgeon: Corky Mull, MD;  Location: [facility] OR[facility];  Service: Orthopedics;  Laterality: Right;   REVERSE SHOULDER ARTHROPLASTY Left 02/23/2021   Procedure: REVERSE SHOULDER ARTHROPLASTY;  Surgeon: Corky Mull, MD;  Location: [facility] OR[facility];  Service: Orthopedics;  Laterality: Left;   TUBAL LIGATION      Allergies: Allergies  Allergen Reactions   Codeine Nausea And Vomiting   Furosemide Other (See Comments)   Penicillin V Potassium     Other reaction(s): Unknown   Quinine     Other reaction(s): Unknown   Tramadol     Other reaction(s): Unknown   Tylenol [Acetaminophen]     Tylenol-Codeine: Dizziness    Home Medications: Medications Prior to Admission  Medication Sig Dispense Refill Last Dose   albuterol (VENTOLIN HFA) 108 (90 Base) MCG/ACT inhaler Inhale 2 puffs into the lungs every 6 (six) hours as needed for wheezing or shortness of breath.   Unknown at PRN   clotrimazole-betamethasone (LOTRISONE) cream Apply 1 application topically 2 (two) times daily as needed (itching).    Unknown at PRN   hydrocortisone 2.5 % cream Apply 1 application topically 2 (two)  times daily. (Apply to face as spot treatment)      iron polysaccharides (NIFEREX) 150 MG capsule Take 150 mg by mouth daily.   Unknown at Unknown   ketoconazole (NIZORAL) 2 % cream Apply 1 application topically 2 (two) times daily. (Apply to areas on face)      losartan (COZAAR) 50 MG tablet Take 50 mg by mouth every morning.    06/26/2021 at 0800   mometasone-formoterol (DULERA) 100-5 MCG/ACT AERO Inhale 2 puffs into the lungs 2 (two) times daily as needed for wheezing or shortness of breath.   Unknown at PRN   oxyCODONE (OXY IR/ROXICODONE) 5 MG immediate release tablet Take 1-2 tablets (5-10 mg total) by mouth every 4 (four) hours as needed for moderate pain. (Patient taking differently: Take 5 mg by mouth 2 (two) times daily as needed for  moderate pain or severe pain.) 60 tablet 0 Past Week at PRN   aspirin EC 325 MG EC tablet Take 1 tablet (325 mg total) by mouth daily. (Patient not taking: No sig reported) 30 tablet 0 Not Taking   ferrous sulfate 325 (65 FE) MG tablet Take 1 tablet (325 mg total) by mouth 2 (two) times daily with a meal. (Patient not taking: No sig reported) 60 tablet 1 Not Taking   Home medication reconciliation was completed with the patient.   Scheduled Inpatient Medications:    bisacodyl  20 mg Oral Once   ferrous sulfate  325 mg Oral BID WC   insulin aspart  0-9 Units Subcutaneous TID WC   losartan  50 mg Oral [id number]   pantoprazole (PROTONIX) IV  40 mg Intravenous Once   polyethylene glycol powder  1 Container Oral Once    Continuous Inpatient Infusions:    sodium chloride     sodium chloride      PRN Inpatient Medications:  ondansetron (ZOFRAN) IV, oxyCODONE  Family History: family history includes Breast cancer in her cousin; Breast cancer (age of onset: 70) in her sister; Heart attack in her mother and sister; Stroke in her mother and sister.  The patient's family history is negative for inflammatory bowel disorders, GI malignancy, or solid organ transplantation.  Social History:   reports that she quit smoking about 21 years ago. Her smoking use included cigarettes. She has a 90.00 pack-year smoking history. She has never used smokeless tobacco. She reports that she does not drink alcohol and does not use drugs. The patient denies ETOH, tobacco, or drug use.   Review of Systems: Constitutional: Weight is stable.  Eyes: No changes in vision. ENT: No oral lesions, sore throat.  GI: see HPI.  Heme/Lymph: No easy bruising.  CV: No chest pain.  GU: No hematuria.  Integumentary: No rashes.  Neuro: No headaches.  Psych: No depression/anxiety.  Endocrine: No heat/cold intolerance.  Allergic/Immunologic: No urticaria.  Resp: No cough, SOB.  Musculoskeletal: No joint swelling.     Physical Examination: BP (!) 149/72 (BP Location: Left Wrist)   Pulse 83   Temp 98.2 F (36.8 C) (Oral)   Resp 20   Ht 4\' 11"  (1.499 m)   Wt 64 kg   SpO2 100%   BMI 28.48 kg/m  Non-toxic appearing female on ED stretcher. Daughter at side. No accessory muscle use.  Gen: NAD, alert and oriented x 4 HEENT: PEERLA, EOMI, Neck: supple, no JVD or thyromegaly Chest: CTA bilaterally, no wheezes, crackles, or other adventitious sounds CV: RRR, no m/g/c/r Abd: soft, NT, ND, +BS in all four quadrants;  no HSM, guarding, ridigity, or rebound tenderness Ext: no edema, well perfused with 2+ pulses, Skin: no rash or lesions noted Lymph: no LAD  Data: Lab Results  Component Value Date   WBC 8.6 06/28/2021   HGB 7.4 (L) 06/28/2021   HCT 23.7 (L) 06/28/2021   MCV 75.0 (L) 06/28/2021   PLT 384 06/28/2021   Recent Labs  Lab 06/27/21 1507 06/28/21 0441  HGB 5.2* 7.4*   Lab Results  Component Value Date   NA 140 06/28/2021   K 3.9 06/28/2021   CL 109 06/28/2021   CO2 24 06/28/2021   BUN 26 (H) 06/28/2021   CREATININE 0.82 06/28/2021   Lab Results  Component Value Date   ALT 9 02/14/2021   AST 17 02/14/2021   ALKPHOS 72 02/14/2021   BILITOT 0.5 02/14/2021   No results for input(s): APTT, INR, PTT in the last 168 hours. Assessment/Plan:  70 y/o AA female with a PMH of HTN, GERD, T2DM, IDA, asthma, chronic pain syndrome, morbid obesity, pulmonary fibrosis, and CKD admitted for symptomatic anemia and concerns for chronic GIB  Symptomatic anemia Severe iron-deficiency anemia Nausea - chronic in nature, likely functional versus gastroparesis   - DDx of symptomatic anemia without any gross gastrointestinal bleeding includes occult GI neoplasm, peptic ulcer disease, gastritis, duodenitis, erosive esophagitis, AVMs, or non-GI causes such as MDS syndrome, nutritional deficiencies, etc - She is s/p 2 units of pRBCs and IV iron infusion today with improvements of hemoglobin to 7.4  4.  CKD Stage III - stable  5. Type II DM - diet-controlled  6. Chronic pain syndrome - on chronic narcotics   Recommendations:  - Hemodynamically stable with no overt gastrointestinal blood loss - Continue to monitor serial H&H. Transfuse for Hgb <7.0.  - Avoid NSAIDs - Advise repeat EGD and colonoscopy for luminal evaluation to rule out any chronic GI blood or structural disease. Discussed procedure indications and details with patient and daughter and they are agreeable to proceed.  - Plan for EGD and colonoscopy tomorrow with Dr. Alice Reichert - Clear liquid diet today. NPO after midnight. Bowel prep orders placed.  - If EGD And colonoscopy are nondiagnostic, can consider capsule endoscopy as an outpatient +/- hematology referral   I reviewed the risks (including bleeding, perforation, infection, anesthesia complications, cardiac/respiratory complications), benefits and alternatives of EGD and colonoscopy. Patient consents to proceed.    Thank you for the consult. Please call with questions or concerns.  [facility] [phone] (Cell)

## 2021-06-28 NOTE — H&P (View-Only) (Signed)
GI Inpatient Consult Note  Reason for Consult: Symptomatic anemia      Attending Requesting Consult: Dr. Doristine Mango, MD  History of Present Illness: [name] [name] is a 70 y.o. female seen for evaluation of symptomatic anemia at the request of hospitalist - Dr. Doristine Mango. Pt has a PMH of HTN, GERD, T2DM, IDA, asthma, chronic pain syndrome, morbid obesity, pulmonary fibrosis, and CKD.  She presented to the [facility] ED yesterday afternoon for chief complaint of symptomatic anemia with dizziness and fatigue. She was seen as a follow-up at our office as an outpatient by [name] November, NP for anemia where blood work showed hemoglobin 5.3 and was advised to present to the ED. Upon presentation to the ED, she was found to have severe iron-deficiency anemia with hemoglobin 5.2, ferritin 6, iron 9, TIBC 512, and iron sat 14%. Reticulocyte count was 14.2%. Digital rectal exam was deferred since patient was in the hallway and no room available but there was no overt gastrointestinal blood loss. She was transfused 2 units pRBCs with improvements of hemoglobin to 7.4. She also received IV iron infusion this afternoon. She denies any recent changes to her bowel habits. She has not noticed any hematochezia or melena. She denies vaginal bleeding, epistaxis, or hematemesis. She has been dealing with anemia for the last several months. She had hemoglobin 6.1 in May 2022 when she had shoulder surgery and required transfusion. Hemoglobin was 7.9 six weeks ago per labs in [place] and was 9.2 two months ago. She did have EGD/colonoscopy performed electively by Dr. Haig [name] which showed suboptimal bowel preparation with semi-solid stool in ascending colon, one 2 mm polyp removed from ascending colon, pandiverticulosis. EGD showed reflux esophagitis, chronic gastritis, and duodenitis. A 1-year repeat colonoscopy was advised given suboptimal bowel prep. She denies any complaints of abdominal pain. She was having  issues with dyspepsia and nausea last year and that is why the EGD was done, but reports these symptoms have resolved. Her only complaint currently is fatigue and feeling tired all the time. She denies frequent use of NSAIDs. She takes Tylenol as needed for headaches. She does not take oral iron.    Summary of Last GI Procedures:  EGD 08/30/20 - esophageal mucosal variant with biopsies showing reflux esophagitis negative for intestinal metaplasia, small hiatal hernia, chronic gastritis, duodenitis  CSY 08/30/20 - semi-solid stool found in ascending colon precluding visualization, one 2 mm TA in ascending colon, pandiverticulosis. TI was not able to be intubated after multiple attempts.     Past Medical History:  Past Medical History:  Diagnosis Date   Anemia    Asthma    Chronic airway obstruction (HCC)    Chronic kidney disease    F/U WITH DR LATEEF   Diabetes mellitus without complication (HCC)    Dyspnea    WITH EXERTION   GERD (gastroesophageal reflux disease)    Heart murmur    ASYMPTOMATIC   History of adenomatous polyp of colon    History of bone density study    Hyperlipidemia    Hypertension    Idiopathic peripheral neuropathy    Morbid obesity (HCC)    Osteoporosis    Plantar fascial fibromatosis    Pulmonary fibrosis (HCC)    TIA (transient ischemic attack)     Problem [name]: Patient Active Problem [name]   Diagnosis Date Noted   Symptomatic anemia 06/27/2021   Status post reverse arthroplasty of shoulder, left 02/23/2021   Status post reverse total shoulder replacement, right  05/05/2020   Normocytic anemia 04/04/2020   Cellulitis 01/03/2020   Cellulitis of right arm 01/02/2020   Sepsis ([place]) 01/02/2020   Asthma 01/02/2020   HTN (hypertension) 01/02/2020   Anemia due to stage 3b chronic kidney disease ([place]) 12/22/2019   Pedal edema 12/01/2019   Fall 11/18/2019   Closed rib fracture 11/18/2019   Pulmonary fibrosis (HCC)    Asthma exacerbation    Acute  renal failure superimposed on stage 3a chronic kidney disease ([place])    Chronic pain syndrome 09/15/2018   AVD (aortic valve disease) 10/07/2017   Benign essential hypertension 06/17/2017   Nonrheumatic aortic valve insufficiency 06/17/2017   CKD (chronic kidney disease), stage IIIa 05/20/2017   Wound dehiscence 11/05/2016   Lumbar stenosis 10/15/2016   Renal impairment 10/05/2016   Right lumbar radiculopathy 10/05/2016   Complete tear of right rotator cuff 11/28/2015   Rotator cuff arthropathy, right 11/28/2015   Low vitamin D level 02/28/2015   Gastroesophageal reflux disease without esophagitis 12/14/2014   Morbid obesity with BMI of 40.0-44.9, adult ([place]) 10/06/2014   Aortic stenosis 09/14/2014   DOE (dyspnea on exertion) 09/08/2014   History of adenomatous polyp of colon 09/08/2014   Type II diabetes mellitus with renal manifestations ([place]) 03/22/2014   Hyperlipidemia 03/22/2014    Past Surgical History: Past Surgical History:  Procedure Laterality Date   ABDOMINAL HYSTERECTOMY     BACK SURGERY     BLADDER SUSPENSION     CESAREAN SECTION     COLONOSCOPY     COLONOSCOPY WITH PROPOFOL N/A 08/30/2020   Procedure: COLONOSCOPY WITH PROPOFOL;  Surgeon: Lesly Rubenstein, MD;  Location: [facility] ENDOSCOPY;  Service: Endoscopy;  Laterality: N/A;   DILATION AND CURETTAGE OF UTERUS     ESOPHAGOGASTRODUODENOSCOPY     ESOPHAGOGASTRODUODENOSCOPY (EGD) WITH PROPOFOL N/A 08/30/2020   Procedure: ESOPHAGOGASTRODUODENOSCOPY (EGD) WITH PROPOFOL;  Surgeon: Lesly Rubenstein, MD;  Location: [facility] ENDOSCOPY;  Service: Endoscopy;  Laterality: N/A;   LUMBAR LAMINECTOMY/DECOMPRESSION MICRODISCECTOMY N/A 10/15/2016   Procedure: L3-L5 Laminectomy ;  Surgeon: Deetta Perla, MD;  Location: [facility] ORS;  Service: Neurosurgery;  Laterality: N/A;   LUMBAR WOUND DEBRIDEMENT N/A 11/05/2016   Procedure: LUMBAR WOUND DEBRIDEMENT;  Surgeon: Deetta Perla, MD;  Location: [facility] ORS;  Service: Neurosurgery;  Laterality: N/A;    REVERSE SHOULDER ARTHROPLASTY Right 05/05/2020   Procedure: REVERSE SHOULDER ARTHROPLASTY;  Surgeon: Corky Mull, MD;  Location: [facility] ORS;  Service: Orthopedics;  Laterality: Right;   REVERSE SHOULDER ARTHROPLASTY Left 02/23/2021   Procedure: REVERSE SHOULDER ARTHROPLASTY;  Surgeon: Corky Mull, MD;  Location: [facility] ORS;  Service: Orthopedics;  Laterality: Left;   TUBAL LIGATION      Allergies: Allergies  Allergen Reactions   Codeine Nausea And Vomiting   Furosemide Other (See Comments)   Penicillin V Potassium     Other reaction(s): Unknown   Quinine     Other reaction(s): Unknown   Tramadol     Other reaction(s): Unknown   Tylenol [Acetaminophen]     Tylenol-Codeine: Dizziness    Home Medications: Medications Prior to Admission  Medication Sig Dispense Refill Last Dose   albuterol (VENTOLIN HFA) 108 (90 Base) MCG/ACT inhaler Inhale 2 puffs into the lungs every 6 (six) hours as needed for wheezing or shortness of breath.   Unknown at PRN   clotrimazole-betamethasone (LOTRISONE) cream Apply 1 application topically 2 (two) times daily as needed (itching).    Unknown at PRN   hydrocortisone 2.5 % cream Apply 1 application topically 2 (two)  times daily. (Apply to face as spot treatment)      iron polysaccharides (NIFEREX) 150 MG capsule Take 150 mg by mouth daily.   Unknown at Unknown   ketoconazole (NIZORAL) 2 % cream Apply 1 application topically 2 (two) times daily. (Apply to areas on face)      losartan (COZAAR) 50 MG tablet Take 50 mg by mouth every morning.    06/26/2021 at 0800   mometasone-formoterol (DULERA) 100-5 MCG/ACT AERO Inhale 2 puffs into the lungs 2 (two) times daily as needed for wheezing or shortness of breath.   Unknown at PRN   oxyCODONE (OXY IR/ROXICODONE) 5 MG immediate release tablet Take 1-2 tablets (5-10 mg total) by mouth every 4 (four) hours as needed for moderate pain. (Patient taking differently: Take 5 mg by mouth 2 (two) times daily as needed for  moderate pain or severe pain.) 60 tablet 0 Past Week at PRN   aspirin EC 325 MG EC tablet Take 1 tablet (325 mg total) by mouth daily. (Patient not taking: No sig reported) 30 tablet 0 Not Taking   ferrous sulfate 325 (65 FE) MG tablet Take 1 tablet (325 mg total) by mouth 2 (two) times daily with a meal. (Patient not taking: No sig reported) 60 tablet 1 Not Taking   Home medication reconciliation was completed with the patient.   Scheduled Inpatient Medications:    bisacodyl  20 mg Oral Once   ferrous sulfate  325 mg Oral BID WC   insulin aspart  0-9 Units Subcutaneous TID WC   losartan  50 mg Oral BH-q7a   pantoprazole (PROTONIX) IV  40 mg Intravenous Once   polyethylene glycol powder  1 Container Oral Once    Continuous Inpatient Infusions:    sodium chloride     sodium chloride      PRN Inpatient Medications:  ondansetron (ZOFRAN) IV, oxyCODONE  Family History: family history includes Breast cancer in her cousin; Breast cancer (age of onset: 62) in her sister; Heart attack in her mother and sister; Stroke in her mother and sister.  The patient's family history is negative for inflammatory bowel disorders, GI malignancy, or solid organ transplantation.  Social History:   reports that she quit smoking about 21 years ago. Her smoking use included cigarettes. She has a 90.00 pack-year smoking history. She has never used smokeless tobacco. She reports that she does not drink alcohol and does not use drugs. The patient denies ETOH, tobacco, or drug use.   Review of Systems: Constitutional: Weight is stable.  Eyes: No changes in vision. ENT: No oral lesions, sore throat.  GI: see HPI.  Heme/Lymph: No easy bruising.  CV: No chest pain.  GU: No hematuria.  Integumentary: No rashes.  Neuro: No headaches.  Psych: No depression/anxiety.  Endocrine: No heat/cold intolerance.  Allergic/Immunologic: No urticaria.  Resp: No cough, SOB.  Musculoskeletal: No joint swelling.     Physical Examination: BP (!) 149/72 (BP Location: Left Wrist)   Pulse 83   Temp 98.2 F (36.8 C) (Oral)   Resp 20   Ht 4\' 11"  (1.499 m)   Wt 64 kg   SpO2 100%   BMI 28.48 kg/m  Non-toxic appearing female on ED stretcher. Daughter at side. No accessory muscle use.  Gen: NAD, alert and oriented x 4 HEENT: PEERLA, EOMI, Neck: supple, no JVD or thyromegaly Chest: CTA bilaterally, no wheezes, crackles, or other adventitious sounds CV: RRR, no m/g/c/r Abd: soft, NT, ND, +BS in all four quadrants;  no HSM, guarding, ridigity, or rebound tenderness Ext: no edema, well perfused with 2+ pulses, Skin: no rash or lesions noted Lymph: no LAD  Data: Lab Results  Component Value Date   WBC 8.6 06/28/2021   HGB 7.4 (L) 06/28/2021   HCT 23.7 (L) 06/28/2021   MCV 75.0 (L) 06/28/2021   PLT 384 06/28/2021   Recent Labs  Lab 06/27/21 1507 06/28/21 0441  HGB 5.2* 7.4*   Lab Results  Component Value Date   NA 140 06/28/2021   K 3.9 06/28/2021   CL 109 06/28/2021   CO2 24 06/28/2021   BUN 26 (H) 06/28/2021   CREATININE 0.82 06/28/2021   Lab Results  Component Value Date   ALT 9 02/14/2021   AST 17 02/14/2021   ALKPHOS 72 02/14/2021   BILITOT 0.5 02/14/2021   No results for input(s): APTT, INR, PTT in the last 168 hours. Assessment/Plan:  70 y/o AA female with a PMH of HTN, GERD, T2DM, IDA, asthma, chronic pain syndrome, morbid obesity, pulmonary fibrosis, and CKD admitted for symptomatic anemia and concerns for chronic GIB  Symptomatic anemia Severe iron-deficiency anemia Nausea - chronic in nature, likely functional versus gastroparesis   - DDx of symptomatic anemia without any gross gastrointestinal bleeding includes occult GI neoplasm, peptic ulcer disease, gastritis, duodenitis, erosive esophagitis, AVMs, or non-GI causes such as MDS syndrome, nutritional deficiencies, etc - She is s/p 2 units of pRBCs and IV iron infusion today with improvements of hemoglobin to 7.4  4.  CKD Stage III - stable  5. Type II DM - diet-controlled  6. Chronic pain syndrome - on chronic narcotics   Recommendations:  - Hemodynamically stable with no overt gastrointestinal blood loss - Continue to monitor serial H&H. Transfuse for Hgb <7.0.  - Avoid NSAIDs - Advise repeat EGD and colonoscopy for luminal evaluation to rule out any chronic GI blood or structural disease. Discussed procedure indications and details with patient and daughter and they are agreeable to proceed.  - Plan for EGD and colonoscopy tomorrow with Dr. Alice Reichert - Clear liquid diet today. NPO after midnight. Bowel prep orders placed.  - If EGD And colonoscopy are nondiagnostic, can consider capsule endoscopy as an outpatient +/- hematology referral   I reviewed the risks (including bleeding, perforation, infection, anesthesia complications, cardiac/respiratory complications), benefits and alternatives of EGD and colonoscopy. Patient consents to proceed.    Thank you for the consult. Please call with questions or concerns.  Reeves Forth [facility] [phone] (Cell)

## 2021-06-28 NOTE — ED Notes (Signed)
Patient reports feeling sick and states she needs to eat soon. Patient requesting doctor to speak with her. MD paged.

## 2021-06-29 ENCOUNTER — Inpatient Hospital Stay: Payer: Medicare Other | Admitting: Anesthesiology

## 2021-06-29 ENCOUNTER — Encounter
Admission: EM | Disposition: A | Discharge status: Home / Self Care | Payer: Self-pay | Source: Home / Self Care | Attending: Internal Medicine

## 2021-06-29 DIAGNOSIS — K219 Gastro-esophageal reflux disease without esophagitis: Secondary | ICD-10-CM

## 2021-06-29 DIAGNOSIS — E1122 Type 2 diabetes mellitus with diabetic chronic kidney disease: Secondary | ICD-10-CM

## 2021-06-29 HISTORY — PX: COLONOSCOPY: SHX5424

## 2021-06-29 HISTORY — PX: ESOPHAGOGASTRODUODENOSCOPY: SHX5428

## 2021-06-29 LAB — BPAM RBC
Blood Product Expiration Date: 202209232359
Blood Product Expiration Date: 202209232359
ISSUE DATE / TIME: 202209202039
ISSUE DATE / TIME: 202209210103
Unit Type and Rh: 600
Unit Type and Rh: 600

## 2021-06-29 LAB — TYPE AND SCREEN
ABO/RH(D): A POS
Antibody Screen: NEGATIVE
Unit division: 0
Unit division: 0

## 2021-06-29 LAB — GLUCOSE, CAPILLARY
Glucose-Capillary: 82 mg/dL (ref 70–99)
Glucose-Capillary: 75 mg/dL (ref 70–99)
Glucose-Capillary: 43 mg/dL — CL (ref 70–99)
Glucose-Capillary: 131 mg/dL — ABNORMAL HIGH (ref 70–99)
Glucose-Capillary: 45 mg/dL — ABNORMAL LOW (ref 70–99)
Glucose-Capillary: 71 mg/dL (ref 70–99)
Glucose-Capillary: 39 mg/dL — CL (ref 70–99)
Glucose-Capillary: 96 mg/dL (ref 70–99)

## 2021-06-29 LAB — CBC
HCT: 23.6 % — ABNORMAL LOW (ref 36.0–46.0)
Hemoglobin: 7.3 g/dL — ABNORMAL LOW (ref 12.0–15.0)
MCH: 23.5 pg — ABNORMAL LOW (ref 26.0–34.0)
MCHC: 30.9 g/dL (ref 30.0–36.0)
MCV: 76.1 fL — ABNORMAL LOW (ref 80.0–100.0)
Platelets: 345 10*3/uL (ref 150–400)
RBC: 3.1 MIL/uL — ABNORMAL LOW (ref 3.87–5.11)
RDW: 20.1 % — ABNORMAL HIGH (ref 11.5–15.5)
WBC: 9.7 10*3/uL (ref 4.0–10.5)
nRBC: 0 % (ref 0.0–0.2)

## 2021-06-29 LAB — HEMOGLOBIN A1C
Hgb A1c MFr Bld: 5.6 % (ref 4.8–5.6)
Mean Plasma Glucose: 114 mg/dL

## 2021-06-29 SURGERY — EGD (ESOPHAGOGASTRODUODENOSCOPY)
Anesthesia: General

## 2021-06-29 MED ORDER — PROPOFOL 10 MG/ML IV BOLUS
INTRAVENOUS | Status: DC | PRN
  Administered 2021-06-29 (×2): 10 mg via INTRAVENOUS
  Administered 2021-06-29: 40 mg via INTRAVENOUS
  Administered 2021-06-29: 10 mg via INTRAVENOUS

## 2021-06-29 MED ORDER — LIDOCAINE HCL (CARDIAC) PF 100 MG/5ML IV SOSY
PREFILLED_SYRINGE | INTRAVENOUS | Status: DC | PRN
  Administered 2021-06-29: 60 mg via INTRAVENOUS

## 2021-06-29 MED ORDER — DEXTROSE 50 % IV SOLN
INTRAVENOUS | Status: AC
  Filled 2021-06-29: qty 50

## 2021-06-29 MED ORDER — PHENYLEPHRINE HCL (PRESSORS) 10 MG/ML IV SOLN
INTRAVENOUS | Status: DC | PRN
  Administered 2021-06-29 (×2): 100 ug via INTRAVENOUS

## 2021-06-29 MED ORDER — PROPOFOL 500 MG/50ML IV EMUL
INTRAVENOUS | Status: DC | PRN
  Administered 2021-06-29: 100 ug/kg/min via INTRAVENOUS

## 2021-06-29 MED ORDER — SODIUM CHLORIDE 0.9 % IV SOLN: INTRAVENOUS | Status: AC

## 2021-06-29 MED ORDER — SODIUM CHLORIDE 0.9 % IV SOLN: INTRAVENOUS | Status: DC

## 2021-06-29 MED ORDER — DEXTROSE 50 % IV SOLN
25.0000 g | INTRAVENOUS | Status: AC
  Administered 2021-06-29: 25 g via INTRAVENOUS

## 2021-06-29 MED ORDER — PROPOFOL 10 MG/ML IV BOLUS
INTRAVENOUS | Status: AC
  Filled 2021-06-29: qty 20

## 2021-06-29 NOTE — Anesthesia Postprocedure Evaluation (Signed)
Anesthesia Post Note  Patient: [name] [name]  Procedure(s) Performed: ESOPHAGOGASTRODUODENOSCOPY (EGD) COLONOSCOPY  Patient location during evaluation: PACU Anesthesia Type: General Level of consciousness: awake and alert Pain management: pain level controlled Vital Signs Assessment: post-procedure vital signs reviewed and stable Respiratory status: spontaneous breathing, nonlabored ventilation, respiratory function stable and patient connected to nasal cannula oxygen Cardiovascular status: blood pressure returned to baseline and stable Postop Assessment: no apparent nausea or vomiting Anesthetic complications: no   No notable events documented.   Last Vitals:  Vitals:   06/29/21 1355 06/29/21 1405  BP: (!) 120/57 (!) 119/57  Pulse: 73 72  Resp: 20 19  Temp:    SpO2: 99% 100%    Last Pain:  Vitals:   06/29/21 1405  TempSrc:   PainSc: 0-No pain                 [place]

## 2021-06-29 NOTE — Interval H&P Note (Signed)
History and Physical Interval Note:  06/29/2021 1:12 PM  [name] [name]  has presented today for surgery, with the diagnosis of Symptomatic anemia, iron-deficiency anemia, occult GI bleeding.  The various methods of treatment have been discussed with the patient and family. After consideration of risks, benefits and other options for treatment, the patient has consented to  Procedure(s): ESOPHAGOGASTRODUODENOSCOPY (EGD) (N/A) COLONOSCOPY (N/A) as a surgical intervention.  The patient's history has been reviewed, patient examined, no change in status, stable for surgery.  I have reviewed the patient's chart and labs.  Questions were answered to the patient's satisfaction.     [place], [place]

## 2021-06-29 NOTE — Transfer of Care (Signed)
Immediate Anesthesia Transfer of Care Note  Patient: [name] [name]  Procedure(s) Performed: ESOPHAGOGASTRODUODENOSCOPY (EGD) COLONOSCOPY  Patient Location: PACU and Endoscopy Unit  Anesthesia Type:General  Level of Consciousness: drowsy  Airway & Oxygen Therapy: Patient Spontanous Breathing  Post-op Assessment: Report given to RN and Post -op Vital signs reviewed and stable  Post vital signs: Reviewed and stable  Last Vitals:  Vitals Value Taken Time  BP 103/51 06/29/21 1347  Temp 36.4 C 06/29/21 1345  Pulse 75 06/29/21 1345  Resp 24 06/29/21 1351  SpO2 100 % 06/29/21 1345  Vitals shown include unvalidated device data.  Last Pain:  Vitals:   06/29/21 1345  TempSrc: Temporal  PainSc: Asleep         Complications: No notable events documented.

## 2021-06-29 NOTE — Op Note (Signed)
[facility] Patient Name: Wilhelmenia Addis Procedure Date: 06/29/2021 1:08 PM MRN: [id number] Account #: [id number] Date of Birth: [birth year] Admit Type: Inpatient Age: 70 Room: [facility] ENDO ROOM 1 Gender: Female Note Status: Finalized Instrument Name: Upper Endoscope [id number] Procedure:             Upper GI endoscopy Indications:           Iron deficiency anemia secondary to chronic blood loss Providers:             Benay Pike. Alice Reichert MD, MD Referring MD:          Tracie Harrier, MD (Referring MD) Medicines:             Propofol per Anesthesia Complications:         No immediate complications. Procedure:             Pre-Anesthesia Assessment:                        - The risks and benefits of the procedure and the                         sedation options and risks were discussed with the                         patient. All questions were answered and informed                         consent was obtained.                        - Patient identification and proposed procedure were                         verified prior to the procedure by the nurse. The                         procedure was verified in the procedure room.                        - ASA Grade Assessment: III - A patient with severe                         systemic disease.                        - After reviewing the risks and benefits, the patient                         was deemed in satisfactory condition to undergo the                         procedure.                        After obtaining informed consent, the endoscope was                         passed under direct vision. Throughout the procedure,  the patient's blood pressure, pulse, and oxygen                         saturations were monitored continuously. The                         Endosonoscope was introduced through the mouth, and                         advanced to the third part of duodenum. The  upper GI                         endoscopy was accomplished without difficulty. The                         patient tolerated the procedure well. Findings:      The esophagus was normal.      Patchy mild inflammation characterized by erosions was found in the       gastric body and in the gastric antrum.      A 1 cm hiatal hernia was present.      The examined duodenum was normal.      The exam was otherwise without abnormality. Impression:            - Normal esophagus.                        - Gastritis.                        - 1 cm hiatal hernia.                        - Normal examined duodenum.                        - The examination was otherwise normal.                        - No specimens collected. Recommendation:        - Proceed with colonoscopy Procedure Code(s):     --- Professional ---                        [phone], Esophagogastroduodenoscopy, flexible,                         transoral; diagnostic, including collection of                         specimen(s) by brushing or washing, when performed                         (separate procedure) Diagnosis Code(s):     --- Professional ---                        D50.0, Iron deficiency anemia secondary to blood loss                         (chronic)  K44.9, Diaphragmatic hernia without obstruction or                         gangrene                        K29.70, Gastritis, unspecified, without bleeding CPT copyright 2019 [employer]. All rights reserved. The codes documented in this report are preliminary and upon coder review may  be revised to meet current compliance requirements. Efrain Sella MD, MD 06/29/2021 1:23:02 PM This report has been signed electronically. Number of Addenda: 0 Note Initiated On: 06/29/2021 1:08 PM Estimated Blood Loss:  Estimated blood loss: none.      [facility]

## 2021-06-29 NOTE — Op Note (Signed)
[facility] Gastroenterology Patient Name: [name] [name] Procedure Date: 06/29/2021 1:07 PM MRN: [id number] Account #: [id number] Date of Birth: [birth year] Admit Type: Inpatient Age: 70 Room: [facility] ENDO ROOM 1 Gender: Female Note Status: Finalized Instrument Name: Jasper Riling [id number] Procedure:             Colonoscopy Indications:           Heme positive stool, Iron deficiency anemia secondary                         to chronic blood loss Providers:             Benay Pike. Alice Reichert MD, MD Referring MD:          Tracie Harrier, MD (Referring MD) Medicines:             Propofol per Anesthesia Complications:         No immediate complications. Procedure:             Pre-Anesthesia Assessment:                        - The risks and benefits of the procedure and the                         sedation options and risks were discussed with the                         patient. All questions were answered and informed                         consent was obtained.                        - Patient identification and proposed procedure were                         verified prior to the procedure by the nurse.                        - ASA Grade Assessment: III - A patient with severe                         systemic disease.                        - After reviewing the risks and benefits, the patient                         was deemed in satisfactory condition to undergo the                         procedure.                        After obtaining informed consent, the colonoscope was                         passed under direct vision. Throughout the procedure,                         the patient's blood  pressure, pulse, and oxygen                         saturations were monitored continuously. The                         Colonoscope was introduced through the anus and                         advanced to the the cecum, identified by appendiceal                         orifice and  ileocecal valve. The colonoscopy was                         performed without difficulty. The patient tolerated                         the procedure well. The quality of the bowel                         preparation was adequate. The ileocecal valve,                         appendiceal orifice, and rectum were photographed. Findings:      The perianal and digital rectal examinations were normal. Pertinent       negatives include normal sphincter tone and no palpable rectal lesions.      Multiple small and large-mouthed diverticula were found in the entire       colon. There was no evidence of diverticular bleeding.      A 10 mm polyp was found in the descending colon. The polyp was       pedunculated. The polyp was removed with a hot snare. Resection and       retrieval were complete.      The exam was otherwise without abnormality. Impression:            - Moderate diverticulosis in the entire examined                         colon. There was no evidence of diverticular bleeding.                        - One 10 mm polyp in the descending colon, removed                         with a hot snare. Resected and retrieved.                        - The examination was otherwise normal. Recommendation:        - Patient has a contact number available for                         emergencies. The signs and symptoms of potential                         delayed complications were discussed with the patient.  Return to normal activities tomorrow. Written                         discharge instructions were provided to the patient.                        - Return patient to hospital ward for possible                         discharge same day.                        - To visualize the small bowel, perform video capsule                         endoscopy at appointment to be scheduled.                        - Await pathology results.                        - The findings and  recommendations were discussed with                         the patient. Procedure Code(s):     --- Professional ---                        [phone], Colonoscopy, flexible; with removal of                         tumor(s), polyp(s), or other lesion(s) by snare                         technique Diagnosis Code(s):     --- Professional ---                        K57.30, Diverticulosis of large intestine without                         perforation or abscess without bleeding                        D50.0, Iron deficiency anemia secondary to blood loss                         (chronic)                        R19.5, Other fecal abnormalities                        K63.5, Polyp of colon CPT copyright 2019 American Medical Association. All rights reserved. The codes documented in this report are preliminary and upon coder review may  be revised to meet current compliance requirements. Efrain Sella MD, MD 06/29/2021 1:43:28 PM This report has been signed electronically. Number of Addenda: 0 Note Initiated On: 06/29/2021 1:07 PM Scope Withdrawal Time: 0 hours 5 minutes 25 seconds  Total Procedure Duration: 0 hours 14 minutes 40 seconds  Estimated Blood Loss:  Estimated blood loss: none.  [facility]

## 2021-06-29 NOTE — Interval H&P Note (Signed)
History and Physical Interval Note:  06/29/2021 1:12 PM  [name] [name]  has presented today for surgery, with the diagnosis of Symptomatic anemia, iron-deficiency anemia, occult GI bleeding.  The various methods of treatment have been discussed with the patient and family. After consideration of risks, benefits and other options for treatment, the patient has consented to  Procedure(s): ESOPHAGOGASTRODUODENOSCOPY (EGD) (N/A) COLONOSCOPY (N/A) as a surgical intervention.  The patient's history has been reviewed, patient examined, no change in status, stable for surgery.  I have reviewed the patient's chart and labs.  Questions were answered to the patient's satisfaction.     [place], [place]

## 2021-06-29 NOTE — Progress Notes (Signed)
Pt BS now 131.

## 2021-06-29 NOTE — Progress Notes (Signed)
PROGRESS NOTE  [name] [name]    DOB: [birth year], 70 y.o.  DXI:[id number]  PCP: Tracie Harrier, MD   Code Status: Full Code   DOA: 06/27/2021   LOS: 2  Brief Narrative of Current Hospitalization  [name] [name] is a 70 y.o. female with a PMH significant for HTN, type 2 diabetes, GERD, IDA, asthma, chronic pain. They presented from home to the ED on 06/27/2021 with dizziness and orthostatic symptoms for several days . In the ED, it was found that they had hemoglobin of 5.2. They were treated with 2 units packed red blood cells.  Patient was admitted to medicine service for further workup and management of symptomatic anemia as outlined in detail below.  06/29/21 -GI planning endoscopy today  Assessment & Plan  Principal Problem:   Symptomatic anemia Active Problems:   Aortic stenosis   Benign essential hypertension   Chronic pain syndrome   Type II diabetes mellitus with renal manifestations (HCC)   Gastroesophageal reflux disease without esophagitis   History of adenomatous polyp of colon   HTN (hypertension)  Symptomatic IDA- hgb 5.2 on admission. S/p 2 units pRBCs. Hgb 7.3 today. GI has been consulted. No active signs of bleeding. - EGD and colonoscopy planned today. Patient had bowel prep overnight - f/u GI - CBC am - IV iron -Maintenance IV fluids until taking p.o.  Nausea-resolved  HTN- well controlled. Home meds include- losartan -Continue home meds  H/o CKDIII- normal Cr  T2DM- diet controlled - CBGs with labs  HLD-no home statin therapy and no allergies in chart -Consider starting statin with PCP  Pulmonary fibrosis  Asthma- mild, intermittent - continue home dulera  Chronic pain-Home meds include Oxy IR as needed -Continue home as needed Oxy IR  DVT prophylaxis: SCDs Start: 06/27/21 1930   Diet:  Diet Orders (From admission, onward)     Start     Ordered   06/29/21 0001  Diet NPO time specified  Diet effective midnight        06/28/21 1546             Subjective 06/29/21    Patient feels well today. No concerns at this time. Denies blood Bms, abdominal pain, N/V.  Disposition Plan & Communication  Status is: Inpatient  Remains inpatient appropriate because:Ongoing diagnostic testing needed not appropriate for outpatient work up  Dispo: The patient is from: Home              Anticipated d/c is to: Home              Patient currently is not medically stable to d/c.   Difficult to place patient No  Family Communication: N/A  Consults, Procedures, Significant Events  Consultants:  GI  Procedures/significant events:  endoscopy  Antimicrobials:  Anti-infectives (From admission, onward)    None        Objective   Vitals:   06/28/21 1643 06/28/21 2004 06/29/21 0047 06/29/21 0533  BP: (!) 149/72 117/64 140/67 112/60  Pulse: 83 78 80 81  Resp: 20 17 17 16   Temp: 98.2 F (36.8 C) 98.7 F (37.1 C) 98.2 F (36.8 C) 98.6 F (37 C)  TempSrc: Oral Oral Oral Oral  SpO2: 100% 100% 100% 99%  Weight:      Height:        Intake/Output Summary (Last 24 hours) at 06/29/2021 0719 Last data filed at 06/29/2021 0302 Gross per 24 hour  Intake 1596.96 ml  Output --  Net 1596.96 ml  Filed Weights   06/27/21 1502  Weight: 64 kg    Patient BMI: Body mass index is 28.48 kg/m.   Physical Exam: General: awake, alert, NAD HEENT: atraumatic, clear conjunctiva, anicteric sclera, moist mucus membranes, hearing grossly normal Respiratory:  normal respiratory effort. Cardiovascular: normal S1/S2,  RRR, gallops, quick capillary refill  Gastrointestinal: soft, NT, ND, no HSM felt Nervous: A&O x4. no gross focal neurologic deficits, normal speech Extremities: moves all equally, no edema, normal tone Skin: dry, intact, normal temperature, normal color, No rashes, lesions or ulcers Psychiatry: normal mood, congruent affect, judgement and insight appear normal  Labs   I have personally reviewed following labs and imaging  studies Admission on 06/27/2021  Component Date Value Ref Range Status   Glucose-Capillary 06/27/2021 84  70 - 99 mg/dL Final   Sodium 06/27/2021 139  135 - 145 mmol/L Final   Potassium 06/27/2021 3.8  3.5 - 5.1 mmol/L Final   Chloride 06/27/2021 108  98 - 111 mmol/L Final   CO2 06/27/2021 23  22 - 32 mmol/L Final   Glucose, Bld 06/27/2021 140 (A) 70 - 99 mg/dL Final   BUN 06/27/2021 31 (A) 8 - 23 mg/dL Final   Creatinine, Ser 06/27/2021 1.05 (A) 0.44 - 1.00 mg/dL Final   Calcium 06/27/2021 8.6 (A) 8.9 - 10.3 mg/dL Final   GFR, Estimated 06/27/2021 58 (A) >60 mL/min Final   Anion gap 06/27/2021 8  5 - 15 Final   WBC 06/27/2021 9.6  4.0 - 10.5 K/uL Final   RBC 06/27/2021 2.61 (A) 3.87 - 5.11 MIL/uL Final   Hemoglobin 06/27/2021 5.2 (A) 12.0 - 15.0 g/dL Final   HCT 06/27/2021 18.0 (A) 36.0 - 46.0 % Final   MCV 06/27/2021 69.0 (A) 80.0 - 100.0 fL Final   MCH 06/27/2021 19.9 (A) 26.0 - 34.0 pg Final   MCHC 06/27/2021 28.9 (A) 30.0 - 36.0 g/dL Final   RDW 06/27/2021 16.6 (A) 11.5 - 15.5 % Final   Platelets 06/27/2021 450 (A) 150 - 400 K/uL Final   nRBC 06/27/2021 0.0  0.0 - 0.2 % Final   Color, Urine 06/27/2021 YELLOW  YELLOW Final   APPearance 06/27/2021 CLEAR  CLEAR Final   Specific Gravity, Urine 06/27/2021 1.015  1.005 - 1.030 Final   pH 06/27/2021 6.0  5.0 - 8.0 Final   Glucose, UA 06/27/2021 NEGATIVE  NEGATIVE mg/dL Final   Hgb urine dipstick 06/27/2021 NEGATIVE  NEGATIVE Final   Bilirubin Urine 06/27/2021 NEGATIVE  NEGATIVE Final   Ketones, ur 06/27/2021 NEGATIVE  NEGATIVE mg/dL Final   Protein, ur 06/27/2021 NEGATIVE  NEGATIVE mg/dL Final   Nitrite 06/27/2021 NEGATIVE  NEGATIVE Final   Leukocytes,Ua 06/27/2021 SMALL (A) NEGATIVE Final   Squamous Epithelial / LPF 06/27/2021 0-5  0 - 5 Final   WBC, UA 06/27/2021 0-5  0 - 5 WBC/hpf Final   RBC / HPF 06/27/2021 NONE SEEN  0 - 5 RBC/hpf Final   Bacteria, UA 06/27/2021 RARE (A) NONE SEEN Final   Mucus 06/27/2021 PRESENT   Final    Glucose-Capillary 06/28/2021 85  70 - 99 mg/dL Final   Troponin I (High Sensitivity) 06/27/2021 7  <18 ng/L Final   Iron 06/27/2021 9 (A) 28 - 170 ug/dL Final   TIBC 06/27/2021 514 (A) 250 - 450 ug/dL Final   Saturation Ratios 06/27/2021 2 (A) 10.4 - 31.8 % Final   UIBC 06/27/2021 505  ug/dL Final   Retic Ct Pct 06/27/2021 1.8  0.4 - 3.1 % Final  RBC. 06/27/2021 2.64 (A) 3.87 - 5.11 MIL/uL Final   Retic Count, Absolute 06/27/2021 48.0  19.0 - 186.0 K/uL Final   Immature Retic Fract 06/27/2021 14.2  2.3 - 15.9 % Final   Ferritin 06/27/2021 6 (A) 11 - 307 ng/mL Final   Order Confirmation 06/27/2021    Final                   Value:ORDER PROCESSED BY BLOOD BANK Performed at [facility], [address] [place], [place] [address]    ABO/RH(D) 06/27/2021 A POS   Final   Antibody Screen 06/27/2021 NEG   Final   Sample Expiration 06/27/2021 06/30/2021,2359   Final   Unit Number 06/27/2021 [id number]   Final   Blood Component Type 06/27/2021 RED CELLS,LR   Final   Unit division 06/27/2021 00   Final   Status of Unit 06/27/2021 ISSUED,FINAL   Final   Transfusion Status 06/27/2021 OK TO TRANSFUSE   Final   Crossmatch Result 06/27/2021    Final                   Value:Compatible Performed at [facility], [address], [place], [place] [address]    Unit Number 06/27/2021 [id number]   Final   Blood Component Type 06/27/2021 RBC LR PHER1   Final   Unit division 06/27/2021 00   Final   Status of Unit 06/27/2021 ISSUED   Final   Transfusion Status 06/27/2021 OK TO TRANSFUSE   Final   Crossmatch Result 06/27/2021 Compatible   Final   Sodium 06/28/2021 140  135 - 145 mmol/L Final   Potassium 06/28/2021 3.9  3.5 - 5.1 mmol/L Final   Chloride 06/28/2021 109  98 - 111 mmol/L Final   CO2 06/28/2021 24  22 - 32 mmol/L Final   Glucose, Bld 06/28/2021 81  70 - 99 mg/dL Final   BUN 06/28/2021 26 (A) 8 - 23 mg/dL Final   Creatinine, Ser 06/28/2021 0.82  0.44 - 1.00 mg/dL  Final   Calcium 06/28/2021 8.6 (A) 8.9 - 10.3 mg/dL Final   GFR, Estimated 06/28/2021 >60  >60 mL/min Final   Anion gap 06/28/2021 7  5 - 15 Final   WBC 06/28/2021 8.6  4.0 - 10.5 K/uL Final   RBC 06/28/2021 3.16 (A) 3.87 - 5.11 MIL/uL Final   Hemoglobin 06/28/2021 7.4 (A) 12.0 - 15.0 g/dL Final   HCT 06/28/2021 23.7 (A) 36.0 - 46.0 % Final   MCV 06/28/2021 75.0 (A) 80.0 - 100.0 fL Final   MCH 06/28/2021 23.4 (A) 26.0 - 34.0 pg Final   MCHC 06/28/2021 31.2  30.0 - 36.0 g/dL Final   RDW 06/28/2021 20.3 (A) 11.5 - 15.5 % Final   Platelets 06/28/2021 384  150 - 400 K/uL Final   nRBC 06/28/2021 0.0  0.0 - 0.2 % Final   ISSUE DATE / TIME 06/27/2021 [id number]   Final   Blood Product Unit Number 06/27/2021 [id number]   Final   PRODUCT CODE 06/27/2021 [id number]   Final   Unit Type and Rh 06/27/2021 0600   Final   Blood Product Expiration Date 06/27/2021 [id number]   Final   ISSUE DATE / TIME 06/27/2021 [id number]   Final   Blood Product Unit Number 06/27/2021 [id number]   Final   PRODUCT CODE 06/27/2021 [id number]   Final   Unit Type and Rh 06/27/2021 0600   Final   Blood Product Expiration Date 06/27/2021 [id number]   Final   Vitamin  B-12 06/28/2021 514  180 - 914 pg/mL Final   Folate 06/28/2021 19.5  >5.9 ng/mL Final   Iron 06/28/2021 244 (A) 28 - 170 ug/dL Final   TIBC 06/28/2021 517 (A) 250 - 450 ug/dL Final   Saturation Ratios 06/28/2021 47 (A) 10.4 - 31.8 % Final   UIBC 06/28/2021 273  ug/dL Final   Ferritin 06/28/2021 6 (A) 11 - 307 ng/mL Final   Retic Ct Pct 06/28/2021 1.0  0.4 - 3.1 % Final   RBC. 06/28/2021 3.15 (A) 3.87 - 5.11 MIL/uL Final   Retic Count, Absolute 06/28/2021 31.2  19.0 - 186.0 K/uL Final   Immature Retic Fract 06/28/2021 19.3 (A) 2.3 - 15.9 % Final   SARS Coronavirus 2 06/27/2021 NEGATIVE  NEGATIVE Final   HIV Screen 4th Generation wRfx 06/28/2021 Non Reactive  Non Reactive Final   Glucose-Capillary 06/28/2021 84  70 - 99 mg/dL Final   WBC  06/29/2021 9.7  4.0 - 10.5 K/uL Final   RBC 06/29/2021 3.10 (A) 3.87 - 5.11 MIL/uL Final   Hemoglobin 06/29/2021 7.3 (A) 12.0 - 15.0 g/dL Final   HCT 06/29/2021 23.6 (A) 36.0 - 46.0 % Final   MCV 06/29/2021 76.1 (A) 80.0 - 100.0 fL Final   MCH 06/29/2021 23.5 (A) 26.0 - 34.0 pg Final   MCHC 06/29/2021 30.9  30.0 - 36.0 g/dL Final   RDW 06/29/2021 20.1 (A) 11.5 - 15.5 % Final   Platelets 06/29/2021 345  150 - 400 K/uL Final   nRBC 06/29/2021 0.0  0.0 - 0.2 % Final   MRSA, PCR 06/28/2021 NEGATIVE  NEGATIVE Final   Staphylococcus aureus 06/28/2021 NEGATIVE  NEGATIVE Final   Glucose-Capillary 06/28/2021 103 (A) 70 - 99 mg/dL Final   Glucose-Capillary 06/28/2021 105 (A) 70 - 99 mg/dL Final     Recent Results (from the past 240 hour(s))  SARS CORONAVIRUS 2 (TAT 6-24 HRS) Nasopharyngeal Nasopharyngeal Swab     Status: None   Collection Time: 06/27/21  9:07 PM   Specimen: Nasopharyngeal Swab  Result Value Ref Range Status   SARS Coronavirus 2 NEGATIVE NEGATIVE Final    Comment: (NOTE) SARS-CoV-2 target nucleic acids are NOT DETECTED.  The SARS-CoV-2 RNA is generally detectable in upper and lower respiratory specimens during the acute phase of infection. Negative results do not preclude SARS-CoV-2 infection, do not rule out co-infections with other pathogens, and should not be used as the sole basis for treatment or other patient management decisions. Negative results must be combined with clinical observations, patient history, and epidemiological information. The expected result is Negative.  Fact Sheet for Patients: SugarRoll.be  Fact Sheet for Healthcare Providers: https://www.woods-mathews.com/  This test is not yet approved or cleared by the [place] FDA and  has been authorized for detection and/or diagnosis of SARS-CoV-2 by FDA under an Emergency Use Authorization (EUA). This EUA will remain  in effect (meaning this test can be  used) for the duration of the COVID-19 declaration under Se ction 564(b)(1) of the Act, 21 U.S.C. section 360bbb-3(b)(1), unless the authorization is terminated or revoked sooner.  Performed at [facility], [place] [address], [place], [place] [address]   Surgical PCR screen     Status: None   Collection Time: 06/28/21  5:18 PM   Specimen: Nasal Mucosa; Nasal Swab  Result Value Ref Range Status   MRSA, PCR NEGATIVE NEGATIVE Final   Staphylococcus aureus NEGATIVE NEGATIVE Final    Comment: (NOTE) The Xpert SA Assay (FDA approved for NASAL  specimens in patients 24 years of age and older), is one component of a comprehensive surveillance program. It is not intended to diagnose infection nor to guide or monitor treatment. Performed at [facility], [address] [place], [place] [address]      Imaging Studies  No results found. Medications   Scheduled Meds:  ferrous sulfate  325 mg Oral BID WC   insulin aspart  0-9 Units Subcutaneous TID WC   losartan  50 mg Oral BH-[id number]   mupirocin ointment  1 application Nasal BID   pantoprazole (PROTONIX) IV  40 mg Intravenous Once   Continuous Infusions:  sodium chloride     sodium chloride 150 mL/hr at 06/28/21 1713     LOS: 2 days   Time spent: >31min   Delle Andrzejewski L Ayah Cozzolino, DO [facility] 06/29/2021, 7:19 AM   To contact the [facility] Attending or Consulting provider for this patient: Check the care team in [facility] for a) attending/consulting [place] provider listed and b) the [facility] team listed Log into [email or web address] and use [place] universal password to access. If you do not have the password, please contact the hospital operator. Locate the [facility] provider you are looking for under [facility] and page to a number that you can be directly reached. If you still have difficulty reaching the provider, please page the [facility] (Director on Call) for the Hospitalists listed on amion for assistance.

## 2021-06-29 NOTE — Progress Notes (Addendum)
Pt NPO s/p EGD and Colonoscopy. MD notified of need for diet change with no new order placed. This RN notified of pt low BS of 43 @ 1600. Pt still NPO. MD paged per protocol. D50 given per protocol at this time. MD made aware of BS of 43.

## 2021-06-29 NOTE — Anesthesia Preprocedure Evaluation (Addendum)
Anesthesia Evaluation  Patient identified by MRN, date of birth, ID band Patient awake    Reviewed: Allergy & Precautions, H&P , NPO status , reviewed documented beta blocker date and time   History of Anesthesia Complications Negative for: history of anesthetic complications  Airway Mallampati: II  TM Distance: >3 FB Neck ROM: limited    Dental  (+) Upper Dentures, Missing   Pulmonary shortness of breath and with exertion, asthma , COPD (inhalers prn), former smoker,  Pulmonary fibrosis    Pulmonary exam normal        Cardiovascular hypertension, + DOE  + Valvular Problems/Murmurs AI  Rhythm:Regular Rate:Normal + Systolic murmurs 03/1469 ECHO 1. Left ventricular ejection fraction, by estimation, is 60 to 65%. The  left ventricle has normal function. The left ventricle has no regional  wall motion abnormalities. Left ventricular diastolic parameters are  consistent with Grade I diastolic  dysfunction (impaired relaxation).  2. Right ventricular systolic function is normal. The right ventricular  size is normal. There is moderately elevated pulmonary artery systolic  pressure.  3. The mitral valve is normal in structure. Mild mitral valve  regurgitation.  4. The aortic valve is grossly normal. Aortic valve regurgitation is mild  to moderate. Mild to moderate aortic valve sclerosis/calcification is  present, without any evidence of aortic stenosis.   RBBB on EKG   Neuro/Psych TIA Neuromuscular disease (PN) negative psych ROS   GI/Hepatic Neg liver ROS, GERD  Medicated and Controlled,  Endo/Other  diabetes, Type 2  Renal/GU CRFRenal disease  negative genitourinary   Musculoskeletal negative musculoskeletal ROS (+)   Abdominal   Peds negative pediatric ROS (+)  Hematology  (+) Blood dyscrasia, anemia ,   Anesthesia Other Findings 02/2021 video laryngoscope 3 grade 1 view   Reproductive/Obstetrics                             Anesthesia Physical Anesthesia Plan  ASA: 3 and emergent  Anesthesia Plan: General   Post-op Pain Management:    Induction:   PONV Risk Score and Plan:   Airway Management Planned: Natural Airway  Additional Equipment:   Intra-op Plan:   Post-operative Plan:   Informed Consent: I have reviewed the patients History and Physical, chart, labs and discussed the procedure including the risks, benefits and alternatives for the proposed anesthesia with the patient or authorized representative who has indicated his/her understanding and acceptance.       Plan Discussed with: CRNA  Anesthesia Plan Comments: (Transfused 06/27/21 (hgb 5.2 on admission), now 7.3)       Anesthesia Quick Evaluation

## 2021-06-30 ENCOUNTER — Encounter: Payer: Self-pay | Admitting: Internal Medicine

## 2021-06-30 LAB — GLUCOSE, CAPILLARY
Glucose-Capillary: 82 mg/dL (ref 70–99)
Glucose-Capillary: 79 mg/dL (ref 70–99)

## 2021-06-30 LAB — HEMOGLOBIN AND HEMATOCRIT, BLOOD
HCT: 23.7 % — ABNORMAL LOW (ref 36.0–46.0)
Hemoglobin: 7.5 g/dL — ABNORMAL LOW (ref 12.0–15.0)

## 2021-06-30 LAB — SURGICAL PATHOLOGY

## 2021-06-30 MED ORDER — FERROUS SULFATE 325 (65 FE) MG PO TABS: 325.0000 mg | ORAL_TABLET | Freq: Two times a day (BID) | ORAL | 1 refills | Status: AC

## 2021-06-30 MED ORDER — PANTOPRAZOLE SODIUM 40 MG PO TBEC: 40.0000 mg | DELAYED_RELEASE_TABLET | Freq: Every day | ORAL | 1 refills | Status: AC

## 2021-06-30 NOTE — Discharge Summary (Addendum)
Physician Discharge Summary  [name] [name] HAL:[id number] DOB: Mar 07, 1951 DOA: 06/27/2021  PCP: Tracie Harrier, MD  Admit date: 06/27/2021  Discharge date: 06/30/2021  Admitted From: Home. Disposition:  Home.  Recommendations for Outpatient Follow-up:  Follow up with PCP in 1-2 weeks. Please obtain BMP/CBC in one week. Advised to follow-up with GI in 2 weeks for capsule endoscopy. Advised to take iron supplements as directed.  Home Health:None Equipment/Devices:None  Discharge Condition: Stable CODE STATUS:Full code Diet recommendation: Heart Healthy   Brief Summary / Hospital course: This 70 y.o. female with PMH significant for HTN, type 2 diabetes, GERD, IDA, asthma, chronic pain  presented from home to the ED on 06/27/2021 with dizziness and orthostatic symptoms for several days . In the ED, it was found that she had hemoglobin of 5.2. She was treated with 2 units packed red blood cells.  Hemoglobin improved to 7.3.  Patient was admitted for symptomatic anemia.  Patient was evaluated by GI  and she underwent EGD and colonoscopy.  EGD showed gastritis and colonoscopy showed was a polyp which was removed.  There was no any signs of diverticular bleed.  Postprocedure hemoglobin remained 7.5,  Patient feels better and want to be discharged. Patient is cleared from GI  to be discharged, recommended outpatient capsule endoscopy in 2 weeks.  Patient denies any further dizziness and want to be discharged. Patient is being discharged home.  She was managed for below problems.  Discharge Diagnoses:  Principal Problem:   Symptomatic anemia Active Problems:   Aortic stenosis   Benign essential hypertension   Chronic pain syndrome   Type II diabetes mellitus with renal manifestations (HCC)   Gastroesophageal reflux disease without esophagitis   History of adenomatous polyp of colon   HTN (hypertension)  Symptomatic iron deficiency anemia: Hb 5.2 on admission. S/p 2 units pRBCs. Hgb 7.5  today.  GI has been consulted. No active signs of bleeding. Patient underwent EGD and colonoscopy on 06/29/21. EGD showed gastritis, normal esophagus and duodenum Colonoscopy showed a polyp which was removed.  No signs of diverticular bleed. Patient is cleared from GI to be discharged with recommendations for outpatient capsule endoscopy.   Nausea-resolved   HTN- well controlled. Home meds include- losartan -Continue home meds   H/o CKDIII- normal Creatinine   T2DM- diet controlled - CBGs with labs   HLD-no home statin therapy and no allergies in chart -Consider starting statin with PCP   Pulmonary fibrosis  Asthma- mild, intermittent - continue home dulera   Chronic pain-Home meds include Oxy IR as needed. -Continue home as needed Oxy IR  Discharge Instructions  Discharge Instructions     Call MD for:  difficulty breathing, headache or visual disturbances   Complete by: As directed    Call MD for:  persistant dizziness or light-headedness   Complete by: As directed    Call MD for:  persistant nausea and vomiting   Complete by: As directed    Diet - low sodium heart healthy   Complete by: As directed    Diet Carb Modified   Complete by: As directed    Discharge instructions   Complete by: As directed    Advised to follow-up with primary care physician in 1 week. Advised to follow-up with GI in 2 weeks for capsule endoscopy. Advised to take iron supplements as directed.   Increase activity slowly   Complete by: As directed       Allergies as of 06/30/2021  Reactions   Codeine Nausea And Vomiting   Furosemide Other (See Comments)   Penicillin V Potassium    Other reaction(s): Unknown   Quinine    Other reaction(s): Unknown   Tramadol    Other reaction(s): Unknown   Tylenol [acetaminophen]    Tylenol-Codeine: Dizziness        Medication List     STOP taking these medications    aspirin 325 MG EC tablet   iron polysaccharides 150 MG  capsule Commonly known as: NIFEREX       TAKE these medications    albuterol 108 (90 Base) MCG/ACT inhaler Commonly known as: VENTOLIN HFA Inhale 2 puffs into the lungs every 6 (six) hours as needed for wheezing or shortness of breath.   clotrimazole-betamethasone cream Commonly known as: LOTRISONE Apply 1 application topically 2 (two) times daily as needed (itching).   ferrous sulfate 325 (65 FE) MG tablet Take 1 tablet (325 mg total) by mouth 2 (two) times daily with a meal.   hydrocortisone 2.5 % cream Apply 1 application topically 2 (two) times daily. (Apply to face as spot treatment)   ketoconazole 2 % cream Commonly known as: NIZORAL Apply 1 application topically 2 (two) times daily. (Apply to areas on face)   losartan 50 MG tablet Commonly known as: COZAAR Take 50 mg by mouth every morning.   mometasone-formoterol 100-5 MCG/ACT Aero Commonly known as: DULERA Inhale 2 puffs into the lungs 2 (two) times daily as needed for wheezing or shortness of breath.   oxyCODONE 5 MG immediate release tablet Commonly known as: Oxy IR/ROXICODONE Take 1-2 tablets (5-10 mg total) by mouth every 4 (four) hours as needed for moderate pain. What changed:  how much to take when to take this reasons to take this   pantoprazole 40 MG tablet Commonly known as: Protonix Take 1 tablet (40 mg total) by mouth daily.        Follow-up Information     Tracie Harrier, MD. Schedule an appointment as soon as possible for a visit on 07/04/2021.   Specialty: Internal Medicine Why: @9 ;Max Sane information: [address] [place] [address] [phone]         Efrain Sella, MD Follow up in 1 week(s).   Specialty: Gastroenterology Contact information: [place] [address] [phone]                Allergies  Allergen Reactions   Codeine Nausea And Vomiting   Furosemide Other (See Comments)   Penicillin V Potassium      Other reaction(s): Unknown   Quinine     Other reaction(s): Unknown   Tramadol     Other reaction(s): Unknown   Tylenol [Acetaminophen]     Tylenol-Codeine: Dizziness    Consultations: GI   Procedures/Studies: EGD and colonoscopy.    Subjective: Patient was seen and examined at bedside.  Overnight events noted.   Patient reports feeling much improved.  She denies any further bleeding.  She wants to be discharged.  Discharge Exam: Vitals:   06/30/21 0748 06/30/21 1143  BP: (!) 105/39 (!) 110/57  Pulse: 80 76  Resp: 18 18  Temp: 97.7 F (36.5 C) 98.2 F (36.8 C)  SpO2: 100% 100%   Vitals:   06/29/21 2121 06/29/21 2357 06/30/21 0748 06/30/21 1143  BP: (!) 110/45 (!) 106/36 (!) 105/39 (!) 110/57  Pulse: 87 86 80 76  Resp: 20 17 18 18   Temp: 99.8 F (37.7 C) 98 F (  36.7 C) 97.7 F (36.5 C) 98.2 F (36.8 C)  TempSrc: Oral  Oral Oral  SpO2: 99% 99% 100% 100%  Weight:      Height:        General: Pt is alert, awake, not in acute distress Cardiovascular: RRR, S1/S2 +, no rubs, no gallops Respiratory: CTA bilaterally, no wheezing, no rhonchi Abdominal: Soft, NT, ND, bowel sounds + Extremities: no edema, no cyanosis    The results of significant diagnostics from this hospitalization (including imaging, microbiology, ancillary and laboratory) are listed below for reference.     Microbiology: Recent Results (from the past 240 hour(s))  SARS CORONAVIRUS 2 (TAT 6-24 HRS) Nasopharyngeal Nasopharyngeal Swab     Status: None   Collection Time: 06/27/21  9:07 PM   Specimen: Nasopharyngeal Swab  Result Value Ref Range Status   SARS Coronavirus 2 NEGATIVE NEGATIVE Final    Comment: (NOTE) SARS-CoV-2 target nucleic acids are NOT DETECTED.  The SARS-CoV-2 RNA is generally detectable in upper and lower respiratory specimens during the acute phase of infection. Negative results do not preclude SARS-CoV-2 infection, do not rule out co-infections with other pathogens,  and should not be used as the sole basis for treatment or other patient management decisions. Negative results must be combined with clinical observations, patient history, and epidemiological information. The expected result is Negative.  Fact Sheet for Patients: SugarRoll.be  Fact Sheet for Healthcare Providers: https://www.woods-mathews.com/  This test is not yet approved or cleared by the [place] FDA and  has been authorized for detection and/or diagnosis of SARS-CoV-2 by FDA under an Emergency Use Authorization (EUA). This EUA will remain  in effect (meaning this test can be used) for the duration of the COVID-19 declaration under Se ction 564(b)(1) of the Act, 21 U.S.C. section 360bbb-3(b)(1), unless the authorization is terminated or revoked sooner.  Performed at [facility], [place] [address] [place], [place] [address]   Surgical PCR screen     Status: None   Collection Time: 06/28/21  5:18 PM   Specimen: Nasal Mucosa; Nasal Swab  Result Value Ref Range Status   MRSA, PCR NEGATIVE NEGATIVE Final   Staphylococcus aureus NEGATIVE NEGATIVE Final    Comment: (NOTE) The Xpert SA Assay (FDA approved for NASAL specimens in patients 72 years of age and older), is one component of a comprehensive surveillance program. It is not intended to diagnose infection nor to guide or monitor treatment. Performed at [facility], [address] [place], [place] [address]      Labs: BNP (last 3 results) No results for input(s): BNP in the last 8760 hours. Basic Metabolic Panel: Recent Labs  Lab 06/27/21 1507 06/28/21 0441  NA 139 140  K 3.8 3.9  CL 108 109  CO2 23 24  GLUCOSE 140* 81  BUN 31* 26*  CREATININE 1.05* 0.82  CALCIUM 8.6* 8.6*   Liver Function Tests: No results for input(s): AST, ALT, ALKPHOS, BILITOT, PROT, ALBUMIN in the last 168 hours. No results for input(s): LIPASE, AMYLASE in the last 168  hours. No results for input(s): AMMONIA in the last 168 hours. CBC: Recent Labs  Lab 06/27/21 1507 06/28/21 0441 06/29/21 0516 06/30/21 0847  WBC 9.6 8.6 9.7  --   HGB 5.2* 7.4* 7.3* 7.5*  HCT 18.0* 23.7* 23.6* 23.7*  MCV 69.0* 75.0* 76.1*  --   PLT 450* 384 345  --    Cardiac Enzymes: No results for input(s): CKTOTAL, CKMB, CKMBINDEX, TROPONINI in the last  168 hours. BNP: Invalid input(s): POCBNP CBG: Recent Labs  Lab 06/29/21 2027 06/29/21 2029 06/29/21 2112 06/29/21 2131 06/30/21 0751  GLUCAP 45* 71 39* 96 82   D-Dimer No results for input(s): DDIMER in the last 72 hours. Hgb A1c Recent Labs    06/28/21 0441  HGBA1C 5.6   Lipid Profile No results for input(s): CHOL, HDL, LDLCALC, TRIG, CHOLHDL, LDLDIRECT in the last 72 hours. Thyroid function studies No results for input(s): TSH, T4TOTAL, T3FREE, THYROIDAB in the last 72 hours.  Invalid input(s): FREET3 Anemia work up Recent Labs    06/27/21 1849 06/28/21 0441  VITAMINB12  --  514  FOLATE  --  19.5  FERRITIN 6* 6*  TIBC 514* 517*  IRON 9* 244*  RETICCTPCT 1.8 1.0   Urinalysis    Component Value Date/Time   COLORURINE YELLOW 06/27/2021 1824   APPEARANCEUR CLEAR 06/27/2021 1824   LABSPEC 1.015 06/27/2021 1824   PHURINE 6.0 06/27/2021 1824   GLUCOSEU NEGATIVE 06/27/2021 1824   HGBUR NEGATIVE 06/27/2021 1824   BILIRUBINUR NEGATIVE 06/27/2021 1824   KETONESUR NEGATIVE 06/27/2021 1824   PROTEINUR NEGATIVE 06/27/2021 1824   NITRITE NEGATIVE 06/27/2021 1824   LEUKOCYTESUR SMALL (A) 06/27/2021 1824   Sepsis Labs Invalid input(s): PROCALCITONIN,  WBC,  LACTICIDVEN Microbiology Recent Results (from the past 240 hour(s))  SARS CORONAVIRUS 2 (TAT 6-24 HRS) Nasopharyngeal Nasopharyngeal Swab     Status: None   Collection Time: 06/27/21  9:07 PM   Specimen: Nasopharyngeal Swab  Result Value Ref Range Status   SARS Coronavirus 2 NEGATIVE NEGATIVE Final    Comment: (NOTE) SARS-CoV-2 target nucleic  acids are NOT DETECTED.  The SARS-CoV-2 RNA is generally detectable in upper and lower respiratory specimens during the acute phase of infection. Negative results do not preclude SARS-CoV-2 infection, do not rule out co-infections with other pathogens, and should not be used as the sole basis for treatment or other patient management decisions. Negative results must be combined with clinical observations, patient history, and epidemiological information. The expected result is Negative.  Fact Sheet for Patients: SugarRoll.be  Fact Sheet for Healthcare Providers: https://www.woods-mathews.com/  This test is not yet approved or cleared by the [place] FDA and  has been authorized for detection and/or diagnosis of SARS-CoV-2 by FDA under an Emergency Use Authorization (EUA). This EUA will remain  in effect (meaning this test can be used) for the duration of the COVID-19 declaration under Se ction 564(b)(1) of the Act, 21 U.S.C. section 360bbb-3(b)(1), unless the authorization is terminated or revoked sooner.  Performed at [facility], [place] [address] [place], [place] [address]   Surgical PCR screen     Status: None   Collection Time: 06/28/21  5:18 PM   Specimen: Nasal Mucosa; Nasal Swab  Result Value Ref Range Status   MRSA, PCR NEGATIVE NEGATIVE Final   Staphylococcus aureus NEGATIVE NEGATIVE Final    Comment: (NOTE) The Xpert SA Assay (FDA approved for NASAL specimens in patients 62 years of age and older), is one component of a comprehensive surveillance program. It is not intended to diagnose infection nor to guide or monitor treatment. Performed at [facility], [address] [place], [place] [address]      Time coordinating discharge: Over 30 minutes  SIGNED:   Shawna Clamp, MD  [facility] 06/30/2021, 11:58 AM Pager   If 7PM-7AM, please contact night-coverage

## 2021-06-30 NOTE — Care Management Important Message (Signed)
Important Message  Patient Details  Name: [name] [name] MRN: [id number] Date of Birth: [birth year]   Medicare Important Message Given:  Yes     [name] 06/30/2021, 11:07 AM

## 2021-06-30 NOTE — Progress Notes (Signed)
Patient with bed alarm sounding.  Upon entry into patient's room, patient out of bed at the sink.  Patient states she is going to the bathroom to brush her hair and teeth.  I asked that she allow me to get her some non skid socks as the floors can be slippery and patient refused.  I educated patient that this is for her safety while ambulating to prevent falls but she refused.  She also refused to let me stay in the bathroom with her.  Educated patient on call bells in the bathroom and she states understanding.  Will continue to monitor.

## 2021-06-30 NOTE — Progress Notes (Signed)
Inpatient Diabetes Program Recommendations  AACE/ADA: New Consensus Statement on Inpatient Glycemic Control (2015)  Target Ranges:  Prepandial:   less than 140 mg/dL      Peak postprandial:   less than 180 mg/dL (1-2 hours)      Critically ill patients:  140 - 180 mg/dL  Results for ELIJAH, MICHAELIS (MRN [id number]) as of 06/30/2021 08:55  Ref. Range 06/29/2021 08:21 06/29/2021 11:24 06/29/2021 15:54 06/29/2021 16:39 06/29/2021 20:27 06/29/2021 20:29 06/29/2021 21:12 06/29/2021 21:31  Glucose-Capillary Latest Ref Range: 70 - 99 mg/dL 82 75 43 (LL) 131 (H)  1 unit Novolog @1752   45 (L) 71 39 (LL) 96  Results for ELLISSA, AYO (MRN [id number]) as of 06/30/2021 08:55  Ref. Range 06/30/2021 07:51  Glucose-Capillary Latest Ref Range: 70 - 99 mg/dL 82    Admit with: Symptomatic anemia/ Severe iron-deficiency anemia  History: DM2  Home DM Meds: None  Current Orders: Novolog Sensitive Correction Scale/ SSI (0-9 units) TID AC    MD- Note patient with History of Type 2 diabetes.  No meds at home.  Severe Hypoglycemia noted yesterday.  Please consider stopping Novolog SSI for now but continue CBG checks TID AC + HS     --Will follow patient during hospitalization--  Wyn Quaker RN, MSN, CDE Diabetes Coordinator Inpatient Glycemic Control Team Team Pager: [phone] (8a-5p)

## 2021-06-30 NOTE — Discharge Instructions (Signed)
Advised to follow-up with primary care physician in 1 week. Advised to follow-up with GI in 2 weeks for capsule endoscopy. Advised to take iron supplements as directed.

## 2021-07-05 ENCOUNTER — Other Ambulatory Visit: Payer: Self-pay

## 2021-07-05 ENCOUNTER — Ambulatory Visit
Admission: RE | Admit: 2021-07-05 | Discharge: 2021-07-05 | Disposition: A | Discharge status: Home / Self Care | Payer: Medicare Other | Source: Ambulatory Visit | Attending: Gastroenterology | Admitting: Gastroenterology

## 2021-07-05 DIAGNOSIS — K862 Cyst of pancreas: Secondary | ICD-10-CM | POA: Insufficient documentation

## 2021-07-05 MED ORDER — GADOBUTROL 1 MMOL/ML IV SOLN
6.0000 mL | Freq: Once | INTRAVENOUS | Status: AC | PRN
  Administered 2021-07-05: 6 mL via INTRAVENOUS

## 2021-08-23 DIAGNOSIS — J449 Chronic obstructive pulmonary disease, unspecified: Secondary | ICD-10-CM | POA: Insufficient documentation

## 2021-08-23 DIAGNOSIS — D179 Benign lipomatous neoplasm, unspecified: Secondary | ICD-10-CM | POA: Insufficient documentation

## 2021-09-14 ENCOUNTER — Encounter: Payer: Self-pay | Admitting: *Deleted

## 2021-09-15 ENCOUNTER — Encounter
Admission: RE | Disposition: A | Discharge status: Home / Self Care | Payer: Self-pay | Source: Home / Self Care | Attending: Gastroenterology

## 2021-09-15 ENCOUNTER — Encounter: Payer: Self-pay | Admitting: *Deleted

## 2021-09-15 ENCOUNTER — Ambulatory Visit
Admission: RE | Admit: 2021-09-15 | Discharge: 2021-09-15 | Disposition: A | Discharge status: Home / Self Care | Payer: Medicare Other | Attending: Gastroenterology | Admitting: Gastroenterology

## 2021-09-15 ENCOUNTER — Ambulatory Visit: Payer: Medicare Other | Admitting: Anesthesiology

## 2021-09-15 DIAGNOSIS — Z538 Procedure and treatment not carried out for other reasons: Secondary | ICD-10-CM | POA: Insufficient documentation

## 2021-09-15 DIAGNOSIS — D649 Anemia, unspecified: Secondary | ICD-10-CM | POA: Diagnosis present

## 2021-09-15 DIAGNOSIS — E119 Type 2 diabetes mellitus without complications: Secondary | ICD-10-CM | POA: Diagnosis not present

## 2021-09-15 DIAGNOSIS — Z86010 Personal history of colonic polyps: Secondary | ICD-10-CM | POA: Insufficient documentation

## 2021-09-15 LAB — GLUCOSE, CAPILLARY: Glucose-Capillary: 80 mg/dL (ref 70–99)

## 2021-09-15 SURGERY — ESOPHAGOGASTRODUODENOSCOPY (EGD) WITH PROPOFOL
Anesthesia: General

## 2021-09-15 MED ORDER — PROPOFOL 500 MG/50ML IV EMUL
INTRAVENOUS | Status: AC
  Filled 2021-09-15: qty 50

## 2021-09-15 MED ORDER — SODIUM CHLORIDE 0.9 % IV SOLN: INTRAVENOUS | Status: DC

## 2021-09-15 MED ORDER — LIDOCAINE HCL (PF) 2 % IJ SOLN
INTRAMUSCULAR | Status: AC
  Filled 2021-09-15: qty 5

## 2021-09-15 NOTE — Anesthesia Preprocedure Evaluation (Deleted)
Anesthesia Evaluation  Patient identified by MRN, date of birth, ID band Patient awake    Reviewed: Allergy & Precautions, H&P , NPO status , reviewed documented beta blocker date and time   History of Anesthesia Complications Negative for: history of anesthetic complications  Airway Mallampati: II  TM Distance: >3 FB Neck ROM: limited    Dental  (+) Upper Dentures, Missing, Poor Dentition   Pulmonary shortness of breath and with exertion, asthma , COPD (inhalers prn),  COPD inhaler, former smoker,  Pulmonary fibrosis     + decreased breath sounds      Cardiovascular Exercise Tolerance: Poor METS: 3 - Mets hypertension, pulmonary hypertension+ DOE  + Valvular Problems/Murmurs AI  Rhythm:Regular Rate:Normal + Systolic murmurs 01/5808 ECHO 1. Left ventricular ejection fraction, by estimation, is 60 to 65%. The  left ventricle has normal function. The left ventricle has no regional  wall motion abnormalities. Left ventricular diastolic parameters are  consistent with Grade I diastolic  dysfunction (impaired relaxation).  2. Right ventricular systolic function is normal. The right ventricular  size is normal. There is moderately elevated pulmonary artery systolic  pressure.  3. The mitral valve is normal in structure. Mild mitral valve  regurgitation.  4. The aortic valve is grossly normal. Aortic valve regurgitation is mild  to moderate. Mild to moderate aortic valve sclerosis/calcification is  present, without any evidence of aortic stenosis.   RBBB on EKG   Neuro/Psych TIA Neuromuscular disease (PN) negative psych ROS   GI/Hepatic Neg liver ROS, GERD  Medicated and Controlled,  Endo/Other  diabetes, Type 2  Renal/GU CRFRenal disease  negative genitourinary   Musculoskeletal negative musculoskeletal ROS (+)   Abdominal   Peds negative pediatric ROS (+)  Hematology  (+) Blood dyscrasia, anemia ,   Anesthesia  Other Findings Past Medical History: No date: Anemia No date: Asthma No date: Chronic airway obstruction (HCC) No date: Chronic kidney disease     Comment:  F/U WITH DR LATEEF No date: Diabetes mellitus without complication (HCC) No date: Dyspnea     Comment:  WITH EXERTION No date: GERD (gastroesophageal reflux disease) No date: Heart murmur     Comment:  ASYMPTOMATIC No date: History of adenomatous polyp of colon No date: History of bone density study No date: Hyperlipidemia No date: Hypertension No date: Idiopathic peripheral neuropathy No date: Morbid obesity ([place])     Comment:  40.0-44.9 No date: Osteoporosis 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 No date: Pulmonary fibrosis (HCC) No date: Sleep apnea No date: TIA (transient ischemic attack)   Reproductive/Obstetrics                            Anesthesia Physical  Anesthesia Plan  ASA: 3  Anesthesia Plan: General   Post-op Pain Management: Minimal or no pain anticipated   Induction: Intravenous  PONV Risk Score and Plan: 3 and Propofol infusion, TIVA and Ondansetron  Airway Management Planned: Natural Airway  Additional Equipment: None  Intra-op Plan:   Post-operative Plan:   Informed Consent: I have reviewed the patients History and Physical, chart, labs and discussed the procedure including the risks, benefits and alternatives for the proposed anesthesia with the patient or authorized representative who has indicated his/her understanding and acceptance.     Dental advisory given  Plan Discussed with: CRNA  Anesthesia Plan Comments: (Discussed risks of anesthesia with patient, including possibility of difficulty with spontaneous ventilation under anesthesia necessitating airway intervention, PONV, and rare  risks such as cardiac or respiratory or neurological events, and allergic reactions. Discussed the role of CRNA in patient's perioperative care. Patient understands.)        Anesthesia Quick Evaluation

## 2021-09-15 NOTE — Progress Notes (Signed)
Dr. Haig Prophet here and talked with patient and decision made for patient not to have procedure today. Transportation notified.

## 2021-11-04 IMAGING — MR MR SHOULDER*L* W/O CM
5 of 6 series · 29 of 40 positions shown · non-contrast
Comparison: None.

CLINICAL DATA: Chronic bilateral shoulder pain and limited range of
motion. No known injury.

EXAM:
MRI OF THE LEFT SHOULDER WITHOUT CONTRAST
TECHNIQUE: Multiplanar, multisequence MR imaging of the shoulder was performed.
No intravenous contrast was administered.

[Series 5: PD fat-sat · axial · left · 4.0mm · 0.55mm/px · z∈[-13,+73]mm · 6 of 20 slices shown (1 of 2)]
[im 1/20]
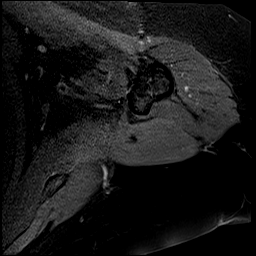
[im 4/20]
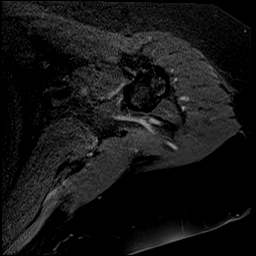
[im 8/20]
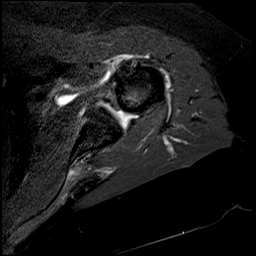
[im 12/20]
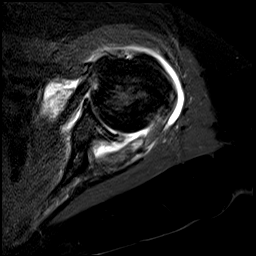
[im 16/20]
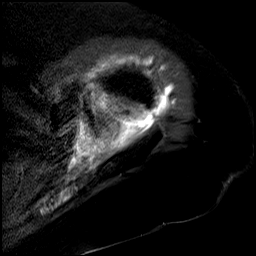
[im 20/20]
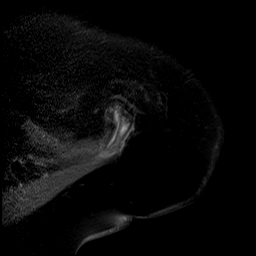

[Series 7: T2 fat-sat · oblique · left · 4.0mm · 0.23mm/px · 8 of 22 slices shown (1 of 3)]
[im 1/22]
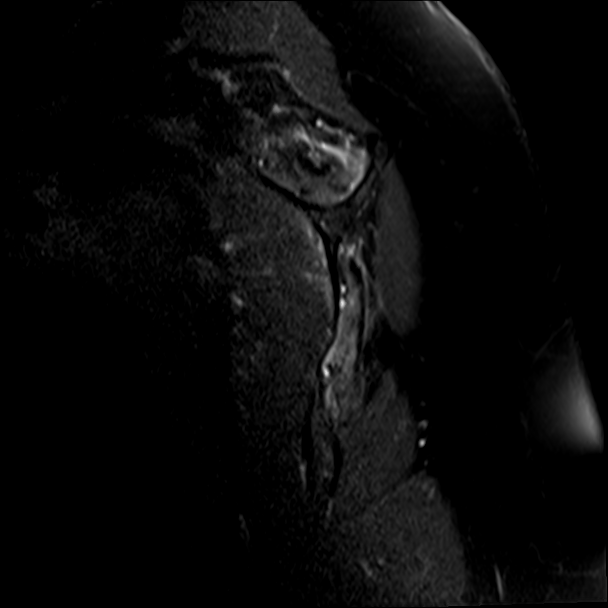
[im 4/22]
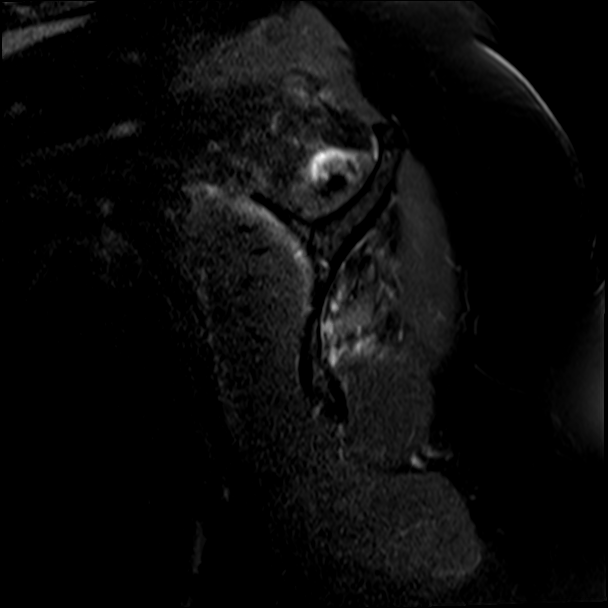
[im 7/22]
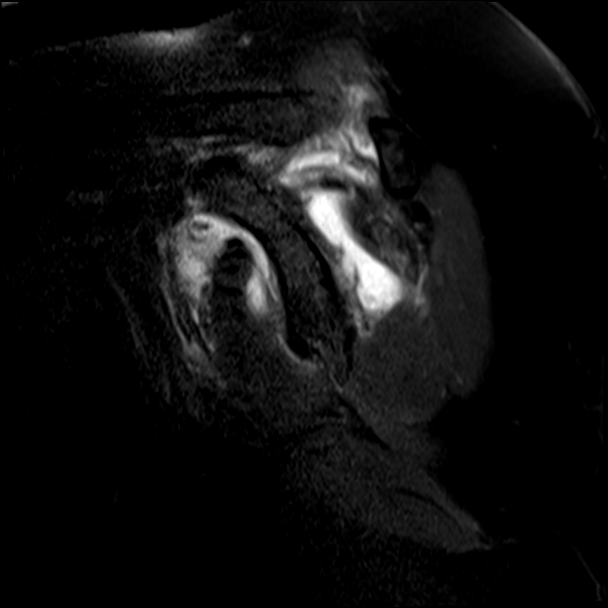
[im 10/22]
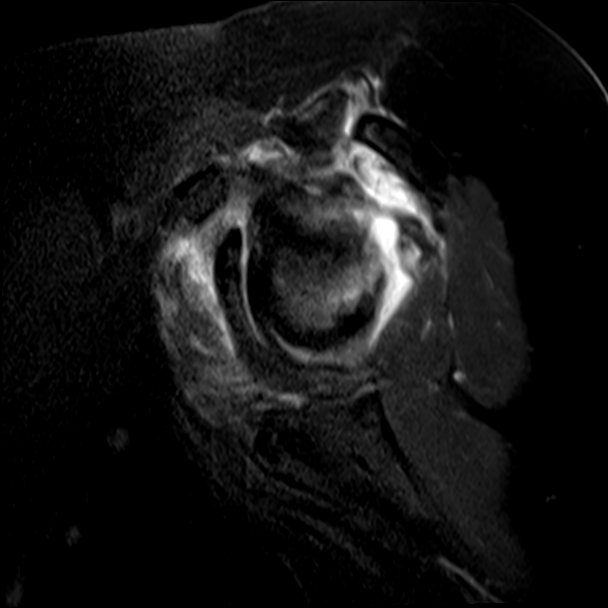
[im 13/22]
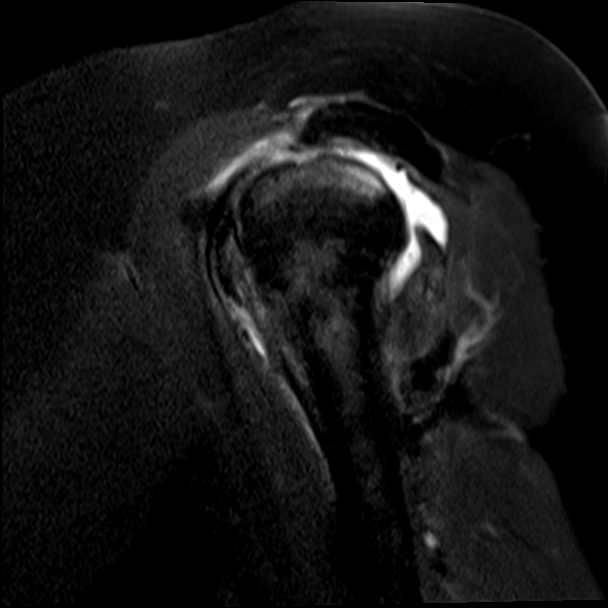
[im 16/22]
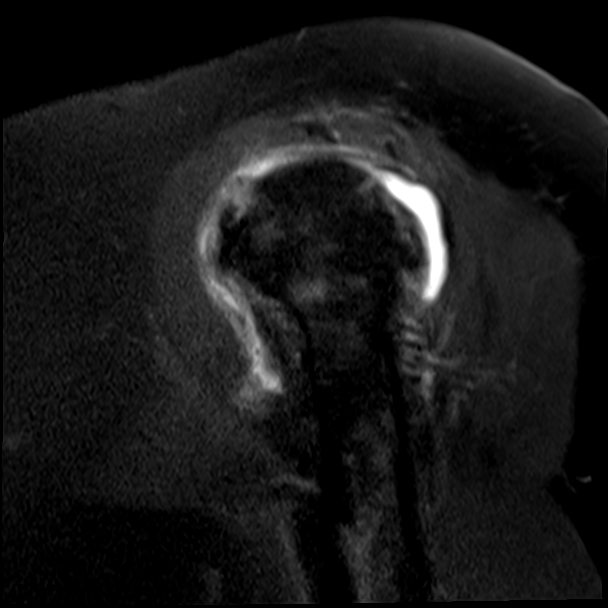
[im 19/22]
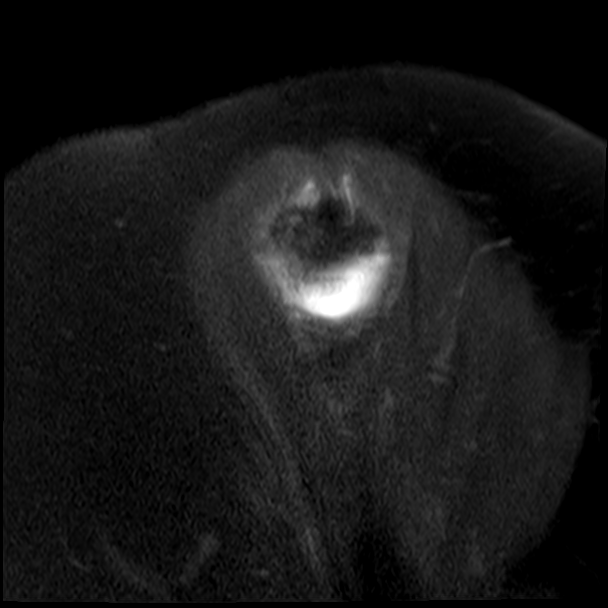
[im 22/22]
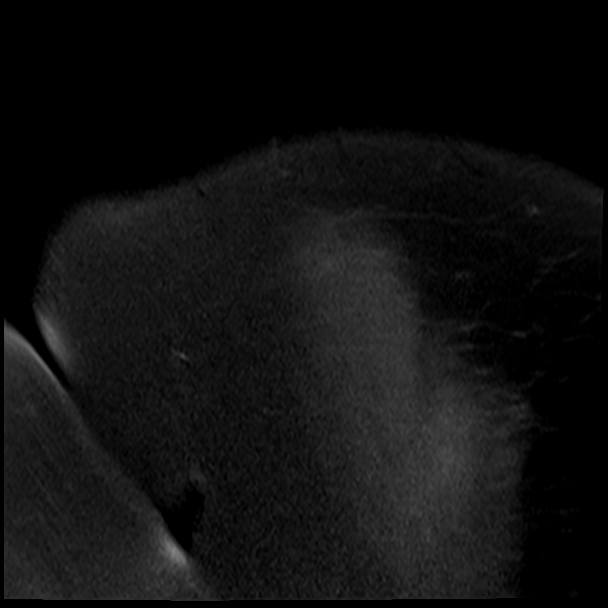

[Series 8: PD fat-sat · oblique · left · 4.0mm · 0.44mm/px · 7 of 18 slices shown (2 of 2)]
[im 1/18]
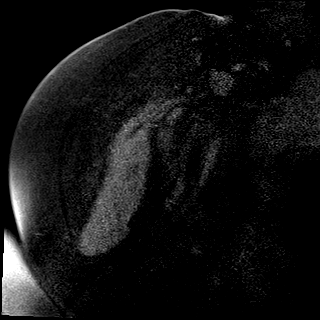
[im 3/18]
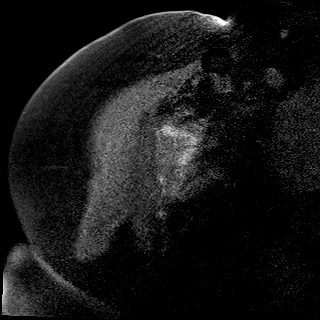
[im 6/18]
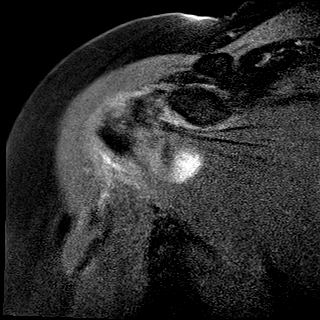
[im 9/18]
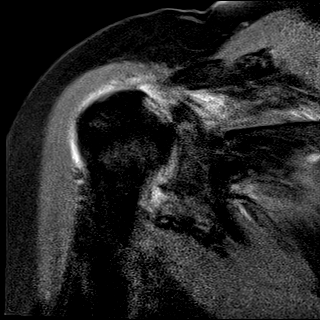
[im 12/18]
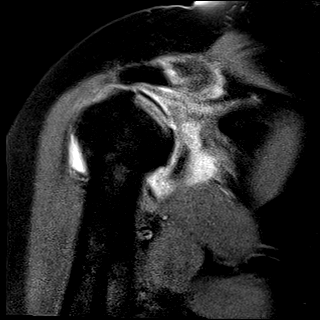
[im 15/18]
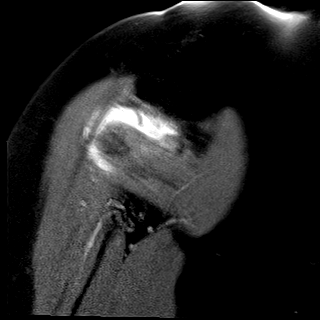
[im 18/18]
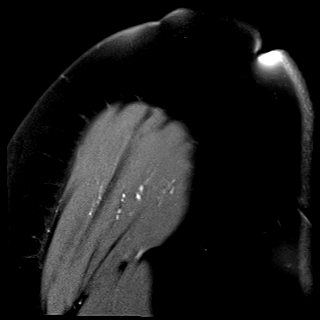

[Series 9: T2 fat-sat · oblique · left · 4.0mm · 0.44mm/px · 7 of 18 slices shown (2 of 3)]
[im 1/18]
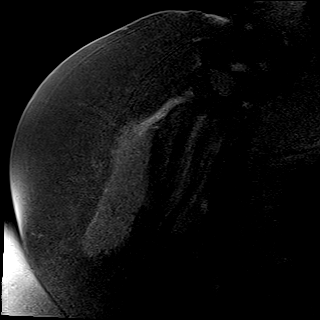
[im 3/18]
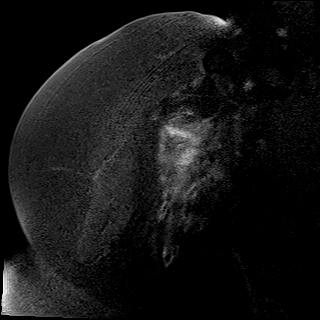
[im 6/18]
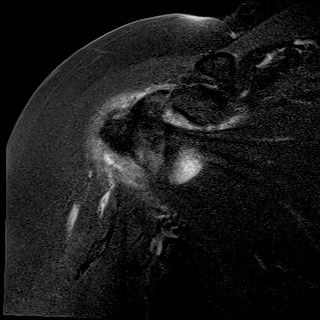
[im 9/18]
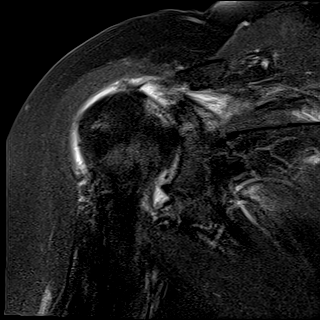
[im 12/18]
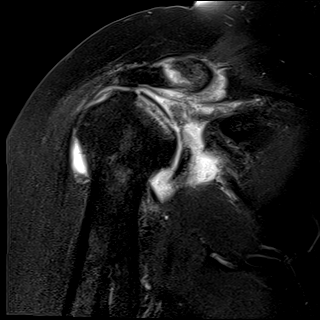
[im 15/18]
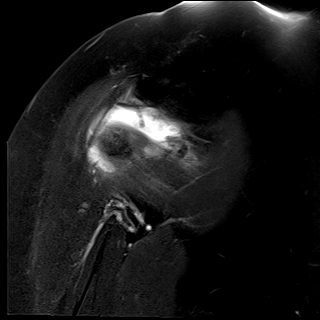
[im 18/18]
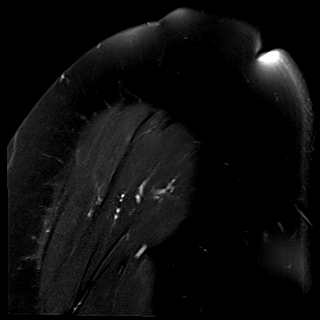

[Series 11: T2 fat-sat · oblique · left · 4.0mm · 0.44mm/px · 1 of 10 slices shown (3 of 3)]
[im 1/10]
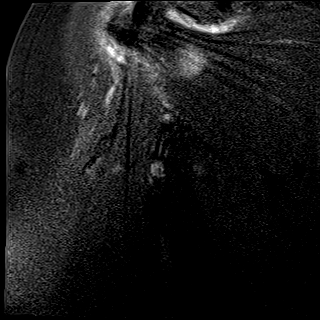

[29 of 40 positions shown; findings below may reference images not displayed]

FINDINGS: The examination is somewhat degraded by patient motion.

Rotator cuff: The supraspinatus and infraspinatus are completely
torn and retracted to the glenoid, 4-5 cm. The subscapularis is
intact with tendinopathy noted. The teres minor is unremarkable.

Muscles: Severe supraspinatus and infraspinatus atrophy. Musculature
is otherwise preserved.

Biceps long head:  Completely torn from the superior labrum.

Acromioclavicular Joint: Moderate osteoarthritis. Type 2 acromion. A
moderate volume of fluid is seen in the subacromial/subdeltoid
bursa.

Glenohumeral Joint: Moderate degenerative change is present. The
humeral head abuts the acromion.

Labrum:  The superior labrum is severely degenerated.

Bones: Fluid undermines a fragment of cortical bone along the
superior aspect of the articular surface of humeral head consistent
with delamination. Delaminated fragment measures 2 cm craniocaudal
by approximately 2 cm transverse. There is mild subchondral edema in
the glenoid.

Other: None.
IMPRESSION: Complete supraspinatus and infraspinatus tendon tears with 4 5 cm of
retraction and marked atrophy.

Complete tear of the long head of biceps from the superior labrum.

Findings consistent with an unstable, delaminated fragment cortical
bone at the superior articular surface of the humeral head measuring
approximately 2 x 2 cm.

Moderate acromioclavicular and glenohumeral osteoarthritis.

Subacromial/subdeltoid bursitis.

Severely degenerated superior labrum.

## 2022-01-22 ENCOUNTER — Other Ambulatory Visit: Payer: Self-pay | Admitting: Internal Medicine

## 2022-01-22 DIAGNOSIS — E119 Type 2 diabetes mellitus without complications: Secondary | ICD-10-CM

## 2022-01-22 DIAGNOSIS — R1011 Right upper quadrant pain: Secondary | ICD-10-CM

## 2022-02-01 ENCOUNTER — Ambulatory Visit
Admission: RE | Admit: 2022-02-01 | Discharge: 2022-02-01 | Disposition: A | Discharge status: Home / Self Care | Payer: Medicare Other | Source: Ambulatory Visit | Attending: Internal Medicine | Admitting: Internal Medicine

## 2022-02-01 DIAGNOSIS — E119 Type 2 diabetes mellitus without complications: Secondary | ICD-10-CM | POA: Diagnosis present

## 2022-02-01 DIAGNOSIS — R1011 Right upper quadrant pain: Secondary | ICD-10-CM | POA: Diagnosis present

## 2022-02-01 LAB — POCT I-STAT CREATININE: Creatinine, Ser: 1.2 mg/dL — ABNORMAL HIGH (ref 0.44–1.00)

## 2022-02-01 MED ORDER — IOHEXOL 300 MG/ML  SOLN
100.0000 mL | Freq: Once | INTRAMUSCULAR | Status: AC | PRN
  Administered 2022-02-01: 100 mL via INTRAVENOUS

## 2022-02-27 ENCOUNTER — Encounter: Payer: Self-pay | Admitting: Podiatry

## 2022-02-27 ENCOUNTER — Ambulatory Visit (INDEPENDENT_AMBULATORY_CARE_PROVIDER_SITE_OTHER): Payer: Medicare Other | Admitting: Podiatry

## 2022-02-27 DIAGNOSIS — B351 Tinea unguium: Secondary | ICD-10-CM

## 2022-02-27 DIAGNOSIS — M79675 Pain in left toe(s): Secondary | ICD-10-CM

## 2022-02-27 DIAGNOSIS — M79674 Pain in right toe(s): Secondary | ICD-10-CM

## 2022-02-27 NOTE — Progress Notes (Signed)
   SUBJECTIVE Patient presents to office today complaining of elongated, thickened nails that cause pain while ambulating in shoes.  Patient is unable to trim their own nails. Patient is here for further evaluation and treatment.  Past Medical History:  Diagnosis Date   Anemia    Asthma    Chronic airway obstruction (HCC)    Chronic kidney disease    F/U WITH DR LATEEF   Diabetes mellitus without complication (HCC)    Dyspnea    WITH EXERTION   GERD (gastroesophageal reflux disease)    Heart murmur    ASYMPTOMATIC   History of adenomatous polyp of colon    History of bone density study    Hyperlipidemia    Hypertension    Idiopathic peripheral neuropathy    Morbid obesity (HCC)    40.0-44.9   Osteoporosis    Plantar fascial fibromatosis    Pulmonary fibrosis (HCC)    Sleep apnea    TIA (transient ischemic attack)     OBJECTIVE General Patient is awake, alert, and oriented x 3 and in no acute distress. Derm Skin is dry and supple bilateral. Negative open lesions or macerations. Remaining integument unremarkable. Nails are tender, long, thickened and dystrophic with subungual debris, consistent with onychomycosis, 1-5 bilateral. No signs of infection noted. Vasc  DP and PT pedal pulses palpable bilaterally. Temperature gradient within normal limits.  Neuro Epicritic and protective threshold sensation grossly intact bilaterally.  Musculoskeletal Exam No symptomatic pedal deformities noted bilateral. Muscular strength within normal limits.  ASSESSMENT 1.  Pain due to onychomycosis of toenails both  PLAN OF CARE 1. Patient evaluated today.  2. Instructed to maintain good pedal hygiene and foot care.  3. Mechanical debridement of nails 1-5 bilaterally performed using a nail nipper. Filed with dremel without incident.  4. Return to clinic in 3 mos.    Edrick Kins, DPM [facility]  Dr. Edrick Kins, DPM    [address], [place] [address]                Office [phone]  Fax [phone]

## 2022-04-30 ENCOUNTER — Other Ambulatory Visit: Payer: Self-pay | Admitting: Internal Medicine

## 2022-04-30 DIAGNOSIS — Z1231 Encounter for screening mammogram for malignant neoplasm of breast: Secondary | ICD-10-CM

## 2022-05-24 ENCOUNTER — Ambulatory Visit
Admission: RE | Admit: 2022-05-24 | Discharge: 2022-05-24 | Disposition: A | Discharge status: Home / Self Care | Payer: Medicare Other | Source: Ambulatory Visit | Attending: Internal Medicine | Admitting: Internal Medicine

## 2022-05-24 DIAGNOSIS — Z1231 Encounter for screening mammogram for malignant neoplasm of breast: Secondary | ICD-10-CM | POA: Diagnosis present

## 2022-06-07 ENCOUNTER — Other Ambulatory Visit: Payer: Self-pay | Admitting: Family Medicine

## 2022-06-07 DIAGNOSIS — R9389 Abnormal findings on diagnostic imaging of other specified body structures: Secondary | ICD-10-CM

## 2022-06-26 ENCOUNTER — Ambulatory Visit
Admission: RE | Admit: 2022-06-26 | Discharge: 2022-06-26 | Disposition: A | Discharge status: Home / Self Care | Payer: Medicare Other | Source: Ambulatory Visit | Attending: Family Medicine | Admitting: Family Medicine

## 2022-06-26 ENCOUNTER — Other Ambulatory Visit: Payer: Self-pay | Admitting: Family Medicine

## 2022-06-26 DIAGNOSIS — R9389 Abnormal findings on diagnostic imaging of other specified body structures: Secondary | ICD-10-CM

## 2022-06-27 ENCOUNTER — Ambulatory Visit
Admission: RE | Admit: 2022-06-27 | Discharge: 2022-06-27 | Disposition: A | Discharge status: Home / Self Care | Payer: Medicare Other | Source: Ambulatory Visit | Attending: Family Medicine | Admitting: Family Medicine

## 2022-06-27 DIAGNOSIS — R9389 Abnormal findings on diagnostic imaging of other specified body structures: Secondary | ICD-10-CM | POA: Insufficient documentation

## 2022-06-27 MED ORDER — IOHEXOL 300 MG/ML  SOLN
75.0000 mL | Freq: Once | INTRAMUSCULAR | Status: AC | PRN
  Administered 2022-06-27: 75 mL via INTRAVENOUS

## 2022-07-02 ENCOUNTER — Other Ambulatory Visit: Payer: Self-pay | Admitting: Gastroenterology

## 2022-07-02 ENCOUNTER — Other Ambulatory Visit (HOSPITAL_COMMUNITY): Payer: Self-pay | Admitting: Gastroenterology

## 2022-07-02 DIAGNOSIS — K862 Cyst of pancreas: Secondary | ICD-10-CM

## 2022-07-10 ENCOUNTER — Other Ambulatory Visit: Payer: Self-pay | Admitting: Gastroenterology

## 2022-07-10 ENCOUNTER — Ambulatory Visit
Admission: RE | Admit: 2022-07-10 | Discharge: 2022-07-10 | Disposition: A | Discharge status: Home / Self Care | Payer: Medicare Other | Source: Ambulatory Visit | Attending: Gastroenterology | Admitting: Gastroenterology

## 2022-07-10 DIAGNOSIS — K862 Cyst of pancreas: Secondary | ICD-10-CM | POA: Diagnosis present

## 2022-07-10 MED ORDER — GADOBUTROL 1 MMOL/ML IV SOLN
7.0000 mL | Freq: Once | INTRAVENOUS | Status: AC | PRN
  Administered 2022-07-10: 7 mL via INTRAVENOUS

## 2022-09-05 ENCOUNTER — Encounter: Payer: Self-pay | Admitting: Internal Medicine

## 2022-09-12 ENCOUNTER — Ambulatory Visit (INDEPENDENT_AMBULATORY_CARE_PROVIDER_SITE_OTHER): Payer: Medicare Other | Admitting: Urology

## 2022-09-12 VITALS — BP 147/70 | Ht 59.0 in | Wt 164.0 lb

## 2022-09-12 DIAGNOSIS — R1031 Right lower quadrant pain: Secondary | ICD-10-CM

## 2022-09-12 DIAGNOSIS — N200 Calculus of kidney: Secondary | ICD-10-CM

## 2022-09-12 LAB — BLADDER SCAN AMB NON-IMAGING

## 2022-09-12 NOTE — Progress Notes (Signed)
09/12/2022 9:24 AM   [name] [name] 02/03/1951 [id number]  Referring provider: Peggye Form, NP [place],  [place] [address]  Chief Complaint  Patient presents with   Follow-up    [facility]     HPI: 71 year old female referred for possible evaluation of kidney stones.  She reports that she has had some chronic pain in her right lower quadrant.  She broke several ribs and wonders if it is related.  The pain is primarily positional with lifting bending and stooping.  It is not constant.  She had a CT scan 7 months ago, CT abdomen pelvis with contrast which indicates no GU pathology or stones.  She denies a personal history of kidney stones.  She denies any urinary issues, urgency frequency incontinence, dysuria or gross hematuria.  She has had a urinalysis by her PCP recently which was negative.   PMH: Past Medical History:  Diagnosis Date   Anemia    Asthma    Chronic airway obstruction (HCC)    Chronic kidney disease    F/U WITH DR LATEEF   Diabetes mellitus without complication (HCC)    Dyspnea    WITH EXERTION   GERD (gastroesophageal reflux disease)    Heart murmur    ASYMPTOMATIC   History of adenomatous polyp of colon    History of bone density study    Hyperlipidemia    Hypertension    Idiopathic peripheral neuropathy    Morbid obesity (HCC)    40.0-44.9   Osteoporosis    Plantar fascial fibromatosis    Pulmonary fibrosis (HCC)    Sleep apnea    TIA (transient ischemic attack)     Surgical History: Past Surgical History:  Procedure Laterality Date   ABDOMINAL HYSTERECTOMY     BACK SURGERY     BLADDER SUSPENSION     CESAREAN SECTION     COLONOSCOPY     COLONOSCOPY N/A 06/29/2021   Procedure: COLONOSCOPY;  Surgeon: Toledo, Benay Pike, MD;  Location: [facility]COPY;  Service: Gastroenterology;  Laterality: N/A;   COLONOSCOPY WITH PROPOFOL N/A 08/30/2020   Procedure: COLONOSCOPY WITH PROPOFOL;  Surgeon: Lesly Rubenstein, MD;  Location: [facility];  Service: Endoscopy;  Laterality: N/A;   DILATION AND CURETTAGE OF UTERUS     ESOPHAGOGASTRODUODENOSCOPY     ESOPHAGOGASTRODUODENOSCOPY N/A 06/29/2021   Procedure: ESOPHAGOGASTRODUODENOSCOPY (EGD);  Surgeon: Toledo, Benay Pike, MD;  Location: [facility];  Service: Gastroenterology;  Laterality: N/A;   ESOPHAGOGASTRODUODENOSCOPY (EGD) WITH PROPOFOL N/A 08/30/2020   Procedure: ESOPHAGOGASTRODUODENOSCOPY (EGD) WITH PROPOFOL;  Surgeon: Lesly Rubenstein, MD;  Location: [facility];  Service: Endoscopy;  Laterality: N/A;   LUMBAR LAMINECTOMY/DECOMPRESSION MICRODISCECTOMY N/A 10/15/2016   Procedure: L3-L5 Laminectomy ;  Surgeon: Deetta Perla, MD;  Location: [facility];  Service: Neurosurgery;  Laterality: N/A;   LUMBAR WOUND DEBRIDEMENT N/A 11/05/2016   Procedure: LUMBAR WOUND DEBRIDEMENT;  Surgeon: Deetta Perla, MD;  Location: [facility];  Service: Neurosurgery;  Laterality: N/A;   REVERSE SHOULDER ARTHROPLASTY Right 05/05/2020   Procedure: REVERSE SHOULDER ARTHROPLASTY;  Surgeon: Corky Mull, MD;  Location: [facility];  Service: Orthopedics;  Laterality: Right;   REVERSE SHOULDER ARTHROPLASTY Left 02/23/2021   Procedure: REVERSE SHOULDER ARTHROPLASTY;  Surgeon: Corky Mull, MD;  Location: [facility];  Service: Orthopedics;  Laterality: Left;   TUBAL LIGATION     VEIN LIGATION AND STRIPPING      Home Medications:  Allergies as of 09/12/2022       Reactions  Codeine Nausea And Vomiting   Furosemide Other (See Comments)   Penicillin V Potassium    Other reaction(s): Unknown   Quinine    Other reaction(s): Unknown   Tramadol    Other reaction(s): Unknown   Tylenol [acetaminophen]    Tylenol-Codeine: Dizziness        Medication List        Accurate as of September 12, 2022  9:24 AM. If you have any questions, ask your nurse or doctor.          albuterol 108 (90 Base) MCG/ACT inhaler Commonly known as: VENTOLIN HFA Inhale 2 puffs into  the lungs every 6 (six) hours as needed for wheezing or shortness of breath.   clotrimazole-betamethasone cream Commonly known as: LOTRISONE Apply 1 application topically 2 (two) times daily as needed (itching).   Ferrex 150 150 MG capsule Generic drug: iron polysaccharides Take 150 mg by mouth daily.   ferric citrate 1 GM 210 MG(Fe) tablet Commonly known as: AURYXIA Take 420 mg by mouth 3 (three) times daily with meals.   ferrous sulfate 325 (65 FE) MG tablet Take 1 tablet (325 mg total) by mouth 2 (two) times daily with a meal.   fluticasone-salmeterol 115-21 MCG/ACT inhaler Commonly known as: ADVAIR HFA Inhale 2 puffs into the lungs 2 (two) times daily.   hydrocortisone 2.5 % cream Apply 1 application topically 2 (two) times daily. (Apply to face as spot treatment)   ketoconazole 2 % cream Commonly known as: NIZORAL Apply 1 application topically 2 (two) times daily. (Apply to areas on face)   losartan 50 MG tablet Commonly known as: COZAAR Take 50 mg by mouth every morning.   mometasone-formoterol 100-5 MCG/ACT Aero Commonly known as: DULERA Inhale 2 puffs into the lungs 2 (two) times daily as needed for wheezing or shortness of breath.   oxyCODONE 5 MG immediate release tablet Commonly known as: Oxy IR/ROXICODONE Take 1-2 tablets (5-10 mg total) by mouth every 4 (four) hours as needed for moderate pain. What changed:  how much to take when to take this reasons to take this   pantoprazole 40 MG tablet Commonly known as: Protonix Take 1 tablet (40 mg total) by mouth daily.   Voltaren 1 % Gel Generic drug: diclofenac Sodium Apply topically 4 (four) times daily.        Allergies:  Allergies  Allergen Reactions   Codeine Nausea And Vomiting   Furosemide Other (See Comments)   Penicillin V Potassium     Other reaction(s): Unknown   Quinine     Other reaction(s): Unknown   Tramadol     Other reaction(s): Unknown   Tylenol [Acetaminophen]      Tylenol-Codeine: Dizziness    Family History: Family History  Problem Relation Age of Onset   Stroke Mother    Heart attack Mother    Breast cancer Sister 81   Stroke Sister    Heart attack Sister    Breast cancer Cousin        maternal side 39's    Social History:  reports that she quit smoking about 22 years ago. Her smoking use included cigarettes. She has a 90.00 pack-year smoking history. She has never used smokeless tobacco. She reports that she does not drink alcohol and does not use drugs.   Physical Exam: BP (!) 147/70   Ht '4\' 11"'$  (1.499 m)   Wt 164 lb (74.4 kg)   BMI 33.12 kg/m   Constitutional:  Alert and oriented, No acute  distress. HEENT: [place] AT, moist mucus membranes.  Trachea midline, no masses. Cardiovascular: No clubbing, cyanosis, or edema. Skin: No rashes, bruises or suspicious lesions. Neurologic: Grossly intact, no focal deficits, moving all 4 extremities. Psychiatric: Normal mood and affect.  Laboratory Data: Lab Results  Component Value Date   WBC 9.7 06/29/2021   HGB 7.5 (L) 06/30/2021   HCT 23.7 (L) 06/30/2021   MCV 76.1 (L) 06/29/2021   PLT 345 06/29/2021    Lab Results  Component Value Date   CREATININE 1.20 (H) 02/01/2022    Lab Results  Component Value Date   HGBA1C 5.6 06/28/2021    Urinalysis    Component Value Date/Time   COLORURINE YELLOW 06/27/2021 1824   APPEARANCEUR CLEAR 06/27/2021 1824   LABSPEC 1.015 06/27/2021 1824   PHURINE 6.0 06/27/2021 1824   GLUCOSEU NEGATIVE 06/27/2021 1824   HGBUR NEGATIVE 06/27/2021 1824   BILIRUBINUR NEGATIVE 06/27/2021 1824   KETONESUR NEGATIVE 06/27/2021 1824   PROTEINUR NEGATIVE 06/27/2021 1824   NITRITE NEGATIVE 06/27/2021 1824   LEUKOCYTESUR SMALL (A) 06/27/2021 1824    Lab Results  Component Value Date   BACTERIA RARE (A) 06/27/2021    Pertinent Imaging:  IMPRESSION: 1. No acute abnormality or explanation for right upper quadrant pain. 2. Colonic diverticulosis without  acute inflammation. 3. Multiple small pancreatic cysts, have been evaluated with MRI. No obvious interval change by CT. MRI follow-up of the cysts has been recommended. 4. Bilateral renal cysts, simple cysts were confirmed on prior MRI.   Aortic Atherosclerosis (ICD10-I70.0).     Electronically Signed   By: Keith Rake M.D.   On: 02/01/2022 20:16  Personally reviewed the above CT scan, no evidence of GU pathology or any kidney stones.  Assessment & Plan:    1.  Right lower quadrant pain Chronic, positional likely musculoskeletal  Reviewed previous imaging, no evidence of kidney stones is contributing factor  I reached out to the referring provider, Eulas Post fields for further clarification.  The patient denies personal history of kidney stones and has no documented stones on imaging.  - Urinalysis, Complete - Bladder Scan (Post Void Residual) in office  F/u prn  Hollice Espy, MD  [place] [address], [place], [place] [address] [phone]

## 2022-10-04 ENCOUNTER — Other Ambulatory Visit: Payer: Self-pay | Admitting: Internal Medicine

## 2022-10-04 DIAGNOSIS — M5416 Radiculopathy, lumbar region: Secondary | ICD-10-CM

## 2022-10-04 DIAGNOSIS — M549 Dorsalgia, unspecified: Secondary | ICD-10-CM

## 2022-10-12 ENCOUNTER — Encounter: Payer: Self-pay | Admitting: Internal Medicine

## 2022-10-16 ENCOUNTER — Ambulatory Visit
Admission: RE | Admit: 2022-10-16 | Discharge: 2022-10-16 | Disposition: A | Discharge status: Home / Self Care | Payer: 59 | Source: Ambulatory Visit | Attending: Internal Medicine | Admitting: Internal Medicine

## 2022-10-16 DIAGNOSIS — M5416 Radiculopathy, lumbar region: Secondary | ICD-10-CM

## 2022-10-16 DIAGNOSIS — M549 Dorsalgia, unspecified: Secondary | ICD-10-CM | POA: Diagnosis present

## 2022-10-16 MED ORDER — GADOBUTROL 1 MMOL/ML IV SOLN
7.5000 mL | Freq: Once | INTRAVENOUS | Status: AC | PRN
  Administered 2022-10-16: 7.5 mL via INTRAVENOUS

## 2022-11-23 ENCOUNTER — Other Ambulatory Visit: Payer: Self-pay | Admitting: *Deleted

## 2022-11-23 DIAGNOSIS — N1832 Chronic kidney disease, stage 3b: Secondary | ICD-10-CM

## 2022-11-29 ENCOUNTER — Inpatient Hospital Stay: Payer: 59 | Attending: Internal Medicine

## 2022-11-29 DIAGNOSIS — D631 Anemia in chronic kidney disease: Secondary | ICD-10-CM | POA: Diagnosis present

## 2022-11-29 DIAGNOSIS — N183 Chronic kidney disease, stage 3 unspecified: Secondary | ICD-10-CM | POA: Insufficient documentation

## 2022-11-29 LAB — CBC WITH DIFFERENTIAL (CANCER CENTER ONLY)
Abs Immature Granulocytes: 0.02 10*3/uL (ref 0.00–0.07)
Basophils Absolute: 0.1 10*3/uL (ref 0.0–0.1)
Basophils Relative: 1 %
Eosinophils Absolute: 0.3 10*3/uL (ref 0.0–0.5)
Eosinophils Relative: 4 %
HCT: 27.6 % — ABNORMAL LOW (ref 36.0–46.0)
Hemoglobin: 8 g/dL — ABNORMAL LOW (ref 12.0–15.0)
Immature Granulocytes: 0 %
Lymphocytes Relative: 19 %
Lymphs Abs: 1.3 10*3/uL (ref 0.7–4.0)
MCH: 26.2 pg (ref 26.0–34.0)
MCHC: 29 g/dL — ABNORMAL LOW (ref 30.0–36.0)
MCV: 90.5 fL (ref 80.0–100.0)
Monocytes Absolute: 0.6 10*3/uL (ref 0.1–1.0)
Monocytes Relative: 9 %
Neutro Abs: 4.9 10*3/uL (ref 1.7–7.7)
Neutrophils Relative %: 67 %
Platelet Count: 338 10*3/uL (ref 150–400)
RBC: 3.05 MIL/uL — ABNORMAL LOW (ref 3.87–5.11)
RDW: 13.8 % (ref 11.5–15.5)
WBC Count: 7.2 10*3/uL (ref 4.0–10.5)
nRBC: 0 % (ref 0.0–0.2)

## 2022-11-29 LAB — FERRITIN: Ferritin: 24 ng/mL (ref 11–307)

## 2022-11-29 LAB — IRON AND TIBC
Iron: 313 ug/dL — ABNORMAL HIGH (ref 28–170)
Saturation Ratios: 72 % — ABNORMAL HIGH (ref 10.4–31.8)
TIBC: 437 ug/dL (ref 250–450)
UIBC: 124 ug/dL

## 2022-11-29 LAB — SAMPLE TO BLOOD BANK

## 2022-11-30 ENCOUNTER — Inpatient Hospital Stay (HOSPITAL_BASED_OUTPATIENT_CLINIC_OR_DEPARTMENT_OTHER): Payer: 59 | Admitting: Nurse Practitioner

## 2022-11-30 ENCOUNTER — Inpatient Hospital Stay: Payer: 59

## 2022-11-30 ENCOUNTER — Encounter: Payer: Self-pay | Admitting: Nurse Practitioner

## 2022-11-30 VITALS — BP 126/49 | HR 76 | Temp 97.8°F | Ht 59.0 in | Wt 166.0 lb

## 2022-11-30 VITALS — BP 116/60 | HR 65 | Resp 18

## 2022-11-30 DIAGNOSIS — N1831 Chronic kidney disease, stage 3a: Secondary | ICD-10-CM | POA: Diagnosis not present

## 2022-11-30 DIAGNOSIS — D509 Iron deficiency anemia, unspecified: Secondary | ICD-10-CM

## 2022-11-30 DIAGNOSIS — D631 Anemia in chronic kidney disease: Secondary | ICD-10-CM

## 2022-11-30 DIAGNOSIS — N183 Chronic kidney disease, stage 3 unspecified: Secondary | ICD-10-CM | POA: Diagnosis not present

## 2022-11-30 MED ORDER — SODIUM CHLORIDE 0.9 % IV SOLN
Freq: Once | INTRAVENOUS | Status: AC
  Filled 2022-11-30: qty 250

## 2022-11-30 MED ORDER — SODIUM CHLORIDE 0.9 % IV SOLN
200.0000 mg | Freq: Once | INTRAVENOUS | Status: AC
  Administered 2022-11-30: 200 mg via INTRAVENOUS
  Filled 2022-11-30: qty 200

## 2022-11-30 NOTE — Progress Notes (Signed)
[facility] CONSULT NOTE  Patient Care Team: Barbette Reichmann, MD as PCP - General (Internal Medicine) Earna Coder, MD as Consulting Physician (Hematology and Oncology)  CHIEF COMPLAINTS/PURPOSE OF CONSULTATION: Anemia   HEMATOLOGY HISTORY  #Chronic kidney disease-stage III/ ANEMIA: EGD [~10 y]/colonoscopy-postponed in april;[March 2021- Hb ~10.3; Iron sat- 15%] ; recommend PO iron; HOLD IV Iron.   # CKD [Dr.Lateef]-GFR 40s.    HISTORY OF PRESENTING ILLNESS:  [name] [name] 72 y.o.  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 Donneta Romberg 04/04/20 who returns to clinic for rereferral from Dr Marcello Fennel for anemia. She was seen by PCP for nosebleed last week. She's been referred to ENT but not yet seen them. She was noted to have progressive anemia and right sided abdominal pain which has now resolved. She's been referred to GI for workup and saw [employer] on 11/13/22. Ferritin was normal. She's been taking oral iron pills and tolerating well. No abdominal pain or dyspepsia today. Feels tired. Short of breath with exertion. Denies black or bloody stools. She has chronic back pain.    Review of Systems  Constitutional:  Positive for malaise/fatigue. Negative for chills, diaphoresis, fever and weight loss.  HENT:  Negative for nosebleeds and sore throat.   Eyes:  Negative for double vision.  Respiratory:  Negative for cough, hemoptysis, sputum production, shortness of breath and wheezing.   Cardiovascular:  Negative for chest pain, palpitations, orthopnea and leg swelling.  Gastrointestinal:  Negative for abdominal pain, blood in stool, constipation, diarrhea, heartburn, melena, nausea and vomiting.  Genitourinary:  Negative for dysuria, frequency and urgency.  Musculoskeletal:  Positive for back pain and joint pain.  Skin: Negative.  Negative for itching and rash.  Neurological:  Negative for  dizziness, tingling, focal weakness, weakness and headaches.  Endo/Heme/Allergies:  Does not bruise/bleed easily.  Psychiatric/Behavioral:  Negative for depression. The patient is not nervous/anxious and does not have insomnia.     MEDICAL HISTORY:  Past Medical History:  Diagnosis Date   Anemia    Asthma    Chronic airway obstruction (HCC)    Chronic kidney disease    F/U WITH DR LATEEF   Diabetes mellitus without complication (HCC)    Dyspnea    WITH EXERTION   GERD (gastroesophageal reflux disease)    Heart murmur    ASYMPTOMATIC   History of adenomatous polyp of colon    History of bone density study    Hyperlipidemia    Hypertension    Idiopathic peripheral neuropathy    Morbid obesity (HCC)    40.0-44.9   Osteoporosis    Plantar fascial fibromatosis    Pulmonary fibrosis (HCC)    Sleep apnea    TIA (transient ischemic attack)     SURGICAL HISTORY: Past Surgical History:  Procedure Laterality Date   ABDOMINAL HYSTERECTOMY     BACK SURGERY     BLADDER SUSPENSION     CESAREAN SECTION     COLONOSCOPY     COLONOSCOPY N/A 06/29/2021   Procedure: COLONOSCOPY;  Surgeon: Toledo, Boykin Nearing, MD;  Location: [facility];  Service: Gastroenterology;  Laterality: N/A;   COLONOSCOPY WITH PROPOFOL N/A 08/30/2020   Procedure: COLONOSCOPY WITH PROPOFOL;  Surgeon: Regis Bill, MD;  Location: [facility];  Service: Endoscopy;  Laterality: N/A;   DILATION AND CURETTAGE OF UTERUS     ESOPHAGOGASTRODUODENOSCOPY     ESOPHAGOGASTRODUODENOSCOPY N/A 06/29/2021  Procedure: ESOPHAGOGASTRODUODENOSCOPY (EGD);  Surgeon: Toledo, Boykin Nearing, MD;  Location: [facility];  Service: Gastroenterology;  Laterality: N/A;   ESOPHAGOGASTRODUODENOSCOPY (EGD) WITH PROPOFOL N/A 08/30/2020   Procedure: ESOPHAGOGASTRODUODENOSCOPY (EGD) WITH PROPOFOL;  Surgeon: Regis Bill, MD;  Location: [facility];  Service: Endoscopy;  Laterality: N/A;   LUMBAR LAMINECTOMY/DECOMPRESSION  MICRODISCECTOMY N/A 10/15/2016   Procedure: L3-L5 Laminectomy ;  Surgeon: Lucy Chris, MD;  Location: [facility];  Service: Neurosurgery;  Laterality: N/A;   LUMBAR WOUND DEBRIDEMENT N/A 11/05/2016   Procedure: LUMBAR WOUND DEBRIDEMENT;  Surgeon: Lucy Chris, MD;  Location: [facility];  Service: Neurosurgery;  Laterality: N/A;   REVERSE SHOULDER ARTHROPLASTY Right 05/05/2020   Procedure: REVERSE SHOULDER ARTHROPLASTY;  Surgeon: Christena Flake, MD;  Location: [facility];  Service: Orthopedics;  Laterality: Right;   REVERSE SHOULDER ARTHROPLASTY Left 02/23/2021   Procedure: REVERSE SHOULDER ARTHROPLASTY;  Surgeon: Christena Flake, MD;  Location: [facility];  Service: Orthopedics;  Laterality: Left;   TUBAL LIGATION     VEIN LIGATION AND STRIPPING      SOCIAL HISTORY: Social History   Socioeconomic History   Marital status: Single    Spouse name: Not on file   Number of children: Not on file   Years of education: Not on file   Highest education level: Not on file  Occupational History   Not on file  Tobacco Use   Smoking status: Former    Packs/day: 3.00    Years: 30.00    Total pack years: 90.00    Types: Cigarettes    Quit date: 04/28/2000    Years since quitting: 22.6   Smokeless tobacco: Never  Vaping Use   Vaping Use: Never used  Substance and Sexual Activity   Alcohol use: No   Drug use: No   Sexual activity: Not on file  Other Topics Concern   Not on file  Social History Narrative   Lives in [place]; by self; walks with walker/gait instability; quit smoking 20 years ago; no alcohol.    Social Determinants of Health   Financial Resource Strain: Not on file  Food Insecurity: Not on file  Transportation Needs: Not on file  Physical Activity: Not on file  Stress: Not on file  Social Connections: Not on file  Intimate Partner Violence: Not on file    FAMILY HISTORY: Family History  Problem Relation Age of Onset   Stroke Mother    Heart attack Mother    Breast cancer  Sister 26   Stroke Sister    Heart attack Sister    Breast cancer Cousin        maternal side 60's    ALLERGIES:  is allergic to codeine, furosemide, penicillin v potassium, quinine, tramadol, and tylenol [acetaminophen].  MEDICATIONS:  Current Outpatient Medications  Medication Sig Dispense Refill   clotrimazole-betamethasone (LOTRISONE) cream Apply 1 application topically 2 (two) times daily as needed (itching).      ferrous sulfate 325 (65 FE) MG tablet Take 1 tablet (325 mg total) by mouth 2 (two) times daily with a meal. 60 tablet 1   fluticasone-salmeterol (ADVAIR HFA) 115-21 MCG/ACT inhaler Inhale 2 puffs into the lungs 2 (two) times daily.     hydrocortisone 2.5 % cream Apply 1 application topically 2 (two) times daily. (Apply to face as spot treatment)     losartan (COZAAR) 50 MG tablet Take 50 mg by mouth every morning.      mometasone-formoterol (DULERA) 100-5 MCG/ACT AERO Inhale 2 puffs into the lungs  2 (two) times daily as needed for wheezing or shortness of breath.     oxyCODONE (OXY IR/ROXICODONE) 5 MG immediate release tablet Take 1-2 tablets (5-10 mg total) by mouth every 4 (four) hours as needed for moderate pain. (Patient taking differently: Take 5 mg by mouth 2 (two) times daily as needed for moderate pain or severe pain.) 60 tablet 0   albuterol (VENTOLIN HFA) 108 (90 Base) MCG/ACT inhaler Inhale 2 puffs into the lungs every 6 (six) hours as needed for wheezing or shortness of breath. (Patient not taking: Reported on 11/30/2022)     diclofenac Sodium (VOLTAREN) 1 % GEL Apply topically 4 (four) times daily. (Patient not taking: Reported on 11/30/2022)     ferric citrate (AURYXIA) 1 GM 210 MG(Fe) tablet Take 420 mg by mouth 3 (three) times daily with meals. (Patient not taking: Reported on 11/30/2022)     iron polysaccharides (FERREX 150) 150 MG capsule Take 150 mg by mouth daily. (Patient not taking: Reported on 11/30/2022)     ketoconazole (NIZORAL) 2 % cream Apply 1  application topically 2 (two) times daily. (Apply to areas on face) (Patient not taking: Reported on 11/30/2022)     pantoprazole (PROTONIX) 40 MG tablet Take 1 tablet (40 mg total) by mouth daily. 30 tablet 1   No current facility-administered medications for this visit.    PHYSICAL EXAMINATION: Vitals:   11/30/22 1425  BP: (!) 126/49  Pulse: 76  Temp: 97.8 F (36.6 C)   Filed Weights   11/30/22 1425  Weight: 166 lb (75.3 kg)   Physical Exam Vitals reviewed.  Constitutional:      Appearance: She is not ill-appearing.     Comments: Obese. In a wheelchair. Unaccompanied.   Cardiovascular:     Rate and Rhythm: Normal rate and regular rhythm.  Pulmonary:     Effort: Pulmonary effort is normal. No respiratory distress.     Breath sounds: Normal breath sounds. No wheezing.  Abdominal:     General: There is no distension.     Palpations: Abdomen is soft.     Tenderness: There is no abdominal tenderness. There is no guarding.  Musculoskeletal:        General: No tenderness or deformity.  Skin:    General: Skin is warm and dry.     Coloration: Skin is not pale.  Neurological:     Mental Status: She is alert and oriented to person, place, and time.  Psychiatric:        Mood and Affect: Affect normal. Mood is anxious.        Behavior: Behavior normal.    LABORATORY DATA:  I have reviewed the data as listed Lab Results  Component Value Date   WBC 7.2 11/29/2022   HGB 8.0 (L) 11/29/2022   HCT 27.6 (L) 11/29/2022   MCV 90.5 11/29/2022   PLT 338 11/29/2022   Iron/TIBC/Ferritin/ %Sat    Component Value Date/Time   IRON 313 (H) 11/29/2022 1220   TIBC 437 11/29/2022 1220   FERRITIN 24 11/29/2022 1220   IRONPCTSAT 72 (H) 11/29/2022 1220      Latest Ref Rng & Units 02/01/2022   10:07 AM 06/28/2021    4:41 AM 06/27/2021    3:07 PM  CMP  Glucose 70 - 99 mg/dL  81  546   BUN 8 - 23 mg/dL  26  31   Creatinine 5.68 - 1.00 mg/dL 1.27  5.17  0.01   Sodium 135 - 145 mmol/L  140  139   Potassium 3.5 - 5.1 mmol/L  3.9  3.8   Chloride 98 - 111 mmol/L  109  108   CO2 22 - 32 mmol/L  24  23   Calcium 8.9 - 10.3 mg/dL  8.6  8.6    No results found.  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 Hmg today is 8.0 (was 5.2 in 06/2021). Ferritin 24. Iron sat 72%. Serum iron 313. Hold oral iron and discussed every other day dosing. Discussed that frequent, multiple times a day dosing can cause decreased absorption. Ok to hold for now as we plan for IV iron given significant drop in her hemoglobin. Consider transfusion for hemoglobin < 7. Recommend IV iron for rapid replenishment of iron stores. Discussed risks including allergic reaction or anaphylaxis. Patient agrees to proceed.  Etiology- unclear- recent epistaxis, awaiting ENT eval. Last colonoscopy and endoscopy with Dr. Norma Fredrickson 06/2021. Diverticula noted. Large adenoma. 1cm hiatal hernia. Gastritis. No capsule. She's now being followed by Dr. Mia Creek. Saw [employer] last week, no additional workup recommended given normal ferritin. Plan was to repeat colonoscopy in 2025.  CKD- IIIa. GFR 40-50s-diabetes/hypertension. Followed by nephrology [Dr.Lateef]. Reviewed etiology of anemia in CKD. Recommend optimizing iron stores. If hemoglobin remains < 10, could consider starting epo.  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 Offered counseling or psychiatry but patient declines.    DISPOSITION: Venofer x 5. She'll receive first dose today.  Discussed lab check in 1 month but patient prefers to have with pcp who is checking other labs 3 mo- labs (cbc, cmp, ferritin, iron studies), Dr Donneta Romberg, +/- venofer- la  No problem-specific Assessment & Plan notes found for this encounter.  All questions were answered. The patient knows to call the clinic with any problems, questions or concerns.  Alinda Dooms, NP 11/30/2022

## 2022-12-07 ENCOUNTER — Inpatient Hospital Stay: Payer: 59 | Attending: Internal Medicine

## 2022-12-07 VITALS — BP 109/40 | HR 73 | Temp 98.5°F | Resp 18

## 2022-12-07 DIAGNOSIS — N183 Chronic kidney disease, stage 3 unspecified: Secondary | ICD-10-CM | POA: Diagnosis present

## 2022-12-07 DIAGNOSIS — D631 Anemia in chronic kidney disease: Secondary | ICD-10-CM | POA: Diagnosis present

## 2022-12-07 MED ORDER — SODIUM CHLORIDE 0.9 % IV SOLN
Freq: Once | INTRAVENOUS | Status: AC
  Filled 2022-12-07: qty 250

## 2022-12-07 MED ORDER — SODIUM CHLORIDE 0.9 % IV SOLN
200.0000 mg | Freq: Once | INTRAVENOUS | Status: AC
  Administered 2022-12-07: 200 mg via INTRAVENOUS
  Filled 2022-12-07: qty 200

## 2022-12-07 NOTE — Progress Notes (Signed)
Pt has been educated and understands. Pt refused to stay 30 mins after iron infusion.  Pt had another appt to go to. VS completed.

## 2022-12-13 MED FILL — Iron Sucrose Inj 20 MG/ML (Fe Equiv): INTRAVENOUS | Qty: 10 | Status: AC

## 2022-12-14 ENCOUNTER — Inpatient Hospital Stay: Payer: 59

## 2022-12-14 VITALS — BP 112/83 | HR 76 | Temp 97.6°F

## 2022-12-14 DIAGNOSIS — D631 Anemia in chronic kidney disease: Secondary | ICD-10-CM

## 2022-12-14 DIAGNOSIS — N183 Chronic kidney disease, stage 3 unspecified: Secondary | ICD-10-CM | POA: Diagnosis not present

## 2022-12-14 MED ORDER — SODIUM CHLORIDE 0.9 % IV SOLN
200.0000 mg | Freq: Once | INTRAVENOUS | Status: AC
  Administered 2022-12-14: 200 mg via INTRAVENOUS
  Filled 2022-12-14: qty 200

## 2022-12-14 MED ORDER — SODIUM CHLORIDE 0.9 % IV SOLN
Freq: Once | INTRAVENOUS | Status: AC
  Filled 2022-12-14: qty 250

## 2022-12-14 NOTE — Progress Notes (Signed)
Patient declined to wait the 30 minutes for post infusion observation today. 

## 2022-12-17 ENCOUNTER — Encounter: Payer: Self-pay | Admitting: Internal Medicine

## 2022-12-21 ENCOUNTER — Inpatient Hospital Stay: Payer: 59

## 2022-12-21 VITALS — BP 98/87 | HR 82 | Temp 97.6°F

## 2022-12-21 DIAGNOSIS — N1832 Chronic kidney disease, stage 3b: Secondary | ICD-10-CM

## 2022-12-21 DIAGNOSIS — N183 Chronic kidney disease, stage 3 unspecified: Secondary | ICD-10-CM | POA: Diagnosis not present

## 2022-12-21 MED ORDER — SODIUM CHLORIDE 0.9 % IV SOLN
Freq: Once | INTRAVENOUS | Status: AC
  Filled 2022-12-21: qty 250

## 2022-12-21 MED ORDER — SODIUM CHLORIDE 0.9 % IV SOLN
200.0000 mg | Freq: Once | INTRAVENOUS | Status: AC
  Administered 2022-12-21: 200 mg via INTRAVENOUS
  Filled 2022-12-21: qty 200

## 2022-12-21 NOTE — Patient Instructions (Signed)

## 2022-12-28 ENCOUNTER — Inpatient Hospital Stay: Payer: 59

## 2022-12-28 VITALS — BP 133/43 | HR 69 | Temp 97.1°F | Resp 18

## 2022-12-28 DIAGNOSIS — D631 Anemia in chronic kidney disease: Secondary | ICD-10-CM

## 2022-12-28 DIAGNOSIS — N183 Chronic kidney disease, stage 3 unspecified: Secondary | ICD-10-CM | POA: Diagnosis not present

## 2022-12-28 MED ORDER — SODIUM CHLORIDE 0.9 % IV SOLN
Freq: Once | INTRAVENOUS | Status: AC
  Filled 2022-12-28: qty 250

## 2022-12-28 MED ORDER — SODIUM CHLORIDE 0.9 % IV SOLN
200.0000 mg | Freq: Once | INTRAVENOUS | Status: AC
  Administered 2022-12-28: 200 mg via INTRAVENOUS
  Filled 2022-12-28: qty 200

## 2022-12-28 NOTE — Patient Instructions (Signed)

## 2023-01-11 ENCOUNTER — Ambulatory Visit (INDEPENDENT_AMBULATORY_CARE_PROVIDER_SITE_OTHER): Payer: 59 | Admitting: Podiatry

## 2023-01-11 DIAGNOSIS — B351 Tinea unguium: Secondary | ICD-10-CM | POA: Diagnosis not present

## 2023-01-11 DIAGNOSIS — E119 Type 2 diabetes mellitus without complications: Secondary | ICD-10-CM | POA: Diagnosis not present

## 2023-01-11 DIAGNOSIS — M79675 Pain in left toe(s): Secondary | ICD-10-CM | POA: Diagnosis not present

## 2023-01-11 DIAGNOSIS — M79674 Pain in right toe(s): Secondary | ICD-10-CM | POA: Diagnosis not present

## 2023-01-11 NOTE — Progress Notes (Signed)
   Chief Complaint  Patient presents with   Diabetes    Patient came in today for diabetic foot care, nail and callus trim, left foot 2nd toe has a ulcer on the bottom of it     SUBJECTIVE Patient presents to office today complaining of elongated, thickened nails that cause pain while ambulating in shoes.  Patient is unable to trim their own nails. Patient is here for further evaluation and treatment.  Past Medical History:  Diagnosis Date   Anemia    Asthma    Chronic airway obstruction (HCC)    Chronic kidney disease    F/U WITH DR LATEEF   Diabetes mellitus without complication (HCC)    Dyspnea    WITH EXERTION   GERD (gastroesophageal reflux disease)    Heart murmur    ASYMPTOMATIC   History of adenomatous polyp of colon    History of bone density study    Hyperlipidemia    Hypertension    Idiopathic peripheral neuropathy    Morbid obesity (HCC)    40.0-44.9   Osteoporosis    Plantar fascial fibromatosis    Pulmonary fibrosis (HCC)    Sleep apnea    TIA (transient ischemic attack)     OBJECTIVE General Patient is awake, alert, and oriented x 3 and in no acute distress. Derm Skin is dry and supple bilateral. Negative open lesions or macerations. Remaining integument unremarkable. Nails are tender, long, thickened and dystrophic with subungual debris, consistent with onychomycosis, 1-5 bilateral. No signs of infection noted.  Hyperkeratotic preulcerative calluses also noted bilateral.  No open wounds Vasc  DP and PT pedal pulses palpable bilaterally. Temperature gradient within normal limits.  Neuro Epicritic and protective threshold sensation grossly intact bilaterally.  Musculoskeletal Exam No symptomatic pedal deformities noted bilateral. Muscular strength within normal limits.  ASSESSMENT 1.  Pain due to onychomycosis of toenails both  PLAN OF CARE 1. Patient evaluated today.  Comprehensive diabetic foot exam performed today 2. Instructed to maintain good pedal  hygiene and foot care.  Advised against going barefoot.  Patient admits to going barefoot around the house 3. Mechanical debridement of nails 1-5 bilaterally performed using a nail nipper. Filed with dremel without incident.  4.  Excisional debridement of the hyperkeratotic preulcerative callus tissue was also performed today using a 312 scalpel without incident or bleeding  5.  Return to clinic annually   Felecia Shelling, DPM [facility]  Dr. Felecia Shelling, DPM    [address] [place], [place] [address]                Office [phone]  Fax [phone]

## 2023-01-15 ENCOUNTER — Encounter: Payer: Self-pay | Admitting: Internal Medicine

## 2023-02-28 ENCOUNTER — Inpatient Hospital Stay: Payer: 59

## 2023-03-01 ENCOUNTER — Inpatient Hospital Stay: Payer: 59

## 2023-03-01 ENCOUNTER — Inpatient Hospital Stay: Payer: 59 | Admitting: Internal Medicine

## 2023-03-22 ENCOUNTER — Other Ambulatory Visit: Payer: Self-pay

## 2023-03-22 DIAGNOSIS — D509 Iron deficiency anemia, unspecified: Secondary | ICD-10-CM

## 2023-03-22 DIAGNOSIS — D631 Anemia in chronic kidney disease: Secondary | ICD-10-CM

## 2023-03-25 ENCOUNTER — Encounter: Payer: Self-pay | Admitting: Internal Medicine

## 2023-03-25 ENCOUNTER — Inpatient Hospital Stay: Payer: 59

## 2023-03-25 ENCOUNTER — Inpatient Hospital Stay (HOSPITAL_BASED_OUTPATIENT_CLINIC_OR_DEPARTMENT_OTHER): Payer: 59 | Admitting: Internal Medicine

## 2023-03-25 ENCOUNTER — Inpatient Hospital Stay: Payer: 59 | Attending: Internal Medicine

## 2023-03-25 VITALS — BP 133/58 | HR 66 | Temp 98.2°F | Ht 59.0 in | Wt 172.7 lb

## 2023-03-25 VITALS — BP 122/58 | HR 68

## 2023-03-25 DIAGNOSIS — D649 Anemia, unspecified: Secondary | ICD-10-CM | POA: Diagnosis not present

## 2023-03-25 DIAGNOSIS — E1122 Type 2 diabetes mellitus with diabetic chronic kidney disease: Secondary | ICD-10-CM | POA: Insufficient documentation

## 2023-03-25 DIAGNOSIS — D631 Anemia in chronic kidney disease: Secondary | ICD-10-CM | POA: Insufficient documentation

## 2023-03-25 DIAGNOSIS — Z79899 Other long term (current) drug therapy: Secondary | ICD-10-CM | POA: Insufficient documentation

## 2023-03-25 DIAGNOSIS — N183 Chronic kidney disease, stage 3 unspecified: Secondary | ICD-10-CM | POA: Diagnosis present

## 2023-03-25 DIAGNOSIS — D509 Iron deficiency anemia, unspecified: Secondary | ICD-10-CM

## 2023-03-25 LAB — FERRITIN: Ferritin: 33 ng/mL (ref 11–307)

## 2023-03-25 LAB — CMP (CANCER CENTER ONLY)
ALT: 11 U/L (ref 0–44)
AST: 23 U/L (ref 15–41)
Albumin: 4 g/dL (ref 3.5–5.0)
Alkaline Phosphatase: 76 U/L (ref 38–126)
Anion gap: 8 (ref 5–15)
BUN: 22 mg/dL (ref 8–23)
CO2: 23 mmol/L (ref 22–32)
Calcium: 8.7 mg/dL — ABNORMAL LOW (ref 8.9–10.3)
Chloride: 108 mmol/L (ref 98–111)
Creatinine: 1.28 mg/dL — ABNORMAL HIGH (ref 0.44–1.00)
GFR, Estimated: 45 mL/min — ABNORMAL LOW (ref >60)
Glucose, Bld: 65 mg/dL — ABNORMAL LOW (ref 70–99)
Potassium: 4.3 mmol/L (ref 3.5–5.1)
Sodium: 139 mmol/L (ref 135–145)
Total Bilirubin: 0.4 mg/dL (ref 0.3–1.2)
Total Protein: 6.9 g/dL (ref 6.5–8.1)

## 2023-03-25 LAB — IRON AND TIBC
Iron: 330 ug/dL — ABNORMAL HIGH (ref 28–170)
Saturation Ratios: 78 % — ABNORMAL HIGH (ref 10.4–31.8)
TIBC: 423 ug/dL (ref 250–450)
UIBC: 93 ug/dL

## 2023-03-25 LAB — CBC (CANCER CENTER ONLY)
HCT: 31.2 % — ABNORMAL LOW (ref 36.0–46.0)
Hemoglobin: 9.2 g/dL — ABNORMAL LOW (ref 12.0–15.0)
MCH: 26.4 pg (ref 26.0–34.0)
MCHC: 29.5 g/dL — ABNORMAL LOW (ref 30.0–36.0)
MCV: 89.7 fL (ref 80.0–100.0)
Platelet Count: 342 10*3/uL (ref 150–400)
RBC: 3.48 MIL/uL — ABNORMAL LOW (ref 3.87–5.11)
RDW: 15.3 % (ref 11.5–15.5)
WBC Count: 7.8 10*3/uL (ref 4.0–10.5)
nRBC: 0 % (ref 0.0–0.2)

## 2023-03-25 MED ORDER — SODIUM CHLORIDE 0.9 % IV SOLN
Freq: Once | INTRAVENOUS | Status: AC
  Filled 2023-03-25: qty 250

## 2023-03-25 MED ORDER — SODIUM CHLORIDE 0.9 % IV SOLN
200.0000 mg | Freq: Once | INTRAVENOUS | Status: AC
  Administered 2023-03-25: 200 mg via INTRAVENOUS
  Filled 2023-03-25: qty 200

## 2023-03-25 NOTE — Assessment & Plan Note (Addendum)
#  Anemia secondary to chronic kidney disease-[nephrology office; March 2021]-iron saturation 15% recently noted to have worsening anemia;  JUNE 2023- Hb 9. Iron studies pending-  On PO iron/day [no GI issues]   # patient is symptomatic.  On PO iron; recommend venofer weekly. Ordered.   # ? Etiology-No obvious GI loss. Liley CKD- monitor for now.    # CKD- III-GFR 40-50s-diabetes/hypertension-followed by nephrology [Dr.Lateef].  # DISPOSITION: # Venofer # venofer weekly x 2  [start June 28th] # Follow up in 3 months- MD- labs- cbc/cmp/; iron studies;ferritin- Possible venofer--Dr.B

## 2023-03-25 NOTE — Progress Notes (Signed)
[facility] CONSULT NOTE  Patient Care Team: Barbette Reichmann, MD as PCP - General (Internal Medicine) Earna Coder, MD as Consulting Physician (Hematology and Oncology)  CHIEF COMPLAINTS/PURPOSE OF CONSULTATION: Anemia   HEMATOLOGY HISTORY  #Chronic kidney disease-stage III/ ANEMIA: EGD [~10 y]/colonoscopy-postponed in april;[March 2021- Hb ~10.3; Iron sat- 15%] ; recommend PO iron; HOLD IV Iron.   # CKD [Dr.Lateef]-GFR 40s.    HISTORY OF PRESENTING ILLNESS:  Patient ambulating-in wheel chair  Alone/ transportation  [name] [name] 72 y.o.  female patient with iron deficient anemia-question CKD versus others is here for follow-up.  Patient patient states that she has been diligently taking iron pills once a day.  Complains of on going fatigue.    No abdominal pain discomfort dyspepsia.  Review of Systems  Constitutional:  Positive for malaise/fatigue. Negative for chills, diaphoresis, fever and weight loss.  HENT:  Negative for nosebleeds and sore throat.   Eyes:  Negative for double vision.  Respiratory:  Negative for cough, hemoptysis, sputum production, shortness of breath and wheezing.   Cardiovascular:  Negative for chest pain, palpitations, orthopnea and leg swelling.  Gastrointestinal:  Negative for abdominal pain, blood in stool, constipation, diarrhea, heartburn, melena, nausea and vomiting.  Genitourinary:  Negative for dysuria, frequency and urgency.  Musculoskeletal:  Positive for back pain and joint pain.  Skin: Negative.  Negative for itching and rash.  Neurological:  Negative for dizziness, tingling, focal weakness, weakness and headaches.  Endo/Heme/Allergies:  Does not bruise/bleed easily.  Psychiatric/Behavioral:  Negative for depression. The patient is not nervous/anxious and does not have insomnia.     MEDICAL HISTORY:  Past Medical History:  Diagnosis Date   Anemia    Asthma    Chronic airway obstruction (HCC)    Chronic kidney  disease    F/U WITH DR LATEEF   Diabetes mellitus without complication (HCC)    Dyspnea    WITH EXERTION   GERD (gastroesophageal reflux disease)    Heart murmur    ASYMPTOMATIC   History of adenomatous polyp of colon    History of bone density study    Hyperlipidemia    Hypertension    Idiopathic peripheral neuropathy    Morbid obesity (HCC)    40.0-44.9   Osteoporosis    Plantar fascial fibromatosis    Pulmonary fibrosis (HCC)    Sleep apnea    TIA (transient ischemic attack)     SURGICAL HISTORY: Past Surgical History:  Procedure Laterality Date   ABDOMINAL HYSTERECTOMY     BACK SURGERY     BLADDER SUSPENSION     CESAREAN SECTION     COLONOSCOPY     COLONOSCOPY N/A 06/29/2021   Procedure: COLONOSCOPY;  Surgeon: Toledo, Boykin Nearing, MD;  Location: [facility];  Service: Gastroenterology;  Laterality: N/A;   COLONOSCOPY WITH PROPOFOL N/A 08/30/2020   Procedure: COLONOSCOPY WITH PROPOFOL;  Surgeon: Regis Bill, MD;  Location: [facility];  Service: Endoscopy;  Laterality: N/A;   DILATION AND CURETTAGE OF UTERUS     ESOPHAGOGASTRODUODENOSCOPY     ESOPHAGOGASTRODUODENOSCOPY N/A 06/29/2021   Procedure: ESOPHAGOGASTRODUODENOSCOPY (EGD);  Surgeon: Toledo, Boykin Nearing, MD;  Location: [facility];  Service: Gastroenterology;  Laterality: N/A;   ESOPHAGOGASTRODUODENOSCOPY (EGD) WITH PROPOFOL N/A 08/30/2020   Procedure: ESOPHAGOGASTRODUODENOSCOPY (EGD) WITH PROPOFOL;  Surgeon: Regis Bill, MD;  Location: [facility];  Service: Endoscopy;  Laterality: N/A;   LUMBAR LAMINECTOMY/DECOMPRESSION MICRODISCECTOMY N/A 10/15/2016   Procedure: L3-L5 Laminectomy ;  Surgeon: Lucy Chris, MD;  Location: [facility];  Service: Neurosurgery;  Laterality: N/A;   LUMBAR WOUND DEBRIDEMENT N/A 11/05/2016   Procedure: LUMBAR WOUND DEBRIDEMENT;  Surgeon: Lucy Chris, MD;  Location: [facility];  Service: Neurosurgery;  Laterality: N/A;   REVERSE SHOULDER ARTHROPLASTY Right 05/05/2020    Procedure: REVERSE SHOULDER ARTHROPLASTY;  Surgeon: Christena Flake, MD;  Location: [facility];  Service: Orthopedics;  Laterality: Right;   REVERSE SHOULDER ARTHROPLASTY Left 02/23/2021   Procedure: REVERSE SHOULDER ARTHROPLASTY;  Surgeon: Christena Flake, MD;  Location: [facility];  Service: Orthopedics;  Laterality: Left;   TUBAL LIGATION     VEIN LIGATION AND STRIPPING      SOCIAL HISTORY: Social History   Socioeconomic History   Marital status: Single    Spouse name: Not on file   Number of children: Not on file   Years of education: Not on file   Highest education level: Not on file  Occupational History   Not on file  Tobacco Use   Smoking status: Former    Packs/day: 3.00    Years: 30.00    Additional pack years: 0.00    Total pack years: 90.00    Types: Cigarettes    Quit date: 04/28/2000    Years since quitting: 22.9   Smokeless tobacco: Never  Vaping Use   Vaping Use: Never used  Substance and Sexual Activity   Alcohol use: No   Drug use: No   Sexual activity: Not on file  Other Topics Concern   Not on file  Social History Narrative   Lives in [place]; by self; walks with walker/gait instability; quit smoking 20 years ago; no alcohol.    Social Determinants of Health   Financial Resource Strain: Not on file  Food Insecurity: Not on file  Transportation Needs: Not on file  Physical Activity: Not on file  Stress: Not on file  Social Connections: Not on file  Intimate Partner Violence: Not on file    FAMILY HISTORY: Family History  Problem Relation Age of Onset   Stroke Mother    Heart attack Mother    Breast cancer Sister 66   Stroke Sister    Heart attack Sister    Breast cancer Cousin        maternal side 60's    ALLERGIES:  is allergic to codeine, furosemide, penicillin v potassium, quinine, tramadol, and tylenol [acetaminophen].  MEDICATIONS:  Current Outpatient Medications  Medication Sig Dispense Refill   clotrimazole-betamethasone  (LOTRISONE) cream Apply 1 application topically 2 (two) times daily as needed (itching).      erythromycin ophthalmic ointment SMARTSIG:In Eye(s)     ferrous sulfate 325 (65 FE) MG tablet Take 1 tablet (325 mg total) by mouth 2 (two) times daily with a meal. 60 tablet 1   fluticasone-salmeterol (ADVAIR HFA) 115-21 MCG/ACT inhaler Inhale 2 puffs into the lungs 2 (two) times daily.     hydrocortisone 2.5 % cream Apply 1 application topically 2 (two) times daily. (Apply to face as spot treatment)     ketoconazole (NIZORAL) 2 % cream Apply 1 application  topically 2 (two) times daily. (Apply to areas on face)     losartan (COZAAR) 50 MG tablet Take 50 mg by mouth every morning.      mometasone-formoterol (DULERA) 100-5 MCG/ACT AERO Inhale 2 puffs into the lungs 2 (two) times daily as needed for wheezing or shortness of breath.     oxyCODONE (OXY IR/ROXICODONE) 5 MG immediate release tablet Take 1-2 tablets (5-10 mg total)  by mouth every 4 (four) hours as needed for moderate pain. (Patient taking differently: Take 5 mg by mouth 2 (two) times daily as needed for moderate pain or severe pain.) 60 tablet 0   pantoprazole (PROTONIX) 40 MG tablet Take 1 tablet (40 mg total) by mouth daily. 30 tablet 1   albuterol (VENTOLIN HFA) 108 (90 Base) MCG/ACT inhaler Inhale 2 puffs into the lungs every 6 (six) hours as needed for wheezing or shortness of breath. (Patient not taking: Reported on 11/30/2022)     diclofenac Sodium (VOLTAREN) 1 % GEL Apply topically 4 (four) times daily. (Patient not taking: Reported on 11/30/2022)     No current facility-administered medications for this visit.    PHYSICAL EXAMINATION:  # positive heart murmur.   Vitals:   03/25/23 1444  BP: (!) 133/58  Pulse: 66  Temp: 98.2 F (36.8 C)  SpO2: 98%   Filed Weights   03/25/23 1444  Weight: 172 lb 11.2 oz (78.3 kg)    Physical Exam Constitutional:      Comments: Obese.  In a wheelchair.  HENT:     Head: Normocephalic and  atraumatic.     Mouth/Throat:     Pharynx: No oropharyngeal exudate.  Eyes:     Pupils: Pupils are equal, round, and reactive to light.  Cardiovascular:     Rate and Rhythm: Normal rate and regular rhythm.  Pulmonary:     Effort: Pulmonary effort is normal. No respiratory distress.     Breath sounds: Normal breath sounds. No wheezing.  Abdominal:     General: Bowel sounds are normal. There is no distension.     Palpations: Abdomen is soft. There is no mass.     Tenderness: There is no abdominal tenderness. There is no guarding or rebound.  Musculoskeletal:        General: No tenderness. Normal range of motion.     Cervical back: Normal range of motion and neck supple.  Skin:    General: Skin is warm.  Neurological:     Mental Status: She is alert and oriented to person, place, and time.  Psychiatric:        Mood and Affect: Affect normal.     LABORATORY DATA:  I have reviewed the data as listed Lab Results  Component Value Date   WBC 7.8 03/25/2023   HGB 9.2 (L) 03/25/2023   HCT 31.2 (L) 03/25/2023   MCV 89.7 03/25/2023   PLT 342 03/25/2023   Recent Labs    03/25/23 1424  NA 139  K 4.3  CL 108  CO2 23  GLUCOSE 65*  BUN 22  CREATININE 1.28*  CALCIUM 8.7*  GFRNONAA 45*  PROT 6.9  ALBUMIN 4.0  AST 23  ALT 11  ALKPHOS 76  BILITOT 0.4     No results found.  Normocytic anemia #Anemia secondary to chronic kidney disease-[nephrology office; March 2021]-iron saturation 15% recently noted to have worsening anemia;  JUNE 2023- Hb 9. Iron studies pending-  On PO iron/day [no GI issues]   # patient is symptomatic.  On PO iron; recommend venofer weekly. Ordered.   # ? Etiology-No obvious GI loss. Liley CKD- monitor for now.    # CKD- III-GFR 40-50s-diabetes/hypertension-followed by nephrology [Dr.Lateef].  # DISPOSITION: # Venofer # venofer weekly x 2  [start June 28th] # Follow up in 3 months- MD- labs- cbc/cmp/; iron studies;ferritin- Possible  venofer--Dr.B  All questions were answered. The patient knows to call the clinic with any problems,  questions or concerns.    Earna Coder, MD 03/25/2023 3:45 PM

## 2023-04-01 ENCOUNTER — Encounter: Payer: Self-pay | Admitting: Internal Medicine

## 2023-04-01 ENCOUNTER — Other Ambulatory Visit: Payer: Self-pay | Admitting: Internal Medicine

## 2023-04-01 DIAGNOSIS — Z1231 Encounter for screening mammogram for malignant neoplasm of breast: Secondary | ICD-10-CM

## 2023-04-05 ENCOUNTER — Inpatient Hospital Stay: Payer: 59

## 2023-04-06 ENCOUNTER — Encounter: Payer: Self-pay | Admitting: Internal Medicine

## 2023-04-08 ENCOUNTER — Encounter: Payer: Self-pay | Admitting: Internal Medicine

## 2023-04-09 ENCOUNTER — Ambulatory Visit (INDEPENDENT_AMBULATORY_CARE_PROVIDER_SITE_OTHER): Payer: 59 | Admitting: Podiatry

## 2023-04-09 DIAGNOSIS — M79674 Pain in right toe(s): Secondary | ICD-10-CM

## 2023-04-09 DIAGNOSIS — B351 Tinea unguium: Secondary | ICD-10-CM

## 2023-04-09 DIAGNOSIS — M79675 Pain in left toe(s): Secondary | ICD-10-CM | POA: Diagnosis not present

## 2023-04-09 NOTE — Progress Notes (Signed)
   Chief Complaint  Patient presents with   Diabetes    Patient came in today for Diabetic foot care, nail and callus trim A1c - 4.3    SUBJECTIVE Patient presents to office today complaining of elongated, thickened nails that cause pain while ambulating in shoes.  Patient is unable to trim their own nails. Patient is here for further evaluation and treatment.  Past Medical History:  Diagnosis Date   Anemia    Asthma    Chronic airway obstruction (HCC)    Chronic kidney disease    F/U WITH DR LATEEF   Diabetes mellitus without complication (HCC)    Dyspnea    WITH EXERTION   GERD (gastroesophageal reflux disease)    Heart murmur    ASYMPTOMATIC   History of adenomatous polyp of colon    History of bone density study    Hyperlipidemia    Hypertension    Idiopathic peripheral neuropathy    Morbid obesity (HCC)    40.0-44.9   Osteoporosis    Plantar fascial fibromatosis    Pulmonary fibrosis (HCC)    Sleep apnea    TIA (transient ischemic attack)     OBJECTIVE General Patient is awake, alert, and oriented x 3 and in no acute distress. Derm Skin is dry and supple bilateral. Negative open lesions or macerations. Remaining integument unremarkable. Nails are tender, long, thickened and dystrophic with subungual debris, consistent with onychomycosis, 1-5 bilateral. No signs of infection noted.  Hyperkeratotic preulcerative calluses also noted bilateral.  No open wounds Vasc  DP and PT pedal pulses palpable bilaterally. Temperature gradient within normal limits.  Neuro Epicritic and protective threshold sensation grossly intact bilaterally.  Musculoskeletal Exam No symptomatic pedal deformities noted bilateral. Muscular strength within normal limits.  ASSESSMENT 1.  Pain due to onychomycosis of toenails both  PLAN OF CARE 1. Patient evaluated today.  Comprehensive diabetic foot exam performed today 2. Instructed to maintain good pedal hygiene and foot care.  Advised against  going barefoot.  Patient admits to going barefoot around the house 3. Mechanical debridement of nails 1-5 bilaterally performed using a nail nipper. Filed with dremel without incident.  4.  Excisional debridement of the hyperkeratotic preulcerative callus tissue was also performed today using a 312 scalpel without incident or bleeding  5.  Return to clinic annually   Felecia Shelling, DPM [facility]  Dr. Felecia Shelling, DPM    [address] [place], [place] [address]                Office [phone]  Fax [phone]

## 2023-04-12 ENCOUNTER — Inpatient Hospital Stay: Payer: 59 | Attending: Internal Medicine

## 2023-04-12 VITALS — BP 107/61 | HR 62 | Temp 97.2°F | Resp 18

## 2023-04-12 DIAGNOSIS — N183 Chronic kidney disease, stage 3 unspecified: Secondary | ICD-10-CM | POA: Insufficient documentation

## 2023-04-12 DIAGNOSIS — D631 Anemia in chronic kidney disease: Secondary | ICD-10-CM | POA: Insufficient documentation

## 2023-04-12 MED ORDER — SODIUM CHLORIDE 0.9 % IV SOLN
200.0000 mg | Freq: Once | INTRAVENOUS | Status: AC
  Administered 2023-04-12: 200 mg via INTRAVENOUS
  Filled 2023-04-12: qty 200

## 2023-04-12 MED ORDER — SODIUM CHLORIDE 0.9 % IV SOLN
Freq: Once | INTRAVENOUS | Status: AC
  Filled 2023-04-12: qty 250

## 2023-04-12 NOTE — Patient Instructions (Signed)

## 2023-04-19 ENCOUNTER — Inpatient Hospital Stay: Payer: 59

## 2023-04-19 VITALS — BP 114/50 | HR 73 | Temp 97.6°F | Resp 19

## 2023-04-19 DIAGNOSIS — N183 Chronic kidney disease, stage 3 unspecified: Secondary | ICD-10-CM | POA: Diagnosis not present

## 2023-04-19 DIAGNOSIS — N1832 Chronic kidney disease, stage 3b: Secondary | ICD-10-CM

## 2023-04-19 MED ORDER — SODIUM CHLORIDE 0.9 % IV SOLN
Freq: Once | INTRAVENOUS | Status: AC
  Filled 2023-04-19: qty 250

## 2023-04-19 MED ORDER — SODIUM CHLORIDE 0.9 % IV SOLN
200.0000 mg | Freq: Once | INTRAVENOUS | Status: AC
  Administered 2023-04-19: 200 mg via INTRAVENOUS
  Filled 2023-04-19: qty 200

## 2023-04-19 NOTE — Patient Instructions (Signed)

## 2023-05-14 ENCOUNTER — Other Ambulatory Visit: Payer: Self-pay | Admitting: Gastroenterology

## 2023-05-14 DIAGNOSIS — K862 Cyst of pancreas: Secondary | ICD-10-CM

## 2023-05-27 ENCOUNTER — Ambulatory Visit
Admission: RE | Admit: 2023-05-27 | Discharge: 2023-05-27 | Disposition: A | Discharge status: Home / Self Care | Payer: 59 | Source: Ambulatory Visit | Attending: Internal Medicine | Admitting: Internal Medicine

## 2023-05-27 DIAGNOSIS — Z1231 Encounter for screening mammogram for malignant neoplasm of breast: Secondary | ICD-10-CM | POA: Insufficient documentation

## 2023-06-11 ENCOUNTER — Ambulatory Visit: Admission: RE | Admit: 2023-06-11 | Payer: 59 | Source: Ambulatory Visit

## 2023-06-18 ENCOUNTER — Ambulatory Visit
Admission: RE | Admit: 2023-06-18 | Discharge: 2023-06-18 | Disposition: A | Discharge status: Home / Self Care | Payer: 59 | Source: Ambulatory Visit | Attending: Gastroenterology | Admitting: Gastroenterology

## 2023-06-18 DIAGNOSIS — K862 Cyst of pancreas: Secondary | ICD-10-CM | POA: Insufficient documentation

## 2023-06-18 MED ORDER — GADOBUTROL 1 MMOL/ML IV SOLN
7.5000 mL | Freq: Once | INTRAVENOUS | Status: AC | PRN
  Administered 2023-06-18: 7.5 mL via INTRAVENOUS

## 2023-06-25 ENCOUNTER — Inpatient Hospital Stay: Payer: 59 | Attending: Internal Medicine

## 2023-06-25 ENCOUNTER — Encounter: Payer: Self-pay | Admitting: Nurse Practitioner

## 2023-06-25 ENCOUNTER — Inpatient Hospital Stay (HOSPITAL_BASED_OUTPATIENT_CLINIC_OR_DEPARTMENT_OTHER): Payer: 59 | Admitting: Nurse Practitioner

## 2023-06-25 ENCOUNTER — Inpatient Hospital Stay: Payer: 59

## 2023-06-25 VITALS — BP 102/48 | HR 60 | Temp 98.1°F | Resp 18

## 2023-06-25 VITALS — BP 103/59 | HR 71 | Temp 97.6°F | Wt 161.3 lb

## 2023-06-25 DIAGNOSIS — D631 Anemia in chronic kidney disease: Secondary | ICD-10-CM | POA: Insufficient documentation

## 2023-06-25 DIAGNOSIS — N1831 Chronic kidney disease, stage 3a: Secondary | ICD-10-CM | POA: Insufficient documentation

## 2023-06-25 DIAGNOSIS — D509 Iron deficiency anemia, unspecified: Secondary | ICD-10-CM

## 2023-06-25 DIAGNOSIS — D649 Anemia, unspecified: Secondary | ICD-10-CM

## 2023-06-25 LAB — CMP (CANCER CENTER ONLY)
ALT: 10 U/L (ref 0–44)
AST: 19 U/L (ref 15–41)
Albumin: 3.8 g/dL (ref 3.5–5.0)
Alkaline Phosphatase: 66 U/L (ref 38–126)
Anion gap: 6 (ref 5–15)
BUN: 27 mg/dL — ABNORMAL HIGH (ref 8–23)
CO2: 23 mmol/L (ref 22–32)
Calcium: 8.7 mg/dL — ABNORMAL LOW (ref 8.9–10.3)
Chloride: 106 mmol/L (ref 98–111)
Creatinine: 1.14 mg/dL — ABNORMAL HIGH (ref 0.44–1.00)
GFR, Estimated: 52 mL/min — ABNORMAL LOW (ref >60)
Glucose, Bld: 119 mg/dL — ABNORMAL HIGH (ref 70–99)
Potassium: 4.2 mmol/L (ref 3.5–5.1)
Sodium: 135 mmol/L (ref 135–145)
Total Bilirubin: 0.3 mg/dL (ref 0.3–1.2)
Total Protein: 6.6 g/dL (ref 6.5–8.1)

## 2023-06-25 LAB — CBC WITH DIFFERENTIAL (CANCER CENTER ONLY)
Abs Immature Granulocytes: 0.05 10*3/uL (ref 0.00–0.07)
Basophils Absolute: 0.1 10*3/uL (ref 0.0–0.1)
Basophils Relative: 1 %
Eosinophils Absolute: 0.2 10*3/uL (ref 0.0–0.5)
Eosinophils Relative: 2 %
HCT: 25.5 % — ABNORMAL LOW (ref 36.0–46.0)
Hemoglobin: 7.5 g/dL — ABNORMAL LOW (ref 12.0–15.0)
Immature Granulocytes: 1 %
Lymphocytes Relative: 14 %
Lymphs Abs: 1.3 10*3/uL (ref 0.7–4.0)
MCH: 27.2 pg (ref 26.0–34.0)
MCHC: 29.4 g/dL — ABNORMAL LOW (ref 30.0–36.0)
MCV: 92.4 fL (ref 80.0–100.0)
Monocytes Absolute: 0.7 10*3/uL (ref 0.1–1.0)
Monocytes Relative: 7 %
Neutro Abs: 6.9 10*3/uL (ref 1.7–7.7)
Neutrophils Relative %: 75 %
Platelet Count: 347 10*3/uL (ref 150–400)
RBC: 2.76 MIL/uL — ABNORMAL LOW (ref 3.87–5.11)
RDW: 14.5 % (ref 11.5–15.5)
WBC Count: 9.1 10*3/uL (ref 4.0–10.5)
nRBC: 0 % (ref 0.0–0.2)

## 2023-06-25 LAB — IRON AND TIBC
Iron: 289 ug/dL — ABNORMAL HIGH (ref 28–170)
Saturation Ratios: 75 % — ABNORMAL HIGH (ref 10.4–31.8)
TIBC: 384 ug/dL (ref 250–450)
UIBC: 95 ug/dL

## 2023-06-25 LAB — FERRITIN: Ferritin: 20 ng/mL (ref 11–307)

## 2023-06-25 MED ORDER — SODIUM CHLORIDE 0.9 % IV SOLN
200.0000 mg | Freq: Once | INTRAVENOUS | Status: AC
  Administered 2023-06-25: 200 mg via INTRAVENOUS
  Filled 2023-06-25: qty 200

## 2023-06-25 MED ORDER — SODIUM CHLORIDE 0.9 % IV SOLN
Freq: Once | INTRAVENOUS | Status: AC
  Filled 2023-06-25: qty 250

## 2023-06-25 NOTE — Progress Notes (Signed)
[facility] CONSULT NOTE  Patient Care Team: Barbette Reichmann, MD as PCP - General (Internal Medicine) Earna Coder, MD as Consulting Physician (Hematology and Oncology)  CHIEF COMPLAINTS/PURPOSE OF CONSULTATION: Anemia   HEMATOLOGY HISTORY  # Chronic kidney disease-stage III/ ANEMIA: EGD [~10 y]/colonoscopy-postponed in april;[March 2021- Hb ~10.3; Iron sat- 15%] ; recommend PO iron; HOLD IV Iron.   # CKD [Dr.Lateef]-GFR 40s.    HISTORY OF PRESENTING ILLNESS:  [name] [name] 72 y.o.  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 She continues oral iron and endorses black stools. Otherwise she's unaware of blood loss.   Review of Systems  Constitutional:  Positive for malaise/fatigue. Negative for chills, diaphoresis, fever and weight loss.  HENT:  Negative for nosebleeds and sore throat.   Eyes:  Negative for double vision.  Respiratory:  Negative for cough, hemoptysis, sputum production, shortness of breath and wheezing.   Cardiovascular:  Negative for chest pain, palpitations, orthopnea and leg swelling.  Gastrointestinal:  Negative for abdominal pain, blood in stool, constipation, diarrhea, heartburn, melena, nausea and vomiting.  Genitourinary:  Negative for dysuria, frequency and urgency.  Musculoskeletal:  Positive for back pain and joint pain.  Skin: Negative.  Negative for itching and rash.  Neurological:  Negative for dizziness, tingling, focal weakness, weakness and headaches.  Endo/Heme/Allergies:  Does not bruise/bleed easily.  Psychiatric/Behavioral:  Negative for depression. The patient is not nervous/anxious and does not have insomnia.     MEDICAL HISTORY:  Past Medical History:  Diagnosis Date   Anemia    Asthma    Chronic airway obstruction (HCC)    Chronic kidney disease    F/U WITH DR LATEEF   Diabetes mellitus without complication (HCC)    Dyspnea    WITH EXERTION    GERD (gastroesophageal reflux disease)    Heart murmur    ASYMPTOMATIC   History of adenomatous polyp of colon    History of bone density study    Hyperlipidemia    Hypertension    Idiopathic peripheral neuropathy    Morbid obesity (HCC)    40.0-44.9   Osteoporosis    Plantar fascial fibromatosis    Pulmonary fibrosis (HCC)    Sleep apnea    TIA (transient ischemic attack)     SURGICAL HISTORY: Past Surgical History:  Procedure Laterality Date   ABDOMINAL HYSTERECTOMY     BACK SURGERY     BLADDER SUSPENSION     CESAREAN SECTION     COLONOSCOPY     COLONOSCOPY N/A 06/29/2021   Procedure: COLONOSCOPY;  Surgeon: Toledo, Boykin Nearing, MD;  Location: [facility];  Service: Gastroenterology;  Laterality: N/A;   COLONOSCOPY WITH PROPOFOL N/A 08/30/2020   Procedure: COLONOSCOPY WITH PROPOFOL;  Surgeon: Regis Bill, MD;  Location: [facility];  Service: Endoscopy;  Laterality: N/A;   DILATION AND CURETTAGE OF UTERUS     ESOPHAGOGASTRODUODENOSCOPY     ESOPHAGOGASTRODUODENOSCOPY N/A 06/29/2021   Procedure: ESOPHAGOGASTRODUODENOSCOPY (EGD);  Surgeon: Toledo, Boykin Nearing, MD;  Location: [facility];  Service: Gastroenterology;  Laterality: N/A;   ESOPHAGOGASTRODUODENOSCOPY (EGD) WITH PROPOFOL N/A 08/30/2020   Procedure: ESOPHAGOGASTRODUODENOSCOPY (EGD) WITH PROPOFOL;  Surgeon: Regis Bill, MD;  Location: [facility];  Service: Endoscopy;  Laterality: N/A;   LUMBAR LAMINECTOMY/DECOMPRESSION MICRODISCECTOMY N/A 10/15/2016   Procedure: L3-L5 Laminectomy ;  Surgeon: Lucy Chris, MD;  Location: [facility];  Service: Neurosurgery;  Laterality: N/A;   LUMBAR WOUND DEBRIDEMENT N/A  11/05/2016   Procedure: LUMBAR WOUND DEBRIDEMENT;  Surgeon: Lucy Chris, MD;  Location: [facility];  Service: Neurosurgery;  Laterality: N/A;   REVERSE SHOULDER ARTHROPLASTY Right 05/05/2020   Procedure: REVERSE SHOULDER ARTHROPLASTY;  Surgeon: Christena Flake, MD;  Location: [facility];  Service:  Orthopedics;  Laterality: Right;   REVERSE SHOULDER ARTHROPLASTY Left 02/23/2021   Procedure: REVERSE SHOULDER ARTHROPLASTY;  Surgeon: Christena Flake, MD;  Location: [facility];  Service: Orthopedics;  Laterality: Left;   TUBAL LIGATION     VEIN LIGATION AND STRIPPING      SOCIAL HISTORY: Social History   Socioeconomic History   Marital status: Single    Spouse name: Not on file   Number of children: Not on file   Years of education: Not on file   Highest education level: Not on file  Occupational History   Not on file  Tobacco Use   Smoking status: Former    Current packs/day: 0.00    Average packs/day: 3.0 packs/day for 30.0 years (90.0 ttl pk-yrs)    Types: Cigarettes    Start date: 04/28/1970    Quit date: 04/28/2000    Years since quitting: 23.1   Smokeless tobacco: Never  Vaping Use   Vaping status: Never Used  Substance and Sexual Activity   Alcohol use: No   Drug use: No   Sexual activity: Not on file  Other Topics Concern   Not on file  Social History Narrative   Lives in [place]; by self; walks with walker/gait instability; quit smoking 20 years ago; no alcohol.    Social Determinants of Health   Financial Resource Strain: Low Risk  (04/16/2023)   Received from [facility]   Overall Financial Resource Strain (CARDIA)    Difficulty of Paying Living Expenses: Not hard at all  Food Insecurity: Food Insecurity Present (04/16/2023)   Received from [facility]   Hunger Vital Sign    Worried About Running Out of Food in the Last Year: Never true    Ran Out of Food in the Last Year: Sometimes true  Transportation Needs: No Transportation Needs (04/16/2023)   Received from [facility] - Transportation    In the past 12 months, has lack of transportation kept you from medical appointments or from getting medications?: No    Lack of Transportation (Non-Medical): No  Recent Concern: Transportation Needs -  Unmet Transportation Needs (02/04/2023)   Received from [facility], [facility]   [facility] - Transportation    In the past 12 months, has lack of transportation kept you from medical appointments or from getting medications?: Yes    Lack of Transportation (Non-Medical): Yes  Physical Activity: Not on file  Stress: Not on file  Social Connections: Not on file  Intimate Partner Violence: Not on file    FAMILY HISTORY: Family History  Problem Relation Age of Onset   Stroke Mother    Heart attack Mother    Breast cancer Sister 32   Stroke Sister    Heart attack Sister    Breast cancer Cousin        maternal side 60's    ALLERGIES:  is allergic to codeine, furosemide, penicillin v potassium, quinine, tramadol, and tylenol [acetaminophen].  MEDICATIONS:  Current Outpatient Medications  Medication Sig Dispense Refill   clotrimazole-betamethasone (LOTRISONE) cream Apply 1 application topically 2 (two) times daily as needed (itching).      erythromycin ophthalmic  ointment SMARTSIG:In Eye(s)     ferrous sulfate 325 (65 FE) MG tablet Take 1 tablet (325 mg total) by mouth 2 (two) times daily with a meal. 60 tablet 1   fluticasone-salmeterol (ADVAIR HFA) 115-21 MCG/ACT inhaler Inhale 2 puffs into the lungs 2 (two) times daily.     hydrocortisone 2.5 % cream Apply 1 application topically 2 (two) times daily. (Apply to face as spot treatment)     ketoconazole (NIZORAL) 2 % cream Apply 1 application  topically 2 (two) times daily. (Apply to areas on face)     losartan (COZAAR) 50 MG tablet Take 50 mg by mouth every morning.      Magnesium 400 MG TABS Take 1 tablet by mouth daily.     mometasone-formoterol (DULERA) 100-5 MCG/ACT AERO Inhale 2 puffs into the lungs 2 (two) times daily as needed for wheezing or shortness of breath.     oxyCODONE (OXY IR/ROXICODONE) 5 MG immediate release tablet Take 1-2 tablets (5-10 mg total) by mouth every 4 (four) hours as  needed for moderate pain. (Patient taking differently: Take 5 mg by mouth 2 (two) times daily as needed for moderate pain or severe pain.) 60 tablet 0   albuterol (VENTOLIN HFA) 108 (90 Base) MCG/ACT inhaler Inhale 2 puffs into the lungs every 6 (six) hours as needed for wheezing or shortness of breath. (Patient not taking: Reported on 11/30/2022)     diclofenac Sodium (VOLTAREN) 1 % GEL Apply topically 4 (four) times daily. (Patient not taking: Reported on 11/30/2022)     pantoprazole (PROTONIX) 40 MG tablet Take 1 tablet (40 mg total) by mouth daily. 30 tablet 1   No current facility-administered medications for this visit.    PHYSICAL EXAMINATION: Vitals:   06/25/23 1429  BP: (!) 103/59  Pulse: 71  Temp: 97.6 F (36.4 C)  SpO2: 98%   Filed Weights   06/25/23 1429  Weight: 161 lb 4.8 oz (73.2 kg)   Physical Exam Vitals reviewed.  Constitutional:      Appearance: She is obese. She is not ill-appearing.     Comments: In wheelchair. Unaccompanied.   Cardiovascular:     Rate and Rhythm: Normal rate and regular rhythm.  Pulmonary:     Effort: Pulmonary effort is normal. No respiratory distress.     Breath sounds: No wheezing.  Abdominal:     General: There is no distension.     Palpations: Abdomen is soft.     Tenderness: There is no abdominal tenderness. There is no guarding.  Musculoskeletal:        General: No tenderness.  Skin:    General: Skin is warm and dry.     Coloration: Skin is not pale.  Neurological:     Mental Status: She is alert and oriented to person, place, and time.  Psychiatric:        Mood and Affect: Mood and affect normal.        Behavior: Behavior normal.    LABORATORY DATA:  I have reviewed the data as listed Lab Results  Component Value Date   WBC 9.1 06/25/2023   HGB 7.5 (L) 06/25/2023   HCT 25.5 (L) 06/25/2023   MCV 92.4 06/25/2023   PLT 347 06/25/2023   Iron/TIBC/Ferritin/ %Sat    Component Value Date/Time   IRON 330 (H) 03/25/2023  1424   TIBC 423 03/25/2023 1424   FERRITIN 33 03/25/2023 1424   IRONPCTSAT 78 (H) 03/25/2023 1424      Latest Ref Rng &  Units 06/25/2023    2:04 PM 03/25/2023    2:24 PM 02/01/2022   10:07 AM  CMP  Glucose 70 - 99 mg/dL 147  65    BUN 8 - 23 mg/dL 27  22    Creatinine 8.29 - 1.00 mg/dL 5.62  1.30  8.65   Sodium 135 - 145 mmol/L 135  139    Potassium 3.5 - 5.1 mmol/L 4.2  4.3    Chloride 98 - 111 mmol/L 106  108    CO2 22 - 32 mmol/L 23  23    Calcium 8.9 - 10.3 mg/dL 8.7  8.7    Total Protein 6.5 - 8.1 g/dL 6.6  6.9    Total Bilirubin 0.3 - 1.2 mg/dL 0.3  0.4    Alkaline Phos 38 - 126 U/L 66  76    AST 15 - 41 U/L 19  23    ALT 0 - 44 U/L 10  11     MR ABDOMEN MRCP W WO CONTAST  Result Date: 06/20/2023 CLINICAL DATA:  Abdominal pain for 1 year, evaluate pancreatic cyst EXAM: MRI ABDOMEN WITHOUT AND WITH CONTRAST (INCLUDING MRCP) TECHNIQUE: Multiplanar multisequence MR imaging of the abdomen was performed both before and after the administration of intravenous contrast. Heavily T2-weighted images of the biliary and pancreatic ducts were obtained, and three-dimensional MRCP images were rendered by post processing. CONTRAST:  7.85mL GADAVIST GADOBUTROL 1 MMOL/ML IV SOLN COMPARISON:  MR abdomen, 07/10/2022, CT chest, 06/16/2018 FINDINGS: Lower chest: No acute abnormality. Hepatobiliary: No solid liver abnormality is seen. Mild signal inversion on in and opposed phase imaging. Intrinsic T2 hypointensity of the liver parenchyma. No gallstones, gallbladder wall thickening, or biliary dilatation. Pancreas: Numerous simple and thinly septated fluid signal cystic lesions throughout the pancreas, largest again in the ventral pancreatic neck measuring 1.3 x 1.0 cm (series 4, image 16) and in the pancreatic body measuring 1.5 x 0.8 cm (series 4, image 18). These are not significantly changed in comparison to multiple prior MR examinations and additionally in comparison to CT examination of the chest dated  06/16/2018. No pancreatic ductal dilatation or surrounding inflammatory changes. Spleen: Normal in size without significant abnormality. Adrenals/Urinary Tract: Adrenal glands are unremarkable. Benign fluid signal renal cortical cysts, for which no further follow-up or characterization is required. Kidneys are otherwise normal, without obvious renal calculi, solid lesion, or hydronephrosis. Stomach/Bowel: Stomach is within normal limits. No evidence of bowel wall thickening, distention, or inflammatory changes. Pancolonic diverticulosis Vascular/Lymphatic: Aortic atherosclerosis. No enlarged abdominal lymph nodes. Other: No abdominal wall hernia or abnormality. No ascites. Musculoskeletal: No acute or significant osseous findings. IMPRESSION: 1. Numerous simple and thinly septated fluid signal cystic lesions throughout the pancreas, largest again in the ventral pancreatic neck measuring 1.3 x 1.0 cm and in the pancreatic body measuring 1.5 x 0.8 cm. These are not significantly changed in comparison to multiple prior MR examinations and additionally in comparison to CT examination of the chest dated 06/16/2018. 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 Given small size and greater than 5 years established imaging stability, no further follow-up or characterization is required. 2. Hepatic iron deposition. 3. Pancolonic diverticulosis. Aortic Atherosclerosis (ICD10-I70.0). Electronically Signed   By: Jearld Lesch M.D.   On: 06/20/2023 22:12   MR 3D Recon At Scanner  Result Date: 06/20/2023 CLINICAL DATA:  Abdominal pain for 1 year, evaluate pancreatic cyst EXAM: MRI ABDOMEN WITHOUT AND WITH CONTRAST (INCLUDING MRCP) TECHNIQUE: Multiplanar multisequence MR imaging of the abdomen was  performed both before and after the administration of intravenous contrast. Heavily T2-weighted images of the biliary and pancreatic ducts were obtained, and three-dimensional MRCP images were rendered by post  processing. CONTRAST:  7.83mL GADAVIST GADOBUTROL 1 MMOL/ML IV SOLN COMPARISON:  MR abdomen, 07/10/2022, CT chest, 06/16/2018 FINDINGS: Lower chest: No acute abnormality. Hepatobiliary: No solid liver abnormality is seen. Mild signal inversion on in and opposed phase imaging. Intrinsic T2 hypointensity of the liver parenchyma. No gallstones, gallbladder wall thickening, or biliary dilatation. Pancreas: Numerous simple and thinly septated fluid signal cystic lesions throughout the pancreas, largest again in the ventral pancreatic neck measuring 1.3 x 1.0 cm (series 4, image 16) and in the pancreatic body measuring 1.5 x 0.8 cm (series 4, image 18). These are not significantly changed in comparison to multiple prior MR examinations and additionally in comparison to CT examination of the chest dated 06/16/2018. No pancreatic ductal dilatation or surrounding inflammatory changes. Spleen: Normal in size without significant abnormality. Adrenals/Urinary Tract: Adrenal glands are unremarkable. Benign fluid signal renal cortical cysts, for which no further follow-up or characterization is required. Kidneys are otherwise normal, without obvious renal calculi, solid lesion, or hydronephrosis. Stomach/Bowel: Stomach is within normal limits. No evidence of bowel wall thickening, distention, or inflammatory changes. Pancolonic diverticulosis Vascular/Lymphatic: Aortic atherosclerosis. No enlarged abdominal lymph nodes. Other: No abdominal wall hernia or abnormality. No ascites. Musculoskeletal: No acute or significant osseous findings. IMPRESSION: 1. Numerous simple and thinly septated fluid signal cystic lesions throughout the pancreas, largest again in the ventral pancreatic neck measuring 1.3 x 1.0 cm and in the pancreatic body measuring 1.5 x 0.8 cm. These are not significantly changed in comparison to multiple prior MR examinations and additionally in comparison to CT examination of the chest dated 06/16/2018. Findings are  consistent with pancreatic pseudocysts and/or IPMNs. Given small size and greater than 5 years established imaging stability, no further follow-up or characterization is required. 2. Hepatic iron deposition. 3. Pancolonic diverticulosis. Aortic Atherosclerosis (ICD10-I70.0). Electronically Signed   By: Jearld Lesch M.D.   On: 06/20/2023 22:12   MM 3D SCREENING MAMMOGRAM BILATERAL BREAST  Result Date: 05/28/2023 CLINICAL DATA:  Screening. EXAM: DIGITAL SCREENING BILATERAL MAMMOGRAM WITH TOMOSYNTHESIS AND CAD TECHNIQUE: Bilateral screening digital craniocaudal and mediolateral oblique mammograms were obtained. Bilateral screening digital breast tomosynthesis was performed. The images were evaluated with computer-aided detection. COMPARISON:  Previous exam(s). ACR Breast Density Category a: The breasts are almost entirely fatty. FINDINGS: There are no findings suspicious for malignancy. IMPRESSION: No mammographic evidence of malignancy. A result letter of this screening mammogram will be mailed directly to the patient. RECOMMENDATION: Screening mammogram in one year. (Code:[id number]) BI-RADS CATEGORY  1: Negative. Electronically Signed   By: Edwin Cap M.D.   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 June 2023- hmg 9, iron sat 15%. On PO iron daily. Tolerated well. Received venofer x 3 in June. Hemoglobin has worsened to 7.5. Iron studies pending at time of visit. Ferritin 20. Iron sat 75%. Recommend venofer x 5 with first dose today. Also offered transfusion but since she is asymptomatic, she prefers to receive IV iron and monitor counts closely. If she becomes increasingly symptomatic, we can reconsider.  Melena- Last colonoscopy and endoscopy with Dr. Norma Fredrickson 06/2021. Diverticula noted. Large adenoma. 1cm hiatal hernia. Gastritis. No capsule. She's now being followed by Dr.  Mia Creek. Saw [employer]. She  has colonoscopy later this year.  Epistaxis- platelets are normal. May be related to antihistamines/allergies or others. Recommend she follow up with pcp and if ongoing concerns, consider evaluation with ENT.  CKD- stage IIIa. GFR 40-50s-diabetes/hypertension. Followed by nephrology [Dr.Lateef]. Reviewed etiology of anemia in CKD. Recommend optimizing iron stores. If hemoglobin remains < 10, could consider starting epo.  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 Offered counseling or psychiatry but patient declines.    DISPOSITION: Venofer x 5. She'll receive first dose today.  1 mo- lab (cbc, cmp, ferritin, iron studies, hold tube), see me, +/- venofer 3 mo- labs (cbc, cmp, ferritin, iron studies, hold tube), Dr Donneta Romberg, +/- venofer- la  No problem-specific Assessment & Plan notes found for this encounter.  All questions were answered. The patient knows to call the clinic with any problems, questions or concerns.  Alinda Dooms, NP 06/25/2023

## 2023-07-02 ENCOUNTER — Inpatient Hospital Stay: Payer: 59

## 2023-07-02 VITALS — BP 104/44 | HR 71 | Temp 97.0°F | Resp 19

## 2023-07-02 DIAGNOSIS — N1832 Chronic kidney disease, stage 3b: Secondary | ICD-10-CM

## 2023-07-02 DIAGNOSIS — N1831 Chronic kidney disease, stage 3a: Secondary | ICD-10-CM | POA: Diagnosis not present

## 2023-07-02 MED ORDER — SODIUM CHLORIDE 0.9 % IV SOLN
Freq: Once | INTRAVENOUS | Status: AC
  Filled 2023-07-02: qty 250

## 2023-07-02 MED ORDER — SODIUM CHLORIDE 0.9 % IV SOLN
200.0000 mg | Freq: Once | INTRAVENOUS | Status: AC
  Administered 2023-07-02: 200 mg via INTRAVENOUS
  Filled 2023-07-02: qty 200

## 2023-07-02 NOTE — Patient Instructions (Signed)
Iron Sucrose Injection What is this medication? IRON SUCROSE (EYE ern SOO krose) treats low levels of iron (iron deficiency anemia) in people with kidney disease. Iron is a mineral that plays an important role in making red blood cells, which carry oxygen from your lungs to the rest of your body. This medicine may be used for other purposes; ask your health care provider or pharmacist if you have questions. COMMON BRAND NAME(S): Venofer What should I tell my care team before I take this medication? They need to know if you have any of these conditions: Anemia not caused by low iron levels Heart disease High levels of iron in the blood Kidney disease Liver disease An unusual or allergic reaction to iron, other medications, foods, dyes, or preservatives Pregnant or trying to get pregnant Breastfeeding How should I use this medication? This medication is for infusion into a vein. It is given in a hospital or clinic setting. Talk to your care team about the use of this medication in children. While this medication may be prescribed for children as young as 2 years for selected conditions, precautions do apply. Overdosage: If you think you have taken too much of this medicine contact a poison control center or emergency room at once. NOTE: This medicine is only for you. Do not share this medicine with others. What if I miss a dose? Keep appointments for follow-up doses. It is important not to miss your dose. Call your care team if you are unable to keep an appointment. What may interact with this medication? Do not take this medication with any of the following: Deferoxamine Dimercaprol Other iron products This medication may also interact with the following: Chloramphenicol Deferasirox This list may not describe all possible interactions. Give your health care provider a list of all the medicines, herbs, non-prescription drugs, or dietary supplements you use. Also tell them if you smoke,  drink alcohol, or use illegal drugs. Some items may interact with your medicine. What should I watch for while using this medication? Visit your care team regularly. Tell your care team if your symptoms do not start to get better or if they get worse. You may need blood work done while you are taking this medication. You may need to follow a special diet. Talk to your care team. Foods that contain iron include: whole grains/cereals, dried fruits, beans, or peas, leafy green vegetables, and organ meats (liver, kidney). What side effects may I notice from receiving this medication? Side effects that you should report to your care team as soon as possible: Allergic reactions--skin rash, itching, hives, swelling of the face, lips, tongue, or throat Low blood pressure--dizziness, feeling faint or lightheaded, blurry vision Shortness of breath Side effects that usually do not require medical attention (report to your care team if they continue or are bothersome): Flushing Headache Joint pain Muscle pain Nausea Pain, redness, or irritation at injection site This list may not describe all possible side effects. Call your doctor for medical advice about side effects. You may report side effects to FDA at 1-800-FDA-1088. Where should I keep my medication? This medication is given in a hospital or clinic. It will not be stored at home. NOTE: This sheet is a summary. It may not cover all possible information. If you have questions about this medicine, talk to your doctor, pharmacist, or health care provider.  2024 Elsevier/Gold Standard (2023-03-01 00:00:00)

## 2023-07-06 ENCOUNTER — Encounter: Payer: Self-pay | Admitting: Internal Medicine

## 2023-07-09 ENCOUNTER — Inpatient Hospital Stay: Payer: 59 | Attending: Internal Medicine

## 2023-07-09 VITALS — BP 97/68 | HR 78 | Temp 97.5°F

## 2023-07-09 DIAGNOSIS — D631 Anemia in chronic kidney disease: Secondary | ICD-10-CM | POA: Diagnosis present

## 2023-07-09 DIAGNOSIS — N1831 Chronic kidney disease, stage 3a: Secondary | ICD-10-CM | POA: Insufficient documentation

## 2023-07-09 DIAGNOSIS — Z79899 Other long term (current) drug therapy: Secondary | ICD-10-CM | POA: Insufficient documentation

## 2023-07-09 MED ORDER — SODIUM CHLORIDE 0.9 % IV SOLN
Freq: Once | INTRAVENOUS | Status: AC
  Filled 2023-07-09: qty 250

## 2023-07-09 MED ORDER — SODIUM CHLORIDE 0.9 % IV SOLN
200.0000 mg | Freq: Once | INTRAVENOUS | Status: AC
  Administered 2023-07-09: 200 mg via INTRAVENOUS
  Filled 2023-07-09: qty 200

## 2023-07-16 ENCOUNTER — Inpatient Hospital Stay: Payer: 59

## 2023-07-17 ENCOUNTER — Inpatient Hospital Stay: Payer: 59

## 2023-07-17 VITALS — BP 107/62 | HR 69 | Temp 97.8°F | Resp 18

## 2023-07-17 DIAGNOSIS — D631 Anemia in chronic kidney disease: Secondary | ICD-10-CM

## 2023-07-17 DIAGNOSIS — N1831 Chronic kidney disease, stage 3a: Secondary | ICD-10-CM | POA: Diagnosis not present

## 2023-07-17 MED ORDER — SODIUM CHLORIDE 0.9 % IV SOLN
Freq: Once | INTRAVENOUS | Status: AC
  Filled 2023-07-17: qty 250

## 2023-07-17 MED ORDER — SODIUM CHLORIDE 0.9 % IV SOLN
200.0000 mg | Freq: Once | INTRAVENOUS | Status: AC
  Administered 2023-07-17: 200 mg via INTRAVENOUS
  Filled 2023-07-17: qty 200

## 2023-07-17 NOTE — Patient Instructions (Signed)
Iron Sucrose Injection What is this medication? IRON SUCROSE (EYE ern SOO krose) treats low levels of iron (iron deficiency anemia) in people with kidney disease. Iron is a mineral that plays an important role in making red blood cells, which carry oxygen from your lungs to the rest of your body. This medicine may be used for other purposes; ask your health care provider or pharmacist if you have questions. COMMON BRAND NAME(S): Venofer What should I tell my care team before I take this medication? They need to know if you have any of these conditions: Anemia not caused by low iron levels Heart disease High levels of iron in the blood Kidney disease Liver disease An unusual or allergic reaction to iron, other medications, foods, dyes, or preservatives Pregnant or trying to get pregnant Breastfeeding How should I use this medication? This medication is for infusion into a vein. It is given in a hospital or clinic setting. Talk to your care team about the use of this medication in children. While this medication may be prescribed for children as young as 2 years for selected conditions, precautions do apply. Overdosage: If you think you have taken too much of this medicine contact a poison control center or emergency room at once. NOTE: This medicine is only for you. Do not share this medicine with others. What if I miss a dose? Keep appointments for follow-up doses. It is important not to miss your dose. Call your care team if you are unable to keep an appointment. What may interact with this medication? Do not take this medication with any of the following: Deferoxamine Dimercaprol Other iron products This medication may also interact with the following: Chloramphenicol Deferasirox This list may not describe all possible interactions. Give your health care provider a list of all the medicines, herbs, non-prescription drugs, or dietary supplements you use. Also tell them if you smoke,  drink alcohol, or use illegal drugs. Some items may interact with your medicine. What should I watch for while using this medication? Visit your care team regularly. Tell your care team if your symptoms do not start to get better or if they get worse. You may need blood work done while you are taking this medication. You may need to follow a special diet. Talk to your care team. Foods that contain iron include: whole grains/cereals, dried fruits, beans, or peas, leafy green vegetables, and organ meats (liver, kidney). What side effects may I notice from receiving this medication? Side effects that you should report to your care team as soon as possible: Allergic reactions--skin rash, itching, hives, swelling of the face, lips, tongue, or throat Low blood pressure--dizziness, feeling faint or lightheaded, blurry vision Shortness of breath Side effects that usually do not require medical attention (report to your care team if they continue or are bothersome): Flushing Headache Joint pain Muscle pain Nausea Pain, redness, or irritation at injection site This list may not describe all possible side effects. Call your doctor for medical advice about side effects. You may report side effects to FDA at 1-800-FDA-1088. Where should I keep my medication? This medication is given in a hospital or clinic. It will not be stored at home. NOTE: This sheet is a summary. It may not cover all possible information. If you have questions about this medicine, talk to your doctor, pharmacist, or health care provider.  2024 Elsevier/Gold Standard (2023-03-01 00:00:00)

## 2023-07-23 ENCOUNTER — Inpatient Hospital Stay: Payer: 59

## 2023-07-23 ENCOUNTER — Inpatient Hospital Stay (HOSPITAL_BASED_OUTPATIENT_CLINIC_OR_DEPARTMENT_OTHER): Payer: 59 | Admitting: Nurse Practitioner

## 2023-07-23 ENCOUNTER — Encounter: Payer: Self-pay | Admitting: Nurse Practitioner

## 2023-07-23 VITALS — BP 110/50 | HR 70

## 2023-07-23 VITALS — BP 108/42 | HR 71 | Temp 97.8°F | Ht 59.0 in | Wt 174.0 lb

## 2023-07-23 DIAGNOSIS — N1832 Chronic kidney disease, stage 3b: Secondary | ICD-10-CM

## 2023-07-23 DIAGNOSIS — D631 Anemia in chronic kidney disease: Secondary | ICD-10-CM

## 2023-07-23 DIAGNOSIS — N1831 Chronic kidney disease, stage 3a: Secondary | ICD-10-CM | POA: Diagnosis not present

## 2023-07-23 DIAGNOSIS — D509 Iron deficiency anemia, unspecified: Secondary | ICD-10-CM

## 2023-07-23 LAB — COMPREHENSIVE METABOLIC PANEL
ALT: 13 U/L (ref 0–44)
AST: 25 U/L (ref 15–41)
Albumin: 4 g/dL (ref 3.5–5.0)
Alkaline Phosphatase: 68 U/L (ref 38–126)
Anion gap: 8 (ref 5–15)
BUN: 17 mg/dL (ref 8–23)
CO2: 22 mmol/L (ref 22–32)
Calcium: 8.8 mg/dL — ABNORMAL LOW (ref 8.9–10.3)
Chloride: 109 mmol/L (ref 98–111)
Creatinine, Ser: 1.16 mg/dL — ABNORMAL HIGH (ref 0.44–1.00)
GFR, Estimated: 51 mL/min — ABNORMAL LOW (ref >60)
Glucose, Bld: 64 mg/dL — ABNORMAL LOW (ref 70–99)
Potassium: 4.1 mmol/L (ref 3.5–5.1)
Sodium: 139 mmol/L (ref 135–145)
Total Bilirubin: 0.4 mg/dL (ref 0.3–1.2)
Total Protein: 6.9 g/dL (ref 6.5–8.1)

## 2023-07-23 LAB — FERRITIN: Ferritin: 159 ng/mL (ref 11–307)

## 2023-07-23 LAB — CBC WITH DIFFERENTIAL/PLATELET
Abs Immature Granulocytes: 0.04 10*3/uL (ref 0.00–0.07)
Basophils Absolute: 0.1 10*3/uL (ref 0.0–0.1)
Basophils Relative: 1 %
Eosinophils Absolute: 0.2 10*3/uL (ref 0.0–0.5)
Eosinophils Relative: 3 %
HCT: 31.2 % — ABNORMAL LOW (ref 36.0–46.0)
Hemoglobin: 9.1 g/dL — ABNORMAL LOW (ref 12.0–15.0)
Immature Granulocytes: 1 %
Lymphocytes Relative: 14 %
Lymphs Abs: 1.2 10*3/uL (ref 0.7–4.0)
MCH: 28.4 pg (ref 26.0–34.0)
MCHC: 29.2 g/dL — ABNORMAL LOW (ref 30.0–36.0)
MCV: 97.5 fL (ref 80.0–100.0)
Monocytes Absolute: 0.6 10*3/uL (ref 0.1–1.0)
Monocytes Relative: 7 %
Neutro Abs: 6.7 10*3/uL (ref 1.7–7.7)
Neutrophils Relative %: 74 %
Platelets: 370 10*3/uL (ref 150–400)
RBC: 3.2 MIL/uL — ABNORMAL LOW (ref 3.87–5.11)
RDW: 16.1 % — ABNORMAL HIGH (ref 11.5–15.5)
WBC: 8.9 10*3/uL (ref 4.0–10.5)
nRBC: 0 % (ref 0.0–0.2)

## 2023-07-23 LAB — IRON AND TIBC
Iron: 26 ug/dL — ABNORMAL LOW (ref 28–170)
Saturation Ratios: 7 % — ABNORMAL LOW (ref 10.4–31.8)
TIBC: 399 ug/dL (ref 250–450)
UIBC: 373 ug/dL

## 2023-07-23 LAB — SAMPLE TO BLOOD BANK

## 2023-07-23 MED ORDER — SODIUM CHLORIDE 0.9 % IV SOLN
200.0000 mg | Freq: Once | INTRAVENOUS | Status: AC
  Administered 2023-07-23: 200 mg via INTRAVENOUS
  Filled 2023-07-23: qty 200

## 2023-07-23 MED ORDER — SODIUM CHLORIDE 0.9 % IV SOLN
Freq: Once | INTRAVENOUS | Status: AC
  Filled 2023-07-23: qty 250

## 2023-07-23 MED ORDER — SODIUM CHLORIDE 0.9% FLUSH
10.0000 mL | Freq: Once | INTRAVENOUS | Status: AC | PRN
  Administered 2023-07-23: 10 mL
  Filled 2023-07-23: qty 10

## 2023-07-23 NOTE — Patient Instructions (Signed)
Iron Sucrose Injection What is this medication? IRON SUCROSE (EYE ern SOO krose) treats low levels of iron (iron deficiency anemia) in people with kidney disease. Iron is a mineral that plays an important role in making red blood cells, which carry oxygen from your lungs to the rest of your body. This medicine may be used for other purposes; ask your health care provider or pharmacist if you have questions. COMMON BRAND NAME(S): Venofer What should I tell my care team before I take this medication? They need to know if you have any of these conditions: Anemia not caused by low iron levels Heart disease High levels of iron in the blood Kidney disease Liver disease An unusual or allergic reaction to iron, other medications, foods, dyes, or preservatives Pregnant or trying to get pregnant Breastfeeding How should I use this medication? This medication is for infusion into a vein. It is given in a hospital or clinic setting. Talk to your care team about the use of this medication in children. While this medication may be prescribed for children as young as 2 years for selected conditions, precautions do apply. Overdosage: If you think you have taken too much of this medicine contact a poison control center or emergency room at once. NOTE: This medicine is only for you. Do not share this medicine with others. What if I miss a dose? Keep appointments for follow-up doses. It is important not to miss your dose. Call your care team if you are unable to keep an appointment. What may interact with this medication? Do not take this medication with any of the following: Deferoxamine Dimercaprol Other iron products This medication may also interact with the following: Chloramphenicol Deferasirox This list may not describe all possible interactions. Give your health care provider a list of all the medicines, herbs, non-prescription drugs, or dietary supplements you use. Also tell them if you smoke,  drink alcohol, or use illegal drugs. Some items may interact with your medicine. What should I watch for while using this medication? Visit your care team regularly. Tell your care team if your symptoms do not start to get better or if they get worse. You may need blood work done while you are taking this medication. You may need to follow a special diet. Talk to your care team. Foods that contain iron include: whole grains/cereals, dried fruits, beans, or peas, leafy green vegetables, and organ meats (liver, kidney). What side effects may I notice from receiving this medication? Side effects that you should report to your care team as soon as possible: Allergic reactions--skin rash, itching, hives, swelling of the face, lips, tongue, or throat Low blood pressure--dizziness, feeling faint or lightheaded, blurry vision Shortness of breath Side effects that usually do not require medical attention (report to your care team if they continue or are bothersome): Flushing Headache Joint pain Muscle pain Nausea Pain, redness, or irritation at injection site This list may not describe all possible side effects. Call your doctor for medical advice about side effects. You may report side effects to FDA at 1-800-FDA-1088. Where should I keep my medication? This medication is given in a hospital or clinic. It will not be stored at home. NOTE: This sheet is a summary. It may not cover all possible information. If you have questions about this medicine, talk to your doctor, pharmacist, or health care provider.  2024 Elsevier/Gold Standard (2023-03-01 00:00:00)

## 2023-07-23 NOTE — Progress Notes (Signed)
Fatigue/weakness: yes Dyspena: yes Light headedness: yes Blood in stool: no

## 2023-07-23 NOTE — Progress Notes (Signed)
Patient tolerated Venofer infusion well. Explained recommendation of 30 min post monitoring. Patient refused to wait post monitoring. Educated on what signs to watch for & to call with any concerns. No question, discharged. Stable

## 2023-07-23 NOTE — Progress Notes (Signed)
[facility] CONSULT NOTE  Patient Care Team: Barbette Reichmann, MD as PCP - General (Internal Medicine) Earna Coder, MD as Consulting Physician (Hematology and Oncology)  CHIEF COMPLAINTS/PURPOSE OF CONSULTATION: Anemia   HEMATOLOGY HISTORY  # Chronic kidney disease-stage III/ ANEMIA: EGD [~10 y]/colonoscopy-postponed in april;[March 2021- Hb ~10.3; Iron sat- 15%] ; recommend PO iron; HOLD IV Iron.   # CKD [Dr.Lateef]-GFR 40s.    HISTORY OF PRESENTING ILLNESS:  [name] [name] 72 y.o.  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 She continues oral iron and endorses black stools. Otherwise she's unaware of blood loss. Energy has improved. Tolerating iron well.   Review of Systems  Constitutional:  Positive for malaise/fatigue. Negative for chills, diaphoresis, fever and weight loss.  HENT:  Negative for nosebleeds and sore throat.   Eyes:  Negative for double vision.  Respiratory:  Negative for cough, hemoptysis, sputum production, shortness of breath and wheezing.   Cardiovascular:  Negative for chest pain, palpitations, orthopnea and leg swelling.  Gastrointestinal:  Negative for abdominal pain, blood in stool, constipation, diarrhea, heartburn, melena, nausea and vomiting.  Genitourinary:  Negative for dysuria, frequency and urgency.  Musculoskeletal:  Positive for back pain and joint pain.  Skin: Negative.  Negative for itching and rash.  Neurological:  Negative for dizziness, tingling, focal weakness, weakness and headaches.  Endo/Heme/Allergies:  Does not bruise/bleed easily.  Psychiatric/Behavioral:  Negative for depression. The patient is not nervous/anxious and does not have insomnia.     MEDICAL HISTORY:  Past Medical History:  Diagnosis Date   Anemia    Asthma    Chronic airway obstruction (HCC)    Chronic kidney disease    F/U WITH DR LATEEF   Diabetes mellitus without  complication (HCC)    Dyspnea    WITH EXERTION   GERD (gastroesophageal reflux disease)    Heart murmur    ASYMPTOMATIC   History of adenomatous polyp of colon    History of bone density study    Hyperlipidemia    Hypertension    Idiopathic peripheral neuropathy    Morbid obesity (HCC)    40.0-44.9   Osteoporosis    Plantar fascial fibromatosis    Pulmonary fibrosis (HCC)    Sleep apnea    TIA (transient ischemic attack)     SURGICAL HISTORY: Past Surgical History:  Procedure Laterality Date   ABDOMINAL HYSTERECTOMY     BACK SURGERY     BLADDER SUSPENSION     CESAREAN SECTION     COLONOSCOPY     COLONOSCOPY N/A 06/29/2021   Procedure: COLONOSCOPY;  Surgeon: Toledo, Boykin Nearing, MD;  Location: [facility] END[facility]COPY;  Service: Gastroenterology;  Laterality: N/A;   COLONOSCOPY WITH PROPOFOL N/A 08/30/2020   Procedure: COLONOSCOPY WITH PROPOFOL;  Surgeon: Regis Bill, MD;  Location: [facility] ENDOSCOPY;  Service: Endoscopy;  Laterality: N/A;   DILATION AND CURETTAGE OF UTERUS     ESOPHAGOGASTRODUODENOSCOPY     ESOPHAGOGASTRODUODENOSCOPY N/A 06/29/2021   Procedure: ESOPHAGOGASTRODUODENOSCOPY (EGD);  Surgeon: Toledo, Boykin Nearing, MD;  Location: [facility] ENDOSCOPY;  Service: Gastroenterology;  Laterality: N/A;   ESOPHAGOGASTRODUODENOSCOPY (EGD) WITH PROPOFOL N/A 08/30/2020   Procedure: ESOPHAGOGASTRODUODENOSCOPY (EGD) WITH PROPOFOL;  Surgeon: Regis Bill, MD;  Location: [facility] ENDOS[facility];  Service: Endoscopy;  Laterality: N/A;   LUMBAR LAMINECTOMY/DECOMPRESSION MICRODISCECTOMY N/A 10/15/2016   Procedure: L3-L5 Laminectomy ;  Surgeon: Lucy Chris, MD;  Location: [facility] OR[facility];  Service: Neurosurgery;  Laterality: N/A;  LUMBAR WOUND DEBRIDEMENT N/A 11/05/2016   Procedure: LUMBAR WOUND DEBRIDEMENT;  Surgeon: Lucy Chris, MD;  Location: [facility] OR[facility];  Service: Neurosurgery;  Laterality: N/A;   REVERSE SHOULDER ARTHROPLASTY Right 05/05/2020   Procedure: REVERSE SHOULDER ARTHROPLASTY;  Surgeon: Christena Flake, MD;  Location: [facility];  Service: Orthopedics;  Laterality: Right;   REVERSE SHOULDER ARTHROPLASTY Left 02/23/2021   Procedure: REVERSE SHOULDER ARTHROPLASTY;  Surgeon: Christena Flake, MD;  Location: [facility];  Service: Orthopedics;  Laterality: Left;   TUBAL LIGATION     VEIN LIGATION AND STRIPPING      SOCIAL HISTORY: Social History   Socioeconomic History   Marital status: Single    Spouse name: Not on file   Number of children: Not on file   Years of education: Not on file   Highest education level: Not on file  Occupational History   Not on file  Tobacco Use   Smoking status: Former    Current packs/day: 0.00    Average packs/day: 3.0 packs/day for 30.0 years (90.0 ttl pk-yrs)    Types: Cigarettes    Start date: 04/28/1970    Quit date: 04/28/2000    Years since quitting: 23.2   Smokeless tobacco: Never  Vaping Use   Vaping status: Never Used  Substance and Sexual Activity   Alcohol use: No   Drug use: No   Sexual activity: Not on file  Other Topics Concern   Not on file  Social History Narrative   Lives in [place]; by self; walks with walker/gait instability; quit smoking 20 years ago; no alcohol.    Social Determinants of Health   Financial Resource Strain: Low Risk  (04/16/2023)   Received from [facility]   Overall Financial Resource Strain (CARDIA)    Difficulty of Paying Living Expenses: Not hard at all  Food Insecurity: Food Insecurity Present (04/16/2023)   [facility]   Hunger Vital Sign    Worried About Running Out of Food in the Last Year: Never true    Ran Out of Food in the Last Year: Sometimes true  Transportation Needs: No Transportation Needs (04/16/2023)   Received from [facility] - Transportation    In the past 12 months, has lack of transportation kept you from medical appointments or from getting medications?: No    Lack of Transportation (Non-Medical): No   Recent Concern: Transportation Needs - Unmet Transportation Needs (02/04/2023)   Received from [facility], [facility]   [facility] - Transportation    In the past 12 months, has lack of transportation kept you from medical appointments or from getting medications?: Yes    Lack of Transportation (Non-Medical): Yes  Physical Activity: Not on file  Stress: Not on file  Social Connections: Not on file  Intimate Partner Violence: Not on file    FAMILY HISTORY: Family History  Problem Relation Age of Onset   Stroke Mother    Heart attack Mother    Breast cancer Sister 29   Stroke Sister    Heart attack Sister    Breast cancer Cousin        maternal side 60's    ALLERGIES:  is allergic to codeine, furosemide, penicillin v potassium, quinine, tramadol, and tylenol [acetaminophen].  MEDICATIONS:  Current Outpatient Medications  Medication Sig Dispense Refill   clotrimazole-betamethasone (LOTRISONE) cream Apply 1 application topically 2 (two) times daily as needed (itching).  erythromycin ophthalmic ointment SMARTSIG:In Eye(s)     ferrous sulfate 325 (65 FE) MG tablet Take 1 tablet (325 mg total) by mouth 2 (two) times daily with a meal. 60 tablet 1   fluticasone-salmeterol (ADVAIR HFA) 115-21 MCG/ACT inhaler Inhale 2 puffs into the lungs 2 (two) times daily.     hydrocortisone 2.5 % cream Apply 1 application topically 2 (two) times daily. (Apply to face as spot treatment)     ketoconazole (NIZORAL) 2 % cream Apply 1 application  topically 2 (two) times daily. (Apply to areas on face)     losartan (COZAAR) 50 MG tablet Take 50 mg by mouth every morning.      Magnesium 400 MG TABS Take 1 tablet by mouth daily.     mometasone-formoterol (DULERA) 100-5 MCG/ACT AERO Inhale 2 puffs into the lungs 2 (two) times daily as needed for wheezing or shortness of breath.     oxyCODONE (OXY IR/ROXICODONE) 5 MG immediate release tablet Take 1-2 tablets (5-10 mg  total) by mouth every 4 (four) hours as needed for moderate pain. (Patient taking differently: Take 5 mg by mouth 2 (two) times daily as needed for moderate pain (pain score 4-6) or severe pain (pain score 7-10).) 60 tablet 0   albuterol (VENTOLIN HFA) 108 (90 Base) MCG/ACT inhaler Inhale 2 puffs into the lungs every 6 (six) hours as needed for wheezing or shortness of breath. (Patient not taking: Reported on 11/30/2022)     diclofenac Sodium (VOLTAREN) 1 % GEL Apply topically 4 (four) times daily. (Patient not taking: Reported on 11/30/2022)     pantoprazole (PROTONIX) 40 MG tablet Take 1 tablet (40 mg total) by mouth daily. 30 tablet 1   No current facility-administered medications for this visit.    PHYSICAL EXAMINATION: Vitals:   07/23/23 1357  BP: (!) 108/42  Pulse: 71  Temp: 97.8 F (36.6 C)  SpO2: 100%   Filed Weights   07/23/23 1357  Weight: 174 lb (78.9 kg)   Physical Exam Vitals reviewed.  Constitutional:      Appearance: She is obese. She is not ill-appearing.     Comments: In wheelchair. Unaccompanied.   Cardiovascular:     Rate and Rhythm: Normal rate and regular rhythm.  Pulmonary:     Effort: Pulmonary effort is normal. No respiratory distress.     Breath sounds: No wheezing.  Abdominal:     General: There is no distension.     Palpations: Abdomen is soft.     Tenderness: There is no abdominal tenderness. There is no guarding.  Musculoskeletal:        General: No tenderness.  Skin:    General: Skin is warm and dry.     Coloration: Skin is not pale.  Neurological:     Mental Status: She is alert and oriented to person, place, and time.  Psychiatric:        Mood and Affect: Mood and affect normal.        Behavior: Behavior normal.    LABORATORY DATA:  I have reviewed the data as listed Lab Results  Component Value Date   WBC 8.9 07/23/2023   HGB 9.1 (L) 07/23/2023   HCT 31.2 (L) 07/23/2023   MCV 97.5 07/23/2023   PLT 370 07/23/2023    Iron/TIBC/Ferritin/ %Sat    Component Value Date/Time   IRON 26 (L) 07/23/2023 1400   TIBC 399 07/23/2023 1400   FERRITIN 159 07/23/2023 1400   IRONPCTSAT 7 (L) 07/23/2023 1400  Latest Ref Rng & Units 07/23/2023    2:00 PM 06/25/2023    2:04 PM 03/25/2023    2:24 PM  CMP  Glucose 70 - 99 mg/dL 64  469  65   BUN 8 - 23 mg/dL 17  27  22    Creatinine 0.44 - 1.00 mg/dL 6.29  5.28  4.13   Sodium 135 - 145 mmol/L 139  135  139   Potassium 3.5 - 5.1 mmol/L 4.1  4.2  4.3   Chloride 98 - 111 mmol/L 109  106  108   CO2 22 - 32 mmol/L 22  23  23    Calcium 8.9 - 10.3 mg/dL 8.8  8.7  8.7   Total Protein 6.5 - 8.1 g/dL 6.9  6.6  6.9   Total Bilirubin 0.3 - 1.2 mg/dL 0.4  0.3  0.4   Alkaline Phos 38 - 126 U/L 68  66  76   AST 15 - 41 U/L 25  19  23    ALT 0 - 44 U/L 13  10  11     No results found.  Assessment & Plan:   Anemia- secondary to Chronic Kidney Disease (nephrology March 2021) vs Iron Def/blood loss (epistaxis vs Melena vs others). June 2023- hmg 9, iron sat 15%. On PO iron daily. Tolerated well. Received venofer x 3 in June. Hemoglobin had worsened to 7.5. Declined transfusion d/t asymptomatic. Received venofer x 5. Hemoglobin has improved to 9.1 today. Persistent low iron sat. Ferritin likely reactive. Last venofer 07/17/23. Proceed with venofer today.  Melena- Last colonoscopy and endoscopy with Dr. Norma Fredrickson 06/2021. Diverticula noted. Large adenoma. 1cm hiatal hernia. Gastritis. No capsule. She's now being followed by Dr. Mia Creek. Saw [employer]. She has colonoscopy later this year.  Epistaxis- platelets are normal. May be related to antihistamines/allergies or others. Recommend she follow up with pcp and if ongoing concerns, consider evaluation with ENT.  CKD- stage IIIa. GFR 40-50s-diabetes/hypertension. Followed by nephrology [Dr.Lateef]. Reviewed etiology of anemia in CKD. Recommend optimizing iron stores. If hemoglobin remains < 10, could consider starting epo.   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 Offered counseling or psychiatry but patient declines.    DISPOSITION: Venofer today then 2 additional doses F/u as scheduled- la  No problem-specific Assessment & Plan notes found for this encounter.  All questions were answered. The patient knows to call the clinic with any problems, questions or concerns.  Alinda Dooms, NP 07/23/2023

## 2023-07-31 ENCOUNTER — Inpatient Hospital Stay: Payer: 59

## 2023-07-31 VITALS — BP 103/42 | HR 72 | Temp 98.5°F | Resp 18

## 2023-07-31 DIAGNOSIS — N1832 Chronic kidney disease, stage 3b: Secondary | ICD-10-CM

## 2023-07-31 DIAGNOSIS — N1831 Chronic kidney disease, stage 3a: Secondary | ICD-10-CM | POA: Diagnosis not present

## 2023-07-31 MED ORDER — IRON SUCROSE 20 MG/ML IV SOLN
200.0000 mg | Freq: Once | INTRAVENOUS | Status: AC
Start: 2023-07-31 — End: 2023-07-31
  Administered 2023-07-31: 200 mg via INTRAVENOUS
  Filled 2023-07-31: qty 10

## 2023-07-31 NOTE — Progress Notes (Signed)
Declined 30 minute post observation. Aware of risks. Vitals stable at discharge.  

## 2023-07-31 NOTE — Patient Instructions (Signed)
Iron Sucrose Injection What is this medication? IRON SUCROSE (EYE ern SOO krose) treats low levels of iron (iron deficiency anemia) in people with kidney disease. Iron is a mineral that plays an important role in making red blood cells, which carry oxygen from your lungs to the rest of your body. This medicine may be used for other purposes; ask your health care provider or pharmacist if you have questions. COMMON BRAND NAME(S): Venofer What should I tell my care team before I take this medication? They need to know if you have any of these conditions: Anemia not caused by low iron levels Heart disease High levels of iron in the blood Kidney disease Liver disease An unusual or allergic reaction to iron, other medications, foods, dyes, or preservatives Pregnant or trying to get pregnant Breastfeeding How should I use this medication? This medication is for infusion into a vein. It is given in a hospital or clinic setting. Talk to your care team about the use of this medication in children. While this medication may be prescribed for children as young as 2 years for selected conditions, precautions do apply. Overdosage: If you think you have taken too much of this medicine contact a poison control center or emergency room at once. NOTE: This medicine is only for you. Do not share this medicine with others. What if I miss a dose? Keep appointments for follow-up doses. It is important not to miss your dose. Call your care team if you are unable to keep an appointment. What may interact with this medication? Do not take this medication with any of the following: Deferoxamine Dimercaprol Other iron products This medication may also interact with the following: Chloramphenicol Deferasirox This list may not describe all possible interactions. Give your health care provider a list of all the medicines, herbs, non-prescription drugs, or dietary supplements you use. Also tell them if you smoke,  drink alcohol, or use illegal drugs. Some items may interact with your medicine. What should I watch for while using this medication? Visit your care team regularly. Tell your care team if your symptoms do not start to get better or if they get worse. You may need blood work done while you are taking this medication. You may need to follow a special diet. Talk to your care team. Foods that contain iron include: whole grains/cereals, dried fruits, beans, or peas, leafy green vegetables, and organ meats (liver, kidney). What side effects may I notice from receiving this medication? Side effects that you should report to your care team as soon as possible: Allergic reactions--skin rash, itching, hives, swelling of the face, lips, tongue, or throat Low blood pressure--dizziness, feeling faint or lightheaded, blurry vision Shortness of breath Side effects that usually do not require medical attention (report to your care team if they continue or are bothersome): Flushing Headache Joint pain Muscle pain Nausea Pain, redness, or irritation at injection site This list may not describe all possible side effects. Call your doctor for medical advice about side effects. You may report side effects to FDA at 1-800-FDA-1088. Where should I keep my medication? This medication is given in a hospital or clinic. It will not be stored at home. NOTE: This sheet is a summary. It may not cover all possible information. If you have questions about this medicine, talk to your doctor, pharmacist, or health care provider.  2024 Elsevier/Gold Standard (2023-03-01 00:00:00)

## 2023-08-07 ENCOUNTER — Inpatient Hospital Stay: Payer: 59

## 2023-08-07 VITALS — BP 135/75 | HR 76 | Temp 97.8°F | Resp 17

## 2023-08-07 DIAGNOSIS — N1832 Chronic kidney disease, stage 3b: Secondary | ICD-10-CM

## 2023-08-07 DIAGNOSIS — N1831 Chronic kidney disease, stage 3a: Secondary | ICD-10-CM | POA: Diagnosis not present

## 2023-08-07 MED ORDER — IRON SUCROSE 20 MG/ML IV SOLN
200.0000 mg | Freq: Once | INTRAVENOUS | Status: AC
  Administered 2023-08-07: 200 mg via INTRAVENOUS
  Filled 2023-08-07: qty 10

## 2023-08-07 MED ORDER — SODIUM CHLORIDE 0.9% FLUSH
10.0000 mL | Freq: Once | INTRAVENOUS | Status: AC | PRN
  Administered 2023-08-07: 10 mL
  Filled 2023-08-07: qty 10

## 2023-08-07 NOTE — Progress Notes (Signed)
Patient tolerated Venofer infusion well. Explained recommendation of 30 min post monitoring. Patient refused to wait post monitoring. Educated on what signs to watch for & to call with any concerns. No questions, discharged. Stable  

## 2023-08-07 NOTE — Patient Instructions (Signed)
Iron Sucrose Injection What is this medication? IRON SUCROSE (EYE ern SOO krose) treats low levels of iron (iron deficiency anemia) in people with kidney disease. Iron is a mineral that plays an important role in making red blood cells, which carry oxygen from your lungs to the rest of your body. This medicine may be used for other purposes; ask your health care provider or pharmacist if you have questions. COMMON BRAND NAME(S): Venofer What should I tell my care team before I take this medication? They need to know if you have any of these conditions: Anemia not caused by low iron levels Heart disease High levels of iron in the blood Kidney disease Liver disease An unusual or allergic reaction to iron, other medications, foods, dyes, or preservatives Pregnant or trying to get pregnant Breastfeeding How should I use this medication? This medication is for infusion into a vein. It is given in a hospital or clinic setting. Talk to your care team about the use of this medication in children. While this medication may be prescribed for children as young as 2 years for selected conditions, precautions do apply. Overdosage: If you think you have taken too much of this medicine contact a poison control center or emergency room at once. NOTE: This medicine is only for you. Do not share this medicine with others. What if I miss a dose? Keep appointments for follow-up doses. It is important not to miss your dose. Call your care team if you are unable to keep an appointment. What may interact with this medication? Do not take this medication with any of the following: Deferoxamine Dimercaprol Other iron products This medication may also interact with the following: Chloramphenicol Deferasirox This list may not describe all possible interactions. Give your health care provider a list of all the medicines, herbs, non-prescription drugs, or dietary supplements you use. Also tell them if you smoke,  drink alcohol, or use illegal drugs. Some items may interact with your medicine. What should I watch for while using this medication? Visit your care team regularly. Tell your care team if your symptoms do not start to get better or if they get worse. You may need blood work done while you are taking this medication. You may need to follow a special diet. Talk to your care team. Foods that contain iron include: whole grains/cereals, dried fruits, beans, or peas, leafy green vegetables, and organ meats (liver, kidney). What side effects may I notice from receiving this medication? Side effects that you should report to your care team as soon as possible: Allergic reactions--skin rash, itching, hives, swelling of the face, lips, tongue, or throat Low blood pressure--dizziness, feeling faint or lightheaded, blurry vision Shortness of breath Side effects that usually do not require medical attention (report to your care team if they continue or are bothersome): Flushing Headache Joint pain Muscle pain Nausea Pain, redness, or irritation at injection site This list may not describe all possible side effects. Call your doctor for medical advice about side effects. You may report side effects to FDA at 1-800-FDA-1088. Where should I keep my medication? This medication is given in a hospital or clinic. It will not be stored at home. NOTE: This sheet is a summary. It may not cover all possible information. If you have questions about this medicine, talk to your doctor, pharmacist, or health care provider.  2024 Elsevier/Gold Standard (2023-03-01 00:00:00)

## 2023-08-20 ENCOUNTER — Encounter: Payer: Self-pay | Admitting: Podiatry

## 2023-08-20 ENCOUNTER — Ambulatory Visit (INDEPENDENT_AMBULATORY_CARE_PROVIDER_SITE_OTHER): Payer: 59 | Admitting: Podiatry

## 2023-08-20 VITALS — Ht 59.0 in | Wt 174.0 lb

## 2023-08-20 DIAGNOSIS — M79675 Pain in left toe(s): Secondary | ICD-10-CM

## 2023-08-20 DIAGNOSIS — D2372 Other benign neoplasm of skin of left lower limb, including hip: Secondary | ICD-10-CM | POA: Diagnosis not present

## 2023-08-20 DIAGNOSIS — B351 Tinea unguium: Secondary | ICD-10-CM | POA: Diagnosis not present

## 2023-08-20 DIAGNOSIS — M79674 Pain in right toe(s): Secondary | ICD-10-CM | POA: Diagnosis not present

## 2023-08-20 NOTE — Progress Notes (Signed)
   Chief Complaint  Patient presents with   Diabetes    Patient is here for routine [facility]    SUBJECTIVE Patient presents to office today complaining of elongated, thickened nails that cause pain while ambulating in shoes.  Patient is unable to trim their own nails.  Patient also has a symptomatic skin lesion to the plantar aspect of the fifth MTP left foot patient is here for further evaluation and treatment.  Past Medical History:  Diagnosis Date   Anemia    Asthma    Chronic airway obstruction (HCC)    Chronic kidney disease    F/U WITH DR LATEEF   Diabetes mellitus without complication (HCC)    Dyspnea    WITH EXERTION   GERD (gastroesophageal reflux disease)    Heart murmur    ASYMPTOMATIC   History of adenomatous polyp of colon    History of bone density study    Hyperlipidemia    Hypertension    Idiopathic peripheral neuropathy    Morbid obesity (HCC)    40.0-44.9   Osteoporosis    Plantar fascial fibromatosis    Pulmonary fibrosis (HCC)    Sleep apnea    TIA (transient ischemic attack)     OBJECTIVE General Patient is awake, alert, and oriented x 3 and in no acute distress. Derm Skin is dry and supple bilateral. Negative open lesions or macerations. Remaining integument unremarkable. Nails are tender, long, thickened and dystrophic with subungual debris, consistent with onychomycosis, 1-5 bilateral. No signs of infection noted.  Hyperkeratotic skin lesions also noted to the fifth MTP left foot with a central nucleated core.  No open wounds Vasc  DP and PT pedal pulses palpable bilaterally. Temperature gradient within normal limits.  Neuro light touch and protective threshold sensation grossly intact bilaterally.  Musculoskeletal Exam No symptomatic pedal deformities noted bilateral. Muscular strength within normal limits.  ASSESSMENT 1.  Pain due to onychomycosis of toenails both 2.  Eccrine poroma fifth MTP left  PLAN OF CARE -Patient evaluated -Instructed to  maintain good pedal hygiene and foot care.  Advised against going barefoot.  Patient admits to going barefoot around the house -Mechanical debridement of nails 1-5 bilaterally performed using a nail nipper. Filed with dremel without incident.  -Excisional debridement of the hyperkeratotic preulcerative callus tissue was also performed today using a 312 scalpel without incident or bleeding.  Salicylic acid with a light dressing applied -Return to clinic 3 months y   Felecia Shelling, DPM [facility]  Dr. Felecia Shelling, DPM    [address] [place], [place] [address]                Office [phone]  Fax [phone]

## 2023-08-26 DIAGNOSIS — I700 Atherosclerosis of aorta: Secondary | ICD-10-CM | POA: Insufficient documentation

## 2023-08-26 DIAGNOSIS — K862 Cyst of pancreas: Secondary | ICD-10-CM | POA: Insufficient documentation

## 2023-09-03 ENCOUNTER — Encounter: Payer: Self-pay | Admitting: *Deleted

## 2023-09-24 ENCOUNTER — Inpatient Hospital Stay (HOSPITAL_BASED_OUTPATIENT_CLINIC_OR_DEPARTMENT_OTHER): Payer: 59 | Admitting: Internal Medicine

## 2023-09-24 ENCOUNTER — Inpatient Hospital Stay: Payer: 59 | Attending: Internal Medicine

## 2023-09-24 ENCOUNTER — Encounter: Payer: Self-pay | Admitting: Internal Medicine

## 2023-09-24 ENCOUNTER — Inpatient Hospital Stay: Payer: 59

## 2023-09-24 VITALS — BP 118/77 | HR 70

## 2023-09-24 VITALS — BP 116/63 | HR 71 | Temp 97.6°F | Resp 18 | Wt 174.0 lb

## 2023-09-24 DIAGNOSIS — Z87891 Personal history of nicotine dependence: Secondary | ICD-10-CM | POA: Diagnosis not present

## 2023-09-24 DIAGNOSIS — E1122 Type 2 diabetes mellitus with diabetic chronic kidney disease: Secondary | ICD-10-CM | POA: Diagnosis not present

## 2023-09-24 DIAGNOSIS — D631 Anemia in chronic kidney disease: Secondary | ICD-10-CM

## 2023-09-24 DIAGNOSIS — D649 Anemia, unspecified: Secondary | ICD-10-CM | POA: Diagnosis not present

## 2023-09-24 DIAGNOSIS — Z79899 Other long term (current) drug therapy: Secondary | ICD-10-CM | POA: Insufficient documentation

## 2023-09-24 DIAGNOSIS — I129 Hypertensive chronic kidney disease with stage 1 through stage 4 chronic kidney disease, or unspecified chronic kidney disease: Secondary | ICD-10-CM | POA: Diagnosis not present

## 2023-09-24 DIAGNOSIS — D509 Iron deficiency anemia, unspecified: Secondary | ICD-10-CM

## 2023-09-24 DIAGNOSIS — N1831 Chronic kidney disease, stage 3a: Secondary | ICD-10-CM | POA: Insufficient documentation

## 2023-09-24 LAB — COMPREHENSIVE METABOLIC PANEL
ALT: 13 U/L (ref 0–44)
AST: 22 U/L (ref 15–41)
Albumin: 3.8 g/dL (ref 3.5–5.0)
Alkaline Phosphatase: 72 U/L (ref 38–126)
Anion gap: 8 (ref 5–15)
BUN: 18 mg/dL (ref 8–23)
CO2: 22 mmol/L (ref 22–32)
Calcium: 8.5 mg/dL — ABNORMAL LOW (ref 8.9–10.3)
Chloride: 106 mmol/L (ref 98–111)
Creatinine, Ser: 1.09 mg/dL — ABNORMAL HIGH (ref 0.44–1.00)
GFR, Estimated: 55 mL/min — ABNORMAL LOW (ref >60)
Glucose, Bld: 88 mg/dL (ref 70–99)
Potassium: 3.9 mmol/L (ref 3.5–5.1)
Sodium: 136 mmol/L (ref 135–145)
Total Bilirubin: 0.3 mg/dL (ref <1.2)
Total Protein: 6.2 g/dL — ABNORMAL LOW (ref 6.5–8.1)

## 2023-09-24 LAB — CBC WITH DIFFERENTIAL/PLATELET
Abs Immature Granulocytes: 0.03 10*3/uL (ref 0.00–0.07)
Basophils Absolute: 0.1 10*3/uL (ref 0.0–0.1)
Basophils Relative: 1 %
Eosinophils Absolute: 0.3 10*3/uL (ref 0.0–0.5)
Eosinophils Relative: 3 %
HCT: 28.1 % — ABNORMAL LOW (ref 36.0–46.0)
Hemoglobin: 8.4 g/dL — ABNORMAL LOW (ref 12.0–15.0)
Immature Granulocytes: 0 %
Lymphocytes Relative: 15 %
Lymphs Abs: 1.1 10*3/uL (ref 0.7–4.0)
MCH: 27.8 pg (ref 26.0–34.0)
MCHC: 29.9 g/dL — ABNORMAL LOW (ref 30.0–36.0)
MCV: 93 fL (ref 80.0–100.0)
Monocytes Absolute: 0.6 10*3/uL (ref 0.1–1.0)
Monocytes Relative: 8 %
Neutro Abs: 5.4 10*3/uL (ref 1.7–7.7)
Neutrophils Relative %: 73 %
Platelets: 340 10*3/uL (ref 150–400)
RBC: 3.02 MIL/uL — ABNORMAL LOW (ref 3.87–5.11)
RDW: 15.1 % (ref 11.5–15.5)
WBC: 7.5 10*3/uL (ref 4.0–10.5)
nRBC: 0 % (ref 0.0–0.2)

## 2023-09-24 LAB — IRON AND TIBC
Iron: 120 ug/dL (ref 28–170)
Saturation Ratios: 32 % — ABNORMAL HIGH (ref 10.4–31.8)
TIBC: 378 ug/dL (ref 250–450)
UIBC: 258 ug/dL

## 2023-09-24 LAB — SAMPLE TO BLOOD BANK

## 2023-09-24 LAB — FERRITIN: Ferritin: 67 ng/mL (ref 11–307)

## 2023-09-24 MED ORDER — IRON SUCROSE 20 MG/ML IV SOLN
200.0000 mg | Freq: Once | INTRAVENOUS | Status: AC
Start: 2023-09-24 — End: 2023-09-24
  Administered 2023-09-24: 200 mg via INTRAVENOUS
  Filled 2023-09-24: qty 10

## 2023-09-24 MED ORDER — SODIUM CHLORIDE 0.9% FLUSH
10.0000 mL | Freq: Once | INTRAVENOUS | Status: AC | PRN
  Administered 2023-09-24: 10 mL
  Filled 2023-09-24: qty 10

## 2023-09-24 NOTE — Progress Notes (Signed)
Patient declined to wait the 30 minutes for post iron infusion observation today. Tolerated infusion well. VSS. 

## 2023-09-24 NOTE — Progress Notes (Signed)
[facility] CONSULT NOTE  Patient Care Team: Barbette Reichmann, MD as PCP - General (Internal Medicine) Earna Coder, MD as Consulting Physician (Hematology and Oncology)  CHIEF COMPLAINTS/PURPOSE OF CONSULTATION: Anemia   HEMATOLOGY HISTORY  #Chronic kidney disease-stage III/ ANEMIA: EGD [~10 y]/colonoscopy-postponed in april;[March 2021- Hb ~10.3; Iron sat- 15%] ; recommend PO iron; HOLD IV Iron.   # CKD [Dr.Lateef]-GFR 40s.    HISTORY OF PRESENTING ILLNESS:  Patient ambulating-in wheel chair  Alone/ transportation  [name] [name] 72 y.o.  female patient with iron deficient anemia-question CKD versus others is here for follow-up.  aving colonoscopy on 10/07/23. No blood in stool. Does have dyspnea with exertion. Denies any dizziness. In a wheelchair today.Denies pain. Has a good appetite.   Patient patient states that she has been diligently taking iron pills once a day.  Complains of on going fatigue.    No abdominal pain discomfort dyspepsia.  Review of Systems  Constitutional:  Positive for malaise/fatigue. Negative for chills, diaphoresis, fever and weight loss.  HENT:  Negative for nosebleeds and sore throat.   Eyes:  Negative for double vision.  Respiratory:  Negative for cough, hemoptysis, sputum production, shortness of breath and wheezing.   Cardiovascular:  Negative for chest pain, palpitations, orthopnea and leg swelling.  Gastrointestinal:  Negative for abdominal pain, blood in stool, constipation, diarrhea, heartburn, melena, nausea and vomiting.  Genitourinary:  Negative for dysuria, frequency and urgency.  Musculoskeletal:  Positive for back pain and joint pain.  Skin: Negative.  Negative for itching and rash.  Neurological:  Negative for dizziness, tingling, focal weakness, weakness and headaches.  Endo/Heme/Allergies:  Does not bruise/bleed easily.  Psychiatric/Behavioral:  Negative for depression. The patient is not nervous/anxious and  does not have insomnia.     MEDICAL HISTORY:  Past Medical History:  Diagnosis Date  . Anemia   . Asthma   . Chronic airway obstruction (HCC)   . Chronic kidney disease    F/U WITH DR LATEEF  . Diabetes mellitus without complication (HCC)   . Dyspnea    WITH EXERTION  . GERD (gastroesophageal reflux disease)   . Heart murmur    ASYMPTOMATIC  . History of adenomatous polyp of colon   . History of bone density study   . Hyperlipidemia   . Hypertension   . Idiopathic peripheral neuropathy   . Morbid obesity (HCC)    40.0-44.9  . Osteoporosis   . Plantar fascial fibromatosis   . Pulmonary fibrosis (HCC)   . Sleep apnea   . TIA (transient ischemic attack)     SURGICAL HISTORY: Past Surgical History:  Procedure Laterality Date  . ABDOMINAL HYSTERECTOMY    . BACK SURGERY    . BLADDER SUSPENSION    . CESAREAN SECTION    . COLONOSCOPY    . COLONOSCOPY N/A 06/29/2021   Procedure: COLONOSCOPY;  Surgeon: Toledo, Boykin Nearing, MD;  Location: [facility] ENDOSCOPY;  Service: Gastroenterology;  Laterality: N/A;  . COLONOSCOPY WITH PROPOFOL N/A 08/30/2020   Procedure: COLONOSCOPY WITH PROPOFOL;  Surgeon: Regis Bill, MD;  Location: [facility] ENDOSCOPY;  Service: Endoscopy;  Laterality: N/A;  . DILATION AND CURETTAGE OF UTERUS    . ESOPHAGOGASTRODUODENOSCOPY    . ESOPHAGOGASTRODUODENOSCOPY N/A 06/29/2021   Procedure: ESOPHAGOGASTRODUODENOSCOPY (EGD);  Surgeon: Toledo, Boykin Nearing, MD;  Location: [facility] ENDOSCOPY;  Service: Gastroenterology;  Laterality: N/A;  . ESOPHAGOGASTRODUODENOSCOPY (EGD) WITH PROPOFOL N/A 08/30/2020   Procedure: ESOPHAGOGASTRODUODENOSCOPY (EGD) WITH PROPOFOL;  Surgeon: Regis Bill, MD;  Location:  [facility] ENDOSCOPY;  Service: Endoscopy;  Laterality: N/A;  . LUMBAR LAMINECTOMY/DECOMPRESSION MICRODISCECTOMY N/A 10/15/2016   Procedure: L3-L5 Laminectomy ;  Surgeon: Lucy Chris, MD;  Location: [facility] ORS;  Service: Neurosurgery;  Laterality: N/A;  . LUMBAR WOUND  DEBRIDEMENT N/A 11/05/2016   Procedure: LUMBAR WOUND DEBRIDEMENT;  Surgeon: Lucy Chris, MD;  Location: [facility] OR[facility];  Service: Neurosurgery;  Laterality: N/A;  . REVERSE SHOULDER ARTHROPLASTY Right 05/05/2020   Procedure: REVERSE SHOULDER ARTHROPLASTY;  Surgeon: Christena Flake, MD;  Location: [facility] OR[facility];  Service: Orthopedics;  Laterality: Right;  . REVERSE SHOULDER ARTHROPLASTY Left 02/23/2021   Procedure: REVERSE SHOULDER ARTHROPLASTY;  Surgeon: Christena Flake, MD;  Location: [facility] ORS;  Service: Orthopedics;  Laterality: Left;  . TUBAL LIGATION    . VEIN LIGATION AND STRIPPING      SOCIAL HISTORY: Social History   Socioeconomic History  . Marital status: Single    Spouse name: Not on file  . Number of children: Not on file  . Years of education: Not on file  . Highest education level: Not on file  Occupational History  . Not on file  Tobacco Use  . Smoking status: Former    Current packs/day: 0.00    Average packs/day: 3.0 packs/day for 30.0 years (90.0 ttl pk-yrs)    Types: Cigarettes    Start date: 04/28/1970    Quit date: 04/28/2000    Years since quitting: 23.4  . Smokeless tobacco: Never  Vaping Use  . Vaping status: Never Used  Substance and Sexual Activity  . Alcohol use: Not Currently  . Drug use: No  . Sexual activity: Not on file  Other Topics Concern  . Not on file  Social History Narrative   Lives in [place]; by self; walks with walker/gait instability; quit smoking 20 years ago; no alcohol.    Social Drivers of Corporate investment banker Strain: Low Risk  (08/26/2023)   Received from [facility]   Overall Financial Resource Strain (CARDIA)   . Difficulty of Paying Living Expenses: Not hard at all  Food Insecurity: No Food Insecurity (08/26/2023)   Received from [facility]   Hunger Vital Sign   . Worried About Programme researcher, broadcasting/film/video in the Last Year: Never true   . Ran Out of Food in the Last Year: Never true   Transportation Needs: No Transportation Needs (08/26/2023)   Received from [facility]   [facility] - Transportation   . In the past 12 months, has lack of transportation kept you from medical appointments or from getting medications?: No   . Lack of Transportation (Non-Medical): No  Physical Activity: Not on file  Stress: Not on file  Social Connections: Not on file  Intimate Partner Violence: Not on file    FAMILY HISTORY: Family History  Problem Relation Age of Onset  . Stroke Mother   . Heart attack Mother   . Breast cancer Sister 71  . Stroke Sister   . Heart attack Sister   . Breast cancer Cousin        maternal side 60's    ALLERGIES:  is allergic to codeine, furosemide, penicillin v potassium, quinine, tramadol, and tylenol [acetaminophen].  MEDICATIONS:  Current Outpatient Medications  Medication Sig Dispense Refill  . clotrimazole-betamethasone (LOTRISONE) cream Apply 1 application topically 2 (two) times daily as needed (itching).     Marland Kitchen erythromycin ophthalmic ointment SMARTSIG:In Eye(s)    . ferrous sulfate 325 (65 FE) MG tablet  Take 1 tablet (325 mg total) by mouth 2 (two) times daily with a meal. (Patient taking differently: Take 325 mg by mouth daily with breakfast.) 60 tablet 1  . fluticasone-salmeterol (ADVAIR HFA) 115-21 MCG/ACT inhaler Inhale 2 puffs into the lungs 2 (two) times daily.    . hydrocortisone 2.5 % cream Apply 1 application topically 2 (two) times daily. (Apply to face as spot treatment)    . ketoconazole (NIZORAL) 2 % cream Apply 1 application  topically 2 (two) times daily. (Apply to areas on face)    . losartan (COZAAR) 50 MG tablet Take 50 mg by mouth every morning.     . mometasone-formoterol (DULERA) 100-5 MCG/ACT AERO Inhale 2 puffs into the lungs 2 (two) times daily as needed for wheezing or shortness of breath.    . oxyCODONE (OXY IR/ROXICODONE) 5 MG immediate release tablet Take 1-2 tablets (5-10 mg total) by mouth every 4  (four) hours as needed for moderate pain. (Patient taking differently: Take 5 mg by mouth 2 (two) times daily as needed for moderate pain (pain score 4-6) or severe pain (pain score 7-10).) 60 tablet 0  . pantoprazole (PROTONIX) 40 MG tablet Take 1 tablet (40 mg total) by mouth daily. 30 tablet 1   No current facility-administered medications for this visit.    PHYSICAL EXAMINATION:  # positive heart murmur.   Vitals:   09/24/23 1359  BP: 116/63  Pulse: 71  Resp: 18  Temp: 97.6 F (36.4 C)  SpO2: 100%    Filed Weights   09/24/23 1359  Weight: 174 lb (78.9 kg)     Physical Exam Constitutional:      Comments: Obese.  In a wheelchair.  HENT:     Head: Normocephalic and atraumatic.     Mouth/Throat:     Pharynx: No oropharyngeal exudate.  Eyes:     Pupils: Pupils are equal, round, and reactive to light.  Cardiovascular:     Rate and Rhythm: Normal rate and regular rhythm.     Heart sounds: Murmur heard.  Pulmonary:     Effort: Pulmonary effort is normal. No respiratory distress.     Breath sounds: Normal breath sounds. No wheezing.  Abdominal:     General: Bowel sounds are normal. There is no distension.     Palpations: Abdomen is soft. There is no mass.     Tenderness: There is no abdominal tenderness. There is no guarding or rebound.  Musculoskeletal:        General: No tenderness. Normal range of motion.     Cervical back: Normal range of motion and neck supple.  Skin:    General: Skin is warm.  Neurological:     Mental Status: She is alert and oriented to person, place, and time.  Psychiatric:        Mood and Affect: Affect normal.    LABORATORY DATA:  I have reviewed the data as listed Lab Results  Component Value Date   WBC 7.5 09/24/2023   HGB 8.4 (L) 09/24/2023   HCT 28.1 (L) 09/24/2023   MCV 93.0 09/24/2023   PLT 340 09/24/2023   Recent Labs    03/25/23 1424 06/25/23 1404 07/23/23 1400  NA 139 135 139  K 4.3 4.2 4.1  CL 108 106 109  CO2  23 23 22   GLUCOSE 65* 119* 64*  BUN 22 27* 17  CREATININE 1.28* 1.14* 1.16*  CALCIUM 8.7* 8.7* 8.8*  GFRNONAA 45* 52* 51*  PROT 6.9 6.6 6.9  ALBUMIN 4.0 3.8 4.0  AST 23 19 25   ALT 11 10 13   ALKPHOS 76 66 68  BILITOT 0.4 0.3 0.4     No results found.  Normocytic anemia #Anemia secondary to chronic kidney disease-[nephrology office; March 2021]-iron saturation 15% recently noted to have worsening anemia;  JUNE 2023- Hb 9. Iron studies pending-  On PO iron/day [no GI issues]   # patient is symptomatic Hb 8.4- s/p multiple iron infusion- OCT 2024- I sat 7%- On PO iron; recommend venoferX1. Discussed with the patient the possible need for a bone marrow biopsy and aspiration indication and procedure at length.  Given significant discomfort involved-I would recommend under anesthesia/with radiology in the hospital. I discussed the potential complications include-bleeding/trauma and risk of infection; which are fortunately very rare. Will discuss further at next visit. Await colonoscopy on dec 30th.   # ? Etiology-No obvious GI loss. Liley CKD- monitor for now.  See above.   # CKD- III-GFR 40-50s-diabetes/hypertension-followed by nephrology [Dr.Lateef].  # DISPOSITION: # Venofer # Follow up in 1 months- MD- labs- cbc/cmp/; LDH; haptoglobin; retic count;  b12 ; MM panel; K/L lamda light chains-- Possible venofer--Dr.B  All questions were answered. The patient knows to call the clinic with any problems, questions or concerns.    Earna Coder, MD 09/24/2023 2:24 PM

## 2023-09-24 NOTE — Assessment & Plan Note (Addendum)
#  Anemia secondary to chronic kidney disease-[nephrology office; March 2021]-iron saturation 15% recently noted to have worsening anemia;  JUNE 2023- Hb 9. Iron studies pending-  On PO iron/day [no GI issues]   # patient is symptomatic Hb 8.4- s/p multiple iron infusion- OCT 2024- I sat 7%- On PO iron; recommend venoferX1. Discussed with the patient the possible need for a bone marrow biopsy and aspiration indication and procedure at length.  Given significant discomfort involved-I would recommend under anesthesia/with radiology in the hospital. I discussed the potential complications include-bleeding/trauma and risk of infection; which are fortunately very rare. Will discuss further at next visit. Await colonoscopy on dec 30th.   # ? Etiology-No obvious GI loss. Liley CKD- monitor for now.  See above.   # CKD- III-GFR 40-50s-diabetes/hypertension-followed by nephrology [Dr.Lateef].  # DISPOSITION: # Venofer # Follow up in 1 months- MD- labs- cbc/cmp/; LDH; haptoglobin; retic count;  b12 ; MM panel; K/L lamda light chains-- Possible venofer--Dr.B

## 2023-09-24 NOTE — Progress Notes (Signed)
Having colonoscopy on 10/07/23. No blood in stool. Does have dyspnea with exertion. Denies any dizziness. In a wheelchair today.Denies pain. Has a good appetite.

## 2023-10-04 ENCOUNTER — Encounter: Payer: Self-pay | Admitting: *Deleted

## 2023-10-22 ENCOUNTER — Telehealth: Payer: Self-pay | Admitting: Internal Medicine

## 2023-10-22 NOTE — Telephone Encounter (Signed)
 Called patient to change appointment to APP for 1/28- she wanted to let you know that she didn't have the colonoscopy yet- she is having it 1/30- Thanks

## 2023-10-27 ENCOUNTER — Emergency Department
Admission: EM | Admit: 2023-10-27 | Discharge: 2023-10-27 | Disposition: A | Discharge status: Home / Self Care | Payer: 59 | Attending: Emergency Medicine | Admitting: Emergency Medicine

## 2023-10-27 ENCOUNTER — Emergency Department: Payer: 59

## 2023-10-27 DIAGNOSIS — K529 Noninfective gastroenteritis and colitis, unspecified: Secondary | ICD-10-CM | POA: Insufficient documentation

## 2023-10-27 DIAGNOSIS — R109 Unspecified abdominal pain: Secondary | ICD-10-CM | POA: Diagnosis present

## 2023-10-27 DIAGNOSIS — E1122 Type 2 diabetes mellitus with diabetic chronic kidney disease: Secondary | ICD-10-CM | POA: Insufficient documentation

## 2023-10-27 DIAGNOSIS — K5730 Diverticulosis of large intestine without perforation or abscess without bleeding: Secondary | ICD-10-CM | POA: Diagnosis not present

## 2023-10-27 DIAGNOSIS — R55 Syncope and collapse: Secondary | ICD-10-CM | POA: Diagnosis not present

## 2023-10-27 DIAGNOSIS — G894 Chronic pain syndrome: Secondary | ICD-10-CM | POA: Diagnosis not present

## 2023-10-27 DIAGNOSIS — I700 Atherosclerosis of aorta: Secondary | ICD-10-CM | POA: Insufficient documentation

## 2023-10-27 DIAGNOSIS — I129 Hypertensive chronic kidney disease with stage 1 through stage 4 chronic kidney disease, or unspecified chronic kidney disease: Secondary | ICD-10-CM | POA: Diagnosis not present

## 2023-10-27 DIAGNOSIS — N189 Chronic kidney disease, unspecified: Secondary | ICD-10-CM | POA: Insufficient documentation

## 2023-10-27 DIAGNOSIS — D72829 Elevated white blood cell count, unspecified: Secondary | ICD-10-CM | POA: Diagnosis not present

## 2023-10-27 DIAGNOSIS — J8410 Pulmonary fibrosis, unspecified: Secondary | ICD-10-CM | POA: Insufficient documentation

## 2023-10-27 DIAGNOSIS — Z20822 Contact with and (suspected) exposure to covid-19: Secondary | ICD-10-CM | POA: Insufficient documentation

## 2023-10-27 LAB — COMPREHENSIVE METABOLIC PANEL
ALT: 11 U/L (ref 0–44)
AST: 24 U/L (ref 15–41)
Albumin: 3.8 g/dL (ref 3.5–5.0)
Alkaline Phosphatase: 76 U/L (ref 38–126)
Anion gap: 11 (ref 5–15)
BUN: 22 mg/dL (ref 8–23)
CO2: 22 mmol/L (ref 22–32)
Calcium: 8.3 mg/dL — ABNORMAL LOW (ref 8.9–10.3)
Chloride: 104 mmol/L (ref 98–111)
Creatinine, Ser: 1.23 mg/dL — ABNORMAL HIGH (ref 0.44–1.00)
GFR, Estimated: 47 mL/min — ABNORMAL LOW (ref >60)
Glucose, Bld: 140 mg/dL — ABNORMAL HIGH (ref 70–99)
Potassium: 4.1 mmol/L (ref 3.5–5.1)
Sodium: 137 mmol/L (ref 135–145)
Total Bilirubin: 0.5 mg/dL (ref 0.0–1.2)
Total Protein: 6.7 g/dL (ref 6.5–8.1)

## 2023-10-27 LAB — RESP PANEL BY RT-PCR (RSV, FLU A&B, COVID)  RVPGX2
Influenza A by PCR: NEGATIVE
Influenza B by PCR: NEGATIVE
Resp Syncytial Virus by PCR: NEGATIVE
SARS Coronavirus 2 by RT PCR: NEGATIVE

## 2023-10-27 LAB — CBC WITH DIFFERENTIAL/PLATELET
Abs Immature Granulocytes: 0.06 10*3/uL (ref 0.00–0.07)
Basophils Absolute: 0 10*3/uL (ref 0.0–0.1)
Basophils Relative: 0 %
Eosinophils Absolute: 0.1 10*3/uL (ref 0.0–0.5)
Eosinophils Relative: 1 %
HCT: 28.2 % — ABNORMAL LOW (ref 36.0–46.0)
Hemoglobin: 8.7 g/dL — ABNORMAL LOW (ref 12.0–15.0)
Immature Granulocytes: 1 %
Lymphocytes Relative: 3 %
Lymphs Abs: 0.4 10*3/uL — ABNORMAL LOW (ref 0.7–4.0)
MCH: 27.7 pg (ref 26.0–34.0)
MCHC: 30.9 g/dL (ref 30.0–36.0)
MCV: 89.8 fL (ref 80.0–100.0)
Monocytes Absolute: 0.4 10*3/uL (ref 0.1–1.0)
Monocytes Relative: 3 %
Neutro Abs: 12.1 10*3/uL — ABNORMAL HIGH (ref 1.7–7.7)
Neutrophils Relative %: 92 %
Platelets: 348 10*3/uL (ref 150–400)
RBC: 3.14 MIL/uL — ABNORMAL LOW (ref 3.87–5.11)
RDW: 15.9 % — ABNORMAL HIGH (ref 11.5–15.5)
WBC: 13 10*3/uL — ABNORMAL HIGH (ref 4.0–10.5)
nRBC: 0 % (ref 0.0–0.2)

## 2023-10-27 LAB — LIPASE, BLOOD: Lipase: 35 U/L (ref 11–51)

## 2023-10-27 MED ORDER — ONDANSETRON 4 MG PO TBDP: 4.0000 mg | ORAL_TABLET | Freq: Three times a day (TID) | ORAL | 0 refills | Status: AC | PRN

## 2023-10-27 MED ORDER — ONDANSETRON HCL 4 MG/2ML IJ SOLN
4.0000 mg | Freq: Once | INTRAMUSCULAR | Status: AC
  Administered 2023-10-27: 4 mg via INTRAVENOUS
  Filled 2023-10-27: qty 2

## 2023-10-27 MED ORDER — IOHEXOL 300 MG/ML  SOLN
100.0000 mL | Freq: Once | INTRAMUSCULAR | Status: AC | PRN
  Administered 2023-10-27: 100 mL via INTRAVENOUS

## 2023-10-27 MED ORDER — LACTATED RINGERS IV BOLUS: 1000.0000 mL | Freq: Once | INTRAVENOUS | Status: DC

## 2023-10-27 NOTE — ED Provider Notes (Signed)
[facility] Provider Note    Event Date/Time   First MD Initiated Contact with Patient 10/27/23 0309     (approximate)   History   Chief Complaint Near Syncope and Abdominal Pain   HPI  [name] CASADOS is a 73 y.o. female with past medical history of hypertension, diabetes, CKD, pulmonary fibrosis, and chronic pain syndrome who presents to the ED complaining of abdominal pain.  Patient reports since 10 PM yesterday evening she has had persistent nausea, vomiting, and diarrhea.  She also reports abdominal pain, especially when vomiting.  She denies any fevers, cough, chest pain, or shortness of breath.  She was attempting to go to the bathroom to vomit just prior to arrival when she began to feel dizzy and lightheaded.  She states that she fell to the ground but never fully lost consciousness and did not hit her head.  She denies any pain related to the fall.  She has not had any dysuria, hematuria, or flank pain.     Physical Exam   Triage Vital Signs: ED Triage Vitals  Encounter Vitals Group     BP      Systolic BP Percentile      Diastolic BP Percentile      Pulse      Resp      Temp      Temp src      SpO2      Weight      Height      Head Circumference      Peak Flow      Pain Score      Pain Loc      Pain Education      Exclude from Growth Chart     Most recent vital signs: Vitals:   10/27/23 0332 10/27/23 0515  BP: 128/63   Pulse:  92  Resp: (!) 22 (!) 24  SpO2:  100%    Constitutional: Alert and oriented. Eyes: Conjunctivae are normal. Head: Atraumatic. Nose: No congestion/rhinnorhea. Mouth/Throat: Mucous membranes are moist.  Cardiovascular: Normal rate, regular rhythm. Grossly normal heart sounds.  2+ radial pulses bilaterally. Respiratory: Normal respiratory effort.  No retractions. Lungs CTAB. Gastrointestinal: Soft and diffusely tender to palpation with no rebound or guarding. No distention. Musculoskeletal: No lower  extremity tenderness nor edema.  Neurologic:  Normal speech and language. No gross focal neurologic deficits are appreciated.    ED Results / Procedures / Treatments   Labs (all labs ordered are listed, but only abnormal results are displayed) Labs Reviewed  CBC WITH DIFFERENTIAL/PLATELET - Abnormal; Notable for the following components:      Result Value   WBC 13.0 (*)    RBC 3.14 (*)    Hemoglobin 8.7 (*)    HCT 28.2 (*)    RDW 15.9 (*)    Neutro Abs 12.1 (*)    Lymphs Abs 0.4 (*)    All other components within normal limits  COMPREHENSIVE METABOLIC PANEL - Abnormal; Notable for the following components:   Glucose, Bld 140 (*)    Creatinine, Ser 1.23 (*)    Calcium 8.3 (*)    GFR, Estimated 47 (*)    All other components within normal limits  RESP PANEL BY RT-PCR (RSV, FLU A&B, COVID)  RVPGX2  LIPASE, BLOOD  URINALYSIS, ROUTINE W REFLEX MICROSCOPIC     EKG  ED ECG REPORT I, Chesley Noon, the attending physician, personally viewed and interpreted this ECG.   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  PROCEDURES:  Critical Care performed: No  Procedures   MEDICATIONS ORDERED IN ED: Medications  lactated ringers bolus 1,000 mL (1,000 mLs Intravenous Bolus 10/27/23 0442)  ondansetron (ZOFRAN) injection 4 mg (4 mg Intravenous Given 10/27/23 0443)  iohexol (OMNIPAQUE) 300 MG/ML solution 100 mL (100 mLs Intravenous Contrast Given 10/27/23 0456)     IMPRESSION / MDM / ASSESSMENT AND PLAN / ED COURSE  I reviewed the triage vital signs and the nursing notes.                              73 y.o. female with past medical history of hypertension, diabetes, CKD, pulmonary fibrosis, and  chronic pain syndrome who presents to the ED complaining of persistent nausea, vomiting, diarrhea, and abdominal pain starting last night with a near syncopal episode.  Patient's presentation is most consistent with acute presentation with potential threat to life or bodily function.  Differential diagnosis includes, but is not limited to, gastroenteritis, dehydration, electrolyte abnormality, AKI, UTI, arrhythmia.  Patient nontoxic-appearing and in no acute distress, vital signs are unremarkable.  Symptoms seem most consistent with a gastroenteritis, however she has significant tenderness on exam and we will check CT of her abdomen/pelvis.  Labs are pending at this time, suspect near syncopal episode due to dehydration from significant vomiting and diarrhea.  We will give IV fluid bolus and treat symptomatically with IV Zofran, patient declines pain medication.  CT abdomen/pelvis negative for acute process, does show chronic findings associated with the pancreas that patient is aware of.  She was informed of need to have follow-up MRI in approximately 2 years.  She does state that overall she is feeling much better, now tolerating oral intake without difficulty.  Labs with mild leukocytosis, stable chronic anemia, and stable CKD without acute electrolyte abnormality.  LFTs and lipase are unremarkable, symptoms seem consistent with a gastroenteritis.  Patient appropriate for outpatient management with PCP follow-up, was counseled to return to the ED for new or worsening symptoms.  Patient agrees with plan.      FINAL CLINICAL IMPRESSION(S) / ED DIAGNOSES   Final diagnoses:  Gastroenteritis  Near syncope     Rx / DC Orders   ED Discharge Orders          Ordered    ondansetron (ZOFRAN-ODT) 4 MG disintegrating tablet  Every 8 hours PRN        10/27/23 0701             Note:  This document was prepared using Dragon voice recognition software and may include unintentional dictation  errors.   Chesley Noon, MD 10/27/23 [id number]

## 2023-10-27 NOTE — ED Triage Notes (Signed)
Brought in by medic from home. Patient states she has been vomiting since 10 pm last night. States she was walking to the bathroom and fell. Denies LOC. She did not hit head and is not taking blood thinners. Complaining of dizziness and abdominal pain.

## 2023-10-27 NOTE — ED Notes (Signed)
Unable to void at this time.

## 2023-10-27 NOTE — Progress Notes (Signed)
No IV needed at this time per MD at bedside.

## 2023-11-05 ENCOUNTER — Inpatient Hospital Stay (HOSPITAL_BASED_OUTPATIENT_CLINIC_OR_DEPARTMENT_OTHER): Payer: 59 | Admitting: Nurse Practitioner

## 2023-11-05 ENCOUNTER — Ambulatory Visit: Payer: 59 | Admitting: Internal Medicine

## 2023-11-05 ENCOUNTER — Inpatient Hospital Stay: Payer: 59

## 2023-11-05 ENCOUNTER — Encounter: Payer: Self-pay | Admitting: Nurse Practitioner

## 2023-11-05 ENCOUNTER — Inpatient Hospital Stay: Payer: 59 | Attending: Internal Medicine

## 2023-11-05 VITALS — BP 127/50 | HR 73 | Temp 96.4°F | Wt 164.5 lb

## 2023-11-05 VITALS — BP 122/45 | HR 70 | Resp 20

## 2023-11-05 DIAGNOSIS — D631 Anemia in chronic kidney disease: Secondary | ICD-10-CM | POA: Diagnosis present

## 2023-11-05 DIAGNOSIS — D649 Anemia, unspecified: Secondary | ICD-10-CM

## 2023-11-05 DIAGNOSIS — Z79899 Other long term (current) drug therapy: Secondary | ICD-10-CM | POA: Diagnosis not present

## 2023-11-05 DIAGNOSIS — N1831 Chronic kidney disease, stage 3a: Secondary | ICD-10-CM | POA: Insufficient documentation

## 2023-11-05 LAB — CMP (CANCER CENTER ONLY)
ALT: 16 U/L (ref 0–44)
AST: 22 U/L (ref 15–41)
Albumin: 3.8 g/dL (ref 3.5–5.0)
Alkaline Phosphatase: 67 U/L (ref 38–126)
Anion gap: 9 (ref 5–15)
BUN: 13 mg/dL (ref 8–23)
CO2: 23 mmol/L (ref 22–32)
Calcium: 8.6 mg/dL — ABNORMAL LOW (ref 8.9–10.3)
Chloride: 108 mmol/L (ref 98–111)
Creatinine: 1.28 mg/dL — ABNORMAL HIGH (ref 0.44–1.00)
GFR, Estimated: 45 mL/min — ABNORMAL LOW (ref >60)
Glucose, Bld: 93 mg/dL (ref 70–99)
Potassium: 4 mmol/L (ref 3.5–5.1)
Sodium: 140 mmol/L (ref 135–145)
Total Bilirubin: 0.5 mg/dL (ref 0.0–1.2)
Total Protein: 6.8 g/dL (ref 6.5–8.1)

## 2023-11-05 LAB — RETIC PANEL
Immature Retic Fract: 34.3 % — ABNORMAL HIGH (ref 2.3–15.9)
RBC.: 3.21 MIL/uL — ABNORMAL LOW (ref 3.87–5.11)
Retic Count, Absolute: 122 10*3/uL (ref 19.0–186.0)
Retic Ct Pct: 3.8 % — ABNORMAL HIGH (ref 0.4–3.1)
Reticulocyte Hemoglobin: 25 pg — ABNORMAL LOW (ref >27.9)

## 2023-11-05 LAB — CBC WITH DIFFERENTIAL (CANCER CENTER ONLY)
Abs Immature Granulocytes: 0.04 10*3/uL (ref 0.00–0.07)
Basophils Absolute: 0.1 10*3/uL (ref 0.0–0.1)
Basophils Relative: 1 %
Eosinophils Absolute: 0.3 10*3/uL (ref 0.0–0.5)
Eosinophils Relative: 4 %
HCT: 28.7 % — ABNORMAL LOW (ref 36.0–46.0)
Hemoglobin: 8.5 g/dL — ABNORMAL LOW (ref 12.0–15.0)
Immature Granulocytes: 1 %
Lymphocytes Relative: 16 %
Lymphs Abs: 1.2 10*3/uL (ref 0.7–4.0)
MCH: 26.7 pg (ref 26.0–34.0)
MCHC: 29.6 g/dL — ABNORMAL LOW (ref 30.0–36.0)
MCV: 90.3 fL (ref 80.0–100.0)
Monocytes Absolute: 0.4 10*3/uL (ref 0.1–1.0)
Monocytes Relative: 5 %
Neutro Abs: 5.3 10*3/uL (ref 1.7–7.7)
Neutrophils Relative %: 73 %
Platelet Count: 375 10*3/uL (ref 150–400)
RBC: 3.18 MIL/uL — ABNORMAL LOW (ref 3.87–5.11)
RDW: 15.9 % — ABNORMAL HIGH (ref 11.5–15.5)
WBC Count: 7.2 10*3/uL (ref 4.0–10.5)
nRBC: 0 % (ref 0.0–0.2)

## 2023-11-05 LAB — LACTATE DEHYDROGENASE: LDH: 178 U/L (ref 98–192)

## 2023-11-05 LAB — VITAMIN B12: Vitamin B-12: 690 pg/mL (ref 180–914)

## 2023-11-05 MED ORDER — DIAZEPAM 5 MG PO TABS: ORAL_TABLET | ORAL | 0 refills | Status: DC

## 2023-11-05 MED ORDER — IRON SUCROSE 20 MG/ML IV SOLN
200.0000 mg | Freq: Once | INTRAVENOUS | Status: AC
Start: 2023-11-05 — End: 2023-11-05
  Administered 2023-11-05: 200 mg via INTRAVENOUS
  Filled 2023-11-05: qty 10

## 2023-11-05 NOTE — Progress Notes (Addendum)
 [facility] CONSULT NOTE  Patient Care Team: Barbette Reichmann, MD as PCP - General (Internal Medicine) Earna Coder, MD as Consulting Physician (Hematology and Oncology)  CHIEF COMPLAINTS/PURPOSE OF CONSULTATION: Anemia   HEMATOLOGY HISTORY  #Chronic kidney disease-stage III/ ANEMIA: EGD [~10 y]/colonoscopy-postponed in april;[March 2021- Hb ~10.3; Iron sat- 15%] ; recommend PO iron; HOLD IV Iron.   # CKD [Dr.Lateef]-GFR 40s.    HISTORY OF PRESENTING ILLNESS:  Patient ambulating in wheel chair. Alone/ transportation  [name] [name] 73 y.o. female patient with iron deficient anemia, ? CKD vs losses, returns to clinic for follow up. She did not undergo colonoscopy d/t concerns of being able to drink prep. Denies blood in stools Continues to have dyspnea with exertion but unchanged. She had dizziness earlier this month along with nausea, vomiting, and diarrhea. Was diagnosed with gastroenteritis in ER and discharged home. Has gradually regained her strength. Appetite is stable but weight is down 10 lbs. Continues to take oral iron tablets. Feels tired. Denies abdominal pain, fevers, chills, night sweats.    Review of Systems  Constitutional:  Positive for malaise/fatigue and weight loss. Negative for chills, diaphoresis and fever.  HENT:  Negative for nosebleeds and sore throat.   Eyes:  Negative for double vision.  Respiratory:  Negative for cough, hemoptysis, sputum production, shortness of breath and wheezing.   Cardiovascular:  Negative for chest pain, palpitations, orthopnea and leg swelling.  Gastrointestinal:  Negative for abdominal pain, blood in stool, constipation, diarrhea, heartburn, melena, nausea and vomiting.  Genitourinary:  Negative for dysuria, frequency and urgency.  Musculoskeletal:  Positive for back pain and joint pain.  Skin:  Negative for itching and rash.  Neurological:  Negative for dizziness, tingling, focal weakness, weakness and  headaches.  Endo/Heme/Allergies:  Does not bruise/bleed easily.  Psychiatric/Behavioral:  Negative for depression. The patient is not nervous/anxious and does not have insomnia.     MEDICAL HISTORY:  Past Medical History:  Diagnosis Date   Anemia    Asthma    Chronic airway obstruction (HCC)    Chronic kidney disease    F/U WITH DR LATEEF   Diabetes mellitus without complication (HCC)    Dyspnea    WITH EXERTION   GERD (gastroesophageal reflux disease)    Heart murmur    ASYMPTOMATIC   History of adenomatous polyp of colon    History of bone density study    Hyperlipidemia    Hypertension    Idiopathic peripheral neuropathy    Morbid obesity (HCC)    40.0-44.9   Osteoporosis    Plantar fascial fibromatosis    Pulmonary fibrosis (HCC)    Sleep apnea    TIA (transient ischemic attack)     SURGICAL HISTORY: Past Surgical History:  Procedure Laterality Date   ABDOMINAL HYSTERECTOMY     BACK SURGERY     BLADDER SUSPENSION     CESAREAN SECTION     COLONOSCOPY     COLONOSCOPY N/A 06/29/2021   Procedure: COLONOSCOPY;  Surgeon: Toledo, Boykin Nearing, MD;  Location: [facility] END[facility];  Service: Gastroenterology;  Laterality: N/A;   COLONOSCOPY WITH PROPOFOL N/A 08/30/2020   Procedure: COLONOSCOPY WITH PROPOFOL;  Surgeon: Regis Bill, MD;  Location: [facility] ENDOSCOPY;  Service: Endoscopy;  Laterality: N/A;   DILATION AND CURETTAGE OF UTERUS     ESOPHAGOGASTRODUODENOSCOPY     ESOPHAGOGASTRODUODENOSCOPY N/A 06/29/2021   Procedure: ESOPHAGOGASTRODUODENOSCOPY (EGD);  Surgeon: Toledo, Boykin Nearing, MD;  Location: [facility] END[facility];  Service: Gastroenterology;  Laterality: N/A;  ESOPHAGOGASTRODUODENOSCOPY (EGD) WITH PROPOFOL N/A 08/30/2020   Procedure: ESOPHAGOGASTRODUODENOSCOPY (EGD) WITH PROPOFOL;  Surgeon: Regis Bill, MD;  Location: [facility];  Service: Endoscopy;  Laterality: N/A;   LUMBAR LAMINECTOMY/DECOMPRESSION MICRODISCECTOMY N/A 10/15/2016   Procedure: L3-L5  Laminectomy ;  Surgeon: Lucy Chris, MD;  Location: [facility] OR[facility];  Service: Neurosurgery;  Laterality: N/A;   LUMBAR WOUND DEBRIDEMENT N/A 11/05/2016   Procedure: LUMBAR WOUND DEBRIDEMENT;  Surgeon: Lucy Chris, MD;  Location: [facility];  Service: Neurosurgery;  Laterality: N/A;   REVERSE SHOULDER ARTHROPLASTY Right 05/05/2020   Procedure: REVERSE SHOULDER ARTHROPLASTY;  Surgeon: Christena Flake, MD;  Location: [facility];  Service: Orthopedics;  Laterality: Right;   REVERSE SHOULDER ARTHROPLASTY Left 02/23/2021   Procedure: REVERSE SHOULDER ARTHROPLASTY;  Surgeon: Christena Flake, MD;  Location: [facility];  Service: Orthopedics;  Laterality: Left;   TUBAL LIGATION     VEIN LIGATION AND STRIPPING      SOCIAL HISTORY: Social History   Socioeconomic History   Marital status: Single    Spouse name: Not on file   Number of children: Not on file   Years of education: Not on file   Highest education level: Not on file  Occupational History   Not on file  Tobacco Use   Smoking status: Former    Current packs/day: 0.00    Average packs/day: 3.0 packs/day for 30.0 years (90.0 ttl pk-yrs)    Types: Cigarettes    Start date: 04/28/1970    Quit date: 04/28/2000    Years since quitting: 23.5   Smokeless tobacco: Never  Vaping Use   Vaping status: Never Used  Substance and Sexual Activity   Alcohol use: Not Currently   Drug use: No   Sexual activity: Not on file  Other Topics Concern   Not on file  Social History Narrative   Lives in [place]; by self; walks with walker/gait instability; quit smoking 20 years ago; no alcohol.    Social Drivers of Corporate investment banker Strain: Low Risk  (08/26/2023)   Received from [facility]   Overall Financial Resource Strain (CARDIA)    Difficulty of Paying Living Expenses: Not hard at all  Food Insecurity: No Food Insecurity (08/26/2023)   Received from [facility]   Hunger Vital Sign    Worried About Running Out  of Food in the Last Year: Never true    Ran Out of Food in the Last Year: Never true  Transportation Needs: No Transportation Needs (08/26/2023)   Received from [facility] - Transportation    In the past 12 months, has lack of transportation kept you from medical appointments or from getting medications?: No    Lack of Transportation (Non-Medical): No  Physical Activity: Not on file  Stress: Not on file  Social Connections: Not on file  Intimate Partner Violence: Not on file    FAMILY HISTORY: Family History  Problem Relation Age of Onset   Stroke Mother    Heart attack Mother    Breast cancer Sister 3   Stroke Sister    Heart attack Sister    Breast cancer Cousin        maternal side 60's    ALLERGIES:  is allergic to codeine, furosemide, penicillin v potassium, quinine, tramadol, and tylenol [acetaminophen].  MEDICATIONS:  Current Outpatient Medications  Medication Sig Dispense Refill   clotrimazole-betamethasone (LOTRISONE) cream Apply 1 application topically 2 (two) times daily as needed (itching).  erythromycin ophthalmic ointment SMARTSIG:In Eye(s)     ferrous sulfate 325 (65 FE) MG tablet Take 1 tablet (325 mg total) by mouth 2 (two) times daily with a meal. (Patient taking differently: Take 325 mg by mouth daily with breakfast.) 60 tablet 1   fluticasone-salmeterol (ADVAIR HFA) 115-21 MCG/ACT inhaler Inhale 2 puffs into the lungs 2 (two) times daily.     hydrocortisone 2.5 % cream Apply 1 application topically 2 (two) times daily. (Apply to face as spot treatment)     ketoconazole (NIZORAL) 2 % cream Apply 1 application  topically 2 (two) times daily. (Apply to areas on face)     losartan (COZAAR) 50 MG tablet Take 50 mg by mouth every morning.      mometasone-formoterol (DULERA) 100-5 MCG/ACT AERO Inhale 2 puffs into the lungs 2 (two) times daily as needed for wheezing or shortness of breath.     oxyCODONE (OXY IR/ROXICODONE) 5 MG  immediate release tablet Take 1-2 tablets (5-10 mg total) by mouth every 4 (four) hours as needed for moderate pain. (Patient taking differently: Take 5 mg by mouth 2 (two) times daily as needed for moderate pain (pain score 4-6) or severe pain (pain score 7-10).) 60 tablet 0   ondansetron (ZOFRAN-ODT) 4 MG disintegrating tablet Take 1 tablet (4 mg total) by mouth every 8 (eight) hours as needed for nausea or vomiting. (Patient not taking: Reported on 11/05/2023) 12 tablet 0   pantoprazole (PROTONIX) 40 MG tablet Take 1 tablet (40 mg total) by mouth daily. 30 tablet 1   No current facility-administered medications for this visit.    PHYSICAL EXAMINATION:  Vitals:   11/05/23 1306  BP: (!) 127/50  Pulse: 73  Temp: (!) 96.4 F (35.8 C)  SpO2: 100%   Filed Weights   11/05/23 1306  Weight: 164 lb 8 oz (74.6 kg)   Physical Exam Constitutional:      Comments: Obese.  In a wheelchair.  HENT:     Head: Normocephalic and atraumatic.     Mouth/Throat:     Pharynx: No oropharyngeal exudate.  Eyes:     Pupils: Pupils are equal, round, and reactive to light.  Cardiovascular:     Rate and Rhythm: Normal rate and regular rhythm.     Heart sounds: Murmur heard.  Pulmonary:     Effort: Pulmonary effort is normal. No respiratory distress.     Breath sounds: Normal breath sounds. No wheezing.  Abdominal:     General: Bowel sounds are normal. There is no distension.     Palpations: Abdomen is soft. There is no mass.     Tenderness: There is no abdominal tenderness. There is no guarding or rebound.  Musculoskeletal:        General: No tenderness. Normal range of motion.     Cervical back: Normal range of motion and neck supple.  Skin:    General: Skin is warm.  Neurological:     Mental Status: She is alert and oriented to person, place, and time.  Psychiatric:        Mood and Affect: Affect normal.     LABORATORY DATA:  I have reviewed the data as listed Lab Results  Component Value  Date   WBC 7.2 11/05/2023   HGB 8.5 (L) 11/05/2023   HCT 28.7 (L) 11/05/2023   MCV 90.3 11/05/2023   PLT 375 11/05/2023   Recent Labs    07/23/23 1400 09/24/23 1340 10/27/23 0327  NA 139 136 137  K  4.1 3.9 4.1  CL 109 106 104  CO2 22 22 22   GLUCOSE 64* 88 140*  BUN 17 18 22   CREATININE 1.16* 1.09* 1.23*  CALCIUM 8.8* 8.5* 8.3*  GFRNONAA 51* 55* 47*  PROT 6.9 6.2* 6.7  ALBUMIN 4.0 3.8 3.8  AST 25 22 24   ALT 13 13 11   ALKPHOS 68 72 76  BILITOT 0.4 0.3 0.5   Iron/TIBC/Ferritin/ %Sat    Component Value Date/Time   IRON 120 09/24/2023 1340   TIBC 378 09/24/2023 1340   FERRITIN 67 09/24/2023 1340   IRONPCTSAT 32 (H) 09/24/2023 1340     CT ABDOMEN PELVIS W CONTRAST Result Date: 10/27/2023 CLINICAL DATA:  73 year old female with history of acute onset of nonlocalized abdominal pain. Vomiting. EXAM: CT ABDOMEN AND PELVIS WITH CONTRAST TECHNIQUE: Multidetector CT imaging of the abdomen and pelvis was performed using the standard protocol following bolus administration of intravenous contrast. RADIATION DOSE REDUCTION: This exam was performed according to the departmental dose-optimization program which includes automated exposure control, adjustment of the mA and/or kV according to patient size and/or use of iterative reconstruction technique. CONTRAST:  OMNIPAQUE IOHEXOL 300 MG/ML  SOLN COMPARISON:  CT of the abdomen and pelvis 02/01/2022. FINDINGS: Lower chest: Small right-sided Bochdalek's hernia incidentally noted. Atherosclerotic calcifications in the descending thoracic aorta. Hepatobiliary: No suspicious cystic or solid hepatic lesions. No intra or extrahepatic biliary ductal dilatation. Gallbladder is normal in appearance. Pancreas: Multiple coarse calcifications are again noted in the head of the pancreas. Scattered small low-attenuation lesions are again noted throughout the pancreas, very similar to the prior study, largest of which is in the anterior aspect of the  pancreatic body (axial image 20 of series 2) measuring 1.6 x 1.0 cm, unchanged. No other larger more suspicious appearing solid appearing pancreatic mass noted. No pancreatic ductal dilatation. No peripancreatic fluid collections or inflammatory changes. Spleen: Unremarkable. Adrenals/Urinary Tract: 1 cm intermediate attenuation (37 HU) lesion in the upper pole of the left kidney, incompletely characterized on today's examination, but very similar to prior examination from 02/01/2022, presumably a small proteinaceous/hemorrhagic cysts (Bosniak class 2), no imaging follow-up recommended. 1.4 cm low-attenuation lesion in the upper pole of the right kidney, compatible with a simple (Bosniak class 1) cyst. No aggressive appearing renal lesions. No hydroureteronephrosis. Urinary bladder is unremarkable in appearance. Bilateral adrenal glands are normal in appearance. Stomach/Bowel: The appearance of the stomach is normal. There is no pathologic dilatation of small bowel or colon. Numerous colonic diverticula are noted, without definite focal surrounding inflammatory changes to clearly indicate an acute diverticulitis at this time. Normal appendix. Vascular/Lymphatic: Extensive atherosclerosis throughout the abdominal and pelvic vasculature, without evidence of aneurysm or dissection. No lymphadenopathy noted in the abdomen or pelvis. Reproductive: Status post hysterectomy. Ovaries are not confidently identified may be surgically absent or atrophic. Other: No significant volume of ascites.  No pneumoperitoneum. Musculoskeletal: There are no aggressive appearing lytic or blastic lesions noted in the visualized portions of the skeleton. IMPRESSION: 1. No acute findings are noted in the abdomen or pelvis to account for the patient's symptoms. 2. Colonic diverticulosis without evidence of acute diverticulitis at this time. 3. Aortic atherosclerosis. 4. Stable findings in the pancreas compatible with chronic pancreatitis.  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 Follow-up nonemergent outpatient abdominal MRI with and without IV gadolinium is recommended in 2 years to ensure continued stability. This recommendation follows ACR consensus guidelines: Management of Incidental Pancreatic Cysts: A White  Paper of the ACR Incidental Findings Committee. J Am Coll Radiol 2017;14:911-923. Aortic Atherosclerosis (ICD10-I70.0). Electronically Signed   By: Trudie Reed M.D.   On: 10/27/2023 05:32    Assessment & Plan;  Anemia- etiology ?- poss CKD (nephrology March 2021) vs Iron Def/blood loss (epistaxis vs Melena vs others). June 2023- hmg 9, iron sat 15%. On PO iron daily. Tolerated well. Received venofer x 3 in June. Hemoglobin had worsened to 7.5. Declined transfusion d/t asymptomatic. Received venofer x 5. Hemoglobin has again dropped to 8.4 despite multiple iron infusions. Last venofer 09/23/24. Dr Donneta Romberg recommended bone marrow biopsy which I again reviewed. Patient in agreement. Reviewed risks including pain, bleeding, infection. Reviewed that it is performed with sedation however, d/t significant fear of needles and pain will also provide her with one dose of valium to take prior to procedure. She understands that she must have a driver for the procedure.  Melena- Last colonoscopy and endoscopy with Dr. Norma Fredrickson 06/2021. Diverticula noted. Large adenoma. 1cm hiatal hernia. Gastritis. No capsule. She's now being followed by Dr. Mia Creek. Saw [employer]. Now resolved. Rescheduled colonoscopy for March 2025. Encouraged her to proceed with procedure.  Epistaxis- platelets are normal. May be related to antihistamines/allergies or others. Recommend she follow up with pcp and if ongoing concerns, consider evaluation with ENT.  CKD- stage IIIa. GFR 40-50s-diabetes/hypertension. Followed by nephrology [Dr.Lateef]. Reviewed etiology of anemia in CKD. Recommend optimizing  iron stores. If hemoglobin remains < 10, could consider starting epo. Bone marrow biopsy to rule out other etiologies.  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 Declined counseling or psychiatry.   Addendum: recommend neogenomic cytogenetic testing on bone marrow biopsy to evaluate for chromosomal abnormalities, hematologic malignancies including MDS in the setting of unexplained worsening anemia.    DISPOSITION: Venofer today 1 week- venofer Bone marrow biopsy- 11/18/23 Follow up with Dr Donneta Romberg with labs (cbc, cmp, ldh, ferritin, iron studies, hold tube), +/- venofer a week later- la   No problem-specific Assessment & Plan notes found for this encounter.  All questions were answered. The patient knows to call the clinic with any problems, questions or concerns.  Alinda Dooms, NP 11/05/2023

## 2023-11-05 NOTE — Progress Notes (Signed)
Pt refused 30 minute post observation.  Pt understands risks.  Pt tolerated treatment without concerns.  VSS.

## 2023-11-05 NOTE — Patient Instructions (Signed)

## 2023-11-06 ENCOUNTER — Telehealth: Payer: Self-pay | Admitting: *Deleted

## 2023-11-06 ENCOUNTER — Telehealth: Payer: Self-pay

## 2023-11-06 LAB — KAPPA/LAMBDA LIGHT CHAINS
Kappa free light chain: 31.7 mg/L — ABNORMAL HIGH (ref 3.3–19.4)
Kappa, lambda light chain ratio: 1.22 (ref 0.26–1.65)
Lambda free light chains: 26 mg/L (ref 5.7–26.3)

## 2023-11-06 LAB — HAPTOGLOBIN: Haptoglobin: 139 mg/dL (ref 37–355)

## 2023-11-06 NOTE — Telephone Encounter (Addendum)
Patient scheduled bmb on Thursday 2/13 at 8:30, patient to arrive at 7:30 am. Eliott Nine will send medication to pharmacy to take prior to procedure. Patient verbalized understanding.

## 2023-11-06 NOTE — Telephone Encounter (Signed)
Patient called to say that she was supposed to get a call today in the morning and the patient says that she is okay with the date of February 10 for the biopsy.  We are waiting for IR to give this a date and hopefully would be February 10.  Amy is going to call and ask if the daughter will be able to take her on that date if it gets confirmed of that date we think is going to be okay.  Amy is trying to call the daughter and will call back the patient wants she gets all the information.

## 2023-11-08 LAB — MULTIPLE MYELOMA PANEL, SERUM
Albumin SerPl Elph-Mcnc: 3.7 g/dL (ref 2.9–4.4)
Albumin/Glob SerPl: 1.5 (ref 0.7–1.7)
Alpha 1: 0.2 g/dL (ref 0.0–0.4)
Alpha2 Glob SerPl Elph-Mcnc: 0.6 g/dL (ref 0.4–1.0)
B-Globulin SerPl Elph-Mcnc: 1 g/dL (ref 0.7–1.3)
Gamma Glob SerPl Elph-Mcnc: 0.8 g/dL (ref 0.4–1.8)
Globulin, Total: 2.6 g/dL (ref 2.2–3.9)
IgA: 232 mg/dL (ref 87–352)
IgG (Immunoglobin G), Serum: 691 mg/dL (ref 586–1602)
IgM (Immunoglobulin M), Srm: 211 mg/dL (ref 26–217)
Total Protein ELP: 6.3 g/dL (ref 6.0–8.5)

## 2023-11-11 ENCOUNTER — Telehealth: Payer: Self-pay | Admitting: *Deleted

## 2023-11-11 NOTE — Telephone Encounter (Signed)
2/4 to anothyer day but please make sure that the she has 3 days notice for her to set up transportation and she wants a call back to make sure the pt. Can makei t

## 2023-11-12 ENCOUNTER — Inpatient Hospital Stay: Payer: 59

## 2023-11-14 ENCOUNTER — Inpatient Hospital Stay: Payer: 59 | Attending: Internal Medicine

## 2023-11-14 VITALS — BP 112/59 | HR 82 | Temp 97.7°F | Resp 18

## 2023-11-14 DIAGNOSIS — D631 Anemia in chronic kidney disease: Secondary | ICD-10-CM | POA: Diagnosis present

## 2023-11-14 DIAGNOSIS — I129 Hypertensive chronic kidney disease with stage 1 through stage 4 chronic kidney disease, or unspecified chronic kidney disease: Secondary | ICD-10-CM | POA: Diagnosis not present

## 2023-11-14 DIAGNOSIS — N1831 Chronic kidney disease, stage 3a: Secondary | ICD-10-CM | POA: Insufficient documentation

## 2023-11-14 DIAGNOSIS — Z87891 Personal history of nicotine dependence: Secondary | ICD-10-CM | POA: Insufficient documentation

## 2023-11-14 MED ORDER — IRON SUCROSE 20 MG/ML IV SOLN
200.0000 mg | Freq: Once | INTRAVENOUS | Status: AC
  Administered 2023-11-14: 200 mg via INTRAVENOUS
  Filled 2023-11-14: qty 10

## 2023-11-14 MED ORDER — SODIUM CHLORIDE 0.9% FLUSH
10.0000 mL | Freq: Once | INTRAVENOUS | Status: DC | PRN
  Filled 2023-11-14: qty 10

## 2023-11-18 ENCOUNTER — Other Ambulatory Visit: Payer: Self-pay | Admitting: Radiology

## 2023-11-18 DIAGNOSIS — D649 Anemia, unspecified: Secondary | ICD-10-CM

## 2023-11-19 ENCOUNTER — Telehealth: Payer: Self-pay | Admitting: *Deleted

## 2023-11-19 ENCOUNTER — Other Ambulatory Visit: Payer: Self-pay | Admitting: Radiology

## 2023-11-19 NOTE — H&P (Signed)
Chief Complaint: Patient was seen in consultation today for worsening normocytic anemia   Procedure: Bone Marrow Biopsy and Aspiration  Referring Physician(s): Allen,Lauren G / Dr. Modena Nunnery   Supervising Physician: Ruel Favors  Patient Status: [facility] - Out-pt  History of Present Illness: [name] [name] is a 73 y.o. female with a history of CKD, DM, GERD, HLD, HTN, TIA, sleep apnea, and iron deficiency anemia, question related to CKD, presenting for bone marrow biopsy d/t worsening anemia. Patient has been following w/ medical oncology for anemia for many years. She has been taking oral iron tablets for the past few years which she tolerates well with minimal side effects. She was scheduled for colonoscopy in December 2024 to rule out other potential etiologies, but procedure was not completed as she was unable to drink the prep. 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 Patient plans to take an additional dose of Valium prior to the procedure to help with anxiety. Referred to IR for bone marrow biopsy.  Patient currently resting in bed watching tv. Admits to being nervous about the procedure, but otherwise has no complaints. States she took a dose of Valium this morning that her oncologist prescribed for pre-procedure anxiety. She feels that it helped a little, but is concerned about pain. Reassured patient that we will do our best to minimize discomfort during the procedure. She is not on any blood thinners and has been NPO since midnight. VSS. Afebrile.     Code Status: Full code  Past Medical History:  Diagnosis Date   Anemia    Asthma    Chronic airway obstruction (HCC)    Chronic kidney disease    F/U WITH DR LATEEF   Diabetes mellitus without complication (HCC)    Dyspnea    WITH EXERTION   GERD (gastroesophageal reflux disease)     Heart murmur    ASYMPTOMATIC   History of adenomatous polyp of colon    History of bone density study    Hyperlipidemia    Hypertension    Idiopathic peripheral neuropathy    Morbid obesity (HCC)    40.0-44.9   Osteoporosis    Plantar fascial fibromatosis    Pulmonary fibrosis (HCC)    Sleep apnea    TIA (transient ischemic attack)     Past Surgical History:  Procedure Laterality Date   ABDOMINAL HYSTERECTOMY     BACK SURGERY     BLADDER SUSPENSION     CESAREAN SECTION     COLONOSCOPY     COLONOSCOPY N/A 06/29/2021   Procedure: COLONOSCOPY;  Surgeon: Toledo, Boykin Nearing, MD;  Location: [facility] ENDOSCOPY;  Service: Gastroenterology;  Laterality: N/A;   COLONOSCOPY WITH PROPOFOL N/A 08/30/2020   Procedure: COLONOSCOPY WITH PROPOFOL;  Surgeon: Regis Bill, MD;  Location: [facility] ENDOSCOPY;  Service: Endoscopy;  Laterality: N/A;   DILATION AND CURETTAGE OF UTERUS     ESOPHAGOGASTRODUODENOSCOPY     ESOPHAGOGASTRODUODENOSCOPY N/A 06/29/2021   Procedure: ESOPHAGOGASTRODUODENOSCOPY (EGD);  Surgeon: Toledo, Boykin Nearing, MD;  Location: [facility] ENDOSCOPY;  Service: Gastroenterology;  Laterality: N/A;   ESOPHAGOGASTRODUODENOSCOPY (EGD) WITH PROPOFOL N/A 08/30/2020   Procedure: ESOPHAGOGASTRODUODENOSCOPY (EGD) WITH PROPOFOL;  Surgeon: Regis Bill, MD;  Location: [facility] ENDOSCOPY;  Service: Endoscopy;  Laterality: N/A;   LUMBAR LAMINECTOMY/DECOMPRESSION MICRODISCECTOMY N/A 10/15/2016   Procedure: L3-L5 Laminectomy ;  Surgeon: Lucy Chris, MD;  Location: [facility] ORS;  Service: Neurosurgery;  Laterality: N/A;   LUMBAR WOUND DEBRIDEMENT N/A 11/05/2016   Procedure: LUMBAR WOUND DEBRIDEMENT;  Surgeon: Lucy Chris, MD;  Location: [facility] ORS;  Service: Neurosurgery;  Laterality: N/A;   REVERSE SHOULDER ARTHROPLASTY Right 05/05/2020   Procedure: REVERSE SHOULDER ARTHROPLASTY;  Surgeon: Christena Flake, MD;  Location: [facility] ORS;  Service: Orthopedics;  Laterality: Right;   REVERSE SHOULDER ARTHROPLASTY Left  02/23/2021   Procedure: REVERSE SHOULDER ARTHROPLASTY;  Surgeon: Christena Flake, MD;  Location: [facility] ORS;  Service: Orthopedics;  Laterality: Left;   TUBAL LIGATION     VEIN LIGATION AND STRIPPING      Allergies: Codeine, Furosemide, Penicillin v potassium, Quinine, Tramadol, and Tylenol [acetaminophen]  Medications: Prior to Admission medications   Medication Sig Start Date End Date Taking? Authorizing Provider  clotrimazole-betamethasone (LOTRISONE) cream Apply 1 application topically 2 (two) times daily as needed (itching).  01/28/20   [provider]  diazepam (VALIUM) 5 MG tablet Take 1 tablet (5 mg) 30-60 minutes prior to bone marrow biopsy. 11/05/23   Alinda Dooms, NP  erythromycin ophthalmic ointment SMARTSIG:In Eye(s) 03/14/23   [provider]  ferrous sulfate 325 (65 FE) MG tablet Take 1 tablet (325 mg total) by mouth 2 (two) times daily with a meal. Patient taking differently: Take 325 mg by mouth daily with breakfast. 06/30/21   Willeen Niece, MD  fluticasone-salmeterol (ADVAIR HFA) 956-21 MCG/ACT inhaler Inhale 2 puffs into the lungs 2 (two) times daily.    [provider]  hydrocortisone 2.5 % cream Apply 1 application topically 2 (two) times daily. (Apply to face as spot treatment)    [provider]  ketoconazole (NIZORAL) 2 % cream Apply 1 application  topically 2 (two) times daily. (Apply to areas on face)    [provider]  losartan (COZAAR) 50 MG tablet Take 50 mg by mouth every morning.     [provider]  mometasone-formoterol (DULERA) 100-5 MCG/ACT AERO Inhale 2 puffs into the lungs 2 (two) times daily as needed for wheezing or shortness of breath.    [provider]  ondansetron (ZOFRAN-ODT) 4 MG disintegrating tablet Take 1 tablet (4 mg total) by mouth every 8 (eight) hours as needed for nausea or vomiting. Patient not taking: Reported on 11/05/2023 10/27/23   Chesley Noon, MD  oxyCODONE (OXY  IR/ROXICODONE) 5 MG immediate release tablet Take 1-2 tablets (5-10 mg total) by mouth every 4 (four) hours as needed for moderate pain. Patient taking differently: Take 5 mg by mouth 2 (two) times daily as needed for moderate pain (pain score 4-6) or severe pain (pain score 7-10). 02/25/21   Anson Oregon, PA-C  pantoprazole (PROTONIX) 40 MG tablet Take 1 tablet (40 mg total) by mouth daily. 06/30/21 09/24/23  Willeen Niece, MD     Family History  Problem Relation Age of Onset   Stroke Mother    Heart attack Mother    Breast cancer Sister 1   Stroke Sister    Heart attack Sister    Breast cancer Cousin        maternal side 19's    Social History   Socioeconomic History   Marital status: Single    Spouse name: Not on file   Number of children: Not on file   Years of education: Not on file   Highest education level: Not on file  Occupational History   Not on file  Tobacco Use   Smoking status: Former    Current packs/day: 0.00  Average packs/day: 3.0 packs/day for 30.0 years (90.0 ttl pk-yrs)    Types: Cigarettes    Start date: 04/28/1970    Quit date: 04/28/2000    Years since quitting: 23.5   Smokeless tobacco: Never  Vaping Use   Vaping status: Never Used  Substance and Sexual Activity   Alcohol use: Not Currently   Drug use: No   Sexual activity: Not on file  Other Topics Concern   Not on file  Social History Narrative   Lives in [place]; by self; walks with walker/gait instability; quit smoking 20 years ago; no alcohol.    Social Drivers of Corporate investment banker Strain: Low Risk  (08/26/2023)   Received from [facility]   Overall Financial Resource Strain (CARDIA)    Difficulty of Paying Living Expenses: Not hard at all  Food Insecurity: No Food Insecurity (08/26/2023)   Received from [facility]   Hunger Vital Sign    Worried About Running Out of Food in the Last Year: Never true    Ran Out of Food in the  Last Year: Never true  Transportation Needs: No Transportation Needs (08/26/2023)   Received from [facility] - Transportation    In the past 12 months, has lack of transportation kept you from medical appointments or from getting medications?: No    Lack of Transportation (Non-Medical): No  Physical Activity: Not on file  Stress: Not on file  Social Connections: Not on file    Review of Systems  Psychiatric/Behavioral:  The patient is nervous/anxious.   Denies any N/V, chest pain, shortness of breath, fevers/chills. All other ROS negative.  Vital Signs: BP 131/60 (BP Location: Left Wrist)   Temp 97.8 F (36.6 C) (Oral)   Resp (!) 21   Ht 4\' 11"  (1.499 m)   Wt 171 lb (77.6 kg)   SpO2 100%   BMI 34.54 kg/m    Physical Exam Vitals reviewed.  Constitutional:      Appearance: Normal appearance.  HENT:     Head: Normocephalic and atraumatic.     Mouth/Throat:     Mouth: Mucous membranes are moist.     Pharynx: Oropharynx is clear.  Cardiovascular:     Rate and Rhythm: Normal rate and regular rhythm.     Heart sounds: Normal heart sounds.  Pulmonary:     Effort: Pulmonary effort is normal.     Breath sounds: Examination of the right-upper field reveals wheezing. Examination of the right-lower field reveals decreased breath sounds. Examination of the left-lower field reveals decreased breath sounds. Decreased breath sounds and wheezing present.  Abdominal:     General: Abdomen is flat.     Palpations: Abdomen is soft.     Tenderness: There is no abdominal tenderness.  Musculoskeletal:        General: Normal range of motion.     Cervical back: Normal range of motion.  Skin:    General: Skin is warm and dry.  Neurological:     General: No focal deficit present.     Mental Status: She is alert and oriented to person, place, and time. Mental status is at baseline.  Psychiatric:        Mood and Affect: Mood normal.        Behavior: Behavior  normal.        Judgment: Judgment normal.     Imaging: CT ABDOMEN PELVIS W CONTRAST Result Date: 10/27/2023 CLINICAL DATA:  73 year old female with history of acute onset of nonlocalized abdominal pain. Vomiting. EXAM: CT ABDOMEN AND PELVIS WITH CONTRAST TECHNIQUE: Multidetector CT imaging of the abdomen and pelvis was performed using the standard protocol following bolus administration of intravenous contrast. RADIATION DOSE REDUCTION: This exam was performed according to the departmental dose-optimization program which includes automated exposure control, adjustment of the mA and/or kV according to patient size and/or use of iterative reconstruction technique. CONTRAST:  OMNIPAQUE IOHEXOL 300 MG/ML  SOLN COMPARISON:  CT of the abdomen and pelvis 02/01/2022. FINDINGS: Lower chest: Small right-sided Bochdalek's hernia incidentally noted. Atherosclerotic calcifications in the descending thoracic aorta. Hepatobiliary: No suspicious cystic or solid hepatic lesions. No intra or extrahepatic biliary ductal dilatation. Gallbladder is normal in appearance. Pancreas: Multiple coarse calcifications are again noted in the head of the pancreas. Scattered small low-attenuation lesions are again noted throughout the pancreas, very similar to the prior study, largest of which is in the anterior aspect of the pancreatic body (axial image 20 of series 2) measuring 1.6 x 1.0 cm, unchanged. No other larger more suspicious appearing solid appearing pancreatic mass noted. No pancreatic ductal dilatation. No peripancreatic fluid collections or inflammatory changes. Spleen: Unremarkable. Adrenals/Urinary Tract: 1 cm intermediate attenuation (37 HU) lesion in the upper pole of the left kidney, incompletely characterized on today's examination, but very similar to prior examination from 02/01/2022, presumably a small proteinaceous/hemorrhagic cysts (Bosniak class 2), no imaging follow-up recommended. 1.4 cm low-attenuation  lesion in the upper pole of the right kidney, compatible with a simple (Bosniak class 1) cyst. No aggressive appearing renal lesions. No hydroureteronephrosis. Urinary bladder is unremarkable in appearance. Bilateral adrenal glands are normal in appearance. Stomach/Bowel: The appearance of the stomach is normal. There is no pathologic dilatation of small bowel or colon. Numerous colonic diverticula are noted, without definite focal surrounding inflammatory changes to clearly indicate an acute diverticulitis at this time. Normal appendix. Vascular/Lymphatic: Extensive atherosclerosis throughout the abdominal and pelvic vasculature, without evidence of aneurysm or dissection. No lymphadenopathy noted in the abdomen or pelvis. Reproductive: Status post hysterectomy. Ovaries are not confidently identified may be surgically absent or atrophic. Other: No significant volume of ascites.  No pneumoperitoneum. Musculoskeletal: There are no aggressive appearing lytic or blastic lesions noted in the visualized portions of the skeleton. IMPRESSION: 1. No acute findings are noted in the abdomen or pelvis to account for the patient's symptoms. 2. Colonic diverticulosis without evidence of acute diverticulitis at this time. 3. Aortic atherosclerosis. 4. Stable findings in the pancreas compatible with chronic pancreatitis. 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 Follow-up nonemergent outpatient abdominal MRI with and without IV gadolinium is recommended in 2 years to ensure continued stability. This recommendation follows ACR consensus guidelines: Management of Incidental Pancreatic Cysts: A White Paper of the ACR Incidental Findings Committee. J Am Coll Radiol 2017;14:911-923. Aortic Atherosclerosis (ICD10-I70.0). Electronically Signed   By: Trudie Reed M.D.   On: 10/27/2023 05:32    Labs:  CBC: Recent Labs    09/24/23 1340 10/27/23 0327 11/05/23 1248  11/21/23 0733  WBC 7.5 13.0* 7.2 6.3  HGB 8.4* 8.7* 8.5* 9.8*  HCT 28.1* 28.2* 28.7* 33.0*  PLT 340 348 375 301    COAGS: No results for input(s): "INR", "APTT" in the last 8760 hours.  BMP: Recent Labs    07/23/23 1400 09/24/23 1340 10/27/23 0327 11/05/23 1248  NA 139 136 137 140  K 4.1 3.9 4.1 4.0  CL 109 106  104 108  CO2 22 22 22 23   GLUCOSE 64* 88 140* 93  BUN 17 18 22 13   CALCIUM 8.8* 8.5* 8.3* 8.6*  CREATININE 1.16* 1.09* 1.23* 1.28*  GFRNONAA 51* 55* 47* 45*    LIVER FUNCTION TESTS: Recent Labs    07/23/23 1400 09/24/23 1340 10/27/23 0327 11/05/23 1248  BILITOT 0.4 0.3 0.5 0.5  AST 25 22 24 22   ALT 13 13 11 16   ALKPHOS 68 72 76 67  PROT 6.9 6.2* 6.7 6.8  ALBUMIN 4.0 3.8 3.8 3.8    TUMOR MARKERS: No results for input(s): "AFPTM", "CEA", "CA199", "CHROMGRNA" in the last 8760 hours.  Assessment and Plan:  73 y.o. female with a history of CKD, DM, GERD, HLD, HTN, TIA, sleep apnea, and iron deficiency anemia, question related to CKD, presenting for bone marrow biopsy d/t worsening anemia. Patient has been following w/ medical oncology for anemia for many years. She has been taking oral iron tablets for the past few years which she tolerates well with minimal side effects. She was scheduled for colonoscopy in December 2024 to rule out other potential etiologies, but procedure was not completed as she was unable to drink the prep. 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 Patient plans to take an additional dose of Valium prior to the procedure to help with anxiety. Referred to IR for bone marrow biopsy.  Plan for bone marrow biopsy on 11/21/23 with Dr. Miles Costain   Risks and benefits of bone marrow biopsy was discussed with the patient and/or patient's family including, but not limited to bleeding, infection, damage to adjacent  structures or low yield requiring additional tests.  All of the questions were answered and there is agreement to proceed.  Consent signed and in chart.   Thank you for this interesting consult. I greatly enjoyed meeting [name] [name] and look forward to participating in their care. A copy of this report was sent to the requesting provider on this date.  Electronically Signed: Jama Flavors, PA-C 11/21/2023, 8:21 AM   I spent a total of 30 Minutes in face to face clinical consultation, greater than 50% of which was counseling/coordinating care for bone marrow biopsy.

## 2023-11-19 NOTE — Telephone Encounter (Signed)
Patien wanted to ask again about what time she has to be at heart and vascular on 2/13 and it is 7:30. She has b/p med and she will take the pill around 6:30. She isok with it now,

## 2023-11-20 NOTE — Progress Notes (Signed)
Patient for IR Bone Marrow Biopsy on Thurs 11/21/23, I called and spoke with the patient on the phone and gave pre-procedure instructions. Pt was made aware to be here at 7:30a, NPO after MN prior to procedure as well as driver post procedure/recovery/discharge. Pt stated understanding.  Called 11/20/2023

## 2023-11-21 ENCOUNTER — Telehealth: Payer: Self-pay | Admitting: *Deleted

## 2023-11-21 ENCOUNTER — Other Ambulatory Visit: Payer: Self-pay

## 2023-11-21 ENCOUNTER — Encounter: Payer: Self-pay | Admitting: Radiology

## 2023-11-21 ENCOUNTER — Ambulatory Visit
Admission: RE | Admit: 2023-11-21 | Discharge: 2023-11-21 | Disposition: A | Discharge status: Home / Self Care | Payer: 59 | Source: Ambulatory Visit | Attending: Nurse Practitioner | Admitting: Nurse Practitioner

## 2023-11-21 DIAGNOSIS — D509 Iron deficiency anemia, unspecified: Secondary | ICD-10-CM | POA: Diagnosis present

## 2023-11-21 DIAGNOSIS — G4730 Sleep apnea, unspecified: Secondary | ICD-10-CM | POA: Diagnosis not present

## 2023-11-21 DIAGNOSIS — N189 Chronic kidney disease, unspecified: Secondary | ICD-10-CM | POA: Insufficient documentation

## 2023-11-21 DIAGNOSIS — E785 Hyperlipidemia, unspecified: Secondary | ICD-10-CM | POA: Diagnosis not present

## 2023-11-21 DIAGNOSIS — E1122 Type 2 diabetes mellitus with diabetic chronic kidney disease: Secondary | ICD-10-CM | POA: Diagnosis not present

## 2023-11-21 DIAGNOSIS — Z87891 Personal history of nicotine dependence: Secondary | ICD-10-CM | POA: Diagnosis not present

## 2023-11-21 DIAGNOSIS — K219 Gastro-esophageal reflux disease without esophagitis: Secondary | ICD-10-CM | POA: Insufficient documentation

## 2023-11-21 DIAGNOSIS — I129 Hypertensive chronic kidney disease with stage 1 through stage 4 chronic kidney disease, or unspecified chronic kidney disease: Secondary | ICD-10-CM | POA: Diagnosis not present

## 2023-11-21 DIAGNOSIS — F419 Anxiety disorder, unspecified: Secondary | ICD-10-CM | POA: Insufficient documentation

## 2023-11-21 DIAGNOSIS — D649 Anemia, unspecified: Secondary | ICD-10-CM

## 2023-11-21 DIAGNOSIS — Z8673 Personal history of transient ischemic attack (TIA), and cerebral infarction without residual deficits: Secondary | ICD-10-CM | POA: Insufficient documentation

## 2023-11-21 HISTORY — PX: IR BONE MARROW BIOPSY & ASPIRATION: IMG5727

## 2023-11-21 LAB — CBC WITH DIFFERENTIAL/PLATELET
Abs Immature Granulocytes: 0.02 10*3/uL (ref 0.00–0.07)
Basophils Absolute: 0.1 10*3/uL (ref 0.0–0.1)
Basophils Relative: 1 %
Eosinophils Absolute: 0.3 10*3/uL (ref 0.0–0.5)
Eosinophils Relative: 4 %
HCT: 33 % — ABNORMAL LOW (ref 36.0–46.0)
Hemoglobin: 9.8 g/dL — ABNORMAL LOW (ref 12.0–15.0)
Immature Granulocytes: 0 %
Lymphocytes Relative: 15 %
Lymphs Abs: 1 10*3/uL (ref 0.7–4.0)
MCH: 27.8 pg (ref 26.0–34.0)
MCHC: 29.7 g/dL — ABNORMAL LOW (ref 30.0–36.0)
MCV: 93.5 fL (ref 80.0–100.0)
Monocytes Absolute: 0.4 10*3/uL (ref 0.1–1.0)
Monocytes Relative: 7 %
Neutro Abs: 4.6 10*3/uL (ref 1.7–7.7)
Neutrophils Relative %: 73 %
Platelets: 301 10*3/uL (ref 150–400)
RBC: 3.53 MIL/uL — ABNORMAL LOW (ref 3.87–5.11)
RDW: 17.1 % — ABNORMAL HIGH (ref 11.5–15.5)
WBC: 6.3 10*3/uL (ref 4.0–10.5)
nRBC: 0 % (ref 0.0–0.2)

## 2023-11-21 LAB — GLUCOSE, CAPILLARY: Glucose-Capillary: 92 mg/dL (ref 70–99)

## 2023-11-21 MED ORDER — ONDANSETRON HCL 4 MG/2ML IJ SOLN
INTRAMUSCULAR | Status: AC
  Filled 2023-11-21: qty 2

## 2023-11-21 MED ORDER — LIDOCAINE 1 % OPTIME INJ - NO CHARGE
10.0000 mL | Freq: Once | INTRAMUSCULAR | Status: AC
Start: 2023-11-21 — End: 2023-11-21
  Administered 2023-11-21: 10 mL via INTRADERMAL
  Filled 2023-11-21: qty 10

## 2023-11-21 MED ORDER — FENTANYL CITRATE (PF) 100 MCG/2ML IJ SOLN
INTRAMUSCULAR | Status: AC
  Filled 2023-11-21: qty 2

## 2023-11-21 MED ORDER — ONDANSETRON HCL 4 MG/2ML IJ SOLN
4.0000 mg | Freq: Once | INTRAMUSCULAR | Status: AC
  Administered 2023-11-21: 4 mg via INTRAVENOUS

## 2023-11-21 MED ORDER — MIDAZOLAM HCL 5 MG/5ML IJ SOLN
INTRAMUSCULAR | Status: AC | PRN
  Administered 2023-11-21 (×2): .5 mg via INTRAVENOUS

## 2023-11-21 MED ORDER — FENTANYL CITRATE (PF) 100 MCG/2ML IJ SOLN
INTRAMUSCULAR | Status: AC | PRN
  Administered 2023-11-21 (×2): 25 ug via INTRAVENOUS
  Administered 2023-11-21: 50 ug via INTRAVENOUS

## 2023-11-21 MED ORDER — MIDAZOLAM HCL 2 MG/2ML IJ SOLN
INTRAMUSCULAR | Status: AC
  Filled 2023-11-21: qty 2

## 2023-11-21 MED ORDER — HEPARIN SOD (PORK) LOCK FLUSH 100 UNIT/ML IV SOLN
INTRAVENOUS | Status: AC
  Filled 2023-11-21: qty 5

## 2023-11-21 MED ORDER — SODIUM CHLORIDE 0.9 % IV SOLN: INTRAVENOUS | Status: DC

## 2023-11-21 MED ORDER — MIDAZOLAM HCL 2 MG/2ML IJ SOLN
INTRAMUSCULAR | Status: AC | PRN
  Administered 2023-11-21: 1 mg via INTRAVENOUS

## 2023-11-21 NOTE — Procedures (Signed)
Interventional Radiology Procedure Note  Procedure: CT BM ASP AND CORE BX    Complications: None  Estimated Blood Loss:  MIN  Findings: 11 G CORE AND ASP    M. Ruel Favors, MD

## 2023-11-21 NOTE — Progress Notes (Signed)
Patient clinically stable post IR BMB per Dr Miles Costain, tolerated well. Vitals stable pre and post procedure.  Received Versed 2 mg along with Fentanyl 100 mcg IV for procedure. Denies complaints post procedure. Report given to Moreen Fowler Rn post procedure/specials/15

## 2023-11-22 ENCOUNTER — Ambulatory Visit: Payer: 59 | Admitting: Podiatry

## 2023-11-22 ENCOUNTER — Encounter: Payer: Self-pay | Admitting: Podiatry

## 2023-11-22 DIAGNOSIS — D2372 Other benign neoplasm of skin of left lower limb, including hip: Secondary | ICD-10-CM

## 2023-11-22 DIAGNOSIS — M79674 Pain in right toe(s): Secondary | ICD-10-CM

## 2023-11-22 DIAGNOSIS — M79675 Pain in left toe(s): Secondary | ICD-10-CM

## 2023-11-22 DIAGNOSIS — B351 Tinea unguium: Secondary | ICD-10-CM

## 2023-11-22 NOTE — Progress Notes (Signed)
   Chief Complaint  Patient presents with   Diabetes    "Cut my toenails and scrape the calluses off my foot."    SUBJECTIVE Patient presents to office today complaining of elongated, thickened nails that cause pain while ambulating in shoes.  Patient is unable to trim their own nails.  Patient also has a symptomatic skin lesion to the plantar aspect of the fifth MTP left foot patient is here for further evaluation and treatment.  Past Medical History:  Diagnosis Date   Anemia    Asthma    Chronic airway obstruction (HCC)    Chronic kidney disease    F/U WITH DR LATEEF   Diabetes mellitus without complication (HCC)    Dyspnea    WITH EXERTION   GERD (gastroesophageal reflux disease)    Heart murmur    ASYMPTOMATIC   History of adenomatous polyp of colon    History of bone density study    Hyperlipidemia    Hypertension    Idiopathic peripheral neuropathy    Morbid obesity (HCC)    40.0-44.9   Osteoporosis    Plantar fascial fibromatosis    Pulmonary fibrosis (HCC)    Sleep apnea    TIA (transient ischemic attack)     OBJECTIVE General Patient is awake, alert, and oriented x 3 and in no acute distress. Derm Skin is dry and supple bilateral. Negative open lesions or macerations. Remaining integument unremarkable. Nails are tender, long, thickened and dystrophic with subungual debris, consistent with onychomycosis, 1-5 bilateral. No signs of infection noted.  Hyperkeratotic skin lesions also noted to the fifth MTP left foot with a central nucleated core.  No open wounds Vasc  DP and PT pedal pulses palpable bilaterally. Temperature gradient within normal limits.  Neuro light touch and protective threshold sensation grossly intact bilaterally.  Musculoskeletal Exam No symptomatic pedal deformities noted bilateral. Muscular strength within normal limits.  ASSESSMENT 1.  Pain due to onychomycosis of toenails both 2.  Eccrine poroma fifth MTP left  PLAN OF CARE -Patient  evaluated -Instructed to maintain good pedal hygiene and foot care.  Advised against going barefoot.  Patient admits to going barefoot around the house -Mechanical debridement of nails 1-5 bilaterally performed using a nail nipper. Filed with dremel without incident.  -Excisional debridement of the hyperkeratotic preulcerative callus tissue was also performed today using a 312 scalpel without incident or bleeding.  Salicylic acid with a light dressing applied -Return to clinic 3 months y   Felecia Shelling, DPM [facility]  Dr. Felecia Shelling, DPM    [address] [place], [place] [address]                Office [phone]  Fax [phone]

## 2023-11-25 ENCOUNTER — Inpatient Hospital Stay: Payer: 59

## 2023-11-25 ENCOUNTER — Telehealth: Payer: Self-pay | Admitting: *Deleted

## 2023-11-25 ENCOUNTER — Telehealth: Payer: Self-pay | Admitting: Internal Medicine

## 2023-11-25 ENCOUNTER — Inpatient Hospital Stay: Payer: 59 | Admitting: Internal Medicine

## 2023-11-25 LAB — SURGICAL PATHOLOGY

## 2023-11-25 NOTE — Telephone Encounter (Signed)
Patient called to reschedule appointments due to possible weather- she wanted to be seen today. I let her know she can't be seen today. She ask to leave appointment until she sees what the weather will be like and she will call to r/s if needed on Tuesday.

## 2023-11-25 NOTE — Telephone Encounter (Signed)
She has appt on wed 2/19 and it is going to have bad weather and she wants to get a new appt for next week. I sent message to [place]

## 2023-11-27 ENCOUNTER — Inpatient Hospital Stay: Payer: 59

## 2023-11-27 ENCOUNTER — Ambulatory Visit: Payer: 59

## 2023-11-27 ENCOUNTER — Other Ambulatory Visit: Payer: 59

## 2023-11-27 ENCOUNTER — Inpatient Hospital Stay: Payer: 59 | Admitting: Internal Medicine

## 2023-11-27 ENCOUNTER — Ambulatory Visit: Payer: 59 | Admitting: Internal Medicine

## 2023-11-28 ENCOUNTER — Encounter (HOSPITAL_COMMUNITY): Payer: Self-pay | Admitting: Internal Medicine

## 2023-12-04 ENCOUNTER — Telehealth: Payer: Self-pay | Admitting: *Deleted

## 2023-12-04 NOTE — Progress Notes (Signed)
 Spoke to patient regarding results of the bone marrow biopsy no obvious evidence of malignancy.  Await NGS.  Follow-up as planned next week

## 2023-12-04 NOTE — Telephone Encounter (Signed)
 It looks like the info is done, the pt. Wanted a call about results. The next appt for the pt.3/3 with Lauren NP.

## 2023-12-09 ENCOUNTER — Other Ambulatory Visit: Payer: 59

## 2023-12-09 ENCOUNTER — Ambulatory Visit: Payer: 59 | Admitting: Nurse Practitioner

## 2023-12-09 ENCOUNTER — Ambulatory Visit: Payer: 59

## 2023-12-09 ENCOUNTER — Ambulatory Visit: Payer: 59 | Admitting: Internal Medicine

## 2023-12-11 ENCOUNTER — Other Ambulatory Visit: Payer: Self-pay | Admitting: *Deleted

## 2023-12-11 ENCOUNTER — Other Ambulatory Visit: Payer: Self-pay

## 2023-12-11 ENCOUNTER — Encounter (HOSPITAL_COMMUNITY): Payer: Self-pay | Admitting: Internal Medicine

## 2023-12-11 DIAGNOSIS — D631 Anemia in chronic kidney disease: Secondary | ICD-10-CM

## 2023-12-11 DIAGNOSIS — D509 Iron deficiency anemia, unspecified: Secondary | ICD-10-CM

## 2023-12-12 ENCOUNTER — Inpatient Hospital Stay (HOSPITAL_BASED_OUTPATIENT_CLINIC_OR_DEPARTMENT_OTHER): Payer: 59 | Admitting: Internal Medicine

## 2023-12-12 ENCOUNTER — Inpatient Hospital Stay: Payer: 59

## 2023-12-12 ENCOUNTER — Inpatient Hospital Stay: Payer: 59 | Attending: Internal Medicine

## 2023-12-12 ENCOUNTER — Encounter: Payer: Self-pay | Admitting: Internal Medicine

## 2023-12-12 VITALS — BP 142/40 | HR 63 | Temp 96.7°F | Resp 18 | Ht 59.0 in | Wt 167.0 lb

## 2023-12-12 VITALS — BP 116/52 | HR 61

## 2023-12-12 DIAGNOSIS — N1832 Chronic kidney disease, stage 3b: Secondary | ICD-10-CM

## 2023-12-12 DIAGNOSIS — D631 Anemia in chronic kidney disease: Secondary | ICD-10-CM | POA: Diagnosis present

## 2023-12-12 DIAGNOSIS — D509 Iron deficiency anemia, unspecified: Secondary | ICD-10-CM | POA: Insufficient documentation

## 2023-12-12 DIAGNOSIS — N183 Chronic kidney disease, stage 3 unspecified: Secondary | ICD-10-CM | POA: Diagnosis present

## 2023-12-12 DIAGNOSIS — D649 Anemia, unspecified: Secondary | ICD-10-CM

## 2023-12-12 LAB — CBC WITH DIFFERENTIAL (CANCER CENTER ONLY)
Abs Immature Granulocytes: 0.03 10*3/uL (ref 0.00–0.07)
Basophils Absolute: 0.1 10*3/uL (ref 0.0–0.1)
Basophils Relative: 1 %
Eosinophils Absolute: 0.3 10*3/uL (ref 0.0–0.5)
Eosinophils Relative: 4 %
HCT: 36.4 % (ref 36.0–46.0)
Hemoglobin: 11 g/dL — ABNORMAL LOW (ref 12.0–15.0)
Immature Granulocytes: 0 %
Lymphocytes Relative: 15 %
Lymphs Abs: 1.1 10*3/uL (ref 0.7–4.0)
MCH: 27.5 pg (ref 26.0–34.0)
MCHC: 30.2 g/dL (ref 30.0–36.0)
MCV: 91 fL (ref 80.0–100.0)
Monocytes Absolute: 0.4 10*3/uL (ref 0.1–1.0)
Monocytes Relative: 6 %
Neutro Abs: 5.5 10*3/uL (ref 1.7–7.7)
Neutrophils Relative %: 74 %
Platelet Count: 302 10*3/uL (ref 150–400)
RBC: 4 MIL/uL (ref 3.87–5.11)
RDW: 16 % — ABNORMAL HIGH (ref 11.5–15.5)
WBC Count: 7.4 10*3/uL (ref 4.0–10.5)
nRBC: 0 % (ref 0.0–0.2)

## 2023-12-12 LAB — CMP (CANCER CENTER ONLY)
ALT: 11 U/L (ref 0–44)
AST: 23 U/L (ref 15–41)
Albumin: 4.1 g/dL (ref 3.5–5.0)
Alkaline Phosphatase: 80 U/L (ref 38–126)
Anion gap: 11 (ref 5–15)
BUN: 16 mg/dL (ref 8–23)
CO2: 23 mmol/L (ref 22–32)
Calcium: 9 mg/dL (ref 8.9–10.3)
Chloride: 105 mmol/L (ref 98–111)
Creatinine: 1.17 mg/dL — ABNORMAL HIGH (ref 0.44–1.00)
GFR, Estimated: 50 mL/min — ABNORMAL LOW (ref >60)
Glucose, Bld: 103 mg/dL — ABNORMAL HIGH (ref 70–99)
Potassium: 4 mmol/L (ref 3.5–5.1)
Sodium: 139 mmol/L (ref 135–145)
Total Bilirubin: 0.5 mg/dL (ref 0.0–1.2)
Total Protein: 7.3 g/dL (ref 6.5–8.1)

## 2023-12-12 LAB — LACTATE DEHYDROGENASE: LDH: 165 U/L (ref 98–192)

## 2023-12-12 LAB — FERRITIN: Ferritin: 89 ng/mL (ref 11–307)

## 2023-12-12 LAB — IRON AND TIBC
Iron: 31 ug/dL (ref 28–170)
Saturation Ratios: 8 % — ABNORMAL LOW (ref 10.4–31.8)
TIBC: 375 ug/dL (ref 250–450)
UIBC: 344 ug/dL

## 2023-12-12 LAB — SAMPLE TO BLOOD BANK

## 2023-12-12 MED ORDER — IRON SUCROSE 20 MG/ML IV SOLN
200.0000 mg | Freq: Once | INTRAVENOUS | Status: AC
  Administered 2023-12-12: 200 mg via INTRAVENOUS

## 2023-12-12 MED ORDER — SODIUM CHLORIDE 0.9% FLUSH
10.0000 mL | Freq: Once | INTRAVENOUS | Status: AC | PRN
  Administered 2023-12-12: 10 mL
  Filled 2023-12-12: qty 10

## 2023-12-12 NOTE — Assessment & Plan Note (Addendum)
#  Anemia secondary to chronic kidney disease-[nephrology office; March 2021]-iron saturation 15% recently noted to have worsening anemia;  JUNE 2023- Hb 9. Iron studies pending-  On PO iron/day [no GI issues] - FEB 2025- bone marrow Biopsy:  Mildly hypercellular bone marrow with mild erythroid dyspoiesis, The overall marrow is mildly hypercellular for age.  On the aspirate smear slides there is mild nuclear irregularity and extremely rare budding to the erythroid precursors; however, this is insufficient for diagnosis of dysplasia.  In overall definitive etiology for the patient's anemia is not identified.  The above findings could be related to a stress dyserythropoiesis, but a MDS cannot be completely excluded. Cytogenetics-NEG; NGS- NEG-  haptoglobin; retic count;  b12 ; MM panel; K/L lamda light chains- Negative.   # .Proceed with venofer- continue PO iron. Today Hb 11.    # ? Etiology-No obvious GI loss. Liley CKD- monitor for now.  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  # DISPOSITION: # Venofer # Follow up in 3 months- MD- labs- cbc/cmp/; LDH; -- Possible venofer--Dr.B

## 2023-12-12 NOTE — Progress Notes (Signed)
 Fatigue/weakness: yes Dyspena: yes when cleaning Light headedness: no Blood in stool: no   BMB 11/21/23.

## 2023-12-12 NOTE — Progress Notes (Signed)
 [facility] CONSULT NOTE  Patient Care Team: Barbette Reichmann, MD as PCP - General (Internal Medicine) Earna Coder, MD as Consulting Physician (Hematology and Oncology)  CHIEF COMPLAINTS/PURPOSE OF CONSULTATION: Anemia   HEMATOLOGY HISTORY  #Chronic kidney disease-stage III/ ANEMIA: EGD [~10 y]/colonoscopy-postponed in april;[March 2021- Hb ~10.3; Iron sat- 15%] ; recommend PO iron-  # CKD [Dr.Lateef]-GFR 40s.    HISTORY OF PRESENTING ILLNESS:  Patient ambulating-in wheel chair  Alone/ transportation  [name] [name] 73 y.o.  female patient with iron deficient anemia-question CKD versus others is here for follow-up/and to review the results of bone marrow biopsy.  The bone marrow biopsy procedure was uneventful.   Denies  blood in stool. Does have dyspnea with exertion. Denies any dizziness. In a wheelchair today.Denies pain. Has a good appetite.   Patient patient states that she has been diligently taking iron pills once a day.  Complains of on going fatigue.    No abdominal pain discomfort dyspepsia.  Review of Systems  Constitutional:  Positive for malaise/fatigue. Negative for chills, diaphoresis, fever and weight loss.  HENT:  Negative for nosebleeds and sore throat.   Eyes:  Negative for double vision.  Respiratory:  Negative for cough, hemoptysis, sputum production, shortness of breath and wheezing.   Cardiovascular:  Negative for chest pain, palpitations, orthopnea and leg swelling.  Gastrointestinal:  Negative for abdominal pain, blood in stool, constipation, diarrhea, heartburn, melena, nausea and vomiting.  Genitourinary:  Negative for dysuria, frequency and urgency.  Musculoskeletal:  Positive for back pain and joint pain.  Skin: Negative.  Negative for itching and rash.  Neurological:  Negative for dizziness, tingling, focal weakness, weakness and headaches.  Endo/Heme/Allergies:  Does not bruise/bleed easily.  Psychiatric/Behavioral:   Negative for depression. The patient is not nervous/anxious and does not have insomnia.     MEDICAL HISTORY:  Past Medical History:  Diagnosis Date   Anemia    Asthma    Chronic airway obstruction (HCC)    Chronic kidney disease    F/U WITH DR LATEEF   Diabetes mellitus without complication (HCC)    Dyspnea    WITH EXERTION   GERD (gastroesophageal reflux disease)    Heart murmur    ASYMPTOMATIC   History of adenomatous polyp of colon    History of bone density study    Hyperlipidemia    Hypertension    Idiopathic peripheral neuropathy    Morbid obesity (HCC)    40.0-44.9   Osteoporosis    Plantar fascial fibromatosis    Pulmonary fibrosis (HCC)    Sleep apnea    TIA (transient ischemic attack)     SURGICAL HISTORY: Past Surgical History:  Procedure Laterality Date   ABDOMINAL HYSTERECTOMY     BACK SURGERY     BLADDER SUSPENSION     CESAREAN SECTION     COLONOSCOPY     COLONOSCOPY N/A 06/29/2021   Procedure: COLONOSCOPY;  Surgeon: Toledo, Boykin Nearing, MD;  Location: [facility] ENDOSCOPY;  Service: Gastroenterology;  Laterality: N/A;   COLONOSCOPY WITH PROPOFOL N/A 08/30/2020   Procedure: COLONOSCOPY WITH PROPOFOL;  Surgeon: Regis Bill, MD;  Location: [facility] ENDOSCOPY;  Service: Endoscopy;  Laterality: N/A;   DILATION AND CURETTAGE OF UTERUS     ESOPHAGOGASTRODUODENOSCOPY     ESOPHAGOGASTRODUODENOSCOPY N/A 06/29/2021   Procedure: ESOPHAGOGASTRODUODENOSCOPY (EGD);  Surgeon: Toledo, Boykin Nearing, MD;  Location: [facility] ENDOSCOPY;  Service: Gastroenterology;  Laterality: N/A;   ESOPHAGOGASTRODUODENOSCOPY (EGD) WITH PROPOFOL N/A 08/30/2020   Procedure: ESOPHAGOGASTRODUODENOSCOPY (EGD)  WITH PROPOFOL;  Surgeon: Regis Bill, MD;  Location: [facility] ENDOSCOPY;  Service: Endoscopy;  Laterality: N/A;   IR BONE MARROW BIOPSY & ASPIRATION  11/21/2023   LUMBAR LAMINECTOMY/DECOMPRESSION MICRODISCECTOMY N/A 10/15/2016   Procedure: L3-L5 Laminectomy ;  Surgeon: Lucy Chris, MD;   Location: [facility] ORS;  Service: Neurosurgery;  Laterality: N/A;   LUMBAR WOUND DEBRIDEMENT N/A 11/05/2016   Procedure: LUMBAR WOUND DEBRIDEMENT;  Surgeon: Lucy Chris, MD;  Location: [facility] ORS;  Service: Neurosurgery;  Laterality: N/A;   REVERSE SHOULDER ARTHROPLASTY Right 05/05/2020   Procedure: REVERSE SHOULDER ARTHROPLASTY;  Surgeon: Christena Flake, MD;  Location: [facility] ORS;  Service: Orthopedics;  Laterality: Right;   REVERSE SHOULDER ARTHROPLASTY Left 02/23/2021   Procedure: REVERSE SHOULDER ARTHROPLASTY;  Surgeon: Christena Flake, MD;  Location: [facility] ORS;  Service: Orthopedics;  Laterality: Left;   TUBAL LIGATION     VEIN LIGATION AND STRIPPING      SOCIAL HISTORY: Social History   Socioeconomic History   Marital status: Single    Spouse name: Not on file   Number of children: Not on file   Years of education: Not on file   Highest education level: Not on file  Occupational History   Not on file  Tobacco Use   Smoking status: Former    Current packs/day: 0.00    Average packs/day: 3.0 packs/day for 30.0 years (90.0 ttl pk-yrs)    Types: Cigarettes    Start date: 04/28/1970    Quit date: 04/28/2000    Years since quitting: 23.6   Smokeless tobacco: Never  Vaping Use   Vaping status: Never Used  Substance and Sexual Activity   Alcohol use: Not Currently   Drug use: No   Sexual activity: Not on file  Other Topics Concern   Not on file  Social History Narrative   Lives in [place]; by self; walks with walker/gait instability; quit smoking 20 years ago; no alcohol.    Social Drivers of Corporate investment banker Strain: Low Risk  (08/26/2023)   Received [facility]   Overall Financial Resource Strain (CARDIA)    Difficulty of Paying Living Expenses: Not hard at all  Food Insecurity: No Food Insecurity (08/26/2023)   Received from [facility]   Hunger Vital Sign    Worried About Running Out of Food in the Last Year: Never true     Ran Out of Food in the Last Year: Never true  Transportation Needs: No Transportation Needs (08/26/2023)   Received from [facility] - Transportation    In the past 12 months, has lack of transportation kept you from medical appointments or from getting medications?: No    Lack of Transportation (Non-Medical): No  Physical Activity: Not on file  Stress: Not on file  Social Connections: Not on file  Intimate Partner Violence: Not on file    FAMILY HISTORY: Family History  Problem Relation Age of Onset   Stroke Mother    Heart attack Mother    Breast cancer Sister 2   Stroke Sister    Heart attack Sister    Breast cancer Cousin        maternal side 60's    ALLERGIES:  is allergic to codeine, furosemide, penicillin v potassium, quinine, tramadol, and tylenol [acetaminophen].  MEDICATIONS:  Current Outpatient Medications  Medication Sig Dispense Refill   clotrimazole-betamethasone (LOTRISONE) cream Apply 1 application topically 2 (two) times daily as needed (itching).  diazepam (VALIUM) 5 MG tablet Take 1 tablet (5 mg) 30-60 minutes prior to bone marrow biopsy. 1 tablet 0   erythromycin ophthalmic ointment SMARTSIG:In Eye(s)     ferrous sulfate 325 (65 FE) MG tablet Take 1 tablet (325 mg total) by mouth 2 (two) times daily with a meal. (Patient taking differently: Take 325 mg by mouth daily with breakfast.) 60 tablet 1   fluticasone-salmeterol (ADVAIR HFA) 115-21 MCG/ACT inhaler Inhale 2 puffs into the lungs 2 (two) times daily.     hydrocortisone 2.5 % cream Apply 1 application topically 2 (two) times daily. (Apply to face as spot treatment)     ketoconazole (NIZORAL) 2 % cream Apply 1 application  topically 2 (two) times daily. (Apply to areas on face)     losartan (COZAAR) 50 MG tablet Take 50 mg by mouth every morning.      mometasone-formoterol (DULERA) 100-5 MCG/ACT AERO Inhale 2 puffs into the lungs 2 (two) times daily as needed for wheezing or  shortness of breath.     oxyCODONE (OXY IR/ROXICODONE) 5 MG immediate release tablet Take 1-2 tablets (5-10 mg total) by mouth every 4 (four) hours as needed for moderate pain. (Patient taking differently: Take 5 mg by mouth 2 (two) times daily as needed for moderate pain (pain score 4-6) or severe pain (pain score 7-10).) 60 tablet 0   ondansetron (ZOFRAN-ODT) 4 MG disintegrating tablet Take 1 tablet (4 mg total) by mouth every 8 (eight) hours as needed for nausea or vomiting. (Patient not taking: Reported on 11/05/2023) 12 tablet 0   pantoprazole (PROTONIX) 40 MG tablet Take 1 tablet (40 mg total) by mouth daily. 30 tablet 1   No current facility-administered medications for this visit.    PHYSICAL EXAMINATION:  # positive heart murmur.   Vitals:   12/12/23 0837  BP: (!) 142/40  Pulse: 63  Resp: 18  Temp: (!) 96.7 F (35.9 C)  SpO2: 100%    Filed Weights   12/12/23 0837  Weight: 167 lb (75.8 kg)     Physical Exam Constitutional:      Comments: Obese.  In a wheelchair.  HENT:     Head: Normocephalic and atraumatic.     Mouth/Throat:     Pharynx: No oropharyngeal exudate.  Eyes:     Pupils: Pupils are equal, round, and reactive to light.  Cardiovascular:     Rate and Rhythm: Normal rate and regular rhythm.     Heart sounds: Murmur heard.  Pulmonary:     Effort: Pulmonary effort is normal. No respiratory distress.     Breath sounds: Normal breath sounds. No wheezing.  Abdominal:     General: Bowel sounds are normal. There is no distension.     Palpations: Abdomen is soft. There is no mass.     Tenderness: There is no abdominal tenderness. There is no guarding or rebound.  Musculoskeletal:        General: No tenderness. Normal range of motion.     Cervical back: Normal range of motion and neck supple.  Skin:    General: Skin is warm.  Neurological:     Mental Status: She is alert and oriented to person, place, and time.  Psychiatric:        Mood and Affect: Affect  normal.     LABORATORY DATA:  I have reviewed the data as listed Lab Results  Component Value Date   WBC 7.4 12/12/2023   HGB 11.0 (L) 12/12/2023   HCT 36.4  12/12/2023   MCV 91.0 12/12/2023   PLT 302 12/12/2023   Recent Labs    10/27/23 0327 11/05/23 1248 12/12/23 0810  NA 137 140 139  K 4.1 4.0 4.0  CL 104 108 105  CO2 22 23 23   GLUCOSE 140* 93 103*  BUN 22 13 16   CREATININE 1.23* 1.28* 1.17*  CALCIUM 8.3* 8.6* 9.0  GFRNONAA 47* 45* 50*  PROT 6.7 6.8 7.3  ALBUMIN 3.8 3.8 4.1  AST 24 22 23   ALT 11 16 11   ALKPHOS 76 67 80  BILITOT 0.5 0.5 0.5     IR BONE MARROW BIOPSY & ASPIRATION Result Date: 11/21/2023 INDICATION: Worsening chronic anemia EXAM: CT GUIDED LEFT ILIAC BONE MARROW ASPIRATION AND CORE BIOPSY Date:  11/21/2023 11/21/2023 9:00 am Radiologist:  M. Ruel Favors, MD Guidance:  CT FLUOROSCOPY: Fluoroscopy Time: 0 minutes 48 seconds (17 mGy). MEDICATIONS: 1% LIDOCAINE LOCAL ANESTHESIA/SEDATION: 2.0 mg IV Versed; 100 mcg IV Fentanyl Moderate Sedation Time:  21 minutes The patient was continuously monitored during the procedure by the interventional radiology nurse under my direct supervision. CONTRAST:  None. COMPLICATIONS: None PROCEDURE: Informed consent was obtained from the patient following explanation of the procedure, risks, benefits and alternatives. The patient understands, agrees and consents for the procedure. All questions were addressed. A time out was performed. The patient was positioned prone and non-contrast localization CT was performed of the pelvis to demonstrate the iliac marrow spaces. Maximal barrier sterile technique utilized including caps, mask, sterile gowns, sterile gloves, large sterile drape, hand hygiene, and Betadine prep. Under sterile conditions and local anesthesia, an 11 gauge coaxial bone biopsy needle was advanced into the left iliac marrow space. Needle position was confirmed with CT imaging. Initially, bone marrow aspiration was  performed. Next, the 11 gauge outer cannula was utilized to obtain a left iliac bone marrow core biopsy. Needle was removed. Hemostasis was obtained with compression. The patient tolerated the procedure well. Samples were prepared with the cytotechnologist. No immediate complications. IMPRESSION: CT guided left iliac bone marrow aspiration and core biopsy. Electronically Signed   By: Judie Petit.  Shick M.D.   On: 11/21/2023 09:07    Normocytic anemia #Anemia secondary to chronic kidney disease-[nephrology office; March 2021]-iron saturation 15% recently noted to have worsening anemia;  JUNE 2023- Hb 9. Iron studies pending-  On PO iron/day [no GI issues] - FEB 2025- bone marrow Biopsy:  Mildly hypercellular bone marrow with mild erythroid dyspoiesis, The overall marrow is mildly hypercellular for age.  On the aspirate smear slides there is mild nuclear irregularity and extremely rare budding to the erythroid precursors; however, this is insufficient for diagnosis of dysplasia.  In overall definitive etiology for the patient's anemia is not identified.  The above findings could be related to a stress dyserythropoiesis, but a MDS cannot be completely excluded. Cytogenetics-NEG; NGS- NEG-  haptoglobin; retic count;  b12 ; MM panel; K/L lamda light chains- Negative.   # .Proceed with venofer- continue PO iron. Today Hb 11.    # ? Etiology-No obvious GI loss. Liley CKD- monitor for now.  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  # DISPOSITION: # Venofer # Follow up in 3 months- MD- labs- cbc/cmp/; LDH; -- Possible venofer--Dr.B  All questions were answered. The patient knows to call the clinic with any problems, questions or concerns.    Earna Coder, MD 12/12/2023 9:27 AM

## 2023-12-17 ENCOUNTER — Encounter: Payer: Self-pay | Admitting: *Deleted

## 2023-12-17 ENCOUNTER — Ambulatory Visit: Payer: 59 | Admitting: Podiatry

## 2023-12-30 ENCOUNTER — Ambulatory Visit: Admitting: Anesthesiology

## 2023-12-30 ENCOUNTER — Encounter: Payer: Self-pay | Admitting: *Deleted

## 2023-12-30 ENCOUNTER — Encounter: Payer: Self-pay | Admitting: Internal Medicine

## 2023-12-30 ENCOUNTER — Other Ambulatory Visit: Payer: Self-pay

## 2023-12-30 ENCOUNTER — Ambulatory Visit
Admission: RE | Admit: 2023-12-30 | Discharge: 2023-12-30 | Disposition: A | Discharge status: Home / Self Care | Payer: 59 | Source: Ambulatory Visit | Attending: Gastroenterology | Admitting: Gastroenterology

## 2023-12-30 ENCOUNTER — Encounter
Admission: RE | Disposition: A | Discharge status: Home / Self Care | Payer: Self-pay | Source: Ambulatory Visit | Attending: Gastroenterology

## 2023-12-30 DIAGNOSIS — K219 Gastro-esophageal reflux disease without esophagitis: Secondary | ICD-10-CM | POA: Diagnosis not present

## 2023-12-30 DIAGNOSIS — K5521 Angiodysplasia of colon with hemorrhage: Secondary | ICD-10-CM | POA: Insufficient documentation

## 2023-12-30 DIAGNOSIS — I129 Hypertensive chronic kidney disease with stage 1 through stage 4 chronic kidney disease, or unspecified chronic kidney disease: Secondary | ICD-10-CM | POA: Insufficient documentation

## 2023-12-30 DIAGNOSIS — E1122 Type 2 diabetes mellitus with diabetic chronic kidney disease: Secondary | ICD-10-CM | POA: Insufficient documentation

## 2023-12-30 DIAGNOSIS — J8410 Pulmonary fibrosis, unspecified: Secondary | ICD-10-CM | POA: Insufficient documentation

## 2023-12-30 DIAGNOSIS — K5730 Diverticulosis of large intestine without perforation or abscess without bleeding: Secondary | ICD-10-CM | POA: Diagnosis not present

## 2023-12-30 DIAGNOSIS — D509 Iron deficiency anemia, unspecified: Secondary | ICD-10-CM | POA: Insufficient documentation

## 2023-12-30 DIAGNOSIS — D123 Benign neoplasm of transverse colon: Secondary | ICD-10-CM | POA: Insufficient documentation

## 2023-12-30 DIAGNOSIS — Z8673 Personal history of transient ischemic attack (TIA), and cerebral infarction without residual deficits: Secondary | ICD-10-CM | POA: Diagnosis not present

## 2023-12-30 DIAGNOSIS — K449 Diaphragmatic hernia without obstruction or gangrene: Secondary | ICD-10-CM | POA: Insufficient documentation

## 2023-12-30 DIAGNOSIS — N189 Chronic kidney disease, unspecified: Secondary | ICD-10-CM | POA: Insufficient documentation

## 2023-12-30 DIAGNOSIS — G4733 Obstructive sleep apnea (adult) (pediatric): Secondary | ICD-10-CM | POA: Diagnosis not present

## 2023-12-30 HISTORY — PX: COLONOSCOPY WITH PROPOFOL: SHX5780

## 2023-12-30 HISTORY — PX: ESOPHAGOGASTRODUODENOSCOPY (EGD) WITH PROPOFOL: SHX5813

## 2023-12-30 HISTORY — PX: COLONOSCOPY: SHX5424

## 2023-12-30 HISTORY — PX: POLYPECTOMY: SHX149

## 2023-12-30 SURGERY — COLONOSCOPY WITH PROPOFOL
Anesthesia: General

## 2023-12-30 MED ORDER — EPHEDRINE 5 MG/ML INJ
INTRAVENOUS | Status: AC
  Filled 2023-12-30: qty 5

## 2023-12-30 MED ORDER — PHENYLEPHRINE 80 MCG/ML (10ML) SYRINGE FOR IV PUSH (FOR BLOOD PRESSURE SUPPORT)
PREFILLED_SYRINGE | INTRAVENOUS | Status: DC | PRN
  Administered 2023-12-30: 80 ug via INTRAVENOUS
  Administered 2023-12-30: 50 ug via INTRAVENOUS

## 2023-12-30 MED ORDER — SODIUM CHLORIDE 0.9 % IV SOLN: INTRAVENOUS | Status: DC | PRN

## 2023-12-30 MED ORDER — EPHEDRINE SULFATE-NACL 50-0.9 MG/10ML-% IV SOSY
PREFILLED_SYRINGE | INTRAVENOUS | Status: DC | PRN
  Administered 2023-12-30: 10 mg via INTRAVENOUS

## 2023-12-30 MED ORDER — LIDOCAINE HCL (CARDIAC) PF 100 MG/5ML IV SOSY
PREFILLED_SYRINGE | INTRAVENOUS | Status: DC | PRN
  Administered 2023-12-30: 60 mg via INTRAVENOUS

## 2023-12-30 MED ORDER — PROPOFOL 1000 MG/100ML IV EMUL
INTRAVENOUS | Status: AC
  Filled 2023-12-30: qty 100

## 2023-12-30 MED ORDER — LIDOCAINE HCL (PF) 2 % IJ SOLN
INTRAMUSCULAR | Status: AC
  Filled 2023-12-30: qty 5

## 2023-12-30 MED ORDER — PHENYLEPHRINE 80 MCG/ML (10ML) SYRINGE FOR IV PUSH (FOR BLOOD PRESSURE SUPPORT)
PREFILLED_SYRINGE | INTRAVENOUS | Status: AC
  Filled 2023-12-30: qty 10

## 2023-12-30 MED ORDER — PROPOFOL 10 MG/ML IV BOLUS
INTRAVENOUS | Status: DC | PRN
Start: 2023-12-30 — End: 2023-12-30
  Administered 2023-12-30: 50 mg via INTRAVENOUS

## 2023-12-30 MED ORDER — SODIUM CHLORIDE (PF) 0.9 % IJ SOLN
500.0000 mL | Freq: Once | INTRAMUSCULAR | Status: AC
  Administered 2023-12-30: 500 mL via INTRAVENOUS

## 2023-12-30 MED ORDER — PROPOFOL 500 MG/50ML IV EMUL
INTRAVENOUS | Status: DC | PRN
  Administered 2023-12-30: 120 ug/kg/min via INTRAVENOUS

## 2023-12-30 NOTE — Transfer of Care (Signed)
 Immediate Anesthesia Transfer of Care Note  Patient: [name] [name]  Procedure(s) Performed: COLONOSCOPY WITH PROPOFOL ESOPHAGOGASTRODUODENOSCOPY (EGD) WITH PROPOFOL POLYPECTOMY, INTESTINE COLONOSCOPY, WITH ARGON PLASMA COAGULATION  Patient Location: PACU  Anesthesia Type:General  Level of Consciousness: awake and sedated  Airway & Oxygen Therapy: Patient Spontanous Breathing and Patient connected to nasal cannula oxygen  Post-op Assessment: Report given to RN and Post -op Vital signs reviewed and stable  Post vital signs: Reviewed and stable  Last Vitals:  Vitals Value Taken Time  BP    Temp    Pulse    Resp    SpO2      Last Pain:  Vitals:   12/30/23 0748  TempSrc: Temporal  PainSc: 6          Complications: There were no known notable events for this encounter.

## 2023-12-30 NOTE — Op Note (Signed)
 [facility] Gastroenterology Patient Name: [name] [name] Procedure Date: 12/30/2023 8:11 AM MRN: [id number] Account #: [id number] Date of Birth: [birth year] Admit Type: Outpatient Age: 73 Room: [facility] ENDO ROOM 3 Gender: Female Note Status: Supervisor Override Instrument Name: Nelda Marseille [id number] Procedure:             Colonoscopy Indications:           Iron deficiency anemia, Follow-up for history of                         adenomatous polyps in the colon Providers:             Eather Colas MD, MD Referring MD:          Barbette Reichmann, MD (Referring MD) Medicines:             Monitored Anesthesia Care Complications:         No immediate complications. Estimated blood loss:                         Minimal. Procedure:             Pre-Anesthesia Assessment:                        - Prior to the procedure, a History and Physical was                         performed, and patient medications and allergies were                         reviewed. The patient is competent. The risks and                         benefits of the procedure and the sedation options and                         risks were discussed with the patient. All questions                         were answered and informed consent was obtained.                         Patient identification and proposed procedure were                         verified by the physician, the nurse, the                         anesthesiologist, the anesthetist and the technician                         in the endoscopy suite. Mental Status Examination:                         alert and oriented. Airway Examination: normal                         oropharyngeal airway and neck mobility. Respiratory  Examination: clear to auscultation. CV Examination:                         normal. Prophylactic Antibiotics: The patient does not                         require prophylactic antibiotics. Prior                          Anticoagulants: The patient has taken no anticoagulant                         or antiplatelet agents. ASA Grade Assessment: III - A                         patient with severe systemic disease. After reviewing                         the risks and benefits, the patient was deemed in                         satisfactory condition to undergo the procedure. The                         anesthesia plan was to use monitored anesthesia care                         (MAC). Immediately prior to administration of                         medications, the patient was re-assessed for adequacy                         to receive sedatives. The heart rate, respiratory                         rate, oxygen saturations, blood pressure, adequacy of                         pulmonary ventilation, and response to care were                         monitored throughout the procedure. The physical                         status of the patient was re-assessed after the                         procedure.                        After obtaining informed consent, the colonoscope was                         passed under direct vision. Throughout the procedure,                         the patient's blood pressure, pulse, and oxygen  saturations were monitored continuously. The                         Colonoscope was introduced through the anus and                         advanced to the the cecum, identified by appendiceal                         orifice and ileocecal valve. The colonoscopy was                         performed without difficulty. The patient tolerated                         the procedure well. The quality of the bowel                         preparation was adequate to identify polyps. The                         ileocecal valve, appendiceal orifice, and rectum were                         photographed. Findings:      The perianal and digital rectal examinations were  normal.      A few small localized angioectasias without bleeding were found at the       ileocecal valve. Coagulation for tissue destruction using argon beam was       successful. Estimated blood loss: none.      Many large-mouthed and small-mouthed diverticula were found in the       sigmoid colon, descending colon, transverse colon, hepatic flexure and       ascending colon.      Two sessile polyps were found in the hepatic flexure. The polyps were 2       to 3 mm in size. These polyps were removed with a cold snare. Resection       and retrieval were complete. Estimated blood loss was minimal.      A 3 mm polyp was found in the transverse colon. The polyp was sessile.       The polyp was removed with a cold snare. Resection and retrieval were       complete. Estimated blood loss was minimal.      The exam was otherwise without abnormality on direct and retroflexion       views. Impression:            - A few non-bleeding colonic angioectasias. Treated                         with argon beam coagulation.                        - Diverticulosis in the sigmoid colon, in the                         descending colon, in the transverse colon, at the  hepatic flexure and in the ascending colon.                        - Two 2 to 3 mm polyps at the hepatic flexure, removed                         with a cold snare. Resected and retrieved.                        - One 3 mm polyp in the transverse colon, removed with                         a cold snare. Resected and retrieved.                        - The examination was otherwise normal on direct and                         retroflexion views. Recommendation:        - Discharge patient to home.                        - Resume previous diet.                        - Continue present medications.                        - Await pathology results.                        - Repeat colonoscopy in 3 years for surveillance.                         - Return to referring physician as previously                         scheduled.                        - Return to referring physician as previously                         scheduled. Procedure Code(s):     --- Professional ---                        [phone], Colonoscopy, flexible; with ablation of                         tumor(s), polyp(s), or other lesion(s) (includes pre-                         and post-dilation and guide wire passage, when                         performed)                        [id number], 59, Colonoscopy, flexible; with removal of  tumor(s), polyp(s), or other lesion(s) by snare                         technique Diagnosis Code(s):     --- Professional ---                        K55.20, Angiodysplasia of colon without hemorrhage                        D12.3, Benign neoplasm of transverse colon (hepatic                         flexure or splenic flexure)                        D50.9, Iron deficiency anemia, unspecified                        K57.30, Diverticulosis of large intestine without                         perforation or abscess without bleeding CPT copyright 2022 American Medical Association. All rights reserved. The codes documented in this report are preliminary and upon coder review may  be revised to meet current compliance requirements. Eather Colas MD, MD 12/30/2023 9:08:36 AM Number of Addenda: 0 Note Initiated On: 12/30/2023 8:11 AM Scope Withdrawal Time: 0 hours 15 minutes 44 seconds  Total Procedure Duration: 0 hours 19 minutes 16 seconds  Estimated Blood Loss:  Estimated blood loss was minimal.      [place]

## 2023-12-30 NOTE — Interval H&P Note (Signed)
 History and Physical Interval Note:  12/30/2023 8:14 AM  [name] [name]  has presented today for surgery, with the diagnosis of MELENA, HX OF ADENOMATOUS POLYP OF COLON.  The various methods of treatment have been discussed with the patient and family. After consideration of risks, benefits and other options for treatment, the patient has consented to  Procedure(s) with comments: COLONOSCOPY WITH PROPOFOL (N/A) - DM ESOPHAGOGASTRODUODENOSCOPY (EGD) WITH PROPOFOL (N/A) as a surgical intervention.  The patient's history has been reviewed, patient examined, no change in status, stable for surgery.  I have reviewed the patient's chart and labs.  Questions were answered to the patient's satisfaction.     Regis Bill  Ok to proceed with EGD/Colonoscopy

## 2023-12-30 NOTE — H&P (Signed)
 Outpatient short stay form Pre-procedure 12/30/2023  Regis Bill, MD  Primary Physician: Barbette Reichmann, MD  Reason for visit:  IDA  History of present illness:    73 y/o lady with history of hypertension, CKD, and OSA here for EGD/Colonoscopy for IDA. No blood thinners. No family history of colon cancer. No significant abdominal surgeries.    Current Facility-Administered Medications:    sodium chloride (PF) 0.9 % injection 500 mL, 500 mL, Intravenous, Once, Gianna Calef, Rossie Muskrat, MD  Medications Prior to Admission  Medication Sig Dispense Refill Last Dose/Taking   ferrous sulfate 325 (65 FE) MG tablet Take 1 tablet (325 mg total) by mouth 2 (two) times daily with a meal. (Patient taking differently: Take 325 mg by mouth daily with breakfast.) 60 tablet 1 Past Week   losartan (COZAAR) 50 MG tablet Take 50 mg by mouth every morning.    12/29/2023   pantoprazole (PROTONIX) 40 MG tablet Take 1 tablet (40 mg total) by mouth daily. 30 tablet 1 12/29/2023   clotrimazole-betamethasone (LOTRISONE) cream Apply 1 application topically 2 (two) times daily as needed (itching).       diazepam (VALIUM) 5 MG tablet Take 1 tablet (5 mg) 30-60 minutes prior to bone marrow biopsy. 1 tablet 0    erythromycin ophthalmic ointment SMARTSIG:In Eye(s)      fluticasone-salmeterol (ADVAIR HFA) 115-21 MCG/ACT inhaler Inhale 2 puffs into the lungs 2 (two) times daily.      hydrocortisone 2.5 % cream Apply 1 application topically 2 (two) times daily. (Apply to face as spot treatment)      ketoconazole (NIZORAL) 2 % cream Apply 1 application  topically 2 (two) times daily. (Apply to areas on face)      mometasone-formoterol (DULERA) 100-5 MCG/ACT AERO Inhale 2 puffs into the lungs 2 (two) times daily as needed for wheezing or shortness of breath.      ondansetron (ZOFRAN-ODT) 4 MG disintegrating tablet Take 1 tablet (4 mg total) by mouth every 8 (eight) hours as needed for nausea or vomiting. (Patient not  taking: Reported on 11/05/2023) 12 tablet 0    oxyCODONE (OXY IR/ROXICODONE) 5 MG immediate release tablet Take 1-2 tablets (5-10 mg total) by mouth every 4 (four) hours as needed for moderate pain. (Patient taking differently: Take 5 mg by mouth 2 (two) times daily as needed for moderate pain (pain score 4-6) or severe pain (pain score 7-10).) 60 tablet 0 12/28/2023     Allergies  Allergen Reactions   Codeine Nausea And Vomiting   Furosemide Other (See Comments)   Penicillin V Potassium     Other reaction(s): Unknown   Quinine     Other reaction(s): Unknown   Tramadol     Other reaction(s): Unknown   Tylenol [Acetaminophen]     Tylenol-Codeine: Dizziness     Past Medical History:  Diagnosis Date   Anemia    Asthma    Chronic airway obstruction (HCC)    Chronic kidney disease    F/U WITH DR LATEEF   Diabetes mellitus without complication (HCC)    Dyspnea    WITH EXERTION   GERD (gastroesophageal reflux disease)    Heart murmur    ASYMPTOMATIC   History of adenomatous polyp of colon    History of bone density study    Hyperlipidemia    Hypertension    Idiopathic peripheral neuropathy    Morbid obesity (HCC)    40.0-44.9   Osteoporosis    Plantar fascial fibromatosis    Pulmonary  fibrosis (HCC)    Sleep apnea    TIA (transient ischemic attack)     Review of systems:  Otherwise negative.    Physical Exam  Gen: Alert, oriented. Appears stated age.  HEENT: PERRLA. Lungs: No respiratory distress CV: RRR Abd: soft, benign, no masses Ext: No edema    Planned procedures: Proceed with EGD/colonoscopy. The patient understands the nature of the planned procedure, indications, risks, alternatives and potential complications including but not limited to bleeding, infection, perforation, damage to internal organs and possible oversedation/side effects from anesthesia. The patient agrees and gives consent to proceed.  Please refer to procedure notes for findings,  recommendations and patient disposition/instructions.     Regis Bill, MD [facility]

## 2023-12-30 NOTE — Op Note (Signed)
 [facility] Gastroenter[facility] Patient Name: [name] [name] Procedure Date: 12/30/2023 8:11 AM MRN: [id number] Account #: [id number] Date of Birth: [birth year] Admit Type: Outpatient Age: 73 Room: [facility] ENDO ROOM 3 Gender: Female Note Status: Supervisor Override Instrument Name: Patton Salles Endoscope [id number] Procedure:             Upper GI endoscopy Indications:           Iron deficiency anemia, Melena Providers:             Eather Colas MD, MD Referring MD:          Barbette Reichmann, MD (Referring MD) Medicines:             Monitored Anesthesia Care Complications:         No immediate complications. Procedure:             Pre-Anesthesia Assessment:                        - Prior to the procedure, a History and Physical was                         performed, and patient medications and allergies were                         reviewed. The patient is competent. The risks and                         benefits of the procedure and the sedation options and                         risks were discussed with the patient. All questions                         were answered and informed consent was obtained.                         Patient identification and proposed procedure were                         verified by the physician, the nurse, the                         anesthesiologist, the anesthetist and the technician                         in the endoscopy suite. Mental Status Examination:                         alert and oriented. Airway Examination: normal                         oropharyngeal airway and neck mobility. Respiratory                         Examination: clear to auscultation. CV Examination:                         normal. Prophylactic Antibiotics: The patient does not  require prophylactic antibiotics. Prior                         Anticoagulants: The patient has taken no anticoagulant                         or antiplatelet agents. ASA  Grade Assessment: III - A                         patient with severe systemic disease. After reviewing                         the risks and benefits, the patient was deemed in                         satisfactory condition to undergo the procedure. The                         anesthesia plan was to use monitored anesthesia care                         (MAC). Immediately prior to administration of                         medications, the patient was re-assessed for adequacy                         to receive sedatives. The heart rate, respiratory                         rate, oxygen saturations, blood pressure, adequacy of                         pulmonary ventilation, and response to care were                         monitored throughout the procedure. The physical                         status of the patient was re-assessed after the                         procedure.                        After obtaining informed consent, the endoscope was                         passed under direct vision. Throughout the procedure,                         the patient's blood pressure, pulse, and oxygen                         saturations were monitored continuously. The Endoscope                         was introduced through the mouth, and advanced to the  second part of duodenum. The upper GI endoscopy was                         accomplished without difficulty. The patient tolerated                         the procedure well. Findings:      A small hiatal hernia was present.      The exam of the esophagus was otherwise normal.      The entire examined stomach was normal.      The examined duodenum was normal. Impression:            - Small hiatal hernia.                        - Normal stomach.                        - Normal examined duodenum.                        - No specimens collected. Recommendation:        - Discharge patient to home.                        -  Resume previous diet.                        - Continue present medications.                        - Return to referring physician as previously                         scheduled. Procedure Code(s):     --- Professional ---                        [phone], Esophagogastroduodenoscopy, flexible,                         transoral; diagnostic, including collection of                         specimen(s) by brushing or washing, when performed                         (separate procedure) Diagnosis Code(s):     --- Professional ---                        K44.9, Diaphragmatic hernia without obstruction or                         gangrene                        D50.9, Iron deficiency anemia, unspecified CPT copyright 2022 American Medical Association. All rights reserved. The codes documented in this report are preliminary and upon coder review may  be revised to meet current compliance requirements. Eather Colas MD, MD 12/30/2023 8:58:10 AM Number of Addenda: 0 Note Initiated On: 12/30/2023 8:11 AM Estimated Blood Loss:  Estimated blood loss: none.  [facility]

## 2023-12-30 NOTE — Anesthesia Preprocedure Evaluation (Addendum)
 Anesthesia Evaluation  Patient identified by MRN, date of birth, ID band Patient awake    Reviewed: Allergy & Precautions, H&P , NPO status , Patient's Chart, lab work & pertinent test results  Airway Mallampati: II  TM Distance: >3 FB Neck ROM: full    Dental  (+) Edentulous Upper   Pulmonary sleep apnea , COPD, former smoker Pulmonary fibrosis    Pulmonary exam normal        Cardiovascular Exercise Tolerance: Poor hypertension, + DOE  + Valvular Problems/Murmurs  + Systolic murmurs ECHO 02/2022:INTERPRETATION  NORMAL LEFT VENTRICULAR SYSTOLIC FUNCTION   WITH MILD LVH  NORMAL RIGHT VENTRICULAR SYSTOLIC FUNCTION  MODERATE VALVULAR STENOSIS (See above)  AVA(VTI)= 1.34cm^2  TRIVIAL MR, TR, PR  MODERATE AR  MODERATE AS  EF 55%     Neuro/Psych TIA Neuromuscular disease (PN)  negative psych ROS   GI/Hepatic Neg liver ROS,GERD  ,,  Endo/Other  diabetes    Renal/GU Renal InsufficiencyRenal disease  negative genitourinary   Musculoskeletal   Abdominal  (+) + obese  Peds  Hematology negative hematology ROS (+)   Anesthesia Other Findings Past Medical History: No date: Anemia No date: Asthma No date: Chronic airway obstruction (HCC) No date: Chronic kidney disease     Comment:  F/U WITH DR LATEEF No date: Diabetes mellitus without complication (HCC) No date: Dyspnea     Comment:  WITH EXERTION No date: GERD (gastroesophageal reflux disease) No date: Heart murmur     Comment:  ASYMPTOMATIC No date: History of adenomatous polyp of colon No date: History of bone density study No date: Hyperlipidemia No date: Hypertension No date: Idiopathic peripheral neuropathy No date: Morbid obesity (HCC)     Comment:  40.0-44.9 No date: Osteoporosis 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 No date: Pulmonary fibrosis (HCC) No date: Sleep apnea No date: TIA (transient ischemic attack)  Past Surgical History: No date:  ABDOMINAL HYSTERECTOMY No date: BACK SURGERY No date: BLADDER SUSPENSION No date: CESAREAN SECTION No date: COLONOSCOPY 06/29/2021: COLONOSCOPY; N/A     Comment:  Procedure: COLONOSCOPY;  Surgeon: Toledo, Boykin Nearing, MD;              Location: [facility] ENDOSCOPY;  Service: Gastroenterology;                Laterality: N/A; 08/30/2020: COLONOSCOPY WITH PROPOFOL; N/A     Comment:  Procedure: COLONOSCOPY WITH PROPOFOL;  Surgeon:               Regis Bill, MD;  Location: [facility] ENDOSCOPY;                Service: Endoscopy;  Laterality: N/A; No date: DILATION AND CURETTAGE OF UTERUS No date: ESOPHAGOGASTRODUODENOSCOPY 06/29/2021: ESOPHAGOGASTRODUODENOSCOPY; N/A     Comment:  Procedure: ESOPHAGOGASTRODUODENOSCOPY (EGD);  Surgeon:               Toledo, Boykin Nearing, MD;  Location: [facility] ENDOSCOPY;                Service: Gastroenterology;  Laterality: N/A; 08/30/2020: ESOPHAGOGASTRODUODENOSCOPY (EGD) WITH PROPOFOL; N/A     Comment:  Procedure: ESOPHAGOGASTRODUODENOSCOPY (EGD) WITH               PROPOFOL;  Surgeon: Regis Bill, MD;  Location:               [facility] ENDOSCOPY;  Service: Endoscopy;  Laterality: N/A; 11/21/2023: IR BONE MARROW BIOPSY & ASPIRATION 10/15/2016: LUMBAR LAMINECTOMY/DECOMPRESSION MICRODISCECTOMY; N/A     Comment:  Procedure: L3-L5 Laminectomy ;  Surgeon: Lucy Chris,               MD;  Location: [facility] ORS;  Service: Neurosurgery;                Laterality: N/A; 11/05/2016: LUMBAR WOUND DEBRIDEMENT; N/A     Comment:  Procedure: LUMBAR WOUND DEBRIDEMENT;  Surgeon: Lucy Chris, MD;  Location: [facility] ORS;  Service: Neurosurgery;                Laterality: N/A; 05/05/2020: REVERSE SHOULDER ARTHROPLASTY; Right     Comment:  Procedure: REVERSE SHOULDER ARTHROPLASTY;  Surgeon:               Christena Flake, MD;  Location: [facility] ORS;  Service:               Orthopedics;  Laterality: Right; 02/23/2021: REVERSE SHOULDER ARTHROPLASTY; Left     Comment:  Procedure:  REVERSE SHOULDER ARTHROPLASTY;  Surgeon:               Christena Flake, MD;  Location: [facility] ORS;  Service:               Orthopedics;  Laterality: Left; No date: TUBAL LIGATION No date: VEIN LIGATION AND STRIPPING     Reproductive/Obstetrics negative OB ROS                             Anesthesia Physical Anesthesia Plan  ASA: 3  Anesthesia Plan: General   Post-op Pain Management:    Induction:   PONV Risk Score and Plan: Propofol infusion and TIVA  Airway Management Planned:   Additional Equipment:   Intra-op Plan:   Post-operative Plan:   Informed Consent: I have reviewed the patients History and Physical, chart, labs and discussed the procedure including the risks, benefits and alternatives for the proposed anesthesia with the patient or authorized representative who has indicated his/her understanding and acceptance.     Dental Advisory Given  Plan Discussed with: CRNA and Surgeon  Anesthesia Plan Comments:         Anesthesia Quick Evaluation

## 2023-12-31 LAB — SURGICAL PATHOLOGY

## 2023-12-31 NOTE — Anesthesia Postprocedure Evaluation (Signed)
 Anesthesia Post Note  Patient: [name] [name]  Procedure(s) Performed: COLONOSCOPY WITH PROPOFOL ESOPHAGOGASTRODUODENOSCOPY (EGD) WITH PROPOFOL POLYPECTOMY, INTESTINE COLONOSCOPY, WITH ARGON PLASMA COAGULATION  Patient location during evaluation: PACU Anesthesia Type: General Level of consciousness: awake and alert Pain management: pain level controlled Vital Signs Assessment: post-procedure vital signs reviewed and stable Respiratory status: spontaneous breathing, nonlabored ventilation and respiratory function stable Cardiovascular status: blood pressure returned to baseline and stable Postop Assessment: no apparent nausea or vomiting Anesthetic complications: no   There were no known notable events for this encounter.   Last Vitals:  Vitals:   12/30/23 0906 12/30/23 0916  BP: (!) 90/52 105/74  Pulse: 76   Resp: (!) 25   Temp:    SpO2: 100%     Last Pain:  Vitals:   12/31/23 0735  TempSrc:   PainSc: 0-No pain                 Foye Deer

## 2024-02-21 ENCOUNTER — Encounter: Payer: Self-pay | Admitting: Podiatry

## 2024-02-21 ENCOUNTER — Ambulatory Visit: Payer: 59 | Admitting: Podiatry

## 2024-02-21 DIAGNOSIS — E119 Type 2 diabetes mellitus without complications: Secondary | ICD-10-CM

## 2024-02-21 DIAGNOSIS — B351 Tinea unguium: Secondary | ICD-10-CM | POA: Diagnosis not present

## 2024-02-21 DIAGNOSIS — M79675 Pain in left toe(s): Secondary | ICD-10-CM | POA: Diagnosis not present

## 2024-02-21 DIAGNOSIS — M79674 Pain in right toe(s): Secondary | ICD-10-CM

## 2024-02-21 DIAGNOSIS — D2372 Other benign neoplasm of skin of left lower limb, including hip: Secondary | ICD-10-CM | POA: Diagnosis not present

## 2024-02-21 NOTE — Progress Notes (Signed)
   Chief Complaint  Patient presents with   Diabetes    "Cut my toenail "  Dr. Kevan Peers - 02/21/2024; A1c - 4.9      SUBJECTIVE Patient presents to office today complaining of elongated, thickened nails that cause pain while ambulating in shoes.  Patient is unable to trim their own nails.  Patient also has a symptomatic skin lesion to the plantar aspect of the fifth MTP left foot patient is here for further evaluation and treatment.  Past Medical History:  Diagnosis Date   Anemia    Asthma    Chronic airway obstruction (HCC)    Chronic kidney disease    F/U WITH DR LATEEF   Diabetes mellitus without complication (HCC)    Dyspnea    WITH EXERTION   GERD (gastroesophageal reflux disease)    Heart murmur    ASYMPTOMATIC   History of adenomatous polyp of colon    History of bone density study    Hyperlipidemia    Hypertension    Idiopathic peripheral neuropathy    Morbid obesity (HCC)    40.0-44.9   Osteoporosis    Plantar fascial fibromatosis    Pulmonary fibrosis (HCC)    Sleep apnea    TIA (transient ischemic attack)     OBJECTIVE General Patient is awake, alert, and oriented x 3 and in no acute distress. Derm Skin is dry and supple bilateral. Negative open lesions or macerations. Remaining integument unremarkable. Nails are tender, long, thickened and dystrophic with subungual debris, consistent with onychomycosis, 1-5 bilateral. No signs of infection noted.  Hyperkeratotic skin lesions also noted to the fifth MTP and plantar heel left foot with a central nucleated core.  No open wounds Vasc  DP and PT pedal pulses palpable bilaterally. Temperature gradient within normal limits.  Neuro light touch and protective threshold sensation grossly intact bilaterally.  Musculoskeletal Exam No symptomatic pedal deformities noted bilateral. Muscular strength within normal limits.  ASSESSMENT 1.  Pain due to onychomycosis of toenails both 2.  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  Comprehensive diabetic foot exam performed today -Instructed to maintain good pedal hygiene and foot care.  Advised against going barefoot.  Patient admits to going barefoot around the house -Mechanical debridement of nails 1-5 bilaterally performed using a nail nipper. Filed with dremel without incident.  -Excisional debridement of the hyperkeratotic preulcerative callus tissue was also performed today using a 312 scalpel without incident or bleeding.  Salicylic acid with a light dressing applied -Return to clinic 3 months   Dot Gazella, DPM [facility]  Dr. Dot Gazella, DPM    [address] [place], [place] [address]                Office [phone]  Fax [phone]

## 2024-03-06 ENCOUNTER — Encounter: Payer: Self-pay | Admitting: Internal Medicine

## 2024-03-13 ENCOUNTER — Inpatient Hospital Stay

## 2024-03-13 ENCOUNTER — Inpatient Hospital Stay (HOSPITAL_BASED_OUTPATIENT_CLINIC_OR_DEPARTMENT_OTHER): Admitting: Internal Medicine

## 2024-03-13 ENCOUNTER — Encounter: Payer: Self-pay | Admitting: Internal Medicine

## 2024-03-13 ENCOUNTER — Inpatient Hospital Stay: Attending: Internal Medicine

## 2024-03-13 VITALS — BP 157/66 | HR 75 | Temp 97.9°F | Resp 18 | Ht 59.0 in | Wt 176.0 lb

## 2024-03-13 VITALS — BP 129/56 | HR 67 | Temp 97.2°F | Resp 16

## 2024-03-13 DIAGNOSIS — D509 Iron deficiency anemia, unspecified: Secondary | ICD-10-CM | POA: Insufficient documentation

## 2024-03-13 DIAGNOSIS — D649 Anemia, unspecified: Secondary | ICD-10-CM

## 2024-03-13 DIAGNOSIS — Z79899 Other long term (current) drug therapy: Secondary | ICD-10-CM | POA: Insufficient documentation

## 2024-03-13 DIAGNOSIS — D631 Anemia in chronic kidney disease: Secondary | ICD-10-CM

## 2024-03-13 LAB — CMP (CANCER CENTER ONLY)
ALT: 13 U/L (ref 0–44)
AST: 22 U/L (ref 15–41)
Albumin: 3.7 g/dL (ref 3.5–5.0)
Alkaline Phosphatase: 86 U/L (ref 38–126)
Anion gap: 9 (ref 5–15)
BUN: 15 mg/dL (ref 8–23)
CO2: 22 mmol/L (ref 22–32)
Calcium: 8.5 mg/dL — ABNORMAL LOW (ref 8.9–10.3)
Chloride: 109 mmol/L (ref 98–111)
Creatinine: 1.21 mg/dL — ABNORMAL HIGH (ref 0.44–1.00)
GFR, Estimated: 48 mL/min — ABNORMAL LOW (ref >60)
Glucose, Bld: 133 mg/dL — ABNORMAL HIGH (ref 70–99)
Potassium: 4.5 mmol/L (ref 3.5–5.1)
Sodium: 140 mmol/L (ref 135–145)
Total Bilirubin: 0.7 mg/dL (ref 0.0–1.2)
Total Protein: 6.6 g/dL (ref 6.5–8.1)

## 2024-03-13 LAB — CBC WITH DIFFERENTIAL (CANCER CENTER ONLY)
Abs Immature Granulocytes: 0.02 10*3/uL (ref 0.00–0.07)
Basophils Absolute: 0 10*3/uL (ref 0.0–0.1)
Basophils Relative: 1 %
Eosinophils Absolute: 0.4 10*3/uL (ref 0.0–0.5)
Eosinophils Relative: 6 %
HCT: 38 % (ref 36.0–46.0)
Hemoglobin: 11.8 g/dL — ABNORMAL LOW (ref 12.0–15.0)
Immature Granulocytes: 0 %
Lymphocytes Relative: 14 %
Lymphs Abs: 1 10*3/uL (ref 0.7–4.0)
MCH: 26.5 pg (ref 26.0–34.0)
MCHC: 31.1 g/dL (ref 30.0–36.0)
MCV: 85.4 fL (ref 80.0–100.0)
Monocytes Absolute: 0.5 10*3/uL (ref 0.1–1.0)
Monocytes Relative: 7 %
Neutro Abs: 5.2 10*3/uL (ref 1.7–7.7)
Neutrophils Relative %: 72 %
Platelet Count: 258 10*3/uL (ref 150–400)
RBC: 4.45 MIL/uL (ref 3.87–5.11)
RDW: 17.8 % — ABNORMAL HIGH (ref 11.5–15.5)
WBC Count: 7.2 10*3/uL (ref 4.0–10.5)
nRBC: 0 % (ref 0.0–0.2)

## 2024-03-13 LAB — LACTATE DEHYDROGENASE: LDH: 166 U/L (ref 98–192)

## 2024-03-13 MED ORDER — IRON SUCROSE 20 MG/ML IV SOLN
200.0000 mg | Freq: Once | INTRAVENOUS | Status: AC
  Administered 2024-03-13: 200 mg via INTRAVENOUS

## 2024-03-13 MED ORDER — SODIUM CHLORIDE 0.9% FLUSH
10.0000 mL | Freq: Once | INTRAVENOUS | Status: AC | PRN
  Administered 2024-03-13: 10 mL
  Filled 2024-03-13: qty 10

## 2024-03-13 NOTE — Progress Notes (Signed)
 [facility] CONSULT NOTE  Patient Care Team: Antonio Baumgarten, MD as PCP - General (Internal Medicine) Gwyn Leos, MD as Consulting Physician (Hematology and Oncology)  CHIEF COMPLAINTS/PURPOSE OF CONSULTATION: Anemia   HEMATOLOGY HISTORY  #Chronic kidney disease-stage III/ ANEMIA: EGD [~10 y]/colonoscopy-postponed in april;[March 2021- Hb ~10.3; Iron  sat- 15%] ; recommend PO iron -  # CKD [Dr.Lateef]-GFR 40s.    HISTORY OF PRESENTING ILLNESS:  Patient ambulating-in wheel chair  Alone/ transportation  [name] [name] 73 y.o.  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   Patient s/p venofer . On PO iron . No constipation.   No abdominal pain discomfort dyspepsia.  Review of Systems  Constitutional:  Positive for malaise/fatigue. Negative for chills, diaphoresis, fever and weight loss.  HENT:  Negative for nosebleeds and sore throat.   Eyes:  Negative for double vision.  Respiratory:  Negative for cough, hemoptysis, sputum production, shortness of breath and wheezing.   Cardiovascular:  Negative for chest pain, palpitations, orthopnea and leg swelling.  Gastrointestinal:  Negative for abdominal pain, blood in stool, constipation, diarrhea, heartburn, melena, nausea and vomiting.  Genitourinary:  Negative for dysuria, frequency and urgency.  Musculoskeletal:  Positive for back pain and joint pain.  Skin: Negative.  Negative for itching and rash.  Neurological:  Negative for dizziness, tingling, focal weakness, weakness and headaches.  Endo/Heme/Allergies:  Does not bruise/bleed easily.  Psychiatric/Behavioral:  Negative for depression. The patient is not nervous/anxious and does not have insomnia.     MEDICAL HISTORY:  Past Medical History:  Diagnosis Date   Anemia    Asthma    Chronic airway obstruction (HCC)    Chronic kidney disease    F/U WITH DR LATEEF   Diabetes mellitus without  complication (HCC)    Dyspnea    WITH EXERTION   GERD (gastroesophageal reflux disease)    Heart murmur    ASYMPTOMATIC   History of adenomatous polyp of colon    History of bone density study    Hyperlipidemia    Hypertension    Idiopathic peripheral neuropathy    Morbid obesity (HCC)    40.0-44.9   Osteoporosis    Plantar fascial fibromatosis    Pulmonary fibrosis (HCC)    Sleep apnea    TIA (transient ischemic attack)     SURGICAL HISTORY: Past Surgical History:  Procedure Laterality Date   ABDOMINAL HYSTERECTOMY     BACK SURGERY     BLADDER SUSPENSION     CESAREAN SECTION     COLONOSCOPY     COLONOSCOPY N/A 06/29/2021   Procedure: COLONOSCOPY;  Surgeon: Toledo, Alphonsus Jeans, MD;  Location: [facility];  Service: Gastroenterology;  Laterality: N/A;   COLONOSCOPY  12/30/2023   Procedure: COLONOSCOPY, WITH ARGON PLASMA COAGULATION;  Surgeon: Shane Darling, MD;  Location: [facility];  Service: Endoscopy;;   COLONOSCOPY WITH PROPOFOL  N/A 08/30/2020   Procedure: COLONOSCOPY WITH PROPOFOL ;  Surgeon: Shane Darling, MD;  Location: [facility];  Service: Endoscopy;  Laterality: N/A;   COLONOSCOPY WITH PROPOFOL  N/A 12/30/2023   Procedure: COLONOSCOPY WITH PROPOFOL ;  Surgeon: Shane Darling, MD;  Location: [facility];  Service: Endoscopy;  Laterality: N/A;  DM   DILATION AND CURETTAGE OF UTERUS     ESOPHAGOGASTRODUODENOSCOPY     ESOPHAGOGASTRODUODENOSCOPY N/A 06/29/2021   Procedure: ESOPHAGOGASTRODUODENOSCOPY (EGD);  Surgeon: Toledo, Alphonsus Jeans, MD;  Location: [facility];  Service: Gastroenterology;  Laterality: N/A;   ESOPHAGOGASTRODUODENOSCOPY (EGD) WITH  PROPOFOL  N/A 08/30/2020   Procedure: ESOPHAGOGASTRODUODENOSCOPY (EGD) WITH PROPOFOL ;  Surgeon: Shane Darling, MD;  Location: [facility];  Service: Endoscopy;  Laterality: N/A;   ESOPHAGOGASTRODUODENOSCOPY (EGD) WITH PROPOFOL  N/A 12/30/2023   Procedure: ESOPHAGOGASTRODUODENOSCOPY (EGD) WITH  PROPOFOL ;  Surgeon: Shane Darling, MD;  Location: [facility];  Service: Endoscopy;  Laterality: N/A;   IR BONE MARROW BIOPSY & ASPIRATION  11/21/2023   LUMBAR LAMINECTOMY/DECOMPRESSION MICRODISCECTOMY N/A 10/15/2016   Procedure: L3-L5 Laminectomy ;  Surgeon: Berta Brittle, MD;  Location: [facility];  Service: Neurosurgery;  Laterality: N/A;   LUMBAR WOUND DEBRIDEMENT N/A 11/05/2016   Procedure: LUMBAR WOUND DEBRIDEMENT;  Surgeon: Berta Brittle, MD;  Location: [facility];  Service: Neurosurgery;  Laterality: N/A;   POLYPECTOMY  12/30/2023   Procedure: POLYPECTOMY, INTESTINE;  Surgeon: Shane Darling, MD;  Location: [facility];  Service: Endoscopy;;   REVERSE SHOULDER ARTHROPLASTY Right 05/05/2020   Procedure: REVERSE SHOULDER ARTHROPLASTY;  Surgeon: Elner Hahn, MD;  Location: [facility];  Service: Orthopedics;  Laterality: Right;   REVERSE SHOULDER ARTHROPLASTY Left 02/23/2021   Procedure: REVERSE SHOULDER ARTHROPLASTY;  Surgeon: Elner Hahn, MD;  Location: [facility];  Service: Orthopedics;  Laterality: Left;   TUBAL LIGATION     VEIN LIGATION AND STRIPPING      SOCIAL HISTORY: Social History   Socioeconomic History   Marital status: Single    Spouse name: Not on file   Number of children: Not on file   Years of education: Not on file   Highest education level: Not on file  Occupational History   Not on file  Tobacco Use   Smoking status: Former    Current packs/day: 0.00    Average packs/day: 3.0 packs/day for 30.0 years (90.0 ttl pk-yrs)    Types: Cigarettes    Start date: 04/28/1970    Quit date: 04/28/2000    Years since quitting: 23.8   Smokeless tobacco: Never  Vaping Use   Vaping status: Never Used  Substance and Sexual Activity   Alcohol use: Not Currently   Drug use: No   Sexual activity: Not on file  Other Topics Concern   Not on file  Social History Narrative   Lives in [place]; by self; walks with walker/gait instability; quit smoking 20 years ago;  no alcohol.    Social Drivers of Corporate investment banker Strain: Low Risk  (02/19/2024)   Received from [facility]   Overall Financial Resource Strain (CARDIA)    Difficulty of Paying Living Expenses: Not hard at all  Food Insecurity: No Food Insecurity (02/19/2024)   Received from [facility]   Hunger Vital Sign    Worried About Running Out of Food in the Last Year: Never true    Ran Out of Food in the Last Year: Never true  Transportation Needs: No Transportation Needs (02/19/2024)   Received from [facility] - Transportation    In the past 12 months, has lack of transportation kept you from medical appointments or from getting medications?: No    Lack of Transportation (Non-Medical): No  Physical Activity: Not on file  Stress: Not on file  Social Connections: Not on file  Intimate Partner Violence: Not on file    FAMILY HISTORY: Family History  Problem Relation Age of Onset   Stroke Mother    Heart attack Mother    Breast cancer Sister 76   Stroke Sister    Heart attack Sister  Breast cancer Cousin        maternal side 60's    ALLERGIES:  is allergic to codeine, furosemide, penicillin v potassium, quinine, tramadol , and tylenol  [acetaminophen ].  MEDICATIONS:  Current Outpatient Medications  Medication Sig Dispense Refill   clotrimazole -betamethasone  (LOTRISONE ) cream Apply 1 application topically 2 (two) times daily as needed (itching).      erythromycin ophthalmic ointment SMARTSIG:In Eye(s)     ferrous sulfate  325 (65 FE) MG tablet Take 1 tablet (325 mg total) by mouth 2 (two) times daily with a meal. (Patient taking differently: Take 325 mg by mouth daily with breakfast.) 60 tablet 1   hydrocortisone 2.5 % cream Apply 1 application topically 2 (two) times daily. (Apply to face as spot treatment)     ketoconazole (NIZORAL) 2 % cream Apply 1 application  topically 2 (two) times daily. (Apply to areas  on face)     losartan  (COZAAR ) 50 MG tablet Take 50 mg by mouth every morning.      mometasone -formoterol  (DULERA ) 100-5 MCG/ACT AERO Inhale 2 puffs into the lungs 2 (two) times daily as needed for wheezing or shortness of breath.     ondansetron  (ZOFRAN -ODT) 4 MG disintegrating tablet Take 1 tablet (4 mg total) by mouth every 8 (eight) hours as needed for nausea or vomiting. 12 tablet 0   oxyCODONE  (OXY IR/ROXICODONE ) 5 MG immediate release tablet Take 1-2 tablets (5-10 mg total) by mouth every 4 (four) hours as needed for moderate pain. (Patient taking differently: Take 5 mg by mouth 2 (two) times daily as needed for moderate pain (pain score 4-6) or severe pain (pain score 7-10).) 60 tablet 0   pantoprazole  (PROTONIX ) 40 MG tablet Take 1 tablet (40 mg total) by mouth daily. 30 tablet 1   No current facility-administered medications for this visit.    PHYSICAL EXAMINATION:  # positive heart murmur.   Vitals:   03/13/24 1241 03/13/24 1309  BP: (!) 123/107 (!) 157/66  Pulse: 75   Resp: 18   Temp: 97.9 F (36.6 C)   SpO2: 99%     Filed Weights   03/13/24 1241  Weight: 176 lb (79.8 kg)     Physical Exam Constitutional:      Comments: Obese.  In a wheelchair.  HENT:     Head: Normocephalic and atraumatic.     Mouth/Throat:     Pharynx: No oropharyngeal exudate.  Eyes:     Pupils: Pupils are equal, round, and reactive to light.  Cardiovascular:     Rate and Rhythm: Normal rate and regular rhythm.     Heart sounds: Murmur heard.  Pulmonary:     Effort: Pulmonary effort is normal. No respiratory distress.     Breath sounds: Normal breath sounds. No wheezing.  Abdominal:     General: Bowel sounds are normal. There is no distension.     Palpations: Abdomen is soft. There is no mass.     Tenderness: There is no abdominal tenderness. There is no guarding or rebound.  Musculoskeletal:        General: No tenderness. Normal range of motion.     Cervical back: Normal range of  motion and neck supple.  Skin:    General: Skin is warm.  Neurological:     Mental Status: She is alert and oriented to person, place, and time.  Psychiatric:        Mood and Affect: Affect normal.     LABORATORY DATA:  I have reviewed the data as  listed Lab Results  Component Value Date   WBC 7.2 03/13/2024   HGB 11.8 (L) 03/13/2024   HCT 38.0 03/13/2024   MCV 85.4 03/13/2024   PLT 258 03/13/2024   Recent Labs    11/05/23 1248 12/12/23 0810 03/13/24 1247  NA 140 139 140  K 4.0 4.0 4.5  CL 108 105 109  CO2 23 23 22   GLUCOSE 93 103* 133*  BUN 13 16 15   CREATININE 1.28* 1.17* 1.21*  CALCIUM  8.6* 9.0 8.5*  GFRNONAA 45* 50* 48*  PROT 6.8 7.3 6.6  ALBUMIN 3.8 4.1 3.7  AST 22 23 22   ALT 16 11 13   ALKPHOS 67 80 86  BILITOT 0.5 0.5 0.7     No results found.   Normocytic anemia #Anemia secondary to chronic kidney disease-[nephrology office; March 2021]-iron  saturation 15% recently noted to have worsening anemia;  JUNE 2023- Hb 9. Iron  studies pending-  On PO iron /day [no GI issues] - FEB 2025- bone marrow Biopsy:  Mildly hypercellular bone marrow with mild erythroid dyspoiesis, The overall marrow is mildly hypercellular for age.  On the aspirate smear slides there is mild nuclear irregularity and extremely rare budding to the erythroid precursors; however, this is insufficient for diagnosis of dysplasia.  In overall definitive etiology for the patient's anemia is not identified.  The above findings could be related to a stress dyserythropoiesis, but a MDS cannot be completely excluded. Cytogenetics-NEG; NGS- NEG. haptoglobin; retic count;  b12 ; MM panel; K/L lamda light chains- Negative.   # .Proceed with venofer - continue PO iron . Today Hb 11.8.    # Etiology- chronic  GI loss. [facility] s/p APC; EGD-]  # CKD- III-GFR 40-50s-diabetes/hypertension-followed by nephrology [Dr.Lateef].  # DISPOSITION: # Venofer  #  Follow up in 6  months- MD- labs- cbc/cmp/; LDH; iron  studies; ferritin; -- Possible venofer --Dr.B  All questions were answered. The patient knows to call the clinic with any problems, questions or concerns.    Gwyn Leos, MD 03/13/2024 1:41 PM

## 2024-03-13 NOTE — Assessment & Plan Note (Addendum)
#  Anemia secondary to chronic kidney disease-[nephrology office; March 2021]-iron  saturation 15% recently noted to have worsening anemia;  JUNE 2023- Hb 9. Iron  studies pending-  On PO iron /day [no GI issues] - FEB 2025- bone marrow Biopsy:  Mildly hypercellular bone marrow with mild erythroid dyspoiesis, The overall marrow is mildly hypercellular for age.  On the aspirate smear slides there is mild nuclear irregularity and extremely rare budding to the erythroid precursors; however, this is insufficient for diagnosis of dysplasia.  In overall definitive etiology for the patient's anemia is not identified.  The above findings could be related to a stress dyserythropoiesis, but a MDS cannot be completely excluded. Cytogenetics-NEG; NGS- NEG. haptoglobin; retic count;  b12 ; MM panel; K/L lamda light chains- Negative.   # .Proceed with venofer - continue PO iron . Today Hb 11.8.    # Etiology- chronic  GI loss. [facility]io [facility]asis s/p APC; EGD-]  # CKD- III-GFR 40-50s-diabetes/hypertension-followed by nephrology [Dr.Lateef].  # DISPOSITION: # Venofer  # Follow up in 6  months- MD- labs- cbc/cmp/; LDH; iron  studies; ferritin; -- Possible venofer --Dr.B

## 2024-03-13 NOTE — Progress Notes (Signed)
 Declined post-observation. Aware of risks. Vitals stable at discharge.

## 2024-03-13 NOTE — Patient Instructions (Signed)

## 2024-03-13 NOTE — Progress Notes (Signed)
 Fatigue/weakness: NO  Dyspena: YES Light headedness: NO Blood in stool: NO   She had a hip xray yesterday with Dr. Kevan Peers at [facility].

## 2024-04-01 ENCOUNTER — Other Ambulatory Visit: Payer: Self-pay | Admitting: Internal Medicine

## 2024-04-01 DIAGNOSIS — R519 Headache, unspecified: Secondary | ICD-10-CM

## 2024-04-13 ENCOUNTER — Ambulatory Visit: Admission: RE | Admit: 2024-04-13 | Source: Ambulatory Visit

## 2024-04-23 ENCOUNTER — Ambulatory Visit
Admission: RE | Admit: 2024-04-23 | Discharge: 2024-04-23 | Disposition: A | Discharge status: Home / Self Care | Source: Ambulatory Visit | Attending: Internal Medicine | Admitting: Internal Medicine

## 2024-04-23 DIAGNOSIS — R519 Headache, unspecified: Secondary | ICD-10-CM | POA: Diagnosis present

## 2024-05-04 DIAGNOSIS — R7309 Other abnormal glucose: Secondary | ICD-10-CM | POA: Insufficient documentation

## 2024-05-22 ENCOUNTER — Ambulatory Visit (INDEPENDENT_AMBULATORY_CARE_PROVIDER_SITE_OTHER): Admitting: Podiatry

## 2024-05-22 DIAGNOSIS — L84 Corns and callosities: Secondary | ICD-10-CM

## 2024-05-22 DIAGNOSIS — M79674 Pain in right toe(s): Secondary | ICD-10-CM | POA: Diagnosis not present

## 2024-05-22 DIAGNOSIS — B351 Tinea unguium: Secondary | ICD-10-CM | POA: Diagnosis not present

## 2024-05-22 DIAGNOSIS — E119 Type 2 diabetes mellitus without complications: Secondary | ICD-10-CM | POA: Diagnosis not present

## 2024-05-22 DIAGNOSIS — M79675 Pain in left toe(s): Secondary | ICD-10-CM | POA: Diagnosis not present

## 2024-05-29 ENCOUNTER — Encounter: Payer: Self-pay | Admitting: Podiatry

## 2024-05-29 NOTE — Progress Notes (Signed)
  Subjective:  Patient ID: [name] [name], female    DOB: [birth year],  MRN: [id number]  [name] [name] presents to clinic today for preventative diabetic foot care and painful thick toenails that are difficult to trim. Pain interferes with ambulation. Aggravating factors include wearing enclosed shoe gear. Pain is relieved with periodic professional debridement.  Chief Complaint  Patient presents with   Nail Problem    Thick painful toenails, 3 month follow up    New problem(s): Patient complains about bunion of right foot which has been painful.  PCP is Sadie Manna, MD. [name] [name] 03/12/2024.  Allergies  Allergen Reactions   Codeine Nausea And Vomiting   Furosemide Other (See Comments)   Penicillin V Potassium     Other reaction(s): Unknown   Quinine     Other reaction(s): Unknown   Tramadol      Other reaction(s): Unknown   Tylenol  [Acetaminophen ]     Tylenol -Codeine: Dizziness    Review of Systems: Negative except as noted in the HPI.  Objective: No changes noted in today's physical examination. There were no vitals filed for this visit. [name] [name] is a pleasant 73 y.o. female WD, WN in NAD. AAO x 3.  Vascular Examination: Capillary refill time immediate b/l. Vascular status intact b/l with palpable pedal pulses. Pedal hair present b/l. No pain with calf compression b/l. Skin temperature gradient WNL b/l. No cyanosis or clubbing b/l. No ischemia or gangrene noted b/l. No edema noted b/l LE.  Neurological Examination: Sensation grossly intact b/l with 10 gram monofilament. Vibratory sensation intact b/l.   Dermatological Examination: Pedal skin with normal turgor, texture and tone b/l.  No open wounds. No interdigital macerations.   Toenails 1-5 b/l thick, discolored, elongated with subungual debris and pain on dorsal palpation.   Hyperkeratotic lesion(s) submet head 5 left foot.  No erythema, no edema, no drainage, no fluctuance.  Musculoskeletal Examination: Muscle  strength 5/5 to all lower extremity muscle groups bilaterally. No pain, crepitus or joint limitation noted with ROM b/l LE. Hallux valgus with bunion deformity noted right lower extremity. Patient ambulates with cane assistance.  Radiographs: None  Assessment/Plan: No diagnosis found.  -Patient was evaluated and treated. All patient's and/or POA's questions/concerns addressed on today's visit. Toenails 1-5 debrided in length and girth without incident. Callus(es) submet head 5 left foot pared with sharp debridement without incident. Continue daily foot inspections and monitor blood glucose per PCP/Endocrinologist's recommendations. Continue soft, supportive shoe gear daily. Report any pedal injuries to medical professional.  -Patient referred to Dr. Thresa Sar for evaluation of symptomatic bunion deformity left foot. -Patient/POA to call should there be question/concern in the interim.   Return in about 3 months (around 08/22/2024).  [name] [name], DPM      [place] LOCATION: [address], [place] [address]                   Office [phone]   [facility] LOCATION: [address] [place], [place] [address] Office [phone]

## 2024-06-23 ENCOUNTER — Ambulatory Visit (INDEPENDENT_AMBULATORY_CARE_PROVIDER_SITE_OTHER): Admitting: Podiatry

## 2024-06-23 DIAGNOSIS — M2011 Hallux valgus (acquired), right foot: Secondary | ICD-10-CM | POA: Diagnosis not present

## 2024-06-23 NOTE — Progress Notes (Signed)
 Chief Complaint  Patient presents with   Bunions    Patient has right foot bunion - it has been red and swollen and painful in the past, but now it does not hurt and the swelling ha s gone down. H/O gout in other joints. Patient wants Dr. Janit to look at it and make sure it's alright.     HPI: 73 y.o. female presenting today for follow-up evaluation of possible acute gout to the first MTP of the right foot.  Originally noticed significant swelling and warmth to the great toe joint of the right foot but it has since resolved.  She no longer has any pain or tenderness or swelling to the area  Past Medical History:  Diagnosis Date   Anemia    Asthma    Chronic airway obstruction (HCC)    Chronic kidney disease    F/U WITH DR LATEEF   Diabetes mellitus without complication (HCC)    Dyspnea    WITH EXERTION   GERD (gastroesophageal reflux disease)    Heart murmur    ASYMPTOMATIC   History of adenomatous polyp of colon    History of bone density study    Hyperlipidemia    Hypertension    Idiopathic peripheral neuropathy    Morbid obesity (HCC)    40.0-44.9   Osteoporosis    Plantar fascial fibromatosis    Pulmonary fibrosis (HCC)    Sleep apnea    TIA (transient ischemic attack)     Past Surgical History:  Procedure Laterality Date   ABDOMINAL HYSTERECTOMY     BACK SURGERY     BLADDER SUSPENSION     CESAREAN SECTION     COLONOSCOPY     COLONOSCOPY N/A 06/29/2021   Procedure: COLONOSCOPY;  Surgeon: Toledo, Ladell POUR, MD;  Location: [facility];  Service: Gastroenterology;  Laterality: N/A;   COLONOSCOPY  12/30/2023   Procedure: COLONOSCOPY, WITH ARGON PLASMA COAGULATION;  Surgeon: Maryruth Ole DASEN, MD;  Location: [facility];  Service: Endoscopy;;   COLONOSCOPY WITH PROPOFOL  N/A 08/30/2020   Procedure: COLONOSCOPY WITH PROPOFOL ;  Surgeon: Maryruth Ole DASEN, MD;  Location: [facility];  Service: Endoscopy;  Laterality: N/A;   COLONOSCOPY WITH PROPOFOL  N/A  12/30/2023   Procedure: COLONOSCOPY WITH PROPOFOL ;  Surgeon: Maryruth Ole DASEN, MD;  Location: [facility];  Service: Endoscopy;  Laterality: N/A;  DM   DILATION AND CURETTAGE OF UTERUS     ESOPHAGOGASTRODUODENOSCOPY     ESOPHAGOGASTRODUODENOSCOPY N/A 06/29/2021   Procedure: ESOPHAGOGASTRODUODENOSCOPY (EGD);  Surgeon: Toledo, Ladell POUR, MD;  Location: [facility];  Service: Gastroenterology;  Laterality: N/A;   ESOPHAGOGASTRODUODENOSCOPY (EGD) WITH PROPOFOL  N/A 08/30/2020   Procedure: ESOPHAGOGASTRODUODENOSCOPY (EGD) WITH PROPOFOL ;  Surgeon: Maryruth Ole DASEN, MD;  Location: [facility];  Service: Endoscopy;  Laterality: N/A;   ESOPHAGOGASTRODUODENOSCOPY (EGD) WITH PROPOFOL  N/A 12/30/2023   Procedure: ESOPHAGOGASTRODUODENOSCOPY (EGD) WITH PROPOFOL ;  Surgeon: Maryruth Ole DASEN, MD;  Location: [facility];  Service: Endoscopy;  Laterality: N/A;   IR BONE MARROW BIOPSY & ASPIRATION  11/21/2023   LUMBAR LAMINECTOMY/DECOMPRESSION MICRODISCECTOMY N/A 10/15/2016   Procedure: L3-L5 Laminectomy ;  Surgeon: Elspeth Ahle, MD;  Location: [facility];  Service: Neurosurgery;  Laterality: N/A;   LUMBAR WOUND DEBRIDEMENT N/A 11/05/2016   Procedure: LUMBAR WOUND DEBRIDEMENT;  Surgeon: Elspeth Ahle, MD;  Location: [facility];  Service: Neurosurgery;  Laterality: N/A;   POLYPECTOMY  12/30/2023   Procedure: POLYPECTOMY, INTESTINE;  Surgeon: Maryruth Ole DASEN, MD;  Location: [facility];  Service: Endoscopy;;   REVERSE SHOULDER  ARTHROPLASTY Right 05/05/2020   Procedure: REVERSE SHOULDER ARTHROPLASTY;  Surgeon: Edie Norleen PARAS, MD;  Location: [facility];  Service: Orthopedics;  Laterality: Right;   REVERSE SHOULDER ARTHROPLASTY Left 02/23/2021   Procedure: REVERSE SHOULDER ARTHROPLASTY;  Surgeon: Edie Norleen PARAS, MD;  Location: [facility];  Service: Orthopedics;  Laterality: Left;   TUBAL LIGATION     VEIN LIGATION AND STRIPPING      Allergies  Allergen Reactions   Codeine Nausea And Vomiting   Furosemide  Other (See Comments)   Penicillin V Potassium     Other reaction(s): Unknown   Quinine     Other reaction(s): Unknown   Tramadol      Other reaction(s): Unknown   Tylenol  [Acetaminophen ]     Tylenol -Codeine: Dizziness     Physical Exam: General: The patient is alert and oriented x3 in no acute distress.  Dermatology: Skin is warm, dry and supple bilateral lower extremities.  No open wounds  Vascular: Palpable pedal pulses bilaterally. Capillary refill within normal limits.  No appreciable edema.  No erythema.  Neurological: Diminished via light touch  Musculoskeletal Exam: Hallux valgus deformity noted right foot.  Today there is no tenderness with palpation or range of motion  Assessment/Plan of Care: 1.  Hallux valgus deformity with onset of possible acute gout right foot; currently asymptomatic  -Patient evaluated -Over the last week the patient states that the pain has completely resolved. -Recommend wide fitting shoes that do not irritate or constrict the toebox area -Return to clinic next scheduled appointment with Dr. May for routine footcare     Thresa EMERSON Sar, DPM [facility]  Dr. Thresa EMERSON Sar, DPM    [address] [place], [place] [address]                Office [phone]  Fax [phone]

## 2024-07-06 ENCOUNTER — Other Ambulatory Visit: Payer: Self-pay | Admitting: Internal Medicine

## 2024-07-06 ENCOUNTER — Telehealth: Payer: Self-pay | Admitting: *Deleted

## 2024-07-06 DIAGNOSIS — Z1231 Encounter for screening mammogram for malignant neoplasm of breast: Secondary | ICD-10-CM

## 2024-07-06 NOTE — Telephone Encounter (Signed)
 She wants to know the next time that she is going to be coming to see Dr. Rennie.  Told her the only 1 that is in there now is September 14, 2024 and the time is 1:30. She is ok with this.

## 2024-08-07 ENCOUNTER — Encounter: Payer: Self-pay | Admitting: Internal Medicine

## 2024-08-07 NOTE — Telephone Encounter (Signed)
 Purcell Reena GRADE, RN  Launie Curt Lael Rosaline DELENA, RN; Joshua Alfonso CROME, CMA; Rennie Cindy SAUNDERS, MD8 months ago   She has appt for 2/17 but she does not have enough time  for her to get transportation  and she wants to come 2/18 and she will call now to set up her transportation. She would like someone to call her back with the new appt and time

## 2024-08-13 ENCOUNTER — Ambulatory Visit
Admission: RE | Admit: 2024-08-13 | Discharge: 2024-08-13 | Disposition: A | Discharge status: Home / Self Care | Source: Ambulatory Visit | Attending: Internal Medicine | Admitting: Internal Medicine

## 2024-08-13 DIAGNOSIS — Z1231 Encounter for screening mammogram for malignant neoplasm of breast: Secondary | ICD-10-CM | POA: Insufficient documentation

## 2024-08-24 ENCOUNTER — Encounter: Payer: Self-pay | Admitting: Podiatry

## 2024-08-24 ENCOUNTER — Ambulatory Visit (INDEPENDENT_AMBULATORY_CARE_PROVIDER_SITE_OTHER): Admitting: Podiatry

## 2024-08-24 DIAGNOSIS — M79675 Pain in left toe(s): Secondary | ICD-10-CM | POA: Diagnosis not present

## 2024-08-24 DIAGNOSIS — L84 Corns and callosities: Secondary | ICD-10-CM

## 2024-08-24 DIAGNOSIS — B351 Tinea unguium: Secondary | ICD-10-CM | POA: Diagnosis not present

## 2024-08-24 DIAGNOSIS — E119 Type 2 diabetes mellitus without complications: Secondary | ICD-10-CM

## 2024-08-24 DIAGNOSIS — M79674 Pain in right toe(s): Secondary | ICD-10-CM | POA: Diagnosis not present

## 2024-08-30 NOTE — Progress Notes (Signed)
  Subjective:  Patient ID: [name] [name], female    DOB: 05-20-1951,  MRN: [id number]  [name] [name] presents to clinic today for preventative diabetic foot care and callus(es) left foot and painful mycotic toenails that are difficult to trim. Painful toenails interfere with ambulation. Aggravating factors include wearing enclosed shoe gear. Pain is relieved with periodic professional debridement. Painful calluses are aggravated when weightbearing with and without shoegear. Pain is relieved with periodic professional debridement.  Chief Complaint  Patient presents with   Diabetes    Dr. Sadie is her PCP. A1c 6.3. Last office visit was in July   New problem(s): None.   PCP is [name] Manna, MD.  Allergies  Allergen Reactions   Codeine Nausea And Vomiting   Furosemide Other (See Comments)   Penicillin V Potassium     Other reaction(s): Unknown   Quinine     Other reaction(s): Unknown   Tramadol      Other reaction(s): Unknown   Tylenol  [Acetaminophen ]     Tylenol -Codeine: Dizziness    Review of Systems: Negative except as noted in the HPI.  Objective: No changes noted in today's physical examination. There were no vitals filed for this visit. [name] [name] is a pleasant 73 y.o. female WD, WN in NAD. AAO x 3.  Vascular Examination: Capillary refill time immediate b/l. Vascular status intact b/l with palpable pedal pulses. Pedal hair present b/l. No pain with calf compression b/l. Skin temperature gradient WNL b/l. No cyanosis or clubbing b/l. No ischemia or gangrene noted b/l. No edema noted b/l LE.  Neurological Examination: Sensation grossly intact b/l with 10 gram monofilament. Vibratory sensation intact b/l.   Dermatological Examination: Pedal skin with normal turgor, texture and tone b/l.  No open wounds. No interdigital macerations.   Toenails 1-5 b/l thick, discolored, elongated with subungual debris and pain on dorsal palpation.   Hyperkeratotic lesion(s) submet  head 5 left foot.  No erythema, no edema, no drainage, no fluctuance.  Musculoskeletal Examination: Muscle strength 5/5 to all lower extremity muscle groups bilaterally. No pain, crepitus or joint limitation noted with ROM b/l LE. Hallux valgus with bunion deformity noted right lower extremity. Patient ambulates with cane assistance.  Radiographs: None  Assessment/Plan: 1. Pain due to onychomycosis of toenails of both feet   2. Callus   3. Controlled type 2 diabetes mellitus without complication, without long-term current use of insulin  [facility])   Consent given for treatment. Patient examined. All patient's and/or POA's questions/concerns addressed on today's visit. Mycotic toenails 1-5 b/l  debrided in length and girth without incident. Callus(es) submet head 5 left foot pared with sharp debridement without incident. Continue foot and shoe inspections daily. Monitor blood glucose per PCP/Endocrinologist's recommendations.Continue soft, supportive shoe gear daily. Report any pedal injuries to medical professional. Call office if there are any quesitons/concerns. Return in about 3 months (around 11/24/2024).  [name] [name], DPM      [place] LOCATION: [address], [place] [address]                   Office [phone]   [facility] LOCATION: [address] [place], [place] [address] Office [phone]

## 2024-09-07 ENCOUNTER — Telehealth: Payer: Self-pay | Admitting: Internal Medicine

## 2024-09-07 NOTE — Telephone Encounter (Signed)
 PT transport called to confirm date/time of appt for transport purposes - LH

## 2024-09-14 ENCOUNTER — Other Ambulatory Visit

## 2024-09-14 ENCOUNTER — Ambulatory Visit

## 2024-09-14 ENCOUNTER — Inpatient Hospital Stay: Admitting: Internal Medicine

## 2024-09-14 ENCOUNTER — Telehealth: Payer: Self-pay | Admitting: Internal Medicine

## 2024-09-14 NOTE — Telephone Encounter (Signed)
 Pt doesn't feel good and wants to r/s appts from today to another day. Appts r/s to next available that will fit all (lab/MD/infusion) appts. Date and times confirmed with pt.

## 2024-10-06 ENCOUNTER — Inpatient Hospital Stay (HOSPITAL_BASED_OUTPATIENT_CLINIC_OR_DEPARTMENT_OTHER): Admitting: Internal Medicine

## 2024-10-06 ENCOUNTER — Encounter: Payer: Self-pay | Admitting: Internal Medicine

## 2024-10-06 ENCOUNTER — Inpatient Hospital Stay

## 2024-10-06 ENCOUNTER — Inpatient Hospital Stay: Attending: Internal Medicine

## 2024-10-06 VITALS — BP 141/59 | HR 65 | Temp 97.4°F | Resp 19

## 2024-10-06 VITALS — BP 143/62 | HR 99 | Resp 20 | Ht 59.0 in | Wt 189.3 lb

## 2024-10-06 DIAGNOSIS — Z79899 Other long term (current) drug therapy: Secondary | ICD-10-CM | POA: Diagnosis not present

## 2024-10-06 DIAGNOSIS — D649 Anemia, unspecified: Secondary | ICD-10-CM | POA: Diagnosis not present

## 2024-10-06 DIAGNOSIS — M79604 Pain in right leg: Secondary | ICD-10-CM | POA: Insufficient documentation

## 2024-10-06 DIAGNOSIS — D631 Anemia in chronic kidney disease: Secondary | ICD-10-CM

## 2024-10-06 DIAGNOSIS — D509 Iron deficiency anemia, unspecified: Secondary | ICD-10-CM | POA: Diagnosis present

## 2024-10-06 DIAGNOSIS — E119 Type 2 diabetes mellitus without complications: Secondary | ICD-10-CM | POA: Insufficient documentation

## 2024-10-06 DIAGNOSIS — K08109 Complete loss of teeth, unspecified cause, unspecified class: Secondary | ICD-10-CM | POA: Insufficient documentation

## 2024-10-06 LAB — CMP (CANCER CENTER ONLY)
ALT: 14 U/L (ref 0–44)
AST: 26 U/L (ref 15–41)
Albumin: 4.2 g/dL (ref 3.5–5.0)
Alkaline Phosphatase: 108 U/L (ref 38–126)
Anion gap: 12 (ref 5–15)
BUN: 12 mg/dL (ref 8–23)
CO2: 24 mmol/L (ref 22–32)
Calcium: 9.3 mg/dL (ref 8.9–10.3)
Chloride: 104 mmol/L (ref 98–111)
Creatinine: 1.02 mg/dL — ABNORMAL HIGH (ref 0.44–1.00)
GFR, Estimated: 59 mL/min — ABNORMAL LOW
Glucose, Bld: 115 mg/dL — ABNORMAL HIGH (ref 70–99)
Potassium: 3.8 mmol/L (ref 3.5–5.1)
Sodium: 140 mmol/L (ref 135–145)
Total Bilirubin: 0.4 mg/dL (ref 0.0–1.2)
Total Protein: 7.1 g/dL (ref 6.5–8.1)

## 2024-10-06 LAB — CBC WITH DIFFERENTIAL (CANCER CENTER ONLY)
Abs Immature Granulocytes: 0.07 K/uL (ref 0.00–0.07)
Basophils Absolute: 0.1 K/uL (ref 0.0–0.1)
Basophils Relative: 1 %
Eosinophils Absolute: 0.3 K/uL (ref 0.0–0.5)
Eosinophils Relative: 3 %
HCT: 40 % (ref 36.0–46.0)
Hemoglobin: 13 g/dL (ref 12.0–15.0)
Immature Granulocytes: 1 %
Lymphocytes Relative: 13 %
Lymphs Abs: 1.1 K/uL (ref 0.7–4.0)
MCH: 30.1 pg (ref 26.0–34.0)
MCHC: 32.5 g/dL (ref 30.0–36.0)
MCV: 92.6 fL (ref 80.0–100.0)
Monocytes Absolute: 0.6 K/uL (ref 0.1–1.0)
Monocytes Relative: 7 %
Neutro Abs: 6.2 K/uL (ref 1.7–7.7)
Neutrophils Relative %: 75 %
Platelet Count: 241 K/uL (ref 150–400)
RBC: 4.32 MIL/uL (ref 3.87–5.11)
RDW: 14.6 % (ref 11.5–15.5)
WBC Count: 8.2 K/uL (ref 4.0–10.5)
nRBC: 0 % (ref 0.0–0.2)

## 2024-10-06 LAB — IRON AND TIBC
Iron: 70 ug/dL (ref 28–170)
Saturation Ratios: 22 % (ref 10.4–31.8)
TIBC: 315 ug/dL (ref 250–450)
UIBC: 245 ug/dL

## 2024-10-06 LAB — FERRITIN: Ferritin: 238 ng/mL (ref 11–307)

## 2024-10-06 LAB — LACTATE DEHYDROGENASE: LDH: 337 U/L — ABNORMAL HIGH (ref 105–235)

## 2024-10-06 MED ORDER — IRON SUCROSE 20 MG/ML IV SOLN
200.0000 mg | Freq: Once | INTRAVENOUS | Status: AC
  Administered 2024-10-06: 200 mg via INTRAVENOUS

## 2024-10-06 NOTE — Progress Notes (Signed)
 Patient has no concerns

## 2024-10-06 NOTE — Assessment & Plan Note (Signed)
#  Anemia secondary to chronic kidney disease-[nephrology office; March 2021]-iron  saturation 15% recently noted to have worsening anemia;  JUNE 2023- Hb 9. Iron  studies pending-  On PO iron /day [no GI issues] - FEB 2025- bone marrow Biopsy:  Mildly hypercellular bone marrow with mild erythroid dyspoiesis, The overall marrow is mildly hypercellular for age.  On the aspirate smear slides there is mild nuclear irregularity and extremely rare budding to the erythroid precursors; however, this is insufficient for diagnosis of dysplasia.  In overall definitive etiology for the patient's anemia is not identified.  The above findings could be related to a stress dyserythropoiesis, but a MDS cannot be completely excluded. Cytogenetics-NEG; NGS- NEG. haptoglobin; retic count;  b12 ; MM panel; K/L lamda light chains- Negative.   # .Proceed with venofer - continue PO iron . Today Hb 13- continue IV Venofer .   # Etiology- chronic  GI loss. [facility] angio ectasis s/p APC; EGD-]  # CKD- III-GFR 40-50s-diabetes/hypertension-followed by nephrology [Dr.Lateef]- stable.   # DISPOSITION: # Venofer  # Follow up in 6  months- MD- labs- cbc/cmp/; LDH; iron  studies; ferritin; -- Possible venofer --Dr.B

## 2024-10-06 NOTE — Progress Notes (Signed)
 [facility] CONSULT NOTE  Patient Care Team: Sadie Manna, MD as PCP - General (Internal Medicine) Rennie Cindy SAUNDERS, MD as Consulting Physician (Oncology)  CHIEF COMPLAINTS/PURPOSE OF CONSULTATION: Anemia   HEMATOLOGY HISTORY  #Chronic kidney disease-stage III/ ANEMIA: EGD [~10 y]/colonoscopy-postponed in april;[March 2021- Hb ~10.3; Iron  sat- 15%] ; recommend PO iron -  # CKD [Dr.Lateef]-GFR 40s.    HISTORY OF PRESENTING ILLNESS:  Patient ambulating-in wheel chair  Alone/ transportation  [name] [name] 73 y.o.  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  Discussed the use of AI scribe software for clinical note transcription with the patient, who gave verbal consent to proceed.  History of Present Illness   [name] [name] is a 73 year old female with anemia secondary to chronic kidney disease stage 3 who presents for hematology follow-up and scheduled iron  infusion.  She has anemia managed with periodic intravenous iron  infusions, with her last infusion approximately six months ago. Her hemoglobin has improved from 11 to 13 g/dL, and she has tolerated infusions without adverse effects. She denies additional bleeding symptoms except for recent episodes of epistaxis, for which she plans to see an otolaryngologist this week.  She has chronic kidney disease stage 3, with recent laboratory results showing improvement in renal function within this stage. She reports no symptoms of fluid overload or worsening renal function.  She requires ongoing iron  infusions every six months due to her underlying kidney disease.   Review of Systems  Constitutional:  Positive for malaise/fatigue. Negative for chills, diaphoresis, fever and weight loss.  HENT:  Negative for nosebleeds and sore throat.   Eyes:  Negative for double vision.  Respiratory:  Negative for cough, hemoptysis, sputum production, shortness of breath  and wheezing.   Cardiovascular:  Negative for chest pain, palpitations, orthopnea and leg swelling.  Gastrointestinal:  Negative for abdominal pain, blood in stool, constipation, diarrhea, heartburn, melena, nausea and vomiting.  Genitourinary:  Negative for dysuria, frequency and urgency.  Musculoskeletal:  Positive for back pain and joint pain.  Skin: Negative.  Negative for itching and rash.  Neurological:  Negative for dizziness, tingling, focal weakness, weakness and headaches.  Endo/Heme/Allergies:  Does not bruise/bleed easily.  Psychiatric/Behavioral:  Negative for depression. The patient is not nervous/anxious and does not have insomnia.     MEDICAL HISTORY:  Past Medical History:  Diagnosis Date   Anemia    Asthma    Chronic airway obstruction (HCC)    Chronic kidney disease    F/U WITH DR LATEEF   Diabetes mellitus without complication (HCC)    Dyspnea    WITH EXERTION   GERD (gastroesophageal reflux disease)    Heart murmur    ASYMPTOMATIC   History of adenomatous polyp of colon    History of bone density study    Hyperlipidemia    Hypertension    Idiopathic peripheral neuropathy    Morbid obesity (HCC)    40.0-44.9   Osteoporosis    Plantar fascial fibromatosis    Pulmonary fibrosis (HCC)    Sleep apnea    TIA (transient ischemic attack)     SURGICAL HISTORY: Past Surgical History:  Procedure Laterality Date   ABDOMINAL HYSTERECTOMY     BACK SURGERY     BLADDER SUSPENSION     CESAREAN SECTION     COLONOSCOPY     COLONOSCOPY N/A 06/29/2021   Procedure: COLONOSCOPY;  Surgeon: Toledo, Ladell MARLA, MD;  Location: [facility];  Service: Gastroenterology;  Laterality: N/A;   COLONOSCOPY  12/30/2023   Procedure: COLONOSCOPY, WITH ARGON PLASMA COAGULATION;  Surgeon: Maryruth Ole DASEN, MD;  Location: [facility];  Service: Endoscopy;;   COLONOSCOPY WITH PROPOFOL  N/A 08/30/2020   Procedure: COLONOSCOPY WITH PROPOFOL ;  Surgeon: Maryruth Ole DASEN, MD;   Location: [facility];  Service: Endoscopy;  Laterality: N/A;   COLONOSCOPY WITH PROPOFOL  N/A 12/30/2023   Procedure: COLONOSCOPY WITH PROPOFOL ;  Surgeon: Maryruth Ole DASEN, MD;  Location: [facility];  Service: Endoscopy;  Laterality: N/A;  DM   DILATION AND CURETTAGE OF UTERUS     ESOPHAGOGASTRODUODENOSCOPY     ESOPHAGOGASTRODUODENOSCOPY N/A 06/29/2021   Procedure: ESOPHAGOGASTRODUODENOSCOPY (EGD);  Surgeon: Toledo, Ladell POUR, MD;  Location: [facility];  Service: Gastroenterology;  Laterality: N/A;   ESOPHAGOGASTRODUODENOSCOPY (EGD) WITH PROPOFOL  N/A 08/30/2020   Procedure: ESOPHAGOGASTRODUODENOSCOPY (EGD) WITH PROPOFOL ;  Surgeon: Maryruth Ole DASEN, MD;  Location: [facility];  Service: Endoscopy;  Laterality: N/A;   ESOPHAGOGASTRODUODENOSCOPY (EGD) WITH PROPOFOL  N/A 12/30/2023   Procedure: ESOPHAGOGASTRODUODENOSCOPY (EGD) WITH PROPOFOL ;  Surgeon: Maryruth Ole DASEN, MD;  Location: [facility];  Service: Endoscopy;  Laterality: N/A;   IR BONE MARROW BIOPSY & ASPIRATION  11/21/2023   LUMBAR LAMINECTOMY/DECOMPRESSION MICRODISCECTOMY N/A 10/15/2016   Procedure: L3-L5 Laminectomy ;  Surgeon: Elspeth Ahle, MD;  Location: [facility];  Service: Neurosurgery;  Laterality: N/A;   LUMBAR WOUND DEBRIDEMENT N/A 11/05/2016   Procedure: LUMBAR WOUND DEBRIDEMENT;  Surgeon: Elspeth Ahle, MD;  Location: [facility];  Service: Neurosurgery;  Laterality: N/A;   POLYPECTOMY  12/30/2023   Procedure: POLYPECTOMY, INTESTINE;  Surgeon: Maryruth Ole DASEN, MD;  Location: [facility];  Service: Endoscopy;;   REVERSE SHOULDER ARTHROPLASTY Right 05/05/2020   Procedure: REVERSE SHOULDER ARTHROPLASTY;  Surgeon: Edie Norleen PARAS, MD;  Location: [facility];  Service: Orthopedics;  Laterality: Right;   REVERSE SHOULDER ARTHROPLASTY Left 02/23/2021   Procedure: REVERSE SHOULDER ARTHROPLASTY;  Surgeon: Edie Norleen PARAS, MD;  Location: [facility];  Service: Orthopedics;  Laterality: Left;   TUBAL LIGATION     VEIN LIGATION  AND STRIPPING      SOCIAL HISTORY: Social History   Socioeconomic History   Marital status: Single    Spouse name: Not on file   Number of children: Not on file   Years of education: Not on file   Highest education level: Not on file  Occupational History   Not on file  Tobacco Use   Smoking status: Former    Current packs/day: 0.00    Average packs/day: 3.0 packs/day for 30.0 years (90.0 ttl pk-yrs)    Types: Cigarettes    Start date: 04/28/1970    Quit date: 04/28/2000    Years since quitting: 24.4   Smokeless tobacco: Never  Vaping Use   Vaping status: Never Used  Substance and Sexual Activity   Alcohol use: Not Currently   Drug use: No   Sexual activity: Not on file  Other Topics Concern   Not on file  Social History Narrative   Lives in [place]; by self; walks with walker/gait instability; quit smoking 20 years ago; no alcohol.    Social Drivers of Health   Tobacco Use: Medium Risk (10/06/2024)   Patient History    Smoking Tobacco Use: Former    Smokeless Tobacco Use: Never    Passive Exposure: Not on file  Financial Resource Strain: Low Risk  (02/19/2024)   Received from [facility]   Overall Financial Resource Strain (CARDIA)    Difficulty of Paying  Living Expenses: Not hard at all  Food Insecurity: No Food Insecurity (02/19/2024)   Received from [facility]   Epic    Within the past 12 months, you worried that your food would run out before you got the money to buy more.: Never true    Within the past 12 months, the food you bought just didn't last and you didn't have money to get more.: Never true  Transportation Needs: No Transportation Needs (02/19/2024)   Received from [facility] - Transportation    In the past 12 months, has lack of transportation kept you from medical appointments or from getting medications?: No    Lack of Transportation (Non-Medical): No  Physical Activity: Not on  file  Stress: Not on file  Social Connections: Not on file  Intimate Partner Violence: Not on file  Depression (EYV7-0): Not on file  Alcohol Screen: Not on file  Housing: Low Risk  (02/19/2024)   Received from [facility]   Epic    In the last 12 months, was there a time when you were not able to pay the mortgage or rent on time?: No    In the past 12 months, how many times have you moved where you were living?: 0    At any time in the past 12 months, were you homeless or living in a shelter (including now)?: No  Utilities: Not At Risk (02/19/2024)   Received from [facility] Utilities    Threatened with loss of utilities: No  Health Literacy: Not on file    FAMILY HISTORY: Family History  Problem Relation Age of Onset   Stroke Mother    Heart attack Mother    Breast cancer Sister 83   Stroke Sister    Heart attack Sister    Breast cancer Cousin        maternal side 60's    ALLERGIES:  is allergic to codeine, furosemide, penicillin v potassium, quinine, tramadol , and tylenol  [acetaminophen ].  MEDICATIONS:  Current Outpatient Medications  Medication Sig Dispense Refill   clotrimazole -betamethasone  (LOTRISONE ) cream Apply 1 application topically 2 (two) times daily as needed (itching).      erythromycin ophthalmic ointment SMARTSIG:In Eye(s)     ferrous sulfate  325 (65 FE) MG tablet Take 1 tablet (325 mg total) by mouth 2 (two) times daily with a meal. 60 tablet 1   hydrocortisone 2.5 % cream Apply 1 application topically 2 (two) times daily. (Apply to face as spot treatment)     ketoconazole (NIZORAL) 2 % cream Apply 1 application  topically 2 (two) times daily. (Apply to areas on face)     losartan  (COZAAR ) 50 MG tablet Take 50 mg by mouth every morning.      mometasone -formoterol  (DULERA ) 100-5 MCG/ACT AERO Inhale 2 puffs into the lungs 2 (two) times daily as needed for wheezing or shortness of breath.     ondansetron  (ZOFRAN -ODT) 4  MG disintegrating tablet Take 1 tablet (4 mg total) by mouth every 8 (eight) hours as needed for nausea or vomiting. 12 tablet 0   oxyCODONE  (OXY IR/ROXICODONE ) 5 MG immediate release tablet Take 1-2 tablets (5-10 mg total) by mouth every 4 (four) hours as needed for moderate pain. 60 tablet 0   pantoprazole  (PROTONIX ) 40 MG tablet Take 1 tablet (40 mg total) by mouth daily. 30 tablet 1   No current facility-administered medications for this visit.    PHYSICAL  EXAMINATION:  # positive heart murmur.   Vitals:   10/06/24 0917  BP: (!) 143/62  Pulse: 99  Resp: 20  SpO2: 100%    Filed Weights   10/06/24 0917  Weight: 189 lb 4.8 oz (85.9 kg)     Physical Exam Constitutional:      Comments: Obese.  In a wheelchair.  HENT:     Head: Normocephalic and atraumatic.     Mouth/Throat:     Pharynx: No oropharyngeal exudate.  Eyes:     Pupils: Pupils are equal, round, and reactive to light.  Cardiovascular:     Rate and Rhythm: Normal rate and regular rhythm.     Heart sounds: Murmur heard.  Pulmonary:     Effort: Pulmonary effort is normal. No respiratory distress.     Breath sounds: Normal breath sounds. No wheezing.  Abdominal:     General: Bowel sounds are normal. There is no distension.     Palpations: Abdomen is soft. There is no mass.     Tenderness: There is no abdominal tenderness. There is no guarding or rebound.  Musculoskeletal:        General: No tenderness. Normal range of motion.     Cervical back: Normal range of motion and neck supple.  Skin:    General: Skin is warm.  Neurological:     Mental Status: She is alert and oriented to person, place, and time.  Psychiatric:        Mood and Affect: Affect normal.     LABORATORY DATA:  I have reviewed the data as listed Lab Results  Component Value Date   WBC 8.2 10/06/2024   HGB 13.0 10/06/2024   HCT 40.0 10/06/2024   MCV 92.6 10/06/2024   PLT 241 10/06/2024   Recent Labs    12/12/23 0810 03/13/24 1247  10/06/24 0918  NA 139 140 140  K 4.0 4.5 3.8  CL 105 109 104  CO2 23 22 24   GLUCOSE 103* 133* 115*  BUN 16 15 12   CREATININE 1.17* 1.21* 1.02*  CALCIUM  9.0 8.5* 9.3  GFRNONAA 50* 48* 59*  PROT 7.3 6.6 7.1  ALBUMIN 4.1 3.7 4.2  AST 23 22 26   ALT 11 13 14   ALKPHOS 80 86 108  BILITOT 0.5 0.7 0.4     No results found.   Normocytic anemia #Anemia secondary to chronic kidney disease-[nephrology office; March 2021]-iron  saturation 15% recently noted to have worsening anemia;  JUNE 2023- Hb 9. Iron  studies pending-  On PO iron /day [no GI issues] - FEB 2025- bone marrow Biopsy:  Mildly hypercellular bone marrow with mild erythroid dyspoiesis, The overall marrow is mildly hypercellular for age.  On the aspirate smear slides there is mild nuclear irregularity and extremely rare budding to the erythroid precursors; however, this is insufficient for diagnosis of dysplasia.  In overall definitive etiology for the patient's anemia is not identified.  The above findings could be related to a stress dyserythropoiesis, but a MDS cannot be completely excluded. Cytogenetics-NEG; NGS- NEG. haptoglobin; retic count;  b12 ; MM panel; K/L lamda light chains- Negative.   # .Proceed with venofer - continue PO iron . Today Hb 13- continue IV Venofer .   # Etiology- chronic  GI loss. [facility]-- angio ectasis s/p APC; EGD-]  # CKD- III-GFR 40-50s-diabetes/hypertension-followed by nephrology [Dr.Lateef]- stable.   # DISPOSITION: # Venofer  # Follow up in 6  months- MD- labs- cbc/cmp/; LDH; iron  studies; ferritin; -- Possible venofer --Dr.B  All questions were answered. The patient knows to call the clinic with any problems, questions or concerns.    Cindy JONELLE Joe, MD 10/06/2024 9:48 AM

## 2024-12-03 ENCOUNTER — Ambulatory Visit: Admitting: Podiatry

## 2025-04-06 ENCOUNTER — Inpatient Hospital Stay: Admitting: Internal Medicine

## 2025-04-06 ENCOUNTER — Inpatient Hospital Stay
